# Patient Record
Sex: Male | Born: 1945 | Race: White | Hispanic: No | Marital: Married | Smoking: Former smoker
Health system: Southern US, Community
[De-identification: ages and names within clinical notes are randomized; demographics above are authoritative.]

## PROBLEM LIST (undated history)

## (undated) DIAGNOSIS — I10 Essential (primary) hypertension: Secondary | ICD-10-CM

## (undated) DIAGNOSIS — I629 Nontraumatic intracranial hemorrhage, unspecified: Secondary | ICD-10-CM

## (undated) DIAGNOSIS — D126 Benign neoplasm of colon, unspecified: Secondary | ICD-10-CM

## (undated) DIAGNOSIS — E7800 Pure hypercholesterolemia, unspecified: Secondary | ICD-10-CM

## (undated) DIAGNOSIS — I700 Atherosclerosis of aorta: Secondary | ICD-10-CM

## (undated) DIAGNOSIS — I639 Cerebral infarction, unspecified: Secondary | ICD-10-CM

## (undated) DIAGNOSIS — I701 Atherosclerosis of renal artery: Secondary | ICD-10-CM

## (undated) DIAGNOSIS — R931 Abnormal findings on diagnostic imaging of heart and coronary circulation: Secondary | ICD-10-CM

## (undated) HISTORY — PX: HERNIA REPAIR: SHX51

## (undated) HISTORY — PX: UMBILICAL HERNIA REPAIR: SHX196

## (undated) HISTORY — DX: Pure hypercholesterolemia, unspecified: E78.00

## (undated) HISTORY — DX: Atherosclerosis of renal artery: I70.1

## (undated) HISTORY — DX: Benign neoplasm of colon, unspecified: D12.6

## (undated) HISTORY — PX: APPENDECTOMY: SHX54

## (undated) HISTORY — DX: Abnormal findings on diagnostic imaging of heart and coronary circulation: R93.1

## (undated) HISTORY — DX: Essential (primary) hypertension: I10

## (undated) HISTORY — DX: Atherosclerosis of aorta: I70.0

---
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
## 2003-12-15 ENCOUNTER — Emergency Department (HOSPITAL_COMMUNITY): Admission: EM | Admit: 2003-12-15 | Discharge: 2003-12-16 | Payer: Self-pay | Admitting: Emergency Medicine

## 2005-12-17 ENCOUNTER — Ambulatory Visit: Payer: Self-pay | Admitting: Internal Medicine

## 2005-12-31 ENCOUNTER — Encounter (INDEPENDENT_AMBULATORY_CARE_PROVIDER_SITE_OTHER): Payer: Self-pay | Admitting: *Deleted

## 2005-12-31 ENCOUNTER — Ambulatory Visit: Payer: Self-pay | Admitting: Internal Medicine

## 2005-12-31 DIAGNOSIS — D126 Benign neoplasm of colon, unspecified: Secondary | ICD-10-CM

## 2005-12-31 HISTORY — PX: COLONOSCOPY W/ POLYPECTOMY: SHX1380

## 2005-12-31 HISTORY — DX: Benign neoplasm of colon, unspecified: D12.6

## 2008-04-28 ENCOUNTER — Ambulatory Visit: Payer: Self-pay | Admitting: Cardiology

## 2008-05-05 ENCOUNTER — Encounter: Payer: Self-pay | Admitting: Cardiology

## 2008-05-05 ENCOUNTER — Ambulatory Visit: Payer: Self-pay

## 2008-12-22 ENCOUNTER — Encounter (INDEPENDENT_AMBULATORY_CARE_PROVIDER_SITE_OTHER): Payer: Self-pay | Admitting: *Deleted

## 2009-08-05 ENCOUNTER — Telehealth (INDEPENDENT_AMBULATORY_CARE_PROVIDER_SITE_OTHER): Payer: Self-pay | Admitting: *Deleted

## 2010-04-26 NOTE — Progress Notes (Signed)
Summary: Schedule recall colonoscopy  Phone Note Outgoing Call Call back at Home Phone [phone]   Call placed by: Christie Nottingham CMA Duncan Dull),  Aug 05, 2009 11:23 AM Call placed to: Patient Summary of Call: Called pt to schedule recall colonoscopy and pt's mail box voicemail is full and could not leave a message and pt did not answer.  Initial call taken by: Christie Nottingham CMA Duncan Dull),  Aug 05, 2009 11:24 AM  Follow-up for Phone Call        no answer and voicemail box is full.  Follow-up by: Christie Nottingham CMA Duncan Dull),  Aug 12, 2009 10:19 AM

## 2010-08-08 NOTE — Assessment & Plan Note (Signed)
[facility]                            CARDIOLOGY OFFICE NOTE   NAME:MILLSKegan, Mckeithan                          MRN:          [id number]  DATE:04/28/2008                            DOB:          1946/02/23    PRIMARY CARE PHYSICIAN:  Mary-Margaret Daphine Deutscher, Nurse Practitioner.   REASON FOR PRESENTATION:  Evaluate the patient with an abnormal exercise  treadmill test.   HISTORY OF PRESENT ILLNESS:  The patient is a pleasant 65 year old  gentleman with cardiovascular risk factors but no prior cardiac disease.  He was screened with a routine exercise treadmill test recently.  He was  able to go for 8 minutes and 21 seconds with a heart rate of 140.  He  had an accelerated blood pressure response with a maximum 200/80.  He  did have inferior ST depression leads II, III, and aVF of about 1-2 mm.  It was horizontal.  I would interpret this as a positive study.  It  resolved fairly quickly in recovery.  This was a positive adequate  trial.  He did not have any chest pain with this.   He states he is active around his yard.  He caries heavy objects.  He  chases bird dogs.  With this level of activity, he denies any chest  pressure, neck or arm discomfort.  He does not have any palpitations,  presyncope, or syncope.  He has had no PND or orthopnea.  He does not  exercise routinely.   PAST MEDICAL HISTORY:  1. Hypertension, recently diagnosed.  2. Hyperlipidemia x10 years.  3. Small hemorrhoids.  4. Diverticulosis.   PAST SURGICAL HISTORY:  1. Appendectomy.  2. Umbilical hernia surgery as a child.   ALLERGIES:  None.   MEDICATIONS:  1. Lipitor 20 mg daily.  2. Lisinopril 10 mg daily.  3. Vitamin E.  4. Aspirin 81 mg daily.  5. Omega 3.  6. Flaxseed oil.   SOCIAL HISTORY:  The patient is retired.  He is married.  He has 2  children.  He does drink social alcohol.  He was a one-pack-per-day  smoker for 14 years, but quit 30 years ago.   FAMILY  HISTORY:  Noncontributory for early coronary artery disease.  Both of his parents died in their 13s.   REVIEW OF SYSTEMS:  As stated in the HPI, and positive for some mild  joint pains.  Negative for all other systems.   PHYSICAL EXAMINATION:  GENERAL:  The patient is pleasant and in no  distress.  VITAL SIGNS:  Blood pressure 130/80, heart rate 67 and regular, weight  173 pounds, and body mass index 26.  HEENT:  Eyelids unremarkable; pupils equal, round, and react to light;  fundi not well visualized, oral mucosa unremarkable.  NECK:  No jugular venous distention at 45 degrees; carotid upstroke  brisk and symmetric; no bruits, no thyromegaly.  LYMPHATICS:  No cervical, axillary, or inguinal adenopathy.  LUNGS:  Clear to auscultation bilaterally.  BACK:  No costovertebral angle tenderness.  CHEST:  Unremarkable.  HEART:  PMI not displaced or sustained; S1  and S2 within normal limits;  no S3, no S4; 2/6 apical systolic murmur heard at the left lower sternal  border and radiating slightly at the aortic outflow tract, early  peaking, no diastolic murmurs.  ABDOMEN:  Flat; positive bowel sounds, normal in frequency and pitch; no  bruits, no rebound, no guarding; no midline pulsatile mass; no  hepatomegaly, no splenomegaly.  SKIN:  No rashes.  No nodules.  EXTREMITIES:  Pulses 2+ throughout; no edema, no cyanosis, no clubbing.  NEURO:  Oriented to person, place, and time; cranial nerves II through  XII grossly intact, motor grossly intact.   EKG; sinus rhythm, rate 65, axis within normal limits, intervals within  normal limits, early transition lead V2, minimal voltage criteria for  left ventricular hypertrophy, no acute ST-T wave changes.   ASSESSMENT AND PLAN:  1. Abnormal treadmill test.  The patient did have a positive treadmill      test.  This could have been related to the hypertensive response.      However, I cannot exclude obstructive coronary artery disease.       Therefore, he needs the added sensitivity of stress perfusion      imaging.  I do not think he needs a cardiac catheterization at this      point.  He and I discussed this.  He agrees to proceed and can have      an exercise Myoview.  2. Murmur.  The patient does have a murmur, which is probably some      mild aortic stenosis or sclerosis.  I would not suspect strongly      obstructive valvular disease, though he does have some voltage      criteria for hypertrophy with only a minimal history of      hypertension.  To exclude more significant aortic stenosis, he      should get an echocardiogram and we will arrange this.  3. Hypertension.  His blood pressure is controlled on the meds as      listed.  4. Dyslipidemia per Mary-Margaret Daphine Deutscher with a goal LDL less than 100      and HDL greater      than 40 being a very aggressive goal in this gentleman.  5. Followup.  We will see him based on the results of the above.     Rollene Rotunda, MD, [facility]  Electronically Signed    JH/MedQ  DD: 04/28/2008  DT: 04/29/2008  Job #: [id number]   cc:   Bennie Pierini, NP

## 2011-07-17 ENCOUNTER — Encounter: Payer: Self-pay | Admitting: Internal Medicine

## 2011-07-19 ENCOUNTER — Encounter: Payer: Self-pay | Admitting: Internal Medicine

## 2011-07-19 ENCOUNTER — Ambulatory Visit (INDEPENDENT_AMBULATORY_CARE_PROVIDER_SITE_OTHER): Payer: Medicare Other | Admitting: Internal Medicine

## 2011-07-19 VITALS — BP 130/72 | HR 52 | Ht 68.0 in | Wt 158.4 lb

## 2011-07-19 DIAGNOSIS — Z860100 Personal history of colon polyps, unspecified: Secondary | ICD-10-CM

## 2011-07-19 DIAGNOSIS — K629 Disease of anus and rectum, unspecified: Secondary | ICD-10-CM

## 2011-07-19 DIAGNOSIS — K6289 Other specified diseases of anus and rectum: Secondary | ICD-10-CM

## 2011-07-19 DIAGNOSIS — Z1211 Encounter for screening for malignant neoplasm of colon: Secondary | ICD-10-CM

## 2011-07-19 MED ORDER — PEG-KCL-NACL-NASULF-NA ASC-C 100 G PO SOLR: 1.0000 | Freq: Once | ORAL | Status: DC

## 2011-07-19 NOTE — Progress Notes (Signed)
Subjective:    Patient ID: Peter York, male    DOB: 09-21-1945, 66 y.o.   MRN: [id number]  Referred by: Bennie Pierini, NP  HPI This 66 year old white man presents for evaluation of a nodule in the perianal area. 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 He gets a bed sore but is not very painful. He says his in the left buttock area. He was evaluated by primary care and sent here. They also palpated a nontender nodular area in the rectal area with Hemoccult-negative stool. He has noted no discharge from this. There is no change in bowel habits. No bleeding. Most of the time it does not bother him. He does think that it is probably growing.  He does have a history of adenomatous colon polyps in 2007. He does recall receiving a letter for followup but has not yet done so. No Known Allergies No outpatient prescriptions prior to visit.   Past Medical History  Diagnosis Date  . Adenomatous colon polyp 12/31/2005  . Diabetes mellitus   . Hypercholesterolemia   . Hypertension    Past Surgical History  Procedure Date  . Colonoscopy w/ polypectomy 12/31/2005    Dr. Stan Head  . Appendectomy   . Umbilical hernia repair    History   Social History  . Marital Status: Married    Spouse Name: N/A    Number of Children: 3  .     Occupational History  . retired    Social History Main Topics  . Smoking status: Former Games developer  . Smokeless tobacco: Never Used  . Alcohol Use: Yes     socially  . Drug Use: No  . Sexually Active: None     Family History  Problem Relation Age of Onset  . Colon cancer Father 61  . Colon polyps Father   . Diabetes Daughter   . Heart attack Maternal Uncle     X 4        Review of Systems Positive for those things mentioned above. He does wear eyeglasses. All other review of systems are negative.    Objective:   Physical Exam General:  Well-developed,  well-nourished and in no acute distress Eyes:  anicteric. ENT:   Mouth and posterior pharynx free of lesions.  Neck:   supple w/o thyromegaly or mass.  Lungs: Clear to auscultation bilaterally. Heart:  S1S2, no rubs, murmurs, gallops. Abdomen:  soft, non-tender, no hepatosplenomegaly, hernia, or mass and BS+.  Rectal: Anoderm - slight erythema and some anal tags. In left buttock at about 4-5 o'clock in left lateral there is about a 1-2 cm rubbery and nontender subcutaneous lesion. Somewhat mobile. No    fistulous openings seen. No fluctuance. DRE without mas, there is brown stool, normal resting tone. Normal prostate. Lymph:  no cervical or supraclavicular adenopathy. Extremities:   no edema Skin   no rash. Neuro:  A&O x 3.  Psych:  appropriate mood and  Affect.   Data Reviewed: Previous colonoscopy and pathology reports. 04/25/2011 metabolic panel, basic showing a glucose 149 otherwise normal. Liver function test 04/23/2011 with albumin 3.2 total protein 5.5 otherwise normal. His potassium was 6 that day glucose 144 on the remainder of the metabolic panel. Office note reviewed from 07/17/2011.       Assessment & Plan:   1. Perianal lesion   Submucosal lesion, unclear etiology. This is new, he thinks is growing so  it needs further evaluation. I will refer him for surgical evaluation, he could need imaging with MR versus resection.   2. Personal history of colonic polyps   3 adenomatous polyps removed 2007. A fourth polyp was not recovered. We'll schedule repeat colonoscopy for screening and surveillance. The risks and benefits as well as alternatives of endoscopic procedure(s) have been discussed and reviewed. All questions answered. The patient agrees to proceed.   3. Special screening for malignant neoplasms, colon    His elderly father, had colon cancer age 40.  I appreciate the opportunity to care for this patient.   CC: Bennie Pierini, NP

## 2011-07-19 NOTE — Patient Instructions (Signed)
You have been scheduled for a colonoscopy. Please follow written instructions given to you at your visit today.  Please pick up your prep kit at the pharmacy within the next 1-3 days.  You have been scheduled for an appointment with ____________ at [facility]. Your appointment is on _______ at ____. Please arrive at __________ for registration. Make certain to bring a list of current medications, including any over the counter medications or vitamins. Also bring your co-pay if you have one as well as your insurance cards. [facility] is located at [address], [address]. Should you need to reschedule your appointment, please contact them at [phone].

## 2011-07-20 ENCOUNTER — Encounter: Payer: Self-pay | Admitting: Internal Medicine

## 2011-08-10 ENCOUNTER — Ambulatory Visit (INDEPENDENT_AMBULATORY_CARE_PROVIDER_SITE_OTHER): Payer: Medicare Other | Admitting: Surgery

## 2011-08-14 ENCOUNTER — Encounter: Payer: Self-pay | Admitting: Internal Medicine

## 2011-08-14 ENCOUNTER — Ambulatory Visit (AMBULATORY_SURGERY_CENTER): Payer: Medicare Other | Admitting: Internal Medicine

## 2011-08-14 VITALS — BP 143/84 | HR 58 | Temp 97.4°F | Resp 16 | Ht 68.0 in | Wt 158.0 lb

## 2011-08-14 DIAGNOSIS — K6289 Other specified diseases of anus and rectum: Secondary | ICD-10-CM

## 2011-08-14 DIAGNOSIS — D126 Benign neoplasm of colon, unspecified: Secondary | ICD-10-CM

## 2011-08-14 DIAGNOSIS — Z860100 Personal history of colon polyps, unspecified: Secondary | ICD-10-CM

## 2011-08-14 DIAGNOSIS — Z1211 Encounter for screening for malignant neoplasm of colon: Secondary | ICD-10-CM

## 2011-08-14 MED ORDER — SODIUM CHLORIDE 0.9 % IV SOLN: 500.0000 mL | INTRAVENOUS | Status: DC

## 2011-08-14 NOTE — Patient Instructions (Signed)

## 2011-08-14 NOTE — Progress Notes (Signed)
Patient did not experience any of the following events: a burn prior to discharge; a fall within the facility; wrong site/side/patient/procedure/implant event; or a hospital transfer or hospital admission upon discharge from the facility. (G8907) Patient did not have preoperative order for IV antibiotic SSI prophylaxis. (G8918)  

## 2011-08-14 NOTE — Op Note (Signed)
[facility] [address] [place], [place]  [address]  COLONOSCOPY PROCEDURE REPORT  PATIENT:  Peter York, Peter York  MR#:  [id number] BIRTHDATE:  March 04, 1946, 66 yrs. old  GENDER:  male ENDOSCOPIST:  Iva Boop, MD, [facility]  PROCEDURE DATE:  08/14/2011 PROCEDURE:  Colonoscopy with biopsy and snare polypectomy ASA CLASS:  Class II INDICATIONS:  surveillance and high-risk screening, history of pre-cancerous (adenomatous) colon polyps 3-4 adenomas 2007 index, largest 8 mm MEDICATIONS:   These medications were titrated to patient response per physician's verbal order, Fentanyl 50 mcg IV, Versed 7 mg IV  DESCRIPTION OF PROCEDURE:   After the risks benefits and alternatives of the procedure were thoroughly explained, informed consent was obtained.  Digital rectal exam was performed and revealed no rectal masses, normal prostate and a perianal lesion. Subcutaneous perianal nodule on left. Firm, non-tender, at about 7 o'clock in left lateral decubitus position. The LB CF-[id number] endoscope was introduced through the anus and advanced to the cecum, which was identified by both the appendix and ileocecal valve, without limitations.  The quality of the prep was excellent, using MoviPrep.  The instrument was then slowly withdrawn as the colon was fully examined. <<PROCEDUREIMAGES>>  FINDINGS:  Four polyps were found. Cecum 2-3 mm cold biopsy removal, cecum 8 mm snare cautery removal, hepatic flexure 10 mm snare cautery removal, transverse colon 7 mm snare cautery removal. All sent to pathology.  Moderate diverticulosis was found in the sigmoid colon.  This was otherwise a normal examination of the colon. Includes right colon retroflexion.   Retroflexed views in the rectum revealed internal hemorrhoids.    The time to cecum = 3:20 minutes. The scope was then withdrawn in 19:16 minutes from the cecum and the procedure completed. COMPLICATIONS:  None ENDOSCOPIC IMPRESSION: 1) Four  polyps removed, largest 10mm 2) Moderate diverticulosis in the sigmoid colon 3) Internal hemorrhoids 4) Otherwise normal examination, excellent prep 5) Perianal submucosal nodule RECOMMENDATIONS: 1) Hold aspirin, aspirin products, and anti-inflammatory medication for 2 weeks. 2) Await Dr. Rosezena Sensor evaluation of perianal lesion REPEAT EXAM:  In for Colonoscopy, pending biopsy results.  Iva Boop, MD, [facility]  CC:  Bennie Pierini, NP and The Patient  n. eSIGNED:   Iva Boop at 08/14/2011 02:51 PM  Melina Fiddler, [id number]

## 2011-08-15 ENCOUNTER — Telehealth: Payer: Self-pay | Admitting: *Deleted

## 2011-08-15 NOTE — Telephone Encounter (Signed)
  Follow up Call-  Call back number 08/14/2011  Post procedure Call Back phone  # [phone]  Permission to leave phone message Yes     Patient questions:  Do you have a fever, pain , or abdominal swelling? no Pain Score  0 *  Have you tolerated food without any problems? yes  Have you been able to return to your normal activities? yes  Do you have any questions about your discharge instructions: Diet   no Medications  no Follow up visit  no  Do you have questions or concerns about your Care? no  Actions: * If pain score is 4 or above: No action needed, pain <4.

## 2011-08-16 ENCOUNTER — Encounter (INDEPENDENT_AMBULATORY_CARE_PROVIDER_SITE_OTHER): Payer: Self-pay | Admitting: Surgery

## 2011-08-16 ENCOUNTER — Ambulatory Visit (INDEPENDENT_AMBULATORY_CARE_PROVIDER_SITE_OTHER): Payer: Medicare Other | Admitting: Surgery

## 2011-08-16 VITALS — BP 102/62 | HR 60 | Temp 97.6°F | Resp 14 | Ht 68.0 in | Wt 155.2 lb

## 2011-08-16 DIAGNOSIS — R198 Other specified symptoms and signs involving the digestive system and abdomen: Secondary | ICD-10-CM

## 2011-08-16 DIAGNOSIS — K6289 Other specified diseases of anus and rectum: Secondary | ICD-10-CM

## 2011-08-16 NOTE — Progress Notes (Signed)
Patient ID: Peter York, male   DOB: 1945-08-08, 66 y.o.   MRN: [id number]  Chief Complaint  Patient presents with  . New Evaluation    New Pt. Eval of Pre-Anal Lesions    HPI Peter York is a 66 y.o. male.  Asked to see pt at request of Dr Leone Payor due to anal mass.  Present for 2 months.  No redness or drainage.  Getting a little larger.  No rectal discharge.   HPI  Past Medical History  Diagnosis Date  . Adenomatous colon polyp 12/31/2005  . Diabetes mellitus   . Hypercholesterolemia   . Hypertension     Past Surgical History  Procedure Date  . Colonoscopy w/ polypectomy 12/31/2005    Dr. Stan Head  . Appendectomy   . Umbilical hernia repair     Family History  Problem Relation Age of Onset  . Colon cancer Father 8  . Colon polyps Father   . Cancer Father     Colon  . Diabetes Daughter   . Heart attack Maternal Uncle     X 4  . Heart disease Maternal Uncle   . Heart disease Maternal Uncle   . Heart disease Maternal Uncle     Social History History  Substance Use Topics  . Smoking status: Former Smoker    Quit date: 03/26/1978  . Smokeless tobacco: Never Used  . Alcohol Use: Yes     socially    No Known Allergies  Current Outpatient Prescriptions  Medication Sig Dispense Refill  . AMBULATORY NON FORMULARY MEDICATION Beta Prostate  Take two capsule by mouth twice a day      . aspirin 81 MG tablet Take 81 mg by mouth daily.      . benazepril (LOTENSIN) 40 MG tablet Take 40 mg by mouth daily.      . furosemide (LASIX) 20 MG tablet Take 20 mg by mouth daily.      . metFORMIN (GLUCOPHAGE) 500 MG tablet Take one tablet by mouth in the morning and take two tablets by mouth at bedtime.      . rosuvastatin (CRESTOR) 20 MG tablet Take 20 mg by mouth daily.      . vitamin E 400 UNIT capsule Take 400 Units by mouth daily.        Review of Systems Review of Systems  Constitutional: Negative for fever, chills and unexpected weight change.  HENT: Negative  for hearing loss, congestion, sore throat, trouble swallowing and voice change.   Eyes: Negative for visual disturbance.  Respiratory: Negative for cough and wheezing.   Cardiovascular: Negative for chest pain, palpitations and leg swelling.  Gastrointestinal: Negative for nausea, vomiting, abdominal pain, diarrhea, constipation, blood in stool, abdominal distention, anal bleeding and rectal pain.  Genitourinary: Negative for hematuria and difficulty urinating.  Musculoskeletal: Negative for arthralgias.  Skin: Negative for rash and wound.  Neurological: Negative for seizures, syncope, weakness and headaches.  Hematological: Negative for adenopathy. Does not bruise/bleed easily.  Psychiatric/Behavioral: Negative for confusion.    Blood pressure 102/62, pulse 60, temperature 97.6 F (36.4 C), temperature source Temporal, resp. rate 14, height 5\' 8"  (1.727 m), weight 155 lb 3.2 oz (70.398 kg).  Physical Exam Physical Exam  Constitutional: He is oriented to person, place, and time. He appears well-developed and well-nourished.  HENT:  Head: Normocephalic and atraumatic.  Eyes: EOM are normal. Pupils are equal, round, and reactive to light.  Neck: Normal range of motion. Neck supple.  Cardiovascular: Normal  rate and regular rhythm.   Pulmonary/Chest: Effort normal and breath sounds normal.  Abdominal: Soft. Bowel sounds are normal.  Genitourinary:     Neurological: He is alert and oriented to person, place, and time.  Skin: Skin is warm and dry.  Psychiatric: He has a normal mood and affect. His behavior is normal. Judgment and thought content normal.    Data Reviewed Dr Leone Payor note  Assessment    Perianal mass 3cm    Plan    Excision of mass. The procedure has been discussed with the patient.  Alternative therapies have been discussed with the patient.  Operative risks include bleeding,  Infection,  Organ injury,  Nerve injury,  Blood vessel injury,  DVT,  Pulmonary  embolism,  Death,  And possible reoperation.  Medical management risks include worsening of present situation.  The success of the procedure is 50 -90 % at treating patients symptoms.  The patient understands and agrees to proceed.       Jenai Scaletta A. 08/16/2011, 11:05 AM

## 2011-08-16 NOTE — Patient Instructions (Signed)
[facility] _______[facility] ° °RECTAL SURGERY POST OP INSTRUCTIONS: POST OP INSTRUCTIONS ° °Always review your discharge instruction sheet given to you by the facility where your surgery was performed. °IF YOU HAVE DISABILITY OR FAMILY LEAVE FORMS, YOU MUST BRING THEM TO THE OFFICE FOR PROCESSING.   °DO NOT GIVE THEM TO YOUR DOCTOR. ° °1. A  prescription for pain medication may be given to you upon discharge.  Take your pain medication as prescribed, if needed.  If narcotic pain medicine is not needed, then you may take acetaminophen (Tylenol) or ibuprofen (Advil) as needed. °2. Take your usually prescribed medications unless otherwise directed. °3. If you need a refill on your pain medication, please contact your pharmacy.  They will contact our office to request authorization. Prescriptions will not be filled after 5 pm or on week-ends. °4. You should follow a light diet the first 48 hours after arrival home, such as soup and crackers, etc.  Be sure to include lots of fluids daily.  Resume your normal diet 2-3 days after surgery.. °5. Most patients will experience some swelling and discomfort in the rectal area. Ice packs, reclining and warm tub soaks will help.  Swelling and discomfort can take several days to resolve.  °6. It is common to experience some constipation if taking pain medication after surgery.  Increasing fluid intake and taking a stool softener (such as Colace) will usually help or prevent this problem from occurring.  A mild laxative (Milk of Magnesia or Miralax) should be taken according to package directions if there are no bowel movements after 48 hours. °7. Unless discharge instructions indicate otherwise, leave your bandage dry and in place for 24 hours, or remove the bandage if you have a bowel movement. You may notice a small amount of bleeding with bowel movements for the first few days. You may have some packing in the rectum which will come out over the first day or two. You  will need to wear an absorbent pad or soft cotton gauze in your underwear until the drainage stops.it. °8. ACTIVITIES:  You may resume regular (light) daily activities beginning the next day--such as daily self-care, walking, climbing stairs--gradually increasing activities as tolerated.  You may have sexual intercourse when it is comfortable.  Refrain from any heavy lifting or straining until approved by your doctor. °a. You may drive when you are no longer taking prescription pain medication, you can comfortably wear a seatbelt, and you can safely maneuver your car and apply brakes. °b. RETURN TO WORK: : ____________________ °c.  °9. You should see your doctor in the office for a follow-up appointment approximately 2-3 weeks after your surgery.  Make sure that you call for this appointment within a day or two after you arrive home to insure a convenient appointment time. °10. OTHER INSTRUCTIONS:  __________________________________________________________________________________________________________________________________________________________________________________________  °WHEN TO CALL YOUR DOCTOR: °1. Fever over 101.0 °2. Inability to urinate °3. Nausea and/or vomiting °4. Extreme swelling or bruising °5. Continued bleeding from rectum. °6. Increased pain, redness, or drainage from the incision °7. Constipation ° °The clinic staff is available to answer your questions during regular business hours.  Please don’t hesitate to call and ask to speak to one of the nurses for clinical concerns.  If you have a medical emergency, go to the nearest emergency room or call 911.  A surgeon from [facility] is always on call at the hospital ° ° °[address], [address], [place], [place]  [address] ? °   [address] [address], [place], [place]   [address] °[phone] ? [phone] ? FAX [phone] °Web site: [email or web address] ° °

## 2011-08-22 ENCOUNTER — Encounter: Payer: Self-pay | Admitting: Internal Medicine

## 2011-08-22 NOTE — Progress Notes (Signed)
Quick Note:  3 adenomas largest 1 cm Repeat colonoscopy about 07/2014 ______

## 2011-09-26 NOTE — Progress Notes (Signed)
To come in for cxr-ekg,cmet Had an abn stress test-sent to see dr hochrein 5/12-stress and echo normal-no further workup

## 2011-10-01 ENCOUNTER — Encounter (HOSPITAL_BASED_OUTPATIENT_CLINIC_OR_DEPARTMENT_OTHER)
Admission: RE | Admit: 2011-10-01 | Discharge: 2011-10-01 | Disposition: A | Discharge status: Home / Self Care | Payer: Medicare Other | Source: Ambulatory Visit | Attending: Surgery | Admitting: Surgery

## 2011-10-01 ENCOUNTER — Ambulatory Visit
Admission: RE | Admit: 2011-10-01 | Discharge: 2011-10-01 | Disposition: A | Discharge status: Home / Self Care | Payer: Medicare Other | Source: Ambulatory Visit | Attending: Surgery | Admitting: Surgery

## 2011-10-01 LAB — COMPREHENSIVE METABOLIC PANEL
Albumin: 3.1 g/dL — ABNORMAL LOW (ref 3.5–5.2)
Alkaline Phosphatase: 57 U/L (ref 39–117)
BUN: 16 mg/dL (ref 6–23)
CO2: 30 mEq/L (ref 19–32)
Chloride: 101 mEq/L (ref 96–112)
Creatinine, Ser: 1.05 mg/dL (ref 0.50–1.35)
GFR calc Af Amer: 83 mL/min — ABNORMAL LOW (ref >90)
GFR calc non Af Amer: 72 mL/min — ABNORMAL LOW (ref >90)
Glucose, Bld: 204 mg/dL — ABNORMAL HIGH (ref 70–99)
Potassium: 4.3 mEq/L (ref 3.5–5.1)
Total Bilirubin: 0.5 mg/dL (ref 0.3–1.2)

## 2011-10-02 ENCOUNTER — Ambulatory Visit (HOSPITAL_BASED_OUTPATIENT_CLINIC_OR_DEPARTMENT_OTHER): Payer: Medicare Other | Admitting: Anesthesiology

## 2011-10-02 ENCOUNTER — Encounter (HOSPITAL_BASED_OUTPATIENT_CLINIC_OR_DEPARTMENT_OTHER): Payer: Self-pay | Admitting: Anesthesiology

## 2011-10-02 ENCOUNTER — Ambulatory Visit (HOSPITAL_BASED_OUTPATIENT_CLINIC_OR_DEPARTMENT_OTHER)
Admission: RE | Admit: 2011-10-02 | Discharge: 2011-10-02 | Disposition: A | Discharge status: Home / Self Care | Payer: Medicare Other | Source: Ambulatory Visit | Attending: Surgery | Admitting: Surgery

## 2011-10-02 ENCOUNTER — Encounter (HOSPITAL_BASED_OUTPATIENT_CLINIC_OR_DEPARTMENT_OTHER)
Admission: RE | Disposition: A | Discharge status: Home / Self Care | Payer: Self-pay | Source: Ambulatory Visit | Attending: Surgery

## 2011-10-02 DIAGNOSIS — D128 Benign neoplasm of rectum: Secondary | ICD-10-CM

## 2011-10-02 DIAGNOSIS — E119 Type 2 diabetes mellitus without complications: Secondary | ICD-10-CM | POA: Insufficient documentation

## 2011-10-02 DIAGNOSIS — E7800 Pure hypercholesterolemia, unspecified: Secondary | ICD-10-CM | POA: Insufficient documentation

## 2011-10-02 DIAGNOSIS — K6289 Other specified diseases of anus and rectum: Secondary | ICD-10-CM | POA: Insufficient documentation

## 2011-10-02 DIAGNOSIS — Z860100 Personal history of colon polyps, unspecified: Secondary | ICD-10-CM | POA: Insufficient documentation

## 2011-10-02 DIAGNOSIS — I10 Essential (primary) hypertension: Secondary | ICD-10-CM | POA: Insufficient documentation

## 2011-10-02 DIAGNOSIS — D129 Benign neoplasm of anus and anal canal: Secondary | ICD-10-CM

## 2011-10-02 DIAGNOSIS — Z01812 Encounter for preprocedural laboratory examination: Secondary | ICD-10-CM | POA: Insufficient documentation

## 2011-10-02 HISTORY — PX: ANAL FISTULECTOMY: SHX1139

## 2011-10-02 LAB — GLUCOSE, CAPILLARY
Glucose-Capillary: 127 mg/dL — ABNORMAL HIGH (ref 70–99)
Glucose-Capillary: 98 mg/dL (ref 70–99)

## 2011-10-02 LAB — POCT HEMOGLOBIN-HEMACUE: Hemoglobin: 12.1 g/dL — ABNORMAL LOW (ref 13.0–17.0)

## 2011-10-02 SURGERY — FISTULECTOMY, ANAL
Anesthesia: General | Site: Anus | Wound class: Contaminated

## 2011-10-02 MED ORDER — OXYCODONE-ACETAMINOPHEN 5-325 MG PO TABS: 1.0000 | ORAL_TABLET | ORAL | Status: AC | PRN

## 2011-10-02 MED ORDER — ONDANSETRON HCL 4 MG/2ML IJ SOLN
INTRAMUSCULAR | Status: DC | PRN
  Administered 2011-10-02: 4 mg via INTRAVENOUS

## 2011-10-02 MED ORDER — PROPOFOL 10 MG/ML IV EMUL
INTRAVENOUS | Status: DC | PRN
  Administered 2011-10-02: 200 mg via INTRAVENOUS

## 2011-10-02 MED ORDER — FENTANYL CITRATE 0.05 MG/ML IJ SOLN
INTRAMUSCULAR | Status: DC | PRN
  Administered 2011-10-02 (×2): 50 ug via INTRAVENOUS

## 2011-10-02 MED ORDER — GLYCOPYRROLATE 0.2 MG/ML IJ SOLN
INTRAMUSCULAR | Status: DC | PRN
  Administered 2011-10-02: 0.2 mg via INTRAVENOUS

## 2011-10-02 MED ORDER — SODIUM CHLORIDE 0.9 % IJ SOLN
INTRAMUSCULAR | Status: DC | PRN
  Administered 2011-10-02: 10 mL via INTRAVENOUS

## 2011-10-02 MED ORDER — OXYCODONE HCL 5 MG PO TABS: 5.0000 mg | ORAL_TABLET | Freq: Once | ORAL | Status: DC | PRN

## 2011-10-02 MED ORDER — CEFAZOLIN SODIUM-DEXTROSE 2-3 GM-% IV SOLR
2.0000 g | INTRAVENOUS | Status: AC
  Administered 2011-10-02: 2 g via INTRAVENOUS

## 2011-10-02 MED ORDER — HYDROMORPHONE HCL PF 1 MG/ML IJ SOLN: 0.2500 mg | INTRAMUSCULAR | Status: DC | PRN

## 2011-10-02 MED ORDER — BUPIVACAINE LIPOSOME 1.3 % IJ SUSP
INTRAMUSCULAR | Status: DC | PRN
  Administered 2011-10-02: 10 mL

## 2011-10-02 MED ORDER — METOCLOPRAMIDE HCL 5 MG/ML IJ SOLN: 10.0000 mg | Freq: Once | INTRAMUSCULAR | Status: DC | PRN

## 2011-10-02 MED ORDER — DEXAMETHASONE SODIUM PHOSPHATE 4 MG/ML IJ SOLN
INTRAMUSCULAR | Status: DC | PRN
  Administered 2011-10-02: 4 mg via INTRAVENOUS

## 2011-10-02 MED ORDER — EPHEDRINE SULFATE 50 MG/ML IJ SOLN
INTRAMUSCULAR | Status: DC | PRN
  Administered 2011-10-02: 10 mg via INTRAVENOUS

## 2011-10-02 MED ORDER — LIDOCAINE HCL (CARDIAC) 20 MG/ML IV SOLN
INTRAVENOUS | Status: DC | PRN
  Administered 2011-10-02: 60 mg via INTRAVENOUS

## 2011-10-02 MED ORDER — MIDAZOLAM HCL 5 MG/5ML IJ SOLN
INTRAMUSCULAR | Status: DC | PRN
  Administered 2011-10-02: 2 mg via INTRAVENOUS

## 2011-10-02 MED ORDER — ACETAMINOPHEN 10 MG/ML IV SOLN: 1000.0000 mg | Freq: Once | INTRAVENOUS | Status: DC

## 2011-10-02 MED ORDER — METOCLOPRAMIDE HCL 5 MG/ML IJ SOLN
INTRAMUSCULAR | Status: DC | PRN
  Administered 2011-10-02: 10 mg via INTRAVENOUS

## 2011-10-02 MED ORDER — LACTATED RINGERS IV SOLN
INTRAVENOUS | Status: DC
  Administered 2011-10-02 (×3): via INTRAVENOUS

## 2011-10-02 SURGICAL SUPPLY — 38 items
BLADE SURG 15 STRL LF DISP TIS (BLADE) ×1 IMPLANT
BLADE SURG 15 STRL SS (BLADE) ×2
BRIEF STRETCH FOR OB PAD LRG (UNDERPADS AND DIAPERS) ×1 IMPLANT
CANISTER SUCTION 1200CC (MISCELLANEOUS) ×2 IMPLANT
CLOTH BEACON ORANGE TIMEOUT ST (SAFETY) ×2 IMPLANT
COVER MAYO STAND STRL (DRAPES) IMPLANT
DECANTER SPIKE VIAL GLASS SM (MISCELLANEOUS) ×1 IMPLANT
DRAPE UTILITY XL STRL (DRAPES) ×2 IMPLANT
DRSG PAD ABDOMINAL 8X10 ST (GAUZE/BANDAGES/DRESSINGS) ×2 IMPLANT
ELECT REM PT RETURN 9FT ADLT (ELECTROSURGICAL) ×2
ELECTRODE REM PT RTRN 9FT ADLT (ELECTROSURGICAL) IMPLANT
GAUZE PACKING IODOFORM 1/4X5 (PACKING) ×1 IMPLANT
GAUZE SPONGE 4X4 12PLY STRL LF (GAUZE/BANDAGES/DRESSINGS) ×2 IMPLANT
GLOVE BIO SURGEON STRL SZ7 (GLOVE) ×1 IMPLANT
GLOVE BIOGEL PI IND STRL 7.0 (GLOVE) IMPLANT
GLOVE BIOGEL PI IND STRL 8 (GLOVE) ×1 IMPLANT
GLOVE BIOGEL PI INDICATOR 7.0 (GLOVE) ×1
GLOVE BIOGEL PI INDICATOR 8 (GLOVE) ×1
GLOVE ECLIPSE 8.0 STRL XLNG CF (GLOVE) ×2 IMPLANT
GOWN PREVENTION PLUS XLARGE (GOWN DISPOSABLE) ×3 IMPLANT
NDL HYPO 25X1 1.5 SAFETY (NEEDLE) ×1 IMPLANT
NEEDLE HYPO 25X1 1.5 SAFETY (NEEDLE) ×2 IMPLANT
PACK BASIN DAY SURGERY FS (CUSTOM PROCEDURE TRAY) ×2 IMPLANT
PACK LITHOTOMY IV (CUSTOM PROCEDURE TRAY) ×2 IMPLANT
PENCIL BUTTON HOLSTER BLD 10FT (ELECTRODE) ×1 IMPLANT
SHEET MEDIUM DRAPE 40X70 STRL (DRAPES) IMPLANT
SPONGE SURGIFOAM ABS GEL 12-7 (HEMOSTASIS) IMPLANT
SURGILUBE 2OZ TUBE FLIPTOP (MISCELLANEOUS) ×2 IMPLANT
SUT CHROMIC 3 0 SH 27 (SUTURE) IMPLANT
SYR CONTROL 10ML LL (SYRINGE) ×2 IMPLANT
TOWEL OR 17X24 6PK STRL BLUE (TOWEL DISPOSABLE) ×3 IMPLANT
TOWEL OR NON WOVEN STRL DISP B (DISPOSABLE) ×2 IMPLANT
TRAY DSU PREP LF (CUSTOM PROCEDURE TRAY) ×2 IMPLANT
TRAY PROCTOSCOPIC FIBER OPTIC (SET/KITS/TRAYS/PACK) IMPLANT
TUBE CONNECTING 20X1/4 (TUBING) ×2 IMPLANT
UNDERPAD 30X30 INCONTINENT (UNDERPADS AND DIAPERS) ×2 IMPLANT
WATER STERILE IRR 1000ML POUR (IV SOLUTION) IMPLANT
YANKAUER SUCT BULB TIP NO VENT (SUCTIONS) ×2 IMPLANT

## 2011-10-02 NOTE — Transfer of Care (Signed)
Immediate Anesthesia Transfer of Care Note  Patient: Peter York  Procedure(s) Performed: Procedure(s) (LRB): FISTULECTOMY ANAL (N/A)  Patient Location: PACU  Anesthesia Type: General  Level of Consciousness: sedated  Airway & Oxygen Therapy: Patient Spontanous Breathing and Patient connected to face mask oxygen  Post-op Assessment: Report given to PACU RN and Post -op Vital signs reviewed and stable  Post vital signs: Reviewed and stable  Complications: No apparent anesthesia complications

## 2011-10-02 NOTE — Anesthesia Procedure Notes (Signed)
Procedure Name: LMA Insertion Date/Time: 10/02/2011 2:39 PM Performed by: Gar Gibbon Pre-anesthesia Checklist: Patient identified, Emergency Drugs available, Suction available and Patient being monitored Patient Re-evaluated:Patient Re-evaluated prior to inductionOxygen Delivery Method: Circle System Utilized Preoxygenation: Pre-oxygenation with 100% oxygen Intubation Type: IV induction Ventilation: Mask ventilation without difficulty LMA: LMA inserted LMA Size: 4.0 Number of attempts: 1 Airway Equipment and Method: bite block Placement Confirmation: positive ETCO2 Tube secured with: Tape Dental Injury: Teeth and Oropharynx as per pre-operative assessment

## 2011-10-02 NOTE — Anesthesia Postprocedure Evaluation (Signed)
Anesthesia Post Note  Patient: Peter York  Procedure(s) Performed: Procedure(s) (LRB): FISTULECTOMY ANAL (N/A)  Anesthesia type: General  Patient location: PACU  Post pain: Pain level controlled  Post assessment: Patient's Cardiovascular Status Stable  Last Vitals:  Filed Vitals:   10/02/11 1621  BP: 143/79  Pulse: 57  Temp: 36.3 C  Resp: 16    Post vital signs: Reviewed and stable  Level of consciousness: alert  Complications: No apparent anesthesia complications

## 2011-10-02 NOTE — H&P (Signed)
Peter York     Patient ID: Peter York, male   DOB: 08-Feb-1946, 65 y.o.   MRN: [id number]    Chief Complaint   Patient presents with   .  New Evaluation       New Pt. Eval of Pre-Anal Lesions      HPI Peter York is a 66 y.o. male.  Asked to see pt at request of Dr Leone Payor due to anal mass.  Present for 2 months.  No redness or drainage.  Getting a little larger.  No rectal discharge.   HPI    Past Medical History   Diagnosis  Date   .  Adenomatous colon polyp  12/31/2005   .  Diabetes mellitus     .  Hypercholesterolemia     .  Hypertension         Past Surgical History   Procedure  Date   .  Colonoscopy w/ polypectomy  12/31/2005       Dr. Stan Head   .  Appendectomy     .  Umbilical hernia repair         Family History   Problem  Relation  Age of Onset   .  Colon cancer  Father  47   .  Colon polyps  Father     .  Cancer  Father         Colon   .  Diabetes  Daughter     .  Heart attack  Maternal Uncle         X 4   .  Heart disease  Maternal Uncle     .  Heart disease  Maternal Uncle     .  Heart disease  Maternal Uncle        Social History History   Substance Use Topics   .  Smoking status:  Former Smoker       Quit date:  03/26/1978   .  Smokeless tobacco:  Never Used   .  Alcohol Use:  Yes         socially      No Known Allergies    Current Outpatient Prescriptions   Medication  Sig  Dispense  Refill   .  AMBULATORY NON FORMULARY MEDICATION  Beta Prostate    Take two capsule by mouth twice a day         .  aspirin 81 MG tablet  Take 81 mg by mouth daily.         .  benazepril (LOTENSIN) 40 MG tablet  Take 40 mg by mouth daily.         .  furosemide (LASIX) 20 MG tablet  Take 20 mg by mouth daily.         .  metFORMIN (GLUCOPHAGE) 500 MG tablet  Take one tablet by mouth in the morning and take two tablets by mouth at bedtime.         .  rosuvastatin (CRESTOR) 20 MG tablet  Take 20 mg by mouth daily.         .  vitamin E 400 UNIT  capsule  Take 400 Units by mouth daily.            Review of Systems Review of Systems  Constitutional: Negative for fever, chills and unexpected weight change.  HENT: Negative for hearing loss, congestion, sore throat, trouble swallowing and voice change.   Eyes: Negative for visual disturbance.  Respiratory: Negative for cough and wheezing.   Cardiovascular: Negative for chest pain, palpitations and leg swelling.  Gastrointestinal: Negative for nausea, vomiting, abdominal pain, diarrhea, constipation, blood in stool, abdominal distention, anal bleeding and rectal pain.  Genitourinary: Negative for hematuria and difficulty urinating.  Musculoskeletal: Negative for arthralgias.  Skin: Negative for rash and wound.  Neurological: Negative for seizures, syncope, weakness and headaches.  Hematological: Negative for adenopathy. Does not bruise/bleed easily.  Psychiatric/Behavioral: Negative for confusion.    Blood pressure 102/62, pulse 60, temperature 97.6 F (36.4 C), temperature source Temporal, resp. rate 14, height 5\' 8"  (1.727 m), weight 155 lb 3.2 oz (70.398 kg).   Physical Exam Physical Exam  Constitutional: He is oriented to person, place, and time. He appears well-developed and well-nourished.  HENT:   Head: Normocephalic and atraumatic.  Eyes: EOM are normal. Pupils are equal, round, and reactive to light.  Neck: Normal range of motion. Neck supple.  Cardiovascular: Normal rate and regular rhythm.   Pulmonary/Chest: Effort normal and breath sounds normal.  Abdominal: Soft. Bowel sounds are normal.  Genitourinary:     Neurological: He is alert and oriented to person, place, and time.  Skin: Skin is warm and dry.  Psychiatric: He has a normal mood and affect. His behavior is normal. Judgment and thought content normal.    Data Reviewed Dr Leone Payor note   Assessment Perianal mass 3cm   Plan Excision of mass. The procedure has been discussed with the patient.   Alternative therapies have been discussed with the patient.  Operative risks include bleeding,  Infection,  Organ injury,  Nerve injury,  Blood vessel injury,  DVT,  Pulmonary embolism,  Death,  And possible reoperation.  Medical management risks include worsening of present situation.  The success of the procedure is 50 -90 % at treating patients symptoms.  The patient understands and agrees to proceed.       Maralee Higuchi A.

## 2011-10-02 NOTE — Anesthesia Preprocedure Evaluation (Addendum)
Anesthesia Evaluation  Patient identified by MRN, date of birth, ID band Patient awake    Reviewed: Allergy & Precautions, H&P , NPO status , Patient's Chart, lab work & pertinent test results, reviewed documented beta blocker date and time   Airway Mallampati: II TM Distance: >3 FB Neck ROM: full    Dental   Pulmonary neg pulmonary ROS,          Cardiovascular hypertension, On Medications     Neuro/Psych negative neurological ROS  negative psych ROS   GI/Hepatic negative GI ROS, Neg liver ROS,   Endo/Other  Oral Hypoglycemic Agents  Renal/GU negative Renal ROS  negative genitourinary   Musculoskeletal   Abdominal   Peds  Hematology negative hematology ROS (+)   Anesthesia Other Findings See surgeon's H&P   Reproductive/Obstetrics negative OB ROS                           Anesthesia Physical Anesthesia Plan  ASA: II  Anesthesia Plan: General   Post-op Pain Management:    Induction: Intravenous  Airway Management Planned: LMA  Additional Equipment:   Intra-op Plan:   Post-operative Plan: Extubation in OR  Informed Consent: I have reviewed the patients History and Physical, chart, labs and discussed the procedure including the risks, benefits and alternatives for the proposed anesthesia with the patient or authorized representative who has indicated his/her understanding and acceptance.   Dental Advisory Given  Plan Discussed with: CRNA and Surgeon  Anesthesia Plan Comments:        Anesthesia Quick Evaluation

## 2011-10-02 NOTE — Op Note (Signed)
Preoperative diagnosis:  Anal verge mass  Postoperative diagnosis:  Same  Procedure :  Exam under anesthesia and excision of anal canal mass 3 cm  Surgeon:  Harriette Bouillon MD  Anesthesia LMA with 200 cc of Exparel mixed with 20  Cc NS  EBL:  Minimal  Specimen:  Anal canal mass  Indications for procedure:  Pt presents for excision of painful anal canal mass.  It is located at 3 oclock.  Risks benefits  And alternative procedures presented. He agrees to proceed.The procedure has been discussed with the patient.  Alternative therapies have been discussed with the patient.  Operative risks include bleeding,  Infection,  Organ injury,  Nerve injury, incontinence,    Blood vessel injury,  DVT,  Pulmonary embolism,  Death,  And possible reoperation.  Medical management risks include worsening of present situation.  The success of the procedure is 50 -90 % at treating patients symptoms.  The patient understands and agrees to proceed.  Description:  Pt met in holding area and questions answered.  He was placed supine on OR table and LMA anesthesia was initiated.  He was placed in lithotomy and anal canal and perineum prepped and draped in a sterile fashion. Time out done and antibiotics given.  Normal tone.  Normal anoscopy.  Mass at 3 oclock palpated.  Incision made and mass removed from the anal verge.  External sphincter seen but not injured.  Wound packed open with iodoform packing. Hemostasis achieved.   Pt taken out of lithotomy and extubated in satisfactory condition.   All counts correct.

## 2011-10-02 NOTE — Interval H&P Note (Signed)
History and Physical Interval Note:  10/02/2011 2:02 PM  Peter York  has presented today for surgery, with the diagnosis of perianal mass  The various methods of treatment have been discussed with the patient and family. After consideration of risks, benefits and other options for treatment, the patient has consented to  Procedure(s) (LRB): FISTULECTOMY ANAL (N/A) as a surgical intervention .  The patient's history has been reviewed, patient examined, no change in status, stable for surgery.  I have reviewed the patients' chart and labs.  Questions were answered to the patient's satisfaction.     Kasandra Fehr A.

## 2011-10-04 ENCOUNTER — Encounter (HOSPITAL_BASED_OUTPATIENT_CLINIC_OR_DEPARTMENT_OTHER): Payer: Self-pay | Admitting: Surgery

## 2011-10-08 ENCOUNTER — Encounter (HOSPITAL_BASED_OUTPATIENT_CLINIC_OR_DEPARTMENT_OTHER): Payer: Self-pay

## 2011-11-02 ENCOUNTER — Encounter (INDEPENDENT_AMBULATORY_CARE_PROVIDER_SITE_OTHER): Payer: Self-pay | Admitting: Surgery

## 2011-11-02 ENCOUNTER — Ambulatory Visit (INDEPENDENT_AMBULATORY_CARE_PROVIDER_SITE_OTHER): Payer: Medicare Other | Admitting: Surgery

## 2011-11-02 VITALS — BP 118/64 | HR 70 | Temp 97.3°F | Resp 14 | Ht 68.0 in | Wt 158.5 lb

## 2011-11-02 DIAGNOSIS — Z9889 Other specified postprocedural states: Secondary | ICD-10-CM

## 2011-11-02 NOTE — Patient Instructions (Signed)
Apply neosporin to wound daily. Wear padding when riding.  Return as needed.

## 2011-11-02 NOTE — Progress Notes (Signed)
Patient returns after excision of anal verge mass. Pathology showed chronic inflammation. There is no evidence of malignancy.  Exam: Perianal wound measures 2 cm in maximal diameter. It  is 5 mm the period is clean. No signs of infection.  Impression: Status post excision of anal verge mass consistent with chronic inflammation  Plan: Apply Neosporin daily. May resume activities tolerated. Return as needed

## 2012-07-07 ENCOUNTER — Other Ambulatory Visit: Payer: Self-pay | Admitting: *Deleted

## 2012-07-07 MED ORDER — FUROSEMIDE 40 MG PO TABS: 40.0000 mg | ORAL_TABLET | Freq: Two times a day (BID) | ORAL | Status: DC

## 2012-08-04 ENCOUNTER — Other Ambulatory Visit: Payer: Self-pay | Admitting: Nurse Practitioner

## 2012-08-05 NOTE — Telephone Encounter (Signed)
Patient last seen in office and had labwork on 04-04-12. Please advise

## 2012-08-07 ENCOUNTER — Other Ambulatory Visit: Payer: Self-pay

## 2012-08-07 MED ORDER — FENOFIBRATE 145 MG PO TABS: 145.0000 mg | ORAL_TABLET | Freq: Every day | ORAL | Status: DC

## 2012-09-02 ENCOUNTER — Other Ambulatory Visit: Payer: Self-pay | Admitting: Nurse Practitioner

## 2012-09-17 ENCOUNTER — Other Ambulatory Visit: Payer: Self-pay | Admitting: Nurse Practitioner

## 2012-10-01 ENCOUNTER — Other Ambulatory Visit: Payer: Self-pay | Admitting: Nurse Practitioner

## 2012-10-02 ENCOUNTER — Other Ambulatory Visit: Payer: Self-pay | Admitting: *Deleted

## 2012-10-02 ENCOUNTER — Other Ambulatory Visit (INDEPENDENT_AMBULATORY_CARE_PROVIDER_SITE_OTHER): Payer: Medicare Other

## 2012-10-02 DIAGNOSIS — E785 Hyperlipidemia, unspecified: Secondary | ICD-10-CM

## 2012-10-02 DIAGNOSIS — Z125 Encounter for screening for malignant neoplasm of prostate: Secondary | ICD-10-CM

## 2012-10-02 DIAGNOSIS — E119 Type 2 diabetes mellitus without complications: Secondary | ICD-10-CM

## 2012-10-02 DIAGNOSIS — I10 Essential (primary) hypertension: Secondary | ICD-10-CM

## 2012-10-02 LAB — COMPLETE METABOLIC PANEL WITH GFR
AST: 21 U/L (ref 0–37)
Albumin: 3.3 g/dL — ABNORMAL LOW (ref 3.5–5.2)
Alkaline Phosphatase: 35 U/L — ABNORMAL LOW (ref 39–117)
BUN: 30 mg/dL — ABNORMAL HIGH (ref 6–23)
Creat: 1.34 mg/dL (ref 0.50–1.35)
Potassium: 5.2 mEq/L (ref 3.5–5.3)
Total Bilirubin: 0.4 mg/dL (ref 0.3–1.2)

## 2012-10-02 LAB — PSA: PSA: 2.31 ng/mL (ref <=4.00)

## 2012-10-02 MED ORDER — METFORMIN HCL 500 MG PO TABS: ORAL_TABLET | ORAL | Status: DC

## 2012-10-02 NOTE — Progress Notes (Signed)
Patient came in for labs only.

## 2012-10-03 LAB — NMR LIPOPROFILE WITH LIPIDS
Cholesterol, Total: 165 mg/dL (ref <200)
HDL-C: 51 mg/dL (ref >=40)
LDL (calc): 95 mg/dL (ref <100)
LDL Particle Number: 1897 nmol/L — ABNORMAL HIGH (ref <1000)
LP-IR Score: 59 — ABNORMAL HIGH (ref <=45)
Triglycerides: 95 mg/dL (ref <150)
VLDL Size: 41.6 nm (ref <=46.6)

## 2012-10-06 ENCOUNTER — Encounter: Payer: Self-pay | Admitting: Nurse Practitioner

## 2012-10-06 ENCOUNTER — Ambulatory Visit (INDEPENDENT_AMBULATORY_CARE_PROVIDER_SITE_OTHER): Payer: Medicare Other | Admitting: Nurse Practitioner

## 2012-10-06 VITALS — BP 115/70 | HR 71 | Temp 97.4°F | Ht 67.0 in | Wt 159.0 lb

## 2012-10-06 DIAGNOSIS — E785 Hyperlipidemia, unspecified: Secondary | ICD-10-CM

## 2012-10-06 DIAGNOSIS — E782 Mixed hyperlipidemia: Secondary | ICD-10-CM | POA: Insufficient documentation

## 2012-10-06 DIAGNOSIS — E119 Type 2 diabetes mellitus without complications: Secondary | ICD-10-CM

## 2012-10-06 DIAGNOSIS — I10 Essential (primary) hypertension: Secondary | ICD-10-CM

## 2012-10-06 DIAGNOSIS — E1169 Type 2 diabetes mellitus with other specified complication: Secondary | ICD-10-CM | POA: Insufficient documentation

## 2012-10-06 MED ORDER — FENOFIBRATE 145 MG PO TABS: 145.0000 mg | ORAL_TABLET | Freq: Every day | ORAL | Status: DC

## 2012-10-06 MED ORDER — ROSUVASTATIN CALCIUM 20 MG PO TABS: 20.0000 mg | ORAL_TABLET | Freq: Every day | ORAL | Status: DC

## 2012-10-06 MED ORDER — BENAZEPRIL HCL 40 MG PO TABS: 40.0000 mg | ORAL_TABLET | Freq: Every day | ORAL | Status: DC

## 2012-10-06 MED ORDER — EZETIMIBE 10 MG PO TABS: 10.0000 mg | ORAL_TABLET | Freq: Every day | ORAL | Status: DC

## 2012-10-06 NOTE — Progress Notes (Signed)
Subjective:    Patient ID: NATASHA PAULSON, male    DOB: Jun 06, 1945, 67 y.o.   MRN: [id number]  Hypertension This is a chronic problem. The current episode started more than 1 year ago. The problem is unchanged. The problem is controlled. Pertinent negatives include no blurred vision, chest pain, headaches, neck pain, palpitations, peripheral edema or shortness of breath. There are no associated agents to hypertension. Risk factors for coronary artery disease include diabetes mellitus, dyslipidemia and male gender. Past treatments include ACE inhibitors and diuretics. The current treatment provides significant improvement. Compliance problems include diet and exercise.   Hyperlipidemia This is a chronic problem. The current episode started more than 1 year ago. The problem is controlled. Exacerbating diseases include diabetes. He has no history of hypothyroidism or obesity. There are no known factors aggravating his hyperlipidemia. Pertinent negatives include no chest pain or shortness of breath. Current antihyperlipidemic treatment includes statins, fibric acid derivatives and ezetimibe. The current treatment provides moderate improvement of lipids. Risk factors for coronary artery disease include diabetes mellitus, hypertension and male sex.  Diabetes He presents for his follow-up diabetic visit. He has type 2 diabetes mellitus. No MedicAlert identification noted. The initial diagnosis of diabetes was made 10 years ago. His disease course has been fluctuating. There are no hypoglycemic associated symptoms. Pertinent negatives for hypoglycemia include no headaches. Pertinent negatives for diabetes include no blurred vision, no chest pain, no polydipsia, no polyphagia, no polyuria, no visual change and no weight loss. There are no hypoglycemic complications. Symptoms are stable. There are no diabetic complications. Risk factors for coronary artery disease include dyslipidemia, hypertension and male sex.  Current diabetic treatment includes oral agent (monotherapy). He is compliant with treatment most of the time. His weight is stable. When asked about meal planning, he reported none. He has not had a previous visit with a dietician. He participates in exercise daily. His breakfast blood glucose is taken between 8-9 am. His breakfast blood glucose range is generally 110-130 mg/dl. His overall blood glucose range is 110-130 mg/dl. An ACE inhibitor/angiotensin II receptor blocker is being taken. He does not see a podiatrist.Eye exam is current (6 months ago).      Review of Systems  Constitutional: Negative for weight loss.  HENT: Negative for neck pain.   Eyes: Negative for blurred vision.  Respiratory: Negative for shortness of breath.   Cardiovascular: Negative for chest pain and palpitations.  Endocrine: Negative for polydipsia, polyphagia and polyuria.  Neurological: Negative for headaches.  All other systems reviewed and are negative.       Objective:   Physical Exam  Constitutional: He is oriented to person, place, and time. He appears well-developed and well-nourished.  HENT:  Head: Normocephalic.  Right Ear: External ear normal.  Left Ear: External ear normal.  Nose: Nose normal.  Mouth/Throat: Oropharynx is clear and moist.  Eyes: EOM are normal. Pupils are equal, round, and reactive to light.  Neck: Normal range of motion. Neck supple. No thyromegaly present.  Cardiovascular: Normal rate, regular rhythm, normal heart sounds and intact distal pulses.   No murmur heard. Pulmonary/Chest: Effort normal and breath sounds normal. He has no wheezes. He has no rales.  Abdominal: Soft. Bowel sounds are normal.  Genitourinary: Prostate normal and penis normal.  Musculoskeletal: Normal range of motion.  Neurological: He is alert and oriented to person, place, and time.  Skin: Skin is warm and dry.  Psychiatric: He has a normal mood and affect. His behavior is normal. Judgment  and  thought content normal.    BP 115/70  Pulse 71  Temp(Src) 97.4 F (36.3 C) (Oral)  Ht 5\' 7"  (1.702 m)  Wt 159 lb (72.122 kg)  BMI 24.9 kg/m2 Results for orders placed in visit on 10/02/12  COMPLETE METABOLIC PANEL WITH GFR      Result Value Range   Sodium 139  135 - 145 mEq/L   Potassium 5.2  3.5 - 5.3 mEq/L   Chloride 105  96 - 112 mEq/L   CO2 30  19 - 32 mEq/L   Glucose, Bld 131 (*) 70 - 99 mg/dL   BUN 30 (*) 6 - 23 mg/dL   Creat 4.09  8.11 - 9.14 mg/dL   Total Bilirubin 0.4  0.3 - 1.2 mg/dL   Alkaline Phosphatase 35 (*) 39 - 117 U/L   AST 21  0 - 37 U/L   ALT 15  0 - 53 U/L   Total Protein 5.4 (*) 6.0 - 8.3 g/dL   Albumin 3.3 (*) 3.5 - 5.2 g/dL   Calcium 8.7  8.4 - 78.2 mg/dL   GFR, Est African American 63     GFR, Est Non African American 54 (*)   NMR LIPOPROFILE WITH LIPIDS      Result Value Range   LDL Particle Number [id number] (*) <1000 nmol/L   LDL (calc) 95  <100 mg/dL   HDL-C 51  >=95 mg/dL   Triglycerides 95  <621 mg/dL   Cholesterol, Total 308  <200 mg/dL   HDL Particle Number [id number]  >=84.6 umol/L   Large HDL-P <1.3 (*) >=4.8 umol/L   Large VLDL-P 1.7  <=2.7 nmol/L   Small LDL Particle Number [id number] (*) <=527 nmol/L   LDL Size 20.5 (*) >20.5 nm   HDL Size 8.3 (*) >=9.2 nm   VLDL Size 41.6  <=46.6 nm   LP-IR Score 59 (*) <=45  PSA      Result Value Range   PSA 2.31  <=4.00 ng/mL  POCT GLYCOSYLATED HEMOGLOBIN (HGB A1C)      Result Value Range   Hemoglobin A1C 6.0%           Assessment & Plan:   1. Hyperlipidemia   2. Type II or unspecified type diabetes mellitus without mention of complication, not stated as uncontrolled   3. Hypertension    No orders of the defined types were placed in this encounter.   Outpatient Encounter Prescriptions as of 10/06/2012  Medication Sig Dispense Refill  . aspirin 81 MG tablet Take 81 mg by mouth daily.      . benazepril (LOTENSIN) 40 MG tablet Take 1 tablet (40 mg total) by mouth daily.  90 tablet  1  . ezetimibe  (ZETIA) 10 MG tablet Take 1 tablet (10 mg total) by mouth daily.  90 tablet  1  . fenofibrate (TRICOR) 145 MG tablet Take 1 tablet (145 mg total) by mouth daily.  90 tablet  1  . furosemide (LASIX) 40 MG tablet       . metFORMIN (GLUCOPHAGE) 500 MG tablet TAKE 1 TABLET EVERY MORNING AND 2 TABLET AT BEDTIME  90 tablet  3  . ONE TOUCH ULTRA TEST test strip Daily.      . rosuvastatin (CRESTOR) 20 MG tablet Take 1 tablet (20 mg total) by mouth daily.  90 tablet  1  . vitamin E 400 UNIT capsule Take 400 Units by mouth daily.      . [DISCONTINUED] AMBULATORY NON FORMULARY MEDICATION Beta  Prostate  Take two capsule by mouth twice a day      . [DISCONTINUED] benazepril (LOTENSIN) 40 MG tablet Take 40 mg by mouth daily.      . [DISCONTINUED] fenofibrate (TRICOR) 145 MG tablet Take 1 tablet (145 mg total) by mouth daily.  30 tablet  1  . [DISCONTINUED] furosemide (LASIX) 40 MG tablet TAKE 1 TABLET (40 MG TOTAL) BY MOUTH 2 (TWO) TIMES DAILY.  60 tablet  0  . [DISCONTINUED] rosuvastatin (CRESTOR) 20 MG tablet Take 20 mg by mouth daily.      . [DISCONTINUED] ZETIA 10 MG tablet Daily.       No facility-administered encounter medications on file as of 10/06/2012.   Continue all meds Labs reviewed at appointment Continue diet and exercise Follow upo in 3 months  Mary-Margaret Daphine Deutscher, FNP

## 2012-10-06 NOTE — Patient Instructions (Signed)
Health Maintenance, Males A healthy lifestyle and preventative care can promote health and wellness.  Maintain regular health, dental, and eye exams.  Eat a healthy diet. Foods like vegetables, fruits, whole grains, low-fat dairy products, and lean protein foods contain the nutrients you need without too many calories. Decrease your intake of foods high in solid fats, added sugars, and salt. Get information about a proper diet from your caregiver, if necessary.  Regular physical exercise is one of the most important things you can do for your health. Most adults should get at least 150 minutes of moderate-intensity exercise (any activity that increases your heart rate and causes you to sweat) each week. In addition, most adults need muscle-strengthening exercises on 2 or more days a week.   Maintain a healthy weight. The body mass index (BMI) is a screening tool to identify possible weight problems. It provides an estimate of body fat based on height and weight. Your caregiver can help determine your BMI, and can help you achieve or maintain a healthy weight. For adults 20 years and older:  A BMI below 18.5 is considered underweight.  A BMI of 18.5 to 24.9 is normal.  A BMI of 25 to 29.9 is considered overweight.  A BMI of 30 and above is considered obese.  Maintain normal blood lipids and cholesterol by exercising and minimizing your intake of saturated fat. Eat a balanced diet with plenty of fruits and vegetables. Blood tests for lipids and cholesterol should begin at age 20 and be repeated every 5 years. If your lipid or cholesterol levels are high, you are over 50, or you are a high risk for heart disease, you may need your cholesterol levels checked more frequently.Ongoing high lipid and cholesterol levels should be treated with medicines, if diet and exercise are not effective.  If you smoke, find out from your caregiver how to quit. If you do not use tobacco, do not start.  If you  choose to drink alcohol, do not exceed 2 drinks per day. One drink is considered to be 12 ounces (355 mL) of beer, 5 ounces (148 mL) of wine, or 1.5 ounces (44 mL) of liquor.  Avoid use of street drugs. Do not share needles with anyone. Ask for help if you need support or instructions about stopping the use of drugs.  High blood pressure causes heart disease and increases the risk of stroke. Blood pressure should be checked at least every 1 to 2 years. Ongoing high blood pressure should be treated with medicines if weight loss and exercise are not effective.  If you are 45 to 67 years old, ask your caregiver if you should take aspirin to prevent heart disease.  Diabetes screening involves taking a blood sample to check your fasting blood sugar level. This should be done once every 3 years, after age 45, if you are within normal weight and without risk factors for diabetes. Testing should be considered at a younger age or be carried out more frequently if you are overweight and have at least 1 risk factor for diabetes.  Colorectal cancer can be detected and often prevented. Most routine colorectal cancer screening begins at the age of 50 and continues through age 75. However, your caregiver may recommend screening at an earlier age if you have risk factors for colon cancer. On a yearly basis, your caregiver may provide home test kits to check for hidden blood in the stool. Use of a small camera at the end of a tube,   to directly examine the colon (sigmoidoscopy or colonoscopy), can detect the earliest forms of colorectal cancer. Talk to your caregiver about this at age 50, when routine screening begins. Direct examination of the colon should be repeated every 5 to 10 years through age 75, unless early forms of pre-cancerous polyps or small growths are found.  Hepatitis C blood testing is recommended for all people born from 1945 through 1965 and any individual with known risks for hepatitis C.  Healthy  men should no longer receive prostate-specific antigen (PSA) blood tests as part of routine cancer screening. Consult with your caregiver about prostate cancer screening.  Testicular cancer screening is not recommended for adolescents or adult males who have no symptoms. Screening includes self-exam, caregiver exam, and other screening tests. Consult with your caregiver about any symptoms you have or any concerns you have about testicular cancer.  Practice safe sex. Use condoms and avoid high-risk sexual practices to reduce the spread of sexually transmitted infections (STIs).  Use sunscreen with a sun protection factor (SPF) of 30 or greater. Apply sunscreen liberally and repeatedly throughout the day. You should seek shade when your shadow is shorter than you. Protect yourself by wearing long sleeves, pants, a wide-brimmed hat, and sunglasses year round, whenever you are outdoors.  Notify your caregiver of new moles or changes in moles, especially if there is a change in shape or color. Also notify your caregiver if a mole is larger than the size of a pencil eraser.  A one-time screening for abdominal aortic aneurysm (AAA) and surgical repair of large AAAs by sound wave imaging (ultrasonography) is recommended for ages 65 to 75 years who are current or former smokers.  Stay current with your immunizations. Document Released: 09/08/2007 Document Revised: 06/04/2011 Document Reviewed: 08/07/2010 [employer] Patient Information 2014 [employer].  

## 2012-10-21 ENCOUNTER — Other Ambulatory Visit: Payer: Self-pay | Admitting: Nurse Practitioner

## 2012-12-15 ENCOUNTER — Other Ambulatory Visit: Payer: Self-pay

## 2012-12-15 MED ORDER — GLUCOSE BLOOD VI STRP: ORAL_STRIP | Status: DC

## 2013-01-26 ENCOUNTER — Other Ambulatory Visit: Payer: Self-pay | Admitting: Nurse Practitioner

## 2013-01-29 ENCOUNTER — Other Ambulatory Visit: Payer: Self-pay

## 2013-03-13 ENCOUNTER — Ambulatory Visit (INDEPENDENT_AMBULATORY_CARE_PROVIDER_SITE_OTHER): Payer: Medicare Other | Admitting: Family Medicine

## 2013-03-13 VITALS — BP 192/87 | HR 60 | Temp 98.4°F | Ht 67.0 in | Wt 169.0 lb

## 2013-03-13 DIAGNOSIS — L282 Other prurigo: Secondary | ICD-10-CM

## 2013-03-13 DIAGNOSIS — I10 Essential (primary) hypertension: Secondary | ICD-10-CM

## 2013-03-13 LAB — POCT CBC
Lymph, poc: 0.7 (ref 0.6–3.4)
MCH, POC: 29.7 pg (ref 27–31.2)
MCHC: 32.4 g/dL (ref 31.8–35.4)
MCV: 91.6 fL (ref 80–97)
MPV: 6.7 fL (ref 0–99.8)
POC LYMPH PERCENT: 18.3 %L (ref 10–50)
Platelet Count, POC: 243 10*3/uL (ref 142–424)
RBC: 4 M/uL — AB (ref 4.69–6.13)
RDW, POC: 13.7 %
WBC: 4 10*3/uL — AB (ref 4.6–10.2)

## 2013-03-13 LAB — POCT GLYCOSYLATED HEMOGLOBIN (HGB A1C): Hemoglobin A1C: 5.5

## 2013-03-13 MED ORDER — TRIAMCINOLONE ACETONIDE 0.5 % EX OINT: 1.0000 "application " | TOPICAL_OINTMENT | Freq: Two times a day (BID) | CUTANEOUS | Status: DC

## 2013-03-13 MED ORDER — AMLODIPINE BESYLATE 2.5 MG PO TABS: 2.5000 mg | ORAL_TABLET | Freq: Every day | ORAL | Status: DC

## 2013-03-13 NOTE — Progress Notes (Signed)
   Subjective:    Patient ID: Peter York, male    DOB: 07/16/1945, 67 y.o.   MRN: [id number]  Rash  Hypertension   Pt presents today with multiple complaints   BP: pt states that he has been checking his BP at home. Has been elevated into the 190s/100s. On BP medication currently. Lotensin 40 and lasix. Has been taking IBF consistent for MSK pain as pt rides horses on a daily basis. Denies and CP, SOB. No LE swelling.   Rash: Pt also reports pruritic rash on back over last 1-2 weeks. 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 Rash mainly over back and abdomen. No fevers or chills. Unclear if these may be bug bites?  HLD: Was on lipitor long term. Had HLD labs w/in last year with still poorly controlled LDL. Had additional medications added. Developed secondary myalgias. Stopped taking meds for several months. Myalgias improved. Would like LDL rechecked.     Review of Systems  Skin: Positive for rash.  All other systems reviewed and are negative.       Objective:   Physical Exam  Constitutional: He appears well-developed and well-nourished.  HENT:  Head: Normocephalic and atraumatic.  Right Ear: External ear normal.  Left Ear: External ear normal.  Eyes: Conjunctivae are normal. Pupils are equal, round, and reactive to light.  Neck: Normal range of motion. Neck supple.  Cardiovascular: Normal rate.   Pulmonary/Chest: Effort normal and breath sounds normal.  Abdominal: Soft.  Neurological: He is alert.  Skin: Skin is warm.     Multiple focal erythematous lesions.  Nontender Blanchable.            Assessment & Plan:  HTN (hypertension) - Plan: POCT glycosylated hemoglobin (Hb A1C), NMR, lipoprofile, Comprehensive metabolic panel, POCT CBC, amLODipine (NORVASC) 2.5 MG tablet  Pruritic rash - Plan: triamcinolone ointment (KENALOG) 0.5 %  Orders Placed This Encounter  Procedures  . NMR, lipoprofile  . Comprehensive metabolic panel  . POCT  glycosylated hemoglobin (Hb A1C)  . POCT CBC   BP: suspect elevated BP likely secondary to chronic NSAID use. Discussed avoidance. Will add on low dose norvasc in the interim. Plan for BP recheck in 1 week. Check metabolic labs.   Rash: ddx includes contact dermatitis, bed bugs, chiggers. Will rx high potency topical steroid (would like to avoid oral steroids given issue with BP). Discussed derm red flags.  Follow up as needed.   HLD: Recheck lipoprofile today. Pt states that he has had no issue with lipitor in the past. Will consider restarting this pending lipoprofile.

## 2013-03-16 LAB — COMPREHENSIVE METABOLIC PANEL
ALT: 15 IU/L (ref 0–44)
Albumin: 2.5 g/dL — ABNORMAL LOW (ref 3.6–4.8)
BUN: 15 mg/dL (ref 8–27)
CO2: 26 mmol/L (ref 18–29)
Calcium: 8.2 mg/dL — ABNORMAL LOW (ref 8.6–10.2)
Chloride: 103 mmol/L (ref 97–108)
GFR calc Af Amer: 97 mL/min/{1.73_m2} (ref >59)
Glucose: 108 mg/dL — ABNORMAL HIGH (ref 65–99)
Potassium: 3.7 mmol/L (ref 3.5–5.2)
Total Protein: 4.5 g/dL — ABNORMAL LOW (ref 6.0–8.5)

## 2013-03-16 LAB — NMR, LIPOPROFILE
HDL Cholesterol by NMR: 41 mg/dL (ref >=40)
HDL Particle Number: 21.1 umol/L — ABNORMAL LOW (ref >=30.5)
LP-IR Score: 55 — ABNORMAL HIGH (ref <=45)
Small LDL Particle Number: 1928 nmol/L — ABNORMAL HIGH (ref <=527)

## 2013-03-18 ENCOUNTER — Ambulatory Visit (INDEPENDENT_AMBULATORY_CARE_PROVIDER_SITE_OTHER): Payer: Medicare Other | Admitting: Nurse Practitioner

## 2013-03-18 ENCOUNTER — Other Ambulatory Visit: Payer: Self-pay | Admitting: Family Medicine

## 2013-03-18 ENCOUNTER — Encounter: Payer: Self-pay | Admitting: Nurse Practitioner

## 2013-03-18 VITALS — BP 173/93 | HR 70 | Temp 97.8°F | Ht 67.0 in | Wt 165.0 lb

## 2013-03-18 DIAGNOSIS — E785 Hyperlipidemia, unspecified: Secondary | ICD-10-CM

## 2013-03-18 DIAGNOSIS — I10 Essential (primary) hypertension: Secondary | ICD-10-CM

## 2013-03-18 MED ORDER — ROSUVASTATIN CALCIUM 40 MG PO TABS: 20.0000 mg | ORAL_TABLET | Freq: Every day | ORAL | Status: DC

## 2013-03-18 MED ORDER — CLONIDINE HCL 0.2 MG PO TABS: 0.2000 mg | ORAL_TABLET | Freq: Two times a day (BID) | ORAL | Status: DC

## 2013-03-18 NOTE — Progress Notes (Signed)
   Subjective:    Patient ID: Peter York, male    DOB: Sep 10, 1945, 67 y.o.   MRN: [id number]  HPI  Patinet in c/o elevated blood pressure- has been high for several weeks at home- Currently on benazepril 40 and amlodipine 2.5 daily.    Review of Systems  Constitutional: Negative.   Respiratory: Negative.   Cardiovascular: Negative.   Neurological: Positive for headaches.  Psychiatric/Behavioral: Negative.        Objective:   Physical Exam  Constitutional: He appears well-developed and well-nourished.  Cardiovascular: Normal rate, regular rhythm and normal heart sounds.   Pulmonary/Chest: Effort normal and breath sounds normal.     BP 173/93  Pulse 70  Temp(Src) 97.8 F (36.6 C) (Oral)  Ht 5\' 7"  (1.702 m)  Wt 165 lb (74.844 kg)  BMI 25.84 kg/m2      Assessment & Plan:   1. Hypertension    Meds ordered this encounter  Medications  . cloNIDine (CATAPRES) 0.2 MG tablet    Sig: Take 1 tablet (0.2 mg total) by mouth 2 (two) times daily.    Dispense:  60 tablet    Refill:  1    Order Specific Question:  Supervising Provider    Answer:  Ernestina Penna [[id number]]   Hold norvasc for now Avoid salt in diet Keep diary of blood pressure Keep follow up appointment in January  Bennie Pierini, FNP

## 2013-03-18 NOTE — Patient Instructions (Signed)

## 2013-04-07 ENCOUNTER — Other Ambulatory Visit: Payer: Medicare Other

## 2013-04-07 NOTE — Progress Notes (Signed)
Pt came in too soon for labs

## 2013-04-14 ENCOUNTER — Encounter: Payer: Self-pay | Admitting: Nurse Practitioner

## 2013-04-14 ENCOUNTER — Ambulatory Visit (INDEPENDENT_AMBULATORY_CARE_PROVIDER_SITE_OTHER): Payer: Medicare Other | Admitting: Nurse Practitioner

## 2013-04-14 VITALS — BP 125/78 | HR 64 | Temp 98.2°F | Ht 67.0 in | Wt 164.0 lb

## 2013-04-14 DIAGNOSIS — E785 Hyperlipidemia, unspecified: Secondary | ICD-10-CM

## 2013-04-14 DIAGNOSIS — E119 Type 2 diabetes mellitus without complications: Secondary | ICD-10-CM

## 2013-04-14 DIAGNOSIS — Z23 Encounter for immunization: Secondary | ICD-10-CM

## 2013-04-14 DIAGNOSIS — I10 Essential (primary) hypertension: Secondary | ICD-10-CM

## 2013-04-14 MED ORDER — ATORVASTATIN CALCIUM 40 MG PO TABS: 40.0000 mg | ORAL_TABLET | Freq: Every day | ORAL | Status: DC

## 2013-04-14 NOTE — Addendum Note (Signed)
Addended by: Chevis Pretty on: 04/14/2013 08:27 AM   Modules accepted: Orders

## 2013-04-14 NOTE — Progress Notes (Addendum)
Subjective:    Patient ID: Albertha GheeSteve S Bellevue, male    DOB: [birth year], 68 y.o.   MRN: [id number]  Hypertension This is a chronic problem. The current episode started more than 1 year ago. The problem is unchanged. The problem is uncontrolled (blood pressure usually highh in the mornings 170'2-180'2 systolic.). Pertinent negatives include no blurred vision, chest pain, headaches, neck pain, palpitations, peripheral edema or shortness of breath. There are no associated agents to hypertension. Risk factors for coronary artery disease include diabetes mellitus, dyslipidemia and male gender. Past treatments include ACE inhibitors and diuretics (has Norvasc 2.5 mg at home but has not been taking.). The current treatment provides significant improvement. Compliance problems include diet and exercise.   Hyperlipidemia This is a chronic problem. The current episode started more than 1 year ago. The problem is controlled. Exacerbating diseases include diabetes. He has no history of hypothyroidism or obesity. There are no known factors aggravating his hyperlipidemia. Pertinent negatives include no chest pain or shortness of breath. Treatments tried: stopped crestor, fenofibrate and zetia becasue made him hurt so bad- Took lipitor in the past without problems. The current treatment provides moderate improvement of lipids. Risk factors for coronary artery disease include diabetes mellitus, hypertension and male sex.  Diabetes He presents for his follow-up diabetic visit. He has type 2 diabetes mellitus. No MedicAlert identification noted. The initial diagnosis of diabetes was made 10 years ago. His disease course has been fluctuating. There are no hypoglycemic associated symptoms. Pertinent negatives for hypoglycemia include no headaches. Pertinent negatives for diabetes include no blurred vision, no chest pain, no polydipsia, no polyphagia, no polyuria, no visual change and no weight loss. There are no hypoglycemic  complications. Symptoms are stable. There are no diabetic complications. Risk factors for coronary artery disease include dyslipidemia, hypertension and male sex. Current diabetic treatment includes oral agent (monotherapy). He is compliant with treatment most of the time. His weight is stable. When asked about meal planning, he reported none. He has not had a previous visit with a dietician. He participates in exercise daily. His breakfast blood glucose is taken between 8-9 am. His breakfast blood glucose range is generally 130-140 mg/dl. His overall blood glucose range is 110-130 mg/dl. An ACE inhibitor/angiotensin II receptor blocker is being taken. He does not see a podiatrist.Eye exam is current (6 months ago).      Review of Systems  Constitutional: Negative for weight loss.  Eyes: Negative for blurred vision.  Respiratory: Negative for shortness of breath.   Cardiovascular: Negative for chest pain and palpitations.  Endocrine: Negative for polydipsia, polyphagia and polyuria.  Musculoskeletal: Negative for neck pain.  Neurological: Negative for headaches.  All other systems reviewed and are negative.       Objective:   Physical Exam  Constitutional: He is oriented to person, place, and time. He appears well-developed and well-nourished.  HENT:  Head: Normocephalic.  Right Ear: External ear normal.  Left Ear: External ear normal.  Nose: Nose normal.  Mouth/Throat: Oropharynx is clear and moist.  Eyes: EOM are normal. Pupils are equal, round, and reactive to light.  Neck: Normal range of motion. Neck supple. No thyromegaly present.  Cardiovascular: Normal rate, regular rhythm, normal heart sounds and intact distal pulses.   No murmur heard. Pulmonary/Chest: Effort normal and breath sounds normal. He has no wheezes. He has no rales.  Abdominal: Soft. Bowel sounds are normal.  Genitourinary: Prostate normal and penis normal.  Musculoskeletal: Normal range of motion.   Neurological: He  is alert and oriented to person, place, and time.  Skin: Skin is warm and dry.  Psychiatric: He has a normal mood and affect. His behavior is normal. Judgment and thought content normal.    BP 125/78  Pulse 64  Temp(Src) 98.2 F (36.8 C) (Oral)  Ht 5\' 7"  (1.702 m)  Wt 164 lb (74.39 kg)  BMI 25.68 kg/m2 Results for orders placed in visit on 03/13/13  NMR, LIPOPROFILE      Result Value Range   LDL Particle Number > [id number] (*) <1000 nmol/L   LDLC SERPL CALC-MCNC 363 (*) <100 mg/dL   HDL Cholesterol by NMR 41  >=40 mg/dL   Triglycerides by NMR 206 (*) <150 mg/dL   Cholesterol 445 (*) <200 mg/dL   HDL Particle Number [id number] (*) >=30.5 umol/L   Small LDL Particle Number [id number] (*) <=527 nmol/L   LDL Size 20.6  >20.5 nm   LP-IR Score 55 (*) <=45  COMPREHENSIVE METABOLIC PANEL      Result Value Range   Glucose 108 (*) 65 - 99 mg/dL   BUN 15  8 - 27 mg/dL   Creatinine, Ser 0.94  0.76 - 1.27 mg/dL   GFR calc non Af Amer 84  >59 mL/min/1.73   GFR calc Af Amer 97  >59 mL/min/1.73   BUN/Creatinine Ratio 16  10 - 22   Sodium 141  134 - 144 mmol/L   Potassium 3.7  3.5 - 5.2 mmol/L   Chloride 103  97 - 108 mmol/L   CO2 26  18 - 29 mmol/L   Calcium 8.2 (*) 8.6 - 10.2 mg/dL   Total Protein 4.5 (*) 6.0 - 8.5 g/dL   Albumin 2.5 (*) 3.6 - 4.8 g/dL   Globulin, Total 2.0  1.5 - 4.5 g/dL   Albumin/Globulin Ratio 1.3  1.1 - 2.5   Total Bilirubin 0.2  0.0 - 1.2 mg/dL   Alkaline Phosphatase 51  39 - 117 IU/L   AST 22  0 - 40 IU/L   ALT 15  0 - 44 IU/L  POCT GLYCOSYLATED HEMOGLOBIN (HGB A1C)      Result Value Range   Hemoglobin A1C 5.5    POCT CBC      Result Value Range   WBC 4.0 (*) 4.6 - 10.2 K/uL   Lymph, poc 0.7  0.6 - 3.4   POC LYMPH PERCENT 18.3  10 - 50 %L   MID (cbc)    0 - 0.9   POC MID %    0 - 12 %M   POC Granulocyte 3.0  2 - 6.9   Granulocyte percent 74.2  37 - 80 %G   RBC 4.0 (*) 4.69 - 6.13 M/uL   Hemoglobin 12.1 (*) 14.1 - 18.1 g/dL   HCT, POC 37.5 (*) 43.5  - 53.7 %   MCV 91.6  80 - 97 fL   MCH, POC 29.7  27 - 31.2 pg   MCHC 32.4  31.8 - 35.4 g/dL   RDW, POC 13.7     Platelet Count, POC 243.0  142 - 424 K/uL   MPV 6.7  0 - 99.8 fL         Assessment & Plan:   1. Hyperlipidemia   2. Hypertension   3. Type II or unspecified type diabetes mellitus without mention of complication, not stated as uncontrolled    Meds ordered this encounter  Medications  . cetirizine (ZYRTEC) 10 MG tablet    Sig: Take 10 mg  by mouth daily.  [name] atorvastatin (LIPITOR) 40 MG tablet    Sig: Take 1 tablet (40 mg total) by mouth daily.    Dispense:  30 tablet    Refill:  3    Order Specific Question:  Supervising Provider    Answer:  Chipper Herb [[id number]]    Take benazepril and clonidine in AM and norvasc and clonidine at night check blood pressure in AM Labs pending Health maintenance reviewed Diet and exercise encouraged Continue all meds Follow up  In 5 months   [place], FNP

## 2013-04-14 NOTE — Patient Instructions (Signed)

## 2013-04-14 NOTE — Addendum Note (Signed)
Addended by: Josephine Cables on: 04/14/2013 08:46 AM   Modules accepted: Orders

## 2013-05-04 ENCOUNTER — Other Ambulatory Visit: Payer: Self-pay | Admitting: Nurse Practitioner

## 2013-05-07 ENCOUNTER — Other Ambulatory Visit: Payer: Self-pay | Admitting: Nurse Practitioner

## 2013-05-10 ENCOUNTER — Other Ambulatory Visit: Payer: Self-pay | Admitting: Nurse Practitioner

## 2013-05-11 ENCOUNTER — Other Ambulatory Visit: Payer: Self-pay | Admitting: Family Medicine

## 2013-05-18 ENCOUNTER — Other Ambulatory Visit: Payer: Self-pay | Admitting: Family Medicine

## 2013-05-19 ENCOUNTER — Other Ambulatory Visit: Payer: Self-pay | Admitting: *Deleted

## 2013-05-19 MED ORDER — GLUCOSE BLOOD VI STRP: ORAL_STRIP | Status: DC

## 2013-07-02 ENCOUNTER — Other Ambulatory Visit: Payer: Self-pay | Admitting: *Deleted

## 2013-07-02 ENCOUNTER — Other Ambulatory Visit: Payer: Self-pay

## 2013-07-02 MED ORDER — METFORMIN HCL 500 MG PO TABS
ORAL_TABLET | ORAL | Status: DC
Start: 2013-07-02 — End: 2013-07-09

## 2013-07-09 ENCOUNTER — Other Ambulatory Visit: Payer: Self-pay | Admitting: *Deleted

## 2013-07-09 MED ORDER — METFORMIN HCL 500 MG PO TABS: ORAL_TABLET | ORAL | Status: DC

## 2013-07-22 ENCOUNTER — Other Ambulatory Visit: Payer: Self-pay | Admitting: *Deleted

## 2013-07-22 MED ORDER — FUROSEMIDE 40 MG PO TABS: 40.0000 mg | ORAL_TABLET | Freq: Two times a day (BID) | ORAL | Status: DC

## 2013-07-22 NOTE — Telephone Encounter (Signed)
Patient NTBS for follow up and lab work  

## 2013-07-22 NOTE — Telephone Encounter (Signed)
Last ov 03/2013

## 2013-09-10 ENCOUNTER — Other Ambulatory Visit (INDEPENDENT_AMBULATORY_CARE_PROVIDER_SITE_OTHER): Payer: Medicare Other

## 2013-09-10 ENCOUNTER — Other Ambulatory Visit: Payer: Self-pay | Admitting: Family Medicine

## 2013-09-10 DIAGNOSIS — E119 Type 2 diabetes mellitus without complications: Secondary | ICD-10-CM

## 2013-09-10 DIAGNOSIS — I10 Essential (primary) hypertension: Secondary | ICD-10-CM

## 2013-09-10 DIAGNOSIS — E785 Hyperlipidemia, unspecified: Secondary | ICD-10-CM

## 2013-09-10 LAB — POCT GLYCOSYLATED HEMOGLOBIN (HGB A1C): HEMOGLOBIN A1C: 6.1

## 2013-09-11 LAB — CMP14+EGFR
ALK PHOS: 49 IU/L (ref 39–117)
ALT: 13 IU/L (ref 0–44)
AST: 19 IU/L (ref 0–40)
Albumin/Globulin Ratio: 1.6 (ref 1.1–2.5)
Albumin: 3.8 g/dL (ref 3.6–4.8)
BUN/Creatinine Ratio: 29 — ABNORMAL HIGH (ref 10–22)
BUN: 36 mg/dL — AB (ref 8–27)
CALCIUM: 9.1 mg/dL (ref 8.6–10.2)
CHLORIDE: 104 mmol/L (ref 97–108)
CO2: 22 mmol/L (ref 18–29)
Creatinine, Ser: 1.26 mg/dL (ref 0.76–1.27)
GFR calc Af Amer: 67 mL/min/{1.73_m2} (ref >59)
GFR calc non Af Amer: 58 mL/min/{1.73_m2} — ABNORMAL LOW (ref >59)
Globulin, Total: 2.4 g/dL (ref 1.5–4.5)
Glucose: 127 mg/dL — ABNORMAL HIGH (ref 65–99)
Potassium: 5.4 mmol/L — ABNORMAL HIGH (ref 3.5–5.2)
SODIUM: 141 mmol/L (ref 134–144)
Total Bilirubin: 0.5 mg/dL (ref 0.0–1.2)
Total Protein: 6.2 g/dL (ref 6.0–8.5)

## 2013-09-11 LAB — NMR, LIPOPROFILE
Cholesterol: 292 mg/dL — ABNORMAL HIGH (ref 100–199)
HDL Cholesterol by NMR: 39 mg/dL — ABNORMAL LOW (ref >39)
HDL Particle Number: 26.4 umol/L — ABNORMAL LOW (ref >=30.5)
LDL PARTICLE NUMBER: 2709 nmol/L — AB (ref <1000)
LDL Size: 20.2 nm (ref >20.5)
LDLC SERPL CALC-MCNC: 223 mg/dL — AB (ref 0–99)
LP-IR Score: 63 — ABNORMAL HIGH (ref <=45)
Small LDL Particle Number: 1658 nmol/L — ABNORMAL HIGH (ref <=527)
Triglycerides by NMR: 149 mg/dL (ref 0–149)

## 2013-09-14 ENCOUNTER — Encounter: Payer: Self-pay | Admitting: Nurse Practitioner

## 2013-09-14 ENCOUNTER — Ambulatory Visit (INDEPENDENT_AMBULATORY_CARE_PROVIDER_SITE_OTHER): Payer: Medicare Other | Admitting: Nurse Practitioner

## 2013-09-14 VITALS — BP 112/64 | HR 57 | Temp 98.0°F | Ht 67.0 in | Wt 154.0 lb

## 2013-09-14 DIAGNOSIS — I10 Essential (primary) hypertension: Secondary | ICD-10-CM

## 2013-09-14 DIAGNOSIS — E785 Hyperlipidemia, unspecified: Secondary | ICD-10-CM

## 2013-09-14 DIAGNOSIS — E875 Hyperkalemia: Secondary | ICD-10-CM

## 2013-09-14 DIAGNOSIS — E119 Type 2 diabetes mellitus without complications: Secondary | ICD-10-CM

## 2013-09-14 MED ORDER — FUROSEMIDE 40 MG PO TABS: 40.0000 mg | ORAL_TABLET | Freq: Two times a day (BID) | ORAL | Status: DC

## 2013-09-14 MED ORDER — BENAZEPRIL HCL 40 MG PO TABS: ORAL_TABLET | ORAL | Status: DC

## 2013-09-14 MED ORDER — AMLODIPINE BESYLATE 2.5 MG PO TABS: 2.5000 mg | ORAL_TABLET | Freq: Every day | ORAL | Status: DC

## 2013-09-14 MED ORDER — METFORMIN HCL 500 MG PO TABS: ORAL_TABLET | ORAL | Status: DC

## 2013-09-14 MED ORDER — CLONIDINE HCL 0.2 MG PO TABS: 0.2000 mg | ORAL_TABLET | Freq: Every day | ORAL | Status: DC

## 2013-09-14 NOTE — Addendum Note (Signed)
Addended by: Chevis Pretty on: 09/14/2013 08:48 AM   Modules accepted: Orders

## 2013-09-14 NOTE — Progress Notes (Addendum)
Subjective:    Patient ID: Peter York, male    DOB: 01-09-1946, 68 y.o.   MRN: [id number]  Patient here today for follow up of chronic medical problems. No complaints   Hypertension This is a chronic problem. The current episode started more than 1 year ago. The problem is unchanged. The problem is uncontrolled (blood pressure usually highh in the mornings 956'2-130'8 systolic.). Pertinent negatives include no blurred vision, chest pain, headaches, neck pain, palpitations, peripheral edema or shortness of breath. There are no associated agents to hypertension. Risk factors for coronary artery disease include diabetes mellitus, dyslipidemia and male gender. Past treatments include ACE inhibitors and diuretics (has Norvasc 2.5 mg at home but has not been taking.). The current treatment provides significant improvement. Compliance problems include diet and exercise.   Hyperlipidemia This is a chronic problem. The current episode started more than 1 year ago. The problem is controlled. Exacerbating diseases include diabetes. He has no history of hypothyroidism or obesity. There are no known factors aggravating his hyperlipidemia. Pertinent negatives include no chest pain or shortness of breath. Treatments tried: lipitor caused such bad musclke aches that patient stopped taking. The current treatment provides moderate improvement of lipids. Risk factors for coronary artery disease include diabetes mellitus, hypertension and male sex.  Diabetes He presents for his follow-up diabetic visit. He has type 2 diabetes mellitus. No MedicAlert identification noted. The initial diagnosis of diabetes was made 10 years ago. His disease course has been fluctuating. There are no hypoglycemic associated symptoms. Pertinent negatives for hypoglycemia include no headaches. Pertinent negatives for diabetes include no blurred vision, no chest pain, no polydipsia, no polyphagia, no polyuria, no visual change and no weight  loss. There are no hypoglycemic complications. Symptoms are stable. There are no diabetic complications. Risk factors for coronary artery disease include dyslipidemia, hypertension and male sex. Current diabetic treatment includes oral agent (monotherapy). He is compliant with treatment most of the time. His weight is stable. When asked about meal planning, he reported none. He has not had a previous visit with a dietician. He participates in exercise daily. His breakfast blood glucose is taken between 8-9 am. His breakfast blood glucose range is generally 130-140 mg/dl. His overall blood glucose range is 110-130 mg/dl. An ACE inhibitor/angiotensin II receptor blocker is being taken. He does not see a podiatrist.Eye exam is current (6 months ago).      Review of Systems  Constitutional: Negative for weight loss.  Eyes: Negative for blurred vision.  Respiratory: Negative for shortness of breath.   Cardiovascular: Negative for chest pain and palpitations.  Endocrine: Negative for polydipsia, polyphagia and polyuria.  Musculoskeletal: Negative for neck pain.  Neurological: Negative for headaches.  All other systems reviewed and are negative.      Objective:   Physical Exam  Constitutional: He is oriented to person, place, and time. He appears well-developed and well-nourished.  HENT:  Head: Normocephalic.  Right Ear: External ear normal.  Left Ear: External ear normal.  Nose: Nose normal.  Mouth/Throat: Oropharynx is clear and moist.  Eyes: EOM are normal. Pupils are equal, round, and reactive to light.  Neck: Normal range of motion. Neck supple. No thyromegaly present.  Cardiovascular: Normal rate, regular rhythm, normal heart sounds and intact distal pulses.   No murmur heard. Pulmonary/Chest: Effort normal and breath sounds normal. He has no wheezes. He has no rales.  Abdominal: Soft. Bowel sounds are normal.  Genitourinary: Prostate normal and penis normal.  Musculoskeletal: Normal  range  of motion.  Neurological: He is alert and oriented to person, place, and time.  Skin: Skin is warm and dry.  Psychiatric: He has a normal mood and affect. His behavior is normal. Judgment and thought content normal.    BP 112/64  Pulse 57  Temp(Src) 98 F (36.7 C) (Oral)  Ht _0  (1.702 m)  Wt 154 lb (69.854 kg)  BMI 24.11 kg/m2  Results for orders placed in visit on 09/10/13  CMP14+EGFR      Result Value Ref Range   Glucose 127 (*) 65 - 99 mg/dL   BUN 36 (*) 8 - 27 mg/dL   Creatinine, Ser 1.26  0.76 - 1.27 mg/dL   GFR calc non Af Amer 58 (*) >59 mL/min/1.73   GFR calc Af Amer 67  >59 mL/min/1.73   BUN/Creatinine Ratio 29 (*) 10 - 22   Sodium 141  134 - 144 mmol/L   Potassium 5.4 (*) 3.5 - 5.2 mmol/L   Chloride 104  97 - 108 mmol/L   CO2 22  18 - 29 mmol/L   Calcium 9.1  8.6 - 10.2 mg/dL   Total Protein 6.2  6.0 - 8.5 g/dL   Albumin 3.8  3.6 - 4.8 g/dL   Globulin, Total 2.4  1.5 - 4.5 g/dL   Albumin/Globulin Ratio 1.6  1.1 - 2.5   Total Bilirubin 0.5  0.0 - 1.2 mg/dL   Alkaline Phosphatase 49  39 - 117 IU/L   AST 19  0 - 40 IU/L   ALT 13  0 - 44 IU/L  NMR, LIPOPROFILE      Result Value Ref Range   LDL Particle Number [id number] (*) <1000 nmol/L   LDLC SERPL CALC-MCNC 223 (*) 0 - 99 mg/dL   HDL Cholesterol by NMR 39 (*) >39 mg/dL   Triglycerides by NMR 149  0 - 149 mg/dL   Cholesterol 292 (*) 100 - 199 mg/dL   HDL Particle Number 26.4 (*) >=30.5 umol/L   Small LDL Particle Number [id number] (*) <=527 nmol/L   LDL Size 20.2  >20.5 nm   LP-IR Score 63 (*) <=45  POCT GLYCOSYLATED HEMOGLOBIN (HGB A1C)      Result Value Ref Range   Hemoglobin A1C 6.1           Assessment & Plan:   1. Type II or unspecified type diabetes mellitus without mention of complication, not stated as uncontrolled   2. Essential hypertension   3. Hyperlipidemia    Orders Placed This Encounter  Procedures  . BMP8+EGFR     Meds ordered this encounter  Medications  . DISCONTD: cloNIDine  (CATAPRES) 0.2 MG tablet    Sig: Take 0.2 mg by mouth daily.  [name] amLODipine (NORVASC) 2.5 MG tablet    Sig: Take 1 tablet (2.5 mg total) by mouth daily.    Dispense:  90 tablet    Refill:  3    Order Specific Question:  Supervising Provider    Answer:  Chipper Herb [[id number]]  . benazepril (LOTENSIN) 40 MG tablet    Sig: TAKE 1 TABLET (40 MG TOTAL) BY MOUTH DAILY.    Dispense:  90 tablet    Refill:  1    Order Specific Question:  Supervising Provider    Answer:  Chipper Herb [[id number]]  . cloNIDine (CATAPRES) 0.2 MG tablet    Sig: Take 1 tablet (0.2 mg total) by mouth daily.    Dispense:  90 tablet    Refill:  1    Order Specific Question:  Supervising Provider    Answer:  Chipper Herb [[id number]]  . furosemide (LASIX) 40 MG tablet    Sig: Take 1 tablet (40 mg total) by mouth 2 (two) times daily.    Dispense:  180 tablet    Refill:  1    Order Specific Question:  Supervising Provider    Answer:  Chipper Herb [[id number]]  . metFORMIN (GLUCOPHAGE) 500 MG tablet    Sig: TAKE 1 TABLET EVERY MORNING AND 2 TABLET AT BEDTIME    Dispense:  90 tablet    Refill:  2    Order Specific Question:  Supervising Provider    Answer:  Chipper Herb [[id number]]     Labs results discussed at appointment Health maintenance reviewed Diet and exercise encouraged Continue all meds Follow up  In 3 months   [place], FNP

## 2013-09-14 NOTE — Patient Instructions (Signed)

## 2013-09-15 LAB — BMP8+EGFR
BUN / CREAT RATIO: 25 — AB (ref 10–22)
BUN: 30 mg/dL — AB (ref 8–27)
CHLORIDE: 103 mmol/L (ref 97–108)
CO2: 23 mmol/L (ref 18–29)
CREATININE: 1.19 mg/dL (ref 0.76–1.27)
Calcium: 9 mg/dL (ref 8.6–10.2)
GFR calc Af Amer: 72 mL/min/{1.73_m2} (ref >59)
GFR, EST NON AFRICAN AMERICAN: 62 mL/min/{1.73_m2} (ref >59)
GLUCOSE: 181 mg/dL — AB (ref 65–99)
Potassium: 4.9 mmol/L (ref 3.5–5.2)
Sodium: 140 mmol/L (ref 134–144)

## 2013-09-30 ENCOUNTER — Other Ambulatory Visit: Payer: Self-pay | Admitting: Nurse Practitioner

## 2013-12-14 LAB — HM DIABETES EYE EXAM

## 2013-12-28 ENCOUNTER — Other Ambulatory Visit: Payer: Self-pay | Admitting: Nurse Practitioner

## 2013-12-29 ENCOUNTER — Telehealth: Payer: Self-pay | Admitting: Nurse Practitioner

## 2013-12-29 MED ORDER — GLUCOSE BLOOD VI STRP: ORAL_STRIP | Status: DC

## 2013-12-29 MED ORDER — METFORMIN HCL 500 MG PO TABS: ORAL_TABLET | ORAL | Status: DC

## 2013-12-29 NOTE — Telephone Encounter (Signed)
I sent the Rx to the pharmacy.

## 2013-12-31 ENCOUNTER — Other Ambulatory Visit: Payer: Self-pay | Admitting: *Deleted

## 2013-12-31 MED ORDER — GLUCOSE BLOOD VI STRP: ORAL_STRIP | Status: DC

## 2014-03-09 ENCOUNTER — Other Ambulatory Visit: Payer: Self-pay | Admitting: Nurse Practitioner

## 2014-03-09 ENCOUNTER — Other Ambulatory Visit (INDEPENDENT_AMBULATORY_CARE_PROVIDER_SITE_OTHER): Payer: Medicare Other

## 2014-03-09 DIAGNOSIS — I10 Essential (primary) hypertension: Secondary | ICD-10-CM

## 2014-03-09 DIAGNOSIS — E785 Hyperlipidemia, unspecified: Secondary | ICD-10-CM

## 2014-03-09 DIAGNOSIS — E119 Type 2 diabetes mellitus without complications: Secondary | ICD-10-CM

## 2014-03-09 LAB — POCT GLYCOSYLATED HEMOGLOBIN (HGB A1C): Hemoglobin A1C: 6.1

## 2014-03-09 NOTE — Progress Notes (Signed)
Labs only

## 2014-03-10 LAB — CMP14+EGFR
ALBUMIN: 3.9 g/dL (ref 3.6–4.8)
ALT: 12 IU/L (ref 0–44)
AST: 19 IU/L (ref 0–40)
Albumin/Globulin Ratio: 1.7 (ref 1.1–2.5)
Alkaline Phosphatase: 52 IU/L (ref 39–117)
BUN/Creatinine Ratio: 19 (ref 10–22)
BUN: 21 mg/dL (ref 8–27)
CO2: 25 mmol/L (ref 18–29)
CREATININE: 1.12 mg/dL (ref 0.76–1.27)
Calcium: 9.3 mg/dL (ref 8.6–10.2)
Chloride: 103 mmol/L (ref 97–108)
GFR calc Af Amer: 78 mL/min/{1.73_m2} (ref >59)
GFR calc non Af Amer: 67 mL/min/{1.73_m2} (ref >59)
GLUCOSE: 140 mg/dL — AB (ref 65–99)
Globulin, Total: 2.3 g/dL (ref 1.5–4.5)
Potassium: 5 mmol/L (ref 3.5–5.2)
Sodium: 140 mmol/L (ref 134–144)
TOTAL PROTEIN: 6.2 g/dL (ref 6.0–8.5)
Total Bilirubin: 0.6 mg/dL (ref 0.0–1.2)

## 2014-03-10 LAB — NMR, LIPOPROFILE
Cholesterol: 261 mg/dL — ABNORMAL HIGH (ref 100–199)
HDL CHOLESTEROL BY NMR: 43 mg/dL (ref >39)
HDL Particle Number: 27.8 umol/L — ABNORMAL LOW (ref >=30.5)
LDL PARTICLE NUMBER: 2334 nmol/L — AB (ref <1000)
LDL Size: 21.1 nm (ref >20.5)
LDL-C: 195 mg/dL — AB (ref 0–99)
LP-IR Score: 42 (ref <=45)
Small LDL Particle Number: 1140 nmol/L — ABNORMAL HIGH (ref <=527)
TRIGLYCERIDES BY NMR: 115 mg/dL (ref 0–149)

## 2014-03-15 ENCOUNTER — Encounter: Payer: Self-pay | Admitting: Nurse Practitioner

## 2014-03-15 ENCOUNTER — Ambulatory Visit (INDEPENDENT_AMBULATORY_CARE_PROVIDER_SITE_OTHER): Payer: Medicare Other | Admitting: Nurse Practitioner

## 2014-03-15 VITALS — BP 120/75 | HR 57 | Temp 96.9°F | Ht 67.0 in | Wt 155.0 lb

## 2014-03-15 DIAGNOSIS — E119 Type 2 diabetes mellitus without complications: Secondary | ICD-10-CM

## 2014-03-15 DIAGNOSIS — E785 Hyperlipidemia, unspecified: Secondary | ICD-10-CM

## 2014-03-15 DIAGNOSIS — I10 Essential (primary) hypertension: Secondary | ICD-10-CM

## 2014-03-15 MED ORDER — CLONIDINE HCL 0.2 MG PO TABS: 0.2000 mg | ORAL_TABLET | Freq: Two times a day (BID) | ORAL | Status: DC

## 2014-03-15 MED ORDER — FUROSEMIDE 40 MG PO TABS: ORAL_TABLET | ORAL | Status: DC

## 2014-03-15 MED ORDER — METFORMIN HCL 500 MG PO TABS: ORAL_TABLET | ORAL | Status: DC

## 2014-03-15 MED ORDER — BENAZEPRIL HCL 40 MG PO TABS: ORAL_TABLET | ORAL | Status: DC

## 2014-03-15 NOTE — Progress Notes (Signed)
Subjective:    Patient ID: Peter York, male    DOB: 03-12-1946, 68 y.o.   MRN: [id number]  Hypertension This is a chronic problem. The current episode started more than 1 year ago. The problem has been waxing and waning since onset. The problem is controlled. Pertinent negatives include no chest pain, headaches, neck pain, palpitations or shortness of breath. Risk factors for coronary artery disease include dyslipidemia and male gender. Past treatments include calcium channel blockers, ACE inhibitors, central alpha agonists and diuretics. The current treatment provides significant improvement. There are no compliance problems.   Hyperlipidemia This is a chronic problem. The current episode started more than 1 year ago. The problem is controlled. Recent lipid tests were reviewed and are normal. Exacerbating diseases include diabetes. Factors aggravating his hyperlipidemia include smoking. Pertinent negatives include no chest pain or shortness of breath. He is currently on no antihyperlipidemic treatment. The current treatment provides no improvement of lipids. There are no compliance problems.  Risk factors for coronary artery disease include diabetes mellitus and male sex.  Diabetes He presents for his follow-up diabetic visit. He has type 2 diabetes mellitus. No MedicAlert identification noted. His disease course has been stable. Pertinent negatives for hypoglycemia include no headaches. Pertinent negatives for diabetes include no chest pain, no polydipsia, no polyphagia, no polyuria and no visual change. There are no hypoglycemic complications. There are no diabetic complications. Risk factors for coronary artery disease include dyslipidemia, family history, hypertension and obesity. Current diabetic treatment includes oral agent (monotherapy). His weight is stable. He is following a diabetic diet. When asked about meal planning, he reported none. He has not had a previous visit with a dietitian. He  participates in exercise daily. There is no change in his home blood glucose trend. His breakfast blood glucose is taken between 9-10 am. His breakfast blood glucose range is generally 130-140 mg/dl. His overall blood glucose range is 130-140 mg/dl. An ACE inhibitor/angiotensin II receptor blocker is being taken. He does not see a podiatrist.Eye exam is not current.      Review of Systems  Respiratory: Negative for shortness of breath.   Cardiovascular: Negative for chest pain and palpitations.  Endocrine: Negative for polydipsia, polyphagia and polyuria.  Musculoskeletal: Negative for neck pain.  Neurological: Negative for headaches.  All other systems reviewed and are negative.      Objective:   Physical Exam  Constitutional: He is oriented to person, place, and time. He appears well-developed and well-nourished.  HENT:  Head: Normocephalic.  Right Ear: External ear normal.  Left Ear: External ear normal.  Nose: Nose normal.  Mouth/Throat: Oropharynx is clear and moist.  Eyes: EOM are normal. Pupils are equal, round, and reactive to light.  Neck: Normal range of motion. Neck supple. No JVD present. No thyromegaly present.  Cardiovascular: Normal rate, regular rhythm, normal heart sounds and intact distal pulses.  Exam reveals no gallop and no friction rub.   No murmur heard. Pulmonary/Chest: Effort normal and breath sounds normal. No respiratory distress. He has no wheezes. He has no rales. He exhibits no tenderness.  Abdominal: Soft. Bowel sounds are normal. He exhibits no mass. There is no tenderness.  Genitourinary: Prostate normal and penis normal.  Musculoskeletal: Normal range of motion. He exhibits no edema.  Lymphadenopathy:    He has no cervical adenopathy.  Neurological: He is alert and oriented to person, place, and time. No cranial nerve deficit.  Skin: Skin is warm and dry.  Psychiatric: He has a  normal mood and affect. His behavior is normal. Judgment and thought  content normal.    BP 120/75 mmHg  Pulse 57  Temp(Src) 96.9 F (36.1 C) (Oral)  Ht 5' 7"  (1.702 m)  Wt 155 lb (70.308 kg)  BMI 24.27 kg/m2  Results for orders placed or performed in visit on 03/09/14  CMP14+EGFR  Result Value Ref Range   Glucose 140 (H) 65 - 99 mg/dL   BUN 21 8 - 27 mg/dL   Creatinine, Ser 1.12 0.76 - 1.27 mg/dL   GFR calc non Af Amer 67 >59 mL/min/1.73   GFR calc Af Amer 78 >59 mL/min/1.73   BUN/Creatinine Ratio 19 10 - 22   Sodium 140 134 - 144 mmol/L   Potassium 5.0 3.5 - 5.2 mmol/L   Chloride 103 97 - 108 mmol/L   CO2 25 18 - 29 mmol/L   Calcium 9.3 8.6 - 10.2 mg/dL   Total Protein 6.2 6.0 - 8.5 g/dL   Albumin 3.9 3.6 - 4.8 g/dL   Globulin, Total 2.3 1.5 - 4.5 g/dL   Albumin/Globulin Ratio 1.7 1.1 - 2.5   Total Bilirubin 0.6 0.0 - 1.2 mg/dL   Alkaline Phosphatase 52 39 - 117 IU/L   AST 19 0 - 40 IU/L   ALT 12 0 - 44 IU/L  NMR, lipoprofile  Result Value Ref Range   LDL Particle Number [id number] (H) <1000 nmol/L   LDL-C 195 (H) 0 - 99 mg/dL   HDL Cholesterol by NMR 43 >39 mg/dL   Triglycerides by NMR 115 0 - 149 mg/dL   Cholesterol 261 (H) 100 - 199 mg/dL   HDL Particle Number 27.8 (L) >=30.5 umol/L   Small LDL Particle Number 1140 (H) <=527 nmol/L   LDL Size 21.1 >20.5 nm   LP-IR Score 42 <=45  POCT glycosylated hemoglobin (Hb A1C)  Result Value Ref Range   Hemoglobin A1C 6.1%          Assessment & Plan:   1. Essential hypertension Do not add salt to diet - benazepril (LOTENSIN) 40 MG tablet; TAKE 1 TABLET (40 MG TOTAL) BY MOUTH DAILY.  Dispense: 90 tablet; Refill: 1 - cloNIDine (CATAPRES) 0.2 MG tablet; Take 1 tablet (0.2 mg total) by mouth 2 (two) times daily.  Dispense: 180 tablet; Refill: 1 - furosemide (LASIX) 40 MG tablet; TAKE 1 TABLET (40 MG TOTAL) BY MOUTH 2 (TWO) TIMES DAILY.  Dispense: 180 tablet; Refill: 1  2. Hyperlipidemia with target LDL less than 100 Low fat diet  3. Type 2 diabetes mellitus without complication Low carb  diet - metFORMIN (GLUCOPHAGE) 500 MG tablet; TAKE 1 TABLET EVERY MORNING AND 2 TABLET AT BEDTIME  Dispense: 270 tablet; Refill: 1    Labs pending Health maintenance reviewed Diet and exercise encouraged Continue all meds Follow up  In 3 month   [place], FNP

## 2014-03-15 NOTE — Patient Instructions (Signed)

## 2014-04-09 ENCOUNTER — Ambulatory Visit (INDEPENDENT_AMBULATORY_CARE_PROVIDER_SITE_OTHER): Payer: Medicare Other | Admitting: Nurse Practitioner

## 2014-04-09 ENCOUNTER — Encounter: Payer: Self-pay | Admitting: Nurse Practitioner

## 2014-04-09 VITALS — BP 163/84 | HR 58 | Temp 97.8°F | Ht 67.0 in | Wt 158.0 lb

## 2014-04-09 DIAGNOSIS — I10 Essential (primary) hypertension: Secondary | ICD-10-CM

## 2014-04-09 MED ORDER — AMLODIPINE BESYLATE 5 MG PO TABS: 5.0000 mg | ORAL_TABLET | Freq: Every day | ORAL | Status: DC

## 2014-04-09 MED ORDER — CLONIDINE HCL 0.3 MG PO TABS: 0.3000 mg | ORAL_TABLET | Freq: Two times a day (BID) | ORAL | Status: DC

## 2014-04-09 NOTE — Patient Instructions (Signed)

## 2014-04-09 NOTE — Progress Notes (Signed)
   Subjective:    Patient ID: Peter York, male    DOB: 25-Aug-1945, 69 y.o.   MRN: [id number]  HPI Patient in c/o elevated blood pressure in the mornings- worse when the weather is really cold. He is currently on norvasc 2.5mg , benazepril 40mg  daily and clonidine 0.2 BID. He usually takes the amlodipine at night and benazepril in the mornings. He denies any headache of chest pain.    Review of Systems  Constitutional: Negative.   HENT: Negative.   Respiratory: Negative.   Cardiovascular: Negative.   Genitourinary: Negative.   Neurological: Negative.   Psychiatric/Behavioral: Negative.   All other systems reviewed and are negative.      Objective:   Physical Exam  Constitutional: He is oriented to person, place, and time. He appears well-developed and well-nourished.  Cardiovascular: Normal rate, regular rhythm and normal heart sounds.   Pulmonary/Chest: Effort normal and breath sounds normal.  Neurological: He is alert and oriented to person, place, and time.  Skin: Skin is warm.  Psychiatric: He has a normal mood and affect. His behavior is normal. Judgment and thought content normal.   BP 163/84 mmHg  Pulse 58  Temp(Src) 97.8 F (36.6 C) (Oral)  Ht 5\' 7"  (1.702 m)  Wt 158 lb (71.668 kg)  BMI 24.74 kg/m2         Assessment & Plan:  1. Essential hypertension Increased clonidine from 0.2mg  bid to 0.3mg  bid and increased amlodipine form 2.5mg  to 5mg  daily continue to take amlodipine at night with clinidine Do not add slat to diet No decongestants Keep diary of blood pressure - cloNIDine (CATAPRES) 0.3 MG tablet; Take 1 tablet (0.3 mg total) by mouth 2 (two) times daily.  Dispense: 60 tablet; Refill: 3 - amLODipine (NORVASC) 5 MG tablet; Take 1 tablet (5 mg total) by mouth daily.  Dispense: 90 tablet; Refill: [place], FNP

## 2014-05-12 ENCOUNTER — Encounter: Payer: Self-pay | Admitting: *Deleted

## 2014-09-12 ENCOUNTER — Other Ambulatory Visit: Payer: Self-pay | Admitting: Nurse Practitioner

## 2014-09-14 ENCOUNTER — Other Ambulatory Visit (INDEPENDENT_AMBULATORY_CARE_PROVIDER_SITE_OTHER): Payer: Medicare Other

## 2014-09-14 DIAGNOSIS — E119 Type 2 diabetes mellitus without complications: Secondary | ICD-10-CM

## 2014-09-14 DIAGNOSIS — I10 Essential (primary) hypertension: Secondary | ICD-10-CM

## 2014-09-14 DIAGNOSIS — E785 Hyperlipidemia, unspecified: Secondary | ICD-10-CM

## 2014-09-14 LAB — POCT GLYCOSYLATED HEMOGLOBIN (HGB A1C): HEMOGLOBIN A1C: 6.4

## 2014-09-15 LAB — CMP14+EGFR
ALK PHOS: 50 IU/L (ref 39–117)
ALT: 10 IU/L (ref 0–44)
AST: 16 IU/L (ref 0–40)
Albumin/Globulin Ratio: 1.5 (ref 1.1–2.5)
Albumin: 3.8 g/dL (ref 3.6–4.8)
BILIRUBIN TOTAL: 0.4 mg/dL (ref 0.0–1.2)
BUN / CREAT RATIO: 19 (ref 10–22)
BUN: 22 mg/dL (ref 8–27)
CHLORIDE: 103 mmol/L (ref 97–108)
CO2: 25 mmol/L (ref 18–29)
Calcium: 9.1 mg/dL (ref 8.6–10.2)
Creatinine, Ser: 1.13 mg/dL (ref 0.76–1.27)
GFR calc Af Amer: 76 mL/min/{1.73_m2} (ref >59)
GFR, EST NON AFRICAN AMERICAN: 66 mL/min/{1.73_m2} (ref >59)
GLUCOSE: 139 mg/dL — AB (ref 65–99)
Globulin, Total: 2.5 g/dL (ref 1.5–4.5)
Potassium: 4.6 mmol/L (ref 3.5–5.2)
Sodium: 140 mmol/L (ref 134–144)
Total Protein: 6.3 g/dL (ref 6.0–8.5)

## 2014-09-15 LAB — LIPID PANEL
CHOLESTEROL TOTAL: 218 mg/dL — AB (ref 100–199)
Chol/HDL Ratio: 5.7 ratio units — ABNORMAL HIGH (ref 0.0–5.0)
HDL: 38 mg/dL — AB (ref >39)
LDL Calculated: 159 mg/dL — ABNORMAL HIGH (ref 0–99)
Triglycerides: 104 mg/dL (ref 0–149)
VLDL CHOLESTEROL CAL: 21 mg/dL (ref 5–40)

## 2014-09-16 ENCOUNTER — Ambulatory Visit (INDEPENDENT_AMBULATORY_CARE_PROVIDER_SITE_OTHER): Payer: Medicare Other

## 2014-09-16 ENCOUNTER — Encounter: Payer: Self-pay | Admitting: Nurse Practitioner

## 2014-09-16 ENCOUNTER — Ambulatory Visit (INDEPENDENT_AMBULATORY_CARE_PROVIDER_SITE_OTHER): Payer: Medicare Other | Admitting: Nurse Practitioner

## 2014-09-16 VITALS — BP 155/80 | HR 47 | Temp 96.9°F | Ht 67.0 in | Wt 157.0 lb

## 2014-09-16 DIAGNOSIS — E785 Hyperlipidemia, unspecified: Secondary | ICD-10-CM | POA: Diagnosis not present

## 2014-09-16 DIAGNOSIS — Z87891 Personal history of nicotine dependence: Secondary | ICD-10-CM

## 2014-09-16 DIAGNOSIS — Z720 Tobacco use: Secondary | ICD-10-CM

## 2014-09-16 DIAGNOSIS — I10 Essential (primary) hypertension: Secondary | ICD-10-CM

## 2014-09-16 DIAGNOSIS — E1169 Type 2 diabetes mellitus with other specified complication: Secondary | ICD-10-CM | POA: Insufficient documentation

## 2014-09-16 DIAGNOSIS — Z23 Encounter for immunization: Secondary | ICD-10-CM

## 2014-09-16 DIAGNOSIS — E119 Type 2 diabetes mellitus without complications: Secondary | ICD-10-CM | POA: Diagnosis not present

## 2014-09-16 MED ORDER — METFORMIN HCL 500 MG PO TABS: ORAL_TABLET | ORAL | Status: DC

## 2014-09-16 MED ORDER — BENAZEPRIL HCL 40 MG PO TABS: ORAL_TABLET | ORAL | Status: DC

## 2014-09-16 MED ORDER — FUROSEMIDE 40 MG PO TABS: ORAL_TABLET | ORAL | Status: DC

## 2014-09-16 MED ORDER — COLESEVELAM HCL 625 MG PO TABS: 1875.0000 mg | ORAL_TABLET | Freq: Two times a day (BID) | ORAL | Status: DC

## 2014-09-16 MED ORDER — AMLODIPINE BESYLATE 5 MG PO TABS: 5.0000 mg | ORAL_TABLET | Freq: Every day | ORAL | Status: DC

## 2014-09-16 NOTE — Progress Notes (Signed)
Subjective:    Patient ID: Peter York, male    DOB: 1945/12/21, 69 y.o.   MRN: [id number]  Hypertension This is a chronic problem. The current episode started more than 1 year ago. The problem has been waxing and waning since onset. The problem is controlled. Pertinent negatives include no chest pain, headaches, neck pain, palpitations or shortness of breath. Risk factors for coronary artery disease include dyslipidemia and male gender. Past treatments include calcium channel blockers, ACE inhibitors, central alpha agonists and diuretics (when he takes his clonidine his blood pressure gets to low.). The current treatment provides significant improvement. There are no compliance problems.  There is no history of CAD/MI, CVA or retinopathy.  Hyperlipidemia This is a chronic problem. The current episode started more than 1 year ago. The problem is controlled. Recent lipid tests were reviewed and are normal. Exacerbating diseases include diabetes. Factors aggravating his hyperlipidemia include smoking. Pertinent negatives include no chest pain or shortness of breath. He is currently on no antihyperlipidemic treatment (refuses statins because they make him feel so bad). The current treatment provides no improvement of lipids. There are no compliance problems.  Risk factors for coronary artery disease include diabetes mellitus and male sex.  Diabetes He presents for his follow-up diabetic visit. He has type 2 diabetes mellitus. No MedicAlert identification noted. His disease course has been stable. Pertinent negatives for hypoglycemia include no headaches. Pertinent negatives for diabetes include no chest pain, no polydipsia, no polyphagia, no polyuria and no visual change. There are no hypoglycemic complications. There are no diabetic complications. Pertinent negatives for diabetic complications include no CVA or retinopathy. Risk factors for coronary artery disease include dyslipidemia, family history,  hypertension and obesity. Current diabetic treatment includes oral agent (monotherapy). His weight is stable. He is following a diabetic diet. When asked about meal planning, he reported none. He has not had a previous visit with a dietitian. He participates in exercise daily. There is no change in his home blood glucose trend. His breakfast blood glucose is taken between 9-10 am. His breakfast blood glucose range is generally 130-140 mg/dl. His overall blood glucose range is 130-140 mg/dl. An ACE inhibitor/angiotensin II receptor blocker is being taken. He does not see a podiatrist.Eye exam is not current.      Review of Systems  Constitutional: Negative.   Respiratory: Negative for shortness of breath.   Cardiovascular: Negative for chest pain and palpitations.  Endocrine: Negative for polydipsia, polyphagia and polyuria.  Genitourinary: Negative.   Musculoskeletal: Negative for neck pain.  Neurological: Negative for headaches.  Psychiatric/Behavioral: Negative.   All other systems reviewed and are negative.      Objective:   Physical Exam  Constitutional: He is oriented to person, place, and time. He appears well-developed and well-nourished.  HENT:  Head: Normocephalic.  Right Ear: External ear normal.  Left Ear: External ear normal.  Nose: Nose normal.  Mouth/Throat: Oropharynx is clear and moist.  Eyes: EOM are normal. Pupils are equal, round, and reactive to light.  Neck: Normal range of motion. Neck supple. No JVD present. No thyromegaly present.  Cardiovascular: Normal rate, regular rhythm, normal heart sounds and intact distal pulses.  Exam reveals no gallop and no friction rub.   No murmur heard. Pulmonary/Chest: Effort normal and breath sounds normal. No respiratory distress. He has no wheezes. He has no rales. He exhibits no tenderness.  Abdominal: Soft. Bowel sounds are normal. He exhibits no mass. There is no tenderness.  Genitourinary: Prostate normal  and penis  normal.  Musculoskeletal: Normal range of motion. He exhibits no edema.  Lymphadenopathy:    He has no cervical adenopathy.  Neurological: He is alert and oriented to person, place, and time. No cranial nerve deficit.  Skin: Skin is warm and dry.  Psychiatric: He has a normal mood and affect. His behavior is normal. Judgment and thought content normal.    BP 155/80 mmHg  Pulse 47  Temp(Src) 96.9 F (36.1 C) (Oral)  Ht 5' 7"  (1.702 m)  Wt 157 lb (71.215 kg)  BMI 24.58 kg/m2  Results for orders placed or performed in visit on 09/14/14  CMP14+EGFR  Result Value Ref Range   Glucose 139 (H) 65 - 99 mg/dL   BUN 22 8 - 27 mg/dL   Creatinine, Ser 1.13 0.76 - 1.27 mg/dL   GFR calc non Af Amer 66 >59 mL/min/1.73   GFR calc Af Amer 76 >59 mL/min/1.73   BUN/Creatinine Ratio 19 10 - 22   Sodium 140 134 - 144 mmol/L   Potassium 4.6 3.5 - 5.2 mmol/L   Chloride 103 97 - 108 mmol/L   CO2 25 18 - 29 mmol/L   Calcium 9.1 8.6 - 10.2 mg/dL   Total Protein 6.3 6.0 - 8.5 g/dL   Albumin 3.8 3.6 - 4.8 g/dL   Globulin, Total 2.5 1.5 - 4.5 g/dL   Albumin/Globulin Ratio 1.5 1.1 - 2.5   Bilirubin Total 0.4 0.0 - 1.2 mg/dL   Alkaline Phosphatase 50 39 - 117 IU/L   AST 16 0 - 40 IU/L   ALT 10 0 - 44 IU/L  Lipid panel  Result Value Ref Range   Cholesterol, Total 218 (H) 100 - 199 mg/dL   Triglycerides 104 0 - 149 mg/dL   HDL 38 (L) >39 mg/dL   VLDL Cholesterol Cal 21 5 - 40 mg/dL   LDL Calculated 159 (H) 0 - 99 mg/dL   Chol/HDL Ratio 5.7 (H) 0.0 - 5.0 ratio units  POCT glycosylated hemoglobin (Hb A1C)  Result Value Ref Range   Hemoglobin A1C 6.4    Chest xray- normal-Preliminary reading by Ronnald Collum, FNP  [facility] EKG- sinus bradycardia-Mary-Margaret Hassell Done, FNP       Assessment & Plan:   1. Essential hypertension Patient told to try 1/2 of clonidine in AM nad whole tablet in evenung Keep diary of blood pressure - benazepril (LOTENSIN) 40 MG tablet; TAKE 1 TABLET (40 MG TOTAL) BY MOUTH  DAILY.  Dispense: 90 tablet; Refill: 1 - amLODipine (NORVASC) 5 MG tablet; Take 1 tablet (5 mg total) by mouth daily.  Dispense: 90 tablet; Refill: 3 - furosemide (LASIX) 40 MG tablet; TAKE 1 TABLET (40 MG TOTAL) BY MOUTH 2 (TWO) TIMES DAILY.  Dispense: 180 tablet; Refill: 1 - EKG 12-Lead  2. Hyperlipidemia with target LDL less than 100 Low fat diet Agreed to try welchol - colesevelam (WELCHOL) 625 MG tablet; Take 3 tablets (1,875 mg total) by mouth 2 (two) times daily with a meal.  Dispense: 180 tablet; Refill: 5  3. Type 2 diabetes mellitus without complication Continue carb counting - metFORMIN (GLUCOPHAGE) 500 MG tablet; TAKE 1 TABLET EVERY MORNING AND 2 TABLET AT BEDTIME  Dispense: 270 tablet; Refill: 1  4. Smoking hx - DG Chest 2 View; Future    Labs reviewed at appointment Health maintenance reviewed Diet and exercise encouraged Continue all meds Follow up  In 3 months   [place], FNP

## 2014-09-16 NOTE — Addendum Note (Signed)
Addended by: Rolena Infante on: 09/16/2014 11:18 AM   Modules accepted: Orders

## 2014-09-17 LAB — SPECIMEN STATUS REPORT

## 2014-09-17 LAB — PSA, TOTAL AND FREE
PSA, Free Pct: 49.6 %
PSA, Free: 1.34 ng/mL
Prostate Specific Ag, Serum: 2.7 ng/mL (ref 0.0–4.0)

## 2014-09-30 ENCOUNTER — Encounter: Payer: Self-pay | Admitting: Internal Medicine

## 2014-10-04 ENCOUNTER — Encounter: Payer: Self-pay | Admitting: Internal Medicine

## 2014-10-14 ENCOUNTER — Other Ambulatory Visit: Payer: Self-pay | Admitting: Nurse Practitioner

## 2014-11-07 ENCOUNTER — Other Ambulatory Visit: Payer: Self-pay | Admitting: Nurse Practitioner

## 2014-12-06 ENCOUNTER — Ambulatory Visit (AMBULATORY_SURGERY_CENTER): Payer: Self-pay | Admitting: *Deleted

## 2014-12-06 VITALS — Ht 68.0 in | Wt 154.0 lb

## 2014-12-06 DIAGNOSIS — Z86010 Personal history of colonic polyps: Secondary | ICD-10-CM

## 2014-12-06 NOTE — Progress Notes (Signed)
Patient denies any allergies to eggs or soy. Patient denies any problems with anesthesia/sedation. Patient denies any oxygen use at home and does not take any diet/weight loss medications. EMMI education assisgned to patient on colonoscopy, this was explained and instructions given to patient. 

## 2014-12-14 LAB — HM DIABETES EYE EXAM

## 2014-12-20 ENCOUNTER — Encounter: Payer: Self-pay | Admitting: Internal Medicine

## 2014-12-20 ENCOUNTER — Ambulatory Visit (AMBULATORY_SURGERY_CENTER): Payer: Medicare Other | Admitting: Internal Medicine

## 2014-12-20 VITALS — BP 140/75 | HR 48 | Temp 96.3°F | Resp 18 | Ht 68.0 in | Wt 154.0 lb

## 2014-12-20 DIAGNOSIS — D125 Benign neoplasm of sigmoid colon: Secondary | ICD-10-CM | POA: Diagnosis not present

## 2014-12-20 DIAGNOSIS — D122 Benign neoplasm of ascending colon: Secondary | ICD-10-CM | POA: Diagnosis not present

## 2014-12-20 DIAGNOSIS — D124 Benign neoplasm of descending colon: Secondary | ICD-10-CM | POA: Diagnosis not present

## 2014-12-20 DIAGNOSIS — Z86010 Personal history of colonic polyps: Secondary | ICD-10-CM | POA: Diagnosis present

## 2014-12-20 LAB — GLUCOSE, CAPILLARY
GLUCOSE-CAPILLARY: 84 mg/dL (ref 65–99)
Glucose-Capillary: 84 mg/dL (ref 65–99)

## 2014-12-20 MED ORDER — SODIUM CHLORIDE 0.9 % IV SOLN: 500.0000 mL | INTRAVENOUS | Status: DC

## 2014-12-20 NOTE — Patient Instructions (Addendum)
I found and removed 3 small polyps that look benign. You also have a condition called diverticulosis - common and not usually a problem. Please read the handout provided.  I will let you know pathology results and when to have another routine colonoscopy by mail.  I appreciate the opportunity to care for you. Gatha Mayer, MD, [facility]  Discharge instructions given. Handouts on polyps and diverticulosis. Resume previous medications. YOU HAD AN ENDOSCOPIC PROCEDURE TODAY AT [facility]:   Refer to the procedure report that was given to you for any specific questions about what was found during the examination.  If the procedure report does not answer your questions, please call your gastroenterologist to clarify.  If you requested that your care partner not be given the details of your procedure findings, then the procedure report has been included in a sealed envelope for you to review at your convenience later.  YOU SHOULD EXPECT: Some feelings of bloating in the abdomen. Passage of more gas than usual.  Walking can help get rid of the air that was put into your GI tract during the procedure and reduce the bloating. If you had a lower endoscopy (such as a colonoscopy or flexible sigmoidoscopy) you may notice spotting of blood in your stool or on the toilet paper. If you underwent a bowel prep for your procedure, you may not have a normal bowel movement for a few days.  Please Note:  You might notice some irritation and congestion in your nose or some drainage.  This is from the oxygen used during your procedure.  There is no need for concern and it should clear up in a day or so.  SYMPTOMS TO REPORT IMMEDIATELY:   Following lower endoscopy (colonoscopy or flexible sigmoidoscopy):  Excessive amounts of blood in the stool  Significant tenderness or worsening of abdominal pains  Swelling of the abdomen that is new, acute  Fever of 100F or higher   For urgent or  emergent issues, a gastroenterologist can be reached at any hour by calling [phone].   DIET: Your first meal following the procedure should be a small meal and then it is ok to progress to your normal diet. Heavy or fried foods are harder to digest and may make you feel nauseous or bloated.  Likewise, meals heavy in dairy and vegetables can increase bloating.  Drink plenty of fluids but you should avoid alcoholic beverages for 24 hours.  ACTIVITY:  You should plan to take it easy for the rest of today and you should NOT DRIVE or use heavy machinery until tomorrow (because of the sedation medicines used during the test).    FOLLOW UP: Our staff will call the number listed on your records the next business day following your procedure to check on you and address any questions or concerns that you may have regarding the information given to you following your procedure. If we do not reach you, we will leave a message.  However, if you are feeling well and you are not experiencing any problems, there is no need to return our call.  We will assume that you have returned to your regular daily activities without incident.  If any biopsies were taken you will be contacted by phone or by letter within the next 1-3 weeks.  Please call us at [phone] if you have not heard about the biopsies in 3 weeks.    SIGNATURES/CONFIDENTIALITY: You and/or your care partner have signed paperwork  which will be entered into your electronic medical record.  These signatures attest to the fact that that the information above on your After Visit Summary has been reviewed and is understood.  Full responsibility of the confidentiality of this discharge information lies with you and/or your care-partner.

## 2014-12-20 NOTE — Progress Notes (Signed)
Called to room to assist during endoscopic procedure.  Patient ID and intended procedure confirmed with present staff. Received instructions for my participation in the procedure from the performing physician.  

## 2014-12-20 NOTE — Op Note (Signed)
[place]  [address] [place], [id number]   COLONOSCOPY PROCEDURE REPORT  PATIENT: Peter York, Peter York  MR#: [id number] BIRTHDATE: 01/21/1946 , 28  yrs. old GENDER: male ENDOSCOPIST: Gatha Mayer, MD, [facility] PROCEDURE DATE:  12/20/2014 PROCEDURE:   Colonoscopy with snare polypectomy and Colonoscopy, surveillance First Screening Colonoscopy - Avg.  risk and is 50 yrs.  old or older - No.  Prior Negative Screening - Now for repeat screening. N/A  History of Adenoma - Now for follow-up colonoscopy & has been > or = to 3 yrs.  Yes hx of adenoma.  Has been 3 or more years since last colonoscopy.  Polyps removed today? Yes ASA CLASS:   Class II INDICATIONS:Surveillance due to prior colonic neoplasia and PH Colon Adenoma. MEDICATIONS: Propofol 200 mg IV and Monitored anesthesia care  DESCRIPTION OF PROCEDURE:   After the risks benefits and alternatives of the procedure were thoroughly explained, informed consent was obtained.  The digital rectal exam revealed no abnormalities of the rectum.   The     endoscope was introduced through the anus and advanced to the cecum, which was identified by both the appendix and ileocecal valve. No adverse events experienced.   The quality of the prep was good.  (MiraLax was used)  The instrument was then slowly withdrawn as the colon was fully examined. Estimated blood loss is zero unless otherwise noted in this procedure report.   COLON FINDINGS: Three polypoid shaped sessile polyps ranging from 3 to 15mm in size were found in the ascending colon, descending colon, and sigmoid colon.  Polypectomies were performed with a cold snare. The resection was complete, the polyp tissue was completely retrieved and sent to histology.   There was diverticulosis noted in the sigmoid colon.   The examination was otherwise normal. Right colon retroflexion included.  Retroflexed views revealed no abnormalities. The time to cecum = 2.8 Withdrawal  time = 10.9   The scope was withdrawn and the procedure completed. COMPLICATIONS: There were no immediate complications.  ENDOSCOPIC IMPRESSION: 1.   Three sessile polyps ranging from 3 to 65mm in size were found in the ascending colon, descending colon, and sigmoid colon; polypectomies were performed with a cold snare 2.   Diverticulosis was noted in the sigmoid colon 3.   The examination was otherwise normal  RECOMMENDATIONS: Timing of repeat colonoscopy will be determined by pathology findings.  eSigned:  Gatha Mayer, MD, [facility] 12/20/2014 11:39 AM   cc: The Patient

## 2014-12-20 NOTE — Progress Notes (Signed)
Report to PACU, RN, vss, BBS= Clear.  

## 2014-12-21 ENCOUNTER — Telehealth: Payer: Self-pay | Admitting: *Deleted

## 2014-12-21 NOTE — Telephone Encounter (Signed)
  Follow up Call-  Call back number 12/20/2014  Post procedure Call Back phone  # [phone]  Permission to leave phone message Yes     Patient questions:  Do you have a fever, pain , or abdominal swelling? No. Pain Score  0 *  Have you tolerated food without any problems? Yes.    Have you been able to return to your normal activities? Yes.    Do you have any questions about your discharge instructions: Diet   No. Medications  No. Follow up visit  No.  Do you have questions or concerns about your Care? No.  Actions: * If pain score is 4 or above: No action needed, pain <4. Pt did admit that  he had passed about a tablespoon of blood . Instructed pt to call back if this does not get less and less or bleeds more than that.

## 2014-12-26 ENCOUNTER — Encounter: Payer: Self-pay | Admitting: Internal Medicine

## 2014-12-26 DIAGNOSIS — Z860100 Personal history of colon polyps, unspecified: Secondary | ICD-10-CM

## 2014-12-26 NOTE — Progress Notes (Signed)
Quick Note:  3 diminutive adenomas - repeat colonoscopy 2019 ______ 

## 2015-01-29 ENCOUNTER — Encounter: Payer: Self-pay | Admitting: Family Medicine

## 2015-01-29 ENCOUNTER — Ambulatory Visit (INDEPENDENT_AMBULATORY_CARE_PROVIDER_SITE_OTHER): Payer: Medicare Other | Admitting: Family Medicine

## 2015-01-29 VITALS — BP 165/88 | HR 61 | Temp 96.9°F | Ht 68.0 in | Wt 154.8 lb

## 2015-01-29 DIAGNOSIS — I10 Essential (primary) hypertension: Secondary | ICD-10-CM | POA: Diagnosis not present

## 2015-01-29 DIAGNOSIS — Z23 Encounter for immunization: Secondary | ICD-10-CM | POA: Diagnosis not present

## 2015-01-29 DIAGNOSIS — S8012XA Contusion of left lower leg, initial encounter: Secondary | ICD-10-CM

## 2015-01-29 DIAGNOSIS — E119 Type 2 diabetes mellitus without complications: Secondary | ICD-10-CM

## 2015-01-29 DIAGNOSIS — D649 Anemia, unspecified: Secondary | ICD-10-CM | POA: Diagnosis not present

## 2015-01-29 DIAGNOSIS — L03116 Cellulitis of left lower limb: Secondary | ICD-10-CM

## 2015-01-29 DIAGNOSIS — S81802A Unspecified open wound, left lower leg, initial encounter: Secondary | ICD-10-CM | POA: Diagnosis not present

## 2015-01-29 MED ORDER — SULFAMETHOXAZOLE-TRIMETHOPRIM 800-160 MG PO TABS: 1.0000 | ORAL_TABLET | Freq: Two times a day (BID) | ORAL | Status: DC

## 2015-01-29 NOTE — Progress Notes (Signed)
Subjective:    Patient ID: Peter York, male    DOB: 08-Jul-1945, 69 y.o.   MRN: [id number]  HPI Patient is here today with left leg swelling, red and hot. Patient was kicked in the leg 2 weeks ago by a horse. The patient has a history of diabetes and hypertension. He indicates that he's had trouble controlling his blood pressure and follows of this very closely. His readings at home usually run in the 160 range. He does not take his Lasix regularly nor does he take his morning clonidine regularly. The leg wound has an abrasion over it and he has had edema in the feet and bruising settling in his left foot.   Review of Systems  Constitutional: Negative.   HENT: Negative.   Eyes: Negative.   Respiratory: Negative.   Cardiovascular: Positive for leg swelling.  Gastrointestinal: Negative.   Endocrine: Negative.   Genitourinary: Negative.   Musculoskeletal: Negative.   Skin:       Left leg red and hot   Allergic/Immunologic: Negative.   Neurological: Negative.   Hematological: Negative.   Psychiatric/Behavioral: Negative.        Patient Active Problem List   Diagnosis Date Noted  . Type 2 diabetes mellitus without complication ([place]) 77/41/2878  . Hyperlipidemia with target LDL less than 100 10/06/2012  . Hypertension 10/06/2012  . Personal history of adenomatous colonic polyps 07/19/2011   Outpatient Encounter Prescriptions as of 01/29/2015  Medication Sig  . amLODipine (NORVASC) 5 MG tablet Take 1 tablet (5 mg total) by mouth daily.  [name] aspirin 81 MG tablet Take 81 mg by mouth daily.  . benazepril (LOTENSIN) 40 MG tablet TAKE 1 TABLET (40 MG TOTAL) BY MOUTH DAILY.  . cloNIDine (CATAPRES) 0.3 MG tablet TAKE 1 TABLET (0.3 MG TOTAL) BY MOUTH 2 (TWO) TIMES DAILY.  . furosemide (LASIX) 40 MG tablet TAKE 1 TABLET (40 MG TOTAL) BY MOUTH 2 (TWO) TIMES DAILY. (Patient taking differently: as needed. TAKE 1 TABLET (40 MG TOTAL) BY MOUTH 2 (TWO) TIMES DAILY.)  . metFORMIN (GLUCOPHAGE)  500 MG tablet TAKE 1 TABLET EVERY MORNING AND 2 TABLET AT BEDTIME  . Omega-3 Fatty Acids (FISH OIL) 1000 MG CAPS Take 2,000 mg by mouth 2 (two) times daily.  . ONE TOUCH ULTRA TEST test strip USE TO CHECK BLOOD SUGAR ONCE A DAY AS INSTRUCTED  . vitamin E 400 UNIT capsule Take 400 Units by mouth daily.  . [DISCONTINUED] colesevelam (WELCHOL) 625 MG tablet Take 3 tablets (1,875 mg total) by mouth 2 (two) times daily with a meal. (Patient not taking: Reported on 01/29/2015)  . [DISCONTINUED] Krill Oil 300 MG CAPS Take by mouth.  . [DISCONTINUED] Omega 3-6-9 CAPS Take by mouth.   No facility-administered encounter medications on file as of 01/29/2015.       Objective:   Physical Exam  Constitutional: He is oriented to person, place, and time. He appears well-developed and well-nourished. No distress.  HENT:  Head: Normocephalic and atraumatic.  Eyes: Conjunctivae and EOM are normal. Pupils are equal, round, and reactive to light. Right eye exhibits no discharge. Left eye exhibits no discharge. No scleral icterus.  Neck: Normal range of motion. Neck supple. No thyromegaly present.  Cardiovascular: Normal rate, regular rhythm and normal heart sounds.   Pulmonary/Chest: Effort normal and breath sounds normal. No respiratory distress. He has no wheezes. He has no rales. He exhibits no tenderness.  Abdominal: Soft. Bowel sounds are normal. He exhibits no mass. There is  no tenderness. There is no rebound and no guarding.  Genitourinary: Rectum normal.  Musculoskeletal: Normal range of motion. He exhibits no edema.  Lymphadenopathy:    He has no cervical adenopathy.  Neurological: He is alert and oriented to person, place, and time.  Skin: Skin is warm and dry. No rash noted. There is erythema. No pallor.  Large vertical abrasion and wound of the left anterior leg with a large hematoma beneath the wound. There is some rubor and some redness. There is minimal surrounding cellulitis.  Psychiatric: He  has a normal mood and affect. His behavior is normal. Judgment and thought content normal.  Nursing note and vitals reviewed.   BP 165/88 mmHg  Pulse 61  Temp(Src) 96.9 F (36.1 C) (Oral)  Ht 5\' 8"  (1.727 m)  Wt 154 lb 12.8 oz (70.217 kg)  BMI 23.54 kg/m2  Repeat blood pressure was 176/90 in the left arm sitting with a large cuff     Assessment & Plan:  1. Cellulitis of left lower extremity -CBC - sulfamethoxazole-trimethoprim (BACTRIM DS,SEPTRA DS) 800-160 MG tablet; Take 1 tablet by mouth 2 (two) times daily.  Dispense: 28 tablet; Refill: 1  2. Hematoma of left lower extremity, initial encounter -Continue to keep area cleansed and protected from getting infected  3. Wound of left lower extremity, initial encounter -Continue to clean area with warm soapy water and use dressing it out and exposed to irritating environments - sulfamethoxazole-trimethoprim (BACTRIM DS,SEPTRA DS) 800-160 MG tablet; Take 1 tablet by mouth 2 (two) times daily.  Dispense: 28 tablet; Refill: 1  4. Essential hypertension -Monitor blood pressures closely and bring readings for review -Increase the clonidine to one half of a 0.2 mg in the morning and take Lasix 20 one daily  5. Type 2 diabetes mellitus without complication, without long-term current use of insulin (HCC) -Monitor blood sugars closely  Patient Instructions  The patient should take his Lasix 40 mg one half daily He should take the clonidine 0.3 mg at bedtime and one half of the 0.2 mg in the morning He should continue to watch his sodium intake He should monitor his blood pressures closely and bring these readings back to the next visit We will call with the lab work results as soon as they become available He should keep the wound of his leg clean with warm soapy water and call us if there is any increasing redness away from the wound   Arrie Senate MD

## 2015-01-29 NOTE — Addendum Note (Signed)
Addended by: Thana Ates on: 01/29/2015 09:22 AM   Modules accepted: Orders

## 2015-01-29 NOTE — Addendum Note (Signed)
Addended by: Pollyann Kennedy F on: 01/29/2015 09:06 AM   Modules accepted: Orders

## 2015-01-29 NOTE — Patient Instructions (Addendum)
The patient should take his Lasix 40 mg one half daily He should take the clonidine 0.3 mg at bedtime and one half of the 0.2 mg in the morning He should continue to watch his sodium intake He should monitor his blood pressures closely and bring these readings back to the next visit We will call with the lab work results as soon as they become available He should keep the wound of his leg clean with warm soapy water and call us if there is any increasing redness away from the wound

## 2015-01-29 NOTE — Addendum Note (Signed)
Addended by: Pollyann Kennedy F on: 01/29/2015 09:11 AM   Modules accepted: Orders

## 2015-01-30 LAB — CBC WITH DIFFERENTIAL/PLATELET
BASOS ABS: 0 10*3/uL (ref 0.0–0.2)
Basos: 1 %
EOS (ABSOLUTE): 0.2 10*3/uL (ref 0.0–0.4)
Eos: 3 %
HEMOGLOBIN: 10.2 g/dL — AB (ref 12.6–17.7)
Hematocrit: 30.5 % — ABNORMAL LOW (ref 37.5–51.0)
IMMATURE GRANS (ABS): 0 10*3/uL (ref 0.0–0.1)
Immature Granulocytes: 0 %
LYMPHS: 18 %
Lymphocytes Absolute: 0.8 10*3/uL (ref 0.7–3.1)
MCH: 30.4 pg (ref 26.6–33.0)
MCHC: 33.4 g/dL (ref 31.5–35.7)
MCV: 91 fL (ref 79–97)
MONOCYTES: 8 %
Monocytes Absolute: 0.4 10*3/uL (ref 0.1–0.9)
NEUTROS PCT: 70 %
Neutrophils Absolute: 3.3 10*3/uL (ref 1.4–7.0)
PLATELETS: 301 10*3/uL (ref 150–379)
RBC: 3.36 x10E6/uL — AB (ref 4.14–5.80)
RDW: 13.8 % (ref 12.3–15.4)
WBC: 4.7 10*3/uL (ref 3.4–10.8)

## 2015-01-30 LAB — BMP8+EGFR
BUN/Creatinine Ratio: 14 (ref 10–22)
BUN: 16 mg/dL (ref 8–27)
CALCIUM: 8.8 mg/dL (ref 8.6–10.2)
CHLORIDE: 102 mmol/L (ref 97–106)
CO2: 25 mmol/L (ref 18–29)
Creatinine, Ser: 1.13 mg/dL (ref 0.76–1.27)
GFR calc non Af Amer: 66 mL/min/{1.73_m2} (ref >59)
GFR, EST AFRICAN AMERICAN: 76 mL/min/{1.73_m2} (ref >59)
GLUCOSE: 134 mg/dL — AB (ref 65–99)
Potassium: 4.6 mmol/L (ref 3.5–5.2)
Sodium: 141 mmol/L (ref 136–144)

## 2015-02-02 NOTE — Addendum Note (Signed)
Addended by: Thana Ates on: 02/02/2015 08:29 AM   Modules accepted: Orders

## 2015-02-03 ENCOUNTER — Other Ambulatory Visit: Payer: Medicare Other

## 2015-02-03 DIAGNOSIS — Z1212 Encounter for screening for malignant neoplasm of rectum: Secondary | ICD-10-CM

## 2015-02-03 NOTE — Progress Notes (Signed)
Lab only 

## 2015-02-05 LAB — FECAL OCCULT BLOOD, IMMUNOCHEMICAL: Fecal Occult Bld: NEGATIVE

## 2015-02-07 ENCOUNTER — Encounter: Payer: Self-pay | Admitting: Nurse Practitioner

## 2015-02-07 ENCOUNTER — Ambulatory Visit (INDEPENDENT_AMBULATORY_CARE_PROVIDER_SITE_OTHER): Payer: Medicare Other | Admitting: Nurse Practitioner

## 2015-02-07 VITALS — BP 147/76 | HR 58 | Temp 97.6°F | Ht 68.0 in | Wt 155.0 lb

## 2015-02-07 DIAGNOSIS — L03116 Cellulitis of left lower limb: Secondary | ICD-10-CM

## 2015-02-07 NOTE — Progress Notes (Signed)
   Subjective:    Patient ID: Peter York, male    DOB: 1945/05/11, 69 y.o.   MRN: [id number]  HPI Patient saw Dr. Laurance Flatten on 01/29/15- dx with cellulitis of left leg from a horse kicking him. He was given bactrim nad he say sit has gotten much better.    Review of Systems  Constitutional: Negative.   HENT: Negative.   Respiratory: Negative.   Cardiovascular: Negative.   Gastrointestinal: Negative.   Genitourinary: Negative.   Neurological: Negative.   Psychiatric/Behavioral: Negative.   All other systems reviewed and are negative.      Objective:   Physical Exam  Constitutional: He is oriented to person, place, and time. He appears well-developed and well-nourished.  Cardiovascular: Normal rate, regular rhythm and normal heart sounds.   Pulmonary/Chest: Effort normal.  Neurological: He is alert and oriented to person, place, and time.  Skin: Skin is warm and dry.  7cm by 2cm scabbed over lesion and 2cm by 3cm scabbed over lesion on left shin. No surrounding erythema Or edema- skin peeling all around.    BP 147/76 mmHg  Pulse 58  Temp(Src) 97.6 F (36.4 C) (Oral)  Ht 5\' 8"  (1.727 m)  Wt 155 lb (70.308 kg)  BMI 23.57 kg/m2       Assessment & Plan:   1. Cellulitis of left lower extremity    Finish antibiotic Watch area RTO prn  Mary-Margaret Hassell Done, FNP

## 2015-03-15 ENCOUNTER — Encounter: Payer: Self-pay | Admitting: Nurse Practitioner

## 2015-03-15 ENCOUNTER — Ambulatory Visit (INDEPENDENT_AMBULATORY_CARE_PROVIDER_SITE_OTHER): Payer: Medicare Other | Admitting: Nurse Practitioner

## 2015-03-15 VITALS — BP 148/90 | HR 49 | Temp 96.9°F | Ht 68.0 in | Wt 158.0 lb

## 2015-03-15 DIAGNOSIS — Z1159 Encounter for screening for other viral diseases: Secondary | ICD-10-CM | POA: Diagnosis not present

## 2015-03-15 DIAGNOSIS — I10 Essential (primary) hypertension: Secondary | ICD-10-CM

## 2015-03-15 DIAGNOSIS — E119 Type 2 diabetes mellitus without complications: Secondary | ICD-10-CM | POA: Diagnosis not present

## 2015-03-15 DIAGNOSIS — E785 Hyperlipidemia, unspecified: Secondary | ICD-10-CM | POA: Diagnosis not present

## 2015-03-15 LAB — POCT GLYCOSYLATED HEMOGLOBIN (HGB A1C): HEMOGLOBIN A1C: 5.8

## 2015-03-15 MED ORDER — BENAZEPRIL HCL 40 MG PO TABS: ORAL_TABLET | ORAL | Status: DC

## 2015-03-15 MED ORDER — CLONIDINE HCL 0.3 MG PO TABS: ORAL_TABLET | ORAL | Status: DC

## 2015-03-15 MED ORDER — AMLODIPINE BESYLATE 5 MG PO TABS: 5.0000 mg | ORAL_TABLET | Freq: Every day | ORAL | Status: DC

## 2015-03-15 MED ORDER — FUROSEMIDE 40 MG PO TABS: ORAL_TABLET | ORAL | Status: DC

## 2015-03-15 MED ORDER — METFORMIN HCL 500 MG PO TABS: ORAL_TABLET | ORAL | Status: DC

## 2015-03-15 NOTE — Patient Instructions (Signed)

## 2015-03-15 NOTE — Progress Notes (Signed)
Subjective:    Patient ID: Peter York, male    DOB: 1945/08/15, 69 y.o.   MRN: [id number]  Patient here today for follow up of chronic medical problems. HAS NOT TAKEN HIS MEDICINE TODAY!  Hypertension This is a chronic problem. The current episode started more than 1 year ago. The problem has been waxing and waning since onset. The problem is controlled. Pertinent negatives include no chest pain, headaches, neck pain, palpitations or shortness of breath. Risk factors for coronary artery disease include dyslipidemia and male gender. Past treatments include calcium channel blockers, ACE inhibitors, central alpha agonists and diuretics. The current treatment provides significant improvement. There are no compliance problems.   Hyperlipidemia This is a chronic problem. The current episode started more than 1 year ago. The problem is controlled. Recent lipid tests were reviewed and are normal. Exacerbating diseases include diabetes. Factors aggravating his hyperlipidemia include smoking. Pertinent negatives include no chest pain or shortness of breath. He is currently on no antihyperlipidemic treatment. The current treatment provides no improvement of lipids. There are no compliance problems.  Risk factors for coronary artery disease include diabetes mellitus and male sex.  Diabetes He presents for his follow-up diabetic visit. He has type 2 diabetes mellitus. No MedicAlert identification noted. His disease course has been stable. Pertinent negatives for hypoglycemia include no headaches. Pertinent negatives for diabetes include no chest pain, no polydipsia, no polyphagia, no polyuria and no visual change. There are no hypoglycemic complications. There are no diabetic complications. Risk factors for coronary artery disease include dyslipidemia, family history, hypertension and obesity. Current diabetic treatment includes oral agent (monotherapy). His weight is stable. He is following a diabetic diet. When  asked about meal planning, he reported none. He has not had a previous visit with a dietitian. He participates in exercise daily. There is no change in his home blood glucose trend. His breakfast blood glucose is taken between 9-10 am. His breakfast blood glucose range is generally 130-140 mg/dl. His overall blood glucose range is 130-140 mg/dl. An ACE inhibitor/angiotensin II receptor blocker is being taken. He does not see a podiatrist.Eye exam is not current.      Review of Systems  Respiratory: Negative for shortness of breath.   Cardiovascular: Negative for chest pain and palpitations.  Endocrine: Negative for polydipsia, polyphagia and polyuria.  Musculoskeletal: Negative for neck pain.  Neurological: Negative for headaches.  All other systems reviewed and are negative.      Objective:   Physical Exam  Constitutional: He is oriented to person, place, and time. He appears well-developed and well-nourished.  HENT:  Head: Normocephalic.  Right Ear: External ear normal.  Left Ear: External ear normal.  Nose: Nose normal.  Mouth/Throat: Oropharynx is clear and moist.  Eyes: EOM are normal. Pupils are equal, round, and reactive to light.  Neck: Normal range of motion. Neck supple. No JVD present. No thyromegaly present.  Cardiovascular: Normal rate, regular rhythm, normal heart sounds and intact distal pulses.  Exam reveals no gallop and no friction rub.   No murmur heard. Pulmonary/Chest: Effort normal and breath sounds normal. No respiratory distress. He has no wheezes. He has no rales. He exhibits no tenderness.  Abdominal: Soft. Bowel sounds are normal. He exhibits no mass. There is no tenderness.  Genitourinary: Prostate normal and penis normal.  Musculoskeletal: Normal range of motion. He exhibits no edema.  Lymphadenopathy:    He has no cervical adenopathy.  Neurological: He is alert and oriented to person, place, and  time. No cranial nerve deficit.  Skin: Skin is warm and  dry.  Psychiatric: He has a normal mood and affect. His behavior is normal. Judgment and thought content normal.    BP 148/90 mmHg  Pulse 49  Temp(Src) 96.9 F (36.1 C) (Oral)  Ht 5' 8"  (1.727 m)  Wt 158 lb (71.668 kg)  BMI 24.03 kg/m2   Results for orders placed or performed in visit on 03/15/15  POCT glycosylated hemoglobin (Hb A1C)  Result Value Ref Range   Hemoglobin A1C 5.8          Assessment & Plan:  1. Essential hypertension Do not add salt to diet - CMP14+EGFR  2. Type 2 diabetes mellitus without complication, without long-term current use of insulin (HCC) Continue carb counting - POCT glycosylated hemoglobin (Hb A1C) - Microalbumin / creatinine urine ratio  3. Hyperlipidemia with target LDL less than 100 Low fat diet and exercise - Lipid panel   Labs pending Health maintenance reviewed Diet and exercise encouraged Continue all meds Follow up  In 3 month [place], FNP

## 2015-03-16 ENCOUNTER — Encounter: Payer: Self-pay | Admitting: *Deleted

## 2015-03-16 LAB — CMP14+EGFR
A/G RATIO: 1.5 (ref 1.1–2.5)
ALBUMIN: 3.8 g/dL (ref 3.6–4.8)
ALT: 12 IU/L (ref 0–44)
AST: 18 IU/L (ref 0–40)
Alkaline Phosphatase: 56 IU/L (ref 39–117)
BUN/Creatinine Ratio: 15 (ref 10–22)
BUN: 17 mg/dL (ref 8–27)
Bilirubin Total: 0.6 mg/dL (ref 0.0–1.2)
CALCIUM: 9.2 mg/dL (ref 8.6–10.2)
CO2: 23 mmol/L (ref 18–29)
Chloride: 102 mmol/L (ref 96–106)
Creatinine, Ser: 1.11 mg/dL (ref 0.76–1.27)
GFR, EST AFRICAN AMERICAN: 78 mL/min/{1.73_m2} (ref >59)
GFR, EST NON AFRICAN AMERICAN: 67 mL/min/{1.73_m2} (ref >59)
GLOBULIN, TOTAL: 2.6 g/dL (ref 1.5–4.5)
Glucose: 131 mg/dL — ABNORMAL HIGH (ref 65–99)
POTASSIUM: 5.3 mmol/L — AB (ref 3.5–5.2)
Sodium: 139 mmol/L (ref 134–144)
TOTAL PROTEIN: 6.4 g/dL (ref 6.0–8.5)

## 2015-03-16 LAB — LIPID PANEL
CHOL/HDL RATIO: 5.1 ratio — AB (ref 0.0–5.0)
Cholesterol, Total: 249 mg/dL — ABNORMAL HIGH (ref 100–199)
HDL: 49 mg/dL (ref >39)
LDL Calculated: 179 mg/dL — ABNORMAL HIGH (ref 0–99)
Triglycerides: 104 mg/dL (ref 0–149)
VLDL Cholesterol Cal: 21 mg/dL (ref 5–40)

## 2015-03-16 LAB — MICROALBUMIN / CREATININE URINE RATIO
CREATININE, UR: 78.1 mg/dL
MICROALB/CREAT RATIO: 869.7 mg/g creat — ABNORMAL HIGH (ref 0.0–30.0)
Microalbumin, Urine: 679.2 ug/mL

## 2015-03-16 LAB — HEPATITIS C ANTIBODY

## 2015-04-07 ENCOUNTER — Ambulatory Visit (INDEPENDENT_AMBULATORY_CARE_PROVIDER_SITE_OTHER): Payer: PPO | Admitting: Cardiology

## 2015-04-07 ENCOUNTER — Encounter: Payer: Self-pay | Admitting: Cardiology

## 2015-04-07 VITALS — BP 152/82 | HR 58 | Ht 68.0 in | Wt 155.0 lb

## 2015-04-07 DIAGNOSIS — E119 Type 2 diabetes mellitus without complications: Secondary | ICD-10-CM

## 2015-04-07 DIAGNOSIS — I10 Essential (primary) hypertension: Secondary | ICD-10-CM

## 2015-04-07 DIAGNOSIS — E785 Hyperlipidemia, unspecified: Secondary | ICD-10-CM | POA: Diagnosis not present

## 2015-04-07 DIAGNOSIS — Z8249 Family history of ischemic heart disease and other diseases of the circulatory system: Secondary | ICD-10-CM | POA: Diagnosis not present

## 2015-04-07 MED ORDER — AMLODIPINE BESYLATE 10 MG PO TABS: 10.0000 mg | ORAL_TABLET | Freq: Every day | ORAL | Status: DC

## 2015-04-07 MED ORDER — CLONIDINE HCL 0.1 MG PO TABS: 0.1000 mg | ORAL_TABLET | Freq: Two times a day (BID) | ORAL | Status: DC

## 2015-04-07 NOTE — Patient Instructions (Signed)
Medication Instructions:  Your physician has recommended you make the following change in your medication:  1) DECREASE CLONIDINE to 0.1 mg TWICE DAILY 2) INCREASE AMLODIPINE to 10 mg daily  Labwork: None  Testing/Procedures: Dr. Radford Pax recommends you have a NUCLEAR STRESS TEST.  Follow-Up: Your physician recommends that you schedule a follow-up appointment in [place] with Gay Filler in the HTN Clinic.  Your physician recommends that you schedule a follow-up appointment in 3 MONTHS with Dr. Radford Pax.  Any Other Special Instructions Will Be Listed Below (If Applicable).     If you need a refill on your cardiac medications before your next appointment, please call your pharmacy.

## 2015-04-07 NOTE — Progress Notes (Signed)
Cardiology Office Note   Date:  04/07/2015   ID:  Peter York, DOB Jun 22, 1945, MRN [id number]  PCP:  Chevis Pretty, FNP    Chief Complaint  Patient presents with  . Hypertension      History of Present Illness: Peter York is a 70 y.o. male who presents for evaluation of HTN.  He has a history of HTN, DM and dyslipidemia. [name], Benazepril and Clonidine.  He says that his BP is very labile.  When he takes his clonidine and benazepril his BP will drop and he feels bad.  The Clonidine makes his head very foggy.   He has denies any chest pain, SOB, DOE, LE edema, dizziness, palpitations or syncope.     Past Medical History  Diagnosis Date  . Adenomatous colon polyp 12/31/2005  . Diabetes mellitus   . Hypercholesterolemia   . Hypertension     Past Surgical History  Procedure Laterality Date  . Colonoscopy w/ polypectomy  12/31/2005    Dr. Silvano Rusk  . Appendectomy    . Umbilical hernia repair    . Anal fistulectomy  10/02/2011    Procedure: FISTULECTOMY ANAL;  Surgeon: Joyice Faster. Cornett, MD;  Location: [facility];  Service: General;  Laterality: N/A;  excision perianal mass      Current Outpatient Prescriptions  Medication Sig Dispense Refill  . amLODipine (NORVASC) 5 MG tablet Take 1 tablet (5 mg total) by mouth daily. 90 tablet 3  . aspirin 81 MG tablet Take 81 mg by mouth daily.    . benazepril (LOTENSIN) 40 MG tablet TAKE 1 TABLET (40 MG TOTAL) BY MOUTH DAILY. 90 tablet 1  . cloNIDine (CATAPRES) 0.3 MG tablet TAKE 1 TABLET (0.3 MG TOTAL) BY MOUTH 2 (TWO) TIMES DAILY. 60 tablet 5  . furosemide (LASIX) 40 MG tablet TAKE 1 TABLET (40 MG TOTAL) BY MOUTH 2 (TWO) TIMES DAILY. 180 tablet 1  . metFORMIN (GLUCOPHAGE) 500 MG tablet TAKE 1 TABLET EVERY MORNING AND 2 TABLET AT BEDTIME 270 tablet 1  . Omega-3 Fatty Acids (FISH OIL) 1000 MG CAPS Take 2,000 mg by mouth 2 (two) times daily.    . ONE TOUCH ULTRA  TEST test strip USE TO CHECK BLOOD SUGAR ONCE A DAY AS INSTRUCTED 100 each 2  . vitamin E 400 UNIT capsule Take 400 Units by mouth daily.     No current facility-administered medications for this visit.    Allergies:   Review of patient's allergies indicates no known allergies.    Social History:  The patient  reports that he quit smoking about 37 years ago. He has never used smokeless tobacco. He reports that he drinks alcohol. He reports that he does not use illicit drugs.   Family History:  The patient's family history includes Cancer in his father; Colon cancer (age of onset: 39) in his father; Colon polyps in his father; Diabetes in his daughter; Heart attack in his maternal uncle; Heart disease in his maternal uncle, maternal uncle, and maternal uncle.    ROS:  Please see the history of present illness.   Otherwise, review of systems are positive for none.   All other systems are reviewed and negative.    PHYSICAL EXAM: VS:  BP 152/82 mmHg  Pulse 58  Ht 5\' 8"  (1.727 m)  Wt 155 lb (70.308 kg)  BMI 23.57 kg/m2 ,  BMI Body mass index is 23.57 kg/(m^2). GEN: Well nourished, well developed, in no acute distress HEENT: normal Neck: no JVD, carotid bruits, or masses Cardiac: RRR; no murmurs, rubs, or gallops,no edema  Respiratory:  clear to auscultation bilaterally, normal work of breathing GI: soft, nontender, nondistended, + BS MS: no deformity or atrophy Skin: warm and dry, no rash Neuro:  Strength and sensation are intact Psych: euthymic mood, full affect   EKG:  EKG is not ordered today.    Recent Labs: 01/29/2015: Platelets 301 03/15/2015: ALT 12; BUN 17; Creatinine, Ser 1.11; Potassium 5.3*; Sodium 139    Lipid Panel    Component Value Date/Time   CHOL 249* 03/15/2015 0820   CHOL 261* 03/09/2014 0818   CHOL 165 10/02/2012 0834   TRIG 104 03/15/2015 0820   TRIG 115 03/09/2014 0818   TRIG 95 10/02/2012 0834   HDL 49 03/15/2015 0820   HDL 43 03/09/2014 0818    HDL 51 10/02/2012 0834   CHOLHDL 5.1* 03/15/2015 0820   LDLCALC 179* 03/15/2015 0820   LDLCALC 223* 09/10/2013 0905   LDLCALC 95 10/02/2012 0834      Wt Readings from Last 3 Encounters:  04/07/15 155 lb (70.308 kg)  03/15/15 158 lb (71.668 kg)  02/07/15 155 lb (70.308 kg)      ASSESSMENT AND PLAN:  1.  HTN - borderline controlled.  He does not tolerate the clonidine well and would like to get weaned off.  I have recommended that we increase his amlodipine to 10mg  daily and decrease the clonidine to 0.1mg  BID.  He will followup in HTN clinic in 1 week.  The ultimate goal is to get him weaned off clonidine altogether.  If we cannot get him off of the clonidine completely then consider changing to the patch.  2.  Dyslipidemia - he is statin intolerant. 3.  Type II DM-per PCP 4.  Multiple cardiac risk factors for coronary disease including DM, Dyslipidemia, HTN, remote tobacco use and family history of CAD. EKG in June was nonischemic.   I will get a baseline stress myoview to rule out silent ischemia.      Current medicines are reviewed at length with the patient today.  The patient does not have concerns regarding medicines.  The following changes have been made:  no change  Labs/ tests ordered today: See above Assessment and Plan No orders of the defined types were placed in this encounter.     Disposition:   FU with me in 3 months  Signed, Sueanne Margarita, MD  04/07/2015 9:55 AM    [facility] [address], [place], [place]  [address] Phone: [phone]; Fax: [phone]

## 2015-04-13 ENCOUNTER — Telehealth (HOSPITAL_COMMUNITY): Payer: Self-pay | Admitting: *Deleted

## 2015-04-13 NOTE — Telephone Encounter (Signed)
Attempted to call patient regarding upcoming appointment- no answer, mailbox full. Hubbard Robinson, RN

## 2015-04-15 ENCOUNTER — Ambulatory Visit (INDEPENDENT_AMBULATORY_CARE_PROVIDER_SITE_OTHER): Payer: PPO | Admitting: Pharmacist

## 2015-04-15 ENCOUNTER — Ambulatory Visit (HOSPITAL_COMMUNITY): Payer: PPO | Attending: Cardiovascular Disease

## 2015-04-15 VITALS — BP 148/90 | HR 64

## 2015-04-15 DIAGNOSIS — E785 Hyperlipidemia, unspecified: Secondary | ICD-10-CM | POA: Insufficient documentation

## 2015-04-15 DIAGNOSIS — I10 Essential (primary) hypertension: Secondary | ICD-10-CM

## 2015-04-15 DIAGNOSIS — R9439 Abnormal result of other cardiovascular function study: Secondary | ICD-10-CM | POA: Diagnosis not present

## 2015-04-15 DIAGNOSIS — Z8249 Family history of ischemic heart disease and other diseases of the circulatory system: Secondary | ICD-10-CM | POA: Diagnosis not present

## 2015-04-15 DIAGNOSIS — E119 Type 2 diabetes mellitus without complications: Secondary | ICD-10-CM | POA: Insufficient documentation

## 2015-04-15 LAB — MYOCARDIAL PERFUSION IMAGING
CHL CUP NUCLEAR SRS: 5
CHL CUP RESTING HR STRESS: 53 {beats}/min
CSEPED: 8 min
CSEPEDS: 0 s
CSEPEW: 10.1 METS
LV dias vol: 126 mL
LV sys vol: 67 mL
MPHR: 151 {beats}/min
NUC STRESS TID: 0.96
Peak HR: 141 {beats}/min
Percent HR: 93 %
RATE: 0.24
SDS: 4
SSS: 9

## 2015-04-15 MED ORDER — TECHNETIUM TC 99M SESTAMIBI GENERIC - CARDIOLITE
10.5000 | Freq: Once | INTRAVENOUS | Status: AC | PRN
  Administered 2015-04-15: 11 via INTRAVENOUS

## 2015-04-15 MED ORDER — TECHNETIUM TC 99M SESTAMIBI GENERIC - CARDIOLITE
32.3000 | Freq: Once | INTRAVENOUS | Status: AC | PRN
  Administered 2015-04-15: 32.3 via INTRAVENOUS

## 2015-04-15 NOTE — Progress Notes (Signed)
Patient ID:  Peter York                DOB:  [birth year]                        MRN:  [id number]     HPI: Peter York is a 70 y.o. male referred by Dr. Radford Pax to HTN clinic. He has a history of hypertension, diabetes, and dyslipidemia. He was recently seen by Dr. Radford Pax for hypertension, at that appointment his amlodipine was increased from 5mg  to 10 mg and clonidine was decreased from 0.3mg  twice daily to 0.1mg  twice daily. He states he feels MUCH better after the reduction of clonidine. He states the clonidine and benazepril together makes him so he "can't go" by lunch. Patient wishes to be weaned completely off clonidine if possible. He reports that Dr. Radford Pax also told him to take furosemide only once daily (which he had been doing at home anyway). He states he feels better when his blood pressure runs in the 150s than when it is much lower 140s or 130s. He also reports that he has gotten headaches previously when his pressure was high, but has not had any in the last week. He states he notices that his blood pressure is higher with the colder weather and lower with the warmer weather.    Current HTN meds:  Clonidine 0.1mg  twice daily Amlodipine 10mg  in the evening Benazepril 40mg  in the morning Furosemide 40mg  in the morning  Previously tried:  Clonidine 0.3mg  twice daily  BP goal: <140/90  Social History: Former smoker; quit about 30 years ago. Alcohol socially only.   Diet: Eats about half his meals at home and half out. He does not add salt to his food. He drinks decaf coffee, but does drink 3-4 cans of diet soda per day.   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 He walks at least 1 hour a day.   Home BP readings: Has 2 arm cuffs at home one is approximately 5 years and the other is 70 years old. He most frequently uses the 70 year old cuff. He has been checking his blood pressure 8-10 times a day. Based on the trends he runs higher (170s/90s) in the morning  and late in the evening. Otherwise his readings are 130-140/70-80s.   Wt Readings from Last 3 Encounters:  04/07/15 155 lb (70.308 kg)  03/15/15 158 lb (71.668 kg)  02/07/15 155 lb (70.308 kg)   BP Readings from Last 3 Encounters:  04/15/15 148/90  04/07/15 152/82  03/15/15 148/90   Pulse Readings from Last 3 Encounters:  04/15/15 64  04/07/15 58  03/15/15 49    Renal function: CrCl cannot be calculated (Patient has no serum creatinine result on file.).  Past Medical History  Diagnosis Date  . Adenomatous colon polyp 12/31/2005  . Diabetes mellitus   . Hypercholesterolemia   . Hypertension     Current Outpatient Prescriptions on File Prior to Visit  Medication Sig Dispense Refill  . amLODipine (NORVASC) 10 MG tablet Take 1 tablet (10 mg total) by mouth daily. 30 tablet 11  . aspirin 81 MG tablet Take 81 mg by mouth daily.    . benazepril (LOTENSIN) 40 MG tablet TAKE 1 TABLET (40 MG TOTAL) BY MOUTH DAILY. 90 tablet 1  . cloNIDine (CATAPRES) 0.1 MG tablet Take 1 tablet (0.1 mg total) by mouth 2 (two) times daily. 60 tablet 11  .  furosemide (LASIX) 40 MG tablet TAKE 1 TABLET (40 MG TOTAL) BY MOUTH 2 (TWO) TIMES DAILY. (Patient taking differently: 40 mg daily. TAKE 1 TABLET (40 MG TOTAL) BY MOUTH 2 (TWO) TIMES DAILY.) 180 tablet 1  . metFORMIN (GLUCOPHAGE) 500 MG tablet TAKE 1 TABLET EVERY MORNING AND 2 TABLET AT BEDTIME 270 tablet 1  . Omega-3 Fatty Acids (FISH OIL) 1000 MG CAPS Take 2,000 mg by mouth 2 (two) times daily.    . ONE TOUCH ULTRA TEST test strip USE TO CHECK BLOOD SUGAR ONCE A DAY AS INSTRUCTED 100 each 2  . vitamin E 400 UNIT capsule Take 400 Units by mouth daily.     No current facility-administered medications on file prior to visit.    No Known Allergies   Assessment/Plan: Hypertension: Patient is borderline controlled at visit today. His home readings are mostly at goal after he takes his medications. Start taking clonidine 0.1mg  only in the evening.  Continue all other blood pressure medications as prescribed. Discussed lifestyle modifications to reduce caffeine intake and decreasing the number of times per day that the patient is checking his blood pressure. Instructed him to check 2 times per day unless he felt different or was having symptoms of high blood pressure. Instructed him to call the clinic with any issues and follow-up in 1 week for another blood pressure check.

## 2015-04-15 NOTE — Patient Instructions (Addendum)
Start taking clonidine 0.1mg  ONLY in the evening    Continue to monitor blood pressure at home, but only monitor 2 times daily unless you feel that your pressure may be high. Call the clinic if you experience headaches [phone].   Return for blood pressure check in 1 week (04/22/15 at 9:00am).

## 2015-04-21 ENCOUNTER — Telehealth: Payer: Self-pay

## 2015-04-21 DIAGNOSIS — R931 Abnormal findings on diagnostic imaging of heart and coronary circulation: Secondary | ICD-10-CM

## 2015-04-21 NOTE — Telephone Encounter (Signed)
Informed patient of results and verbal understanding expressed.  ECHO ordered for scheduling. Patient agrees with treatment plan. 

## 2015-04-21 NOTE — Telephone Encounter (Signed)
-----   Message from Sueanne Margarita, MD sent at 04/16/2015  8:14 PM EST ----- No ischemia but inferior defect - please get echo to assess for wall motion abnormality in inferior wall

## 2015-04-22 ENCOUNTER — Ambulatory Visit (INDEPENDENT_AMBULATORY_CARE_PROVIDER_SITE_OTHER): Payer: PPO | Admitting: Pharmacist

## 2015-04-22 VITALS — BP 134/86 | HR 58

## 2015-04-22 DIAGNOSIS — I10 Essential (primary) hypertension: Secondary | ICD-10-CM

## 2015-04-22 LAB — BASIC METABOLIC PANEL
BUN: 25 mg/dL (ref 7–25)
CHLORIDE: 102 mmol/L (ref 98–110)
CO2: 25 mmol/L (ref 20–31)
Calcium: 8.9 mg/dL (ref 8.6–10.3)
Creat: 1.27 mg/dL — ABNORMAL HIGH (ref 0.70–1.25)
GLUCOSE: 161 mg/dL — AB (ref 65–99)
POTASSIUM: 4.2 mmol/L (ref 3.5–5.3)
SODIUM: 138 mmol/L (ref 135–146)

## 2015-04-22 MED ORDER — CHLORTHALIDONE 25 MG PO TABS: 25.0000 mg | ORAL_TABLET | Freq: Every day | ORAL | Status: DC

## 2015-04-22 NOTE — Patient Instructions (Addendum)
Thank you for coming in today!  Start taking Chlorthalidone 25mg  once a day  Continue to monitor your blood pressure and check after medications occasionally.  Call the clinic with any issues [phone]. [place] or [employer]

## 2015-04-22 NOTE — Progress Notes (Signed)
Patient ID:  Peter York                DOB:  [birth year]                        MRN:  [id number]     HPI: Peter York is a 70 y.o. male referred by Dr. Radford Pax to HTN clinic. He has a history of hypertension, diabetes, and dyslipidemia. He was recently seen by Dr. Radford Pax for hypertension, at that appointment his amlodipine was increased from 5mg  to 10 mg and clonidine was decreased from 0.3mg  twice daily to 0.1mg  twice daily. He states he feels MUCH better after the reduction of clonidine. He states the clonidine and benazepril together makes him so he "can't go" by lunch. Patient wishes to be weaned completely off clonidine if possible. He states he notices that his blood pressure is higher with the colder weather and lower with the warmer weather. At his last visit with hypertension clinic his clonidine was again reduced to 0.1mg  only in the evening. He states he has been feeling great over the last week. He states he has been tolerating the clonidine at night and has not noticed any haziness or sleepiness since the dosing change.    Current HTN meds:  Clonidine 0.1mg  every evening Amlodipine 10mg  in the evening Benazepril 40mg  in the morning Furosemide 40mg  in the morning  Previously tried:  Clonidine 0.3mg  twice daily He states he has not tried any other blood pressure medications that he can recall  BP goal: <140/90  Social History: Former smoker; quit about 30 years ago. Alcohol socially only.   Diet: Eats about half his meals at home and half out. He does not add salt to his food. He drinks decaf coffee, but does drink 3-4 cans of diet soda per day.  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 He walks at least 1 hour a day.  Home BP readings: Has 2 arm cuffs at home one is approximately 5 years and the other is 70 years old. He most frequently uses the 70 year old cuff. He was  previously checking his blood pressure 8-10 times a day.  Over the last week with the reduction of clonidine his blood pressures have been mostly in 150s/80s. His highest readings are in the evening after dinner, just prior to taking his medications. Highest recorded: 180/88. The only time he checked after taking his medications he reports his blood pressure was 120ish/70ish.    Wt Readings from Last 3 Encounters:  04/07/15 155 lb (70.308 kg)  03/15/15 158 lb (71.668 kg)  02/07/15 155 lb (70.308 kg)   BP Readings from Last 3 Encounters:  04/22/15 134/86  04/15/15 148/90  04/07/15 152/82   Pulse Readings from Last 3 Encounters:  04/22/15 58  04/15/15 64  04/07/15 58    Renal function: CrCl cannot be calculated (Unknown ideal weight.).  Past Medical History  Diagnosis Date  . Adenomatous colon polyp 12/31/2005  . Diabetes mellitus   . Hypercholesterolemia   . Hypertension     Current Outpatient Prescriptions on File Prior to Visit  Medication Sig Dispense Refill  . amLODipine (NORVASC) 10 MG tablet Take 1 tablet (10 mg total) by mouth daily. 30 tablet 11  . aspirin 81 MG tablet Take 81 mg by mouth daily.    [name]  benazepril (LOTENSIN) 40 MG tablet TAKE 1 TABLET (40 MG TOTAL) BY MOUTH DAILY. 90 tablet 1  . cloNIDine (CATAPRES) 0.1 MG tablet Take 1 tablet (0.1 mg total) by mouth daily. 60 tablet 11  . furosemide (LASIX) 40 MG tablet TAKE 1 TABLET (40 MG TOTAL) BY MOUTH 2 (TWO) TIMES DAILY. (Patient taking differently: 40 mg daily. TAKE 1 TABLET (40 MG TOTAL) BY MOUTH 2 (TWO) TIMES DAILY.) 180 tablet 1  . metFORMIN (GLUCOPHAGE) 500 MG tablet TAKE 1 TABLET EVERY MORNING AND 2 TABLET AT BEDTIME 270 tablet 1  . Omega-3 Fatty Acids (FISH OIL) 1000 MG CAPS Take 2,000 mg by mouth 2 (two) times daily.    . ONE TOUCH ULTRA TEST test strip USE TO CHECK BLOOD SUGAR ONCE A DAY AS INSTRUCTED 100 each 2  . vitamin E 400 UNIT capsule Take 400 Units by mouth daily.     No current  facility-administered medications on file prior to visit.    No Known Allergies   Assessment/Plan: Hypertension: Patient is at goal in office today, but home readings have been running above goal. Start chlorthalidone 25mg  once daily for added blood pressure control in setting of potassium on the high side of normal. Avoiding beta-blocker as resting heart rate is in low 50s. Check BMET today for potassium. Follow-up in hypertension clinic in 3 weeks with ECHO appt.    Thank you, Lelan Pons. [place], [place]  [id number] [place], [place] [address]  Phone: [phone]; Fax: [phone] 04/22/2015 9:35 AM

## 2015-04-26 ENCOUNTER — Telehealth: Payer: Self-pay

## 2015-04-26 DIAGNOSIS — I10 Essential (primary) hypertension: Secondary | ICD-10-CM

## 2015-04-26 MED ORDER — FUROSEMIDE 20 MG PO TABS: 20.0000 mg | ORAL_TABLET | Freq: Every day | ORAL | Status: DC

## 2015-04-26 NOTE — Telephone Encounter (Signed)
-----   Message from Sueanne Margarita, MD sent at 04/24/2015 11:17 AM EST ----- Creatinine slightly increased - decrease Lasix to 20mg  daily and recheck BMET in 1 week

## 2015-04-26 NOTE — Telephone Encounter (Signed)
Informed patient of results and verbal understanding expressed.  Instructed patient to DECREASE LASIX to 20 mg daily. BMET to be checked 2/9. Patient agrees with treatment plan.

## 2015-05-04 ENCOUNTER — Ambulatory Visit: Payer: PPO | Admitting: Pharmacist

## 2015-05-05 ENCOUNTER — Ambulatory Visit (HOSPITAL_COMMUNITY): Payer: PPO | Attending: Cardiology

## 2015-05-05 ENCOUNTER — Other Ambulatory Visit (INDEPENDENT_AMBULATORY_CARE_PROVIDER_SITE_OTHER): Payer: PPO | Admitting: *Deleted

## 2015-05-05 ENCOUNTER — Ambulatory Visit (INDEPENDENT_AMBULATORY_CARE_PROVIDER_SITE_OTHER): Payer: PPO | Admitting: Pharmacist

## 2015-05-05 ENCOUNTER — Other Ambulatory Visit: Payer: Self-pay

## 2015-05-05 VITALS — BP 146/86 | HR 56

## 2015-05-05 DIAGNOSIS — E119 Type 2 diabetes mellitus without complications: Secondary | ICD-10-CM | POA: Diagnosis not present

## 2015-05-05 DIAGNOSIS — Z8249 Family history of ischemic heart disease and other diseases of the circulatory system: Secondary | ICD-10-CM | POA: Insufficient documentation

## 2015-05-05 DIAGNOSIS — I071 Rheumatic tricuspid insufficiency: Secondary | ICD-10-CM | POA: Insufficient documentation

## 2015-05-05 DIAGNOSIS — I517 Cardiomegaly: Secondary | ICD-10-CM | POA: Insufficient documentation

## 2015-05-05 DIAGNOSIS — E785 Hyperlipidemia, unspecified: Secondary | ICD-10-CM | POA: Diagnosis not present

## 2015-05-05 DIAGNOSIS — Z87891 Personal history of nicotine dependence: Secondary | ICD-10-CM | POA: Insufficient documentation

## 2015-05-05 DIAGNOSIS — I340 Nonrheumatic mitral (valve) insufficiency: Secondary | ICD-10-CM | POA: Insufficient documentation

## 2015-05-05 DIAGNOSIS — I358 Other nonrheumatic aortic valve disorders: Secondary | ICD-10-CM | POA: Diagnosis not present

## 2015-05-05 DIAGNOSIS — I10 Essential (primary) hypertension: Secondary | ICD-10-CM | POA: Diagnosis not present

## 2015-05-05 DIAGNOSIS — R931 Abnormal findings on diagnostic imaging of heart and coronary circulation: Secondary | ICD-10-CM

## 2015-05-05 LAB — BASIC METABOLIC PANEL
BUN: 44 mg/dL — ABNORMAL HIGH (ref 7–25)
CO2: 22 mmol/L (ref 20–31)
Calcium: 9.1 mg/dL (ref 8.6–10.3)
Chloride: 103 mmol/L (ref 98–110)
Creat: 1.45 mg/dL — ABNORMAL HIGH (ref 0.70–1.25)
GLUCOSE: 174 mg/dL — AB (ref 65–99)
Potassium: 4.3 mmol/L (ref 3.5–5.3)
SODIUM: 137 mmol/L (ref 135–146)

## 2015-05-05 NOTE — Progress Notes (Signed)
Patient ID: Peter York. Iwen                 DOB: [birth year]                         MRN: [id number]     HPI: Peter York is a 70 y.o. male referred by Dr. Radford Pax to HTN clinic who presents today for f/u. He has a history of hypertension, diabetes, and dyslipidemia. He was seen last month by Dr. Radford Pax for hypertension, at that appointment his amlodipine was increased from 5mg  to 10 mg and clonidine was decreased from 0.3mg  twice daily to 0.1mg  twice daily. He states he feels much better after the reduction of clonidine. He states the clonidine and benazepril together makes him so he "can't go" by lunch. He states he notices that his blood pressure is higher with the colder weather and lower with the warmer weather. At his last visit with hypertension clinic his clonidine was again reduced to 0.1mg  only in the evening and he was started on chlorthalidone 25 mg. He states he has been feeling great over the last month. He states he has been tolerating the clonidine at night and has not noticed any haziness or sleepiness since the dosing change.   Current HTN meds:  Clonidine 0.1mg  every evening Amlodipine 10mg  in the evening Chlorthalidone 25 mg every morning Benazepril 40mg  in the morning Furosemide 20mg  in the morning*dose reduced from 40mg  by Dr. Radford Pax due to bump in SCr  Previously tried:  Clonidine 0.3mg  twice daily - sleepiness and haziness He states he has not tried any other blood pressure medications that he can recall  BP goal: <150/90  Social History: Former smoker; quit about 30 years ago. Alcohol socially only. Drives a lot for work and reports that higher dose of clonidine made him very tired.  Diet: Eats about half his meals at home and half out. He does not add salt to his food. He drinks decaf coffee, but does drink 3-4 cans of diet soda per day.  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 He walks at least 1 hour a day.  Home BP readings:  Has uses multiple cuffs to measure blood pressure. He states he has one at home and keeps one in his truck since he is frequently on the go. He checks his blood pressure 4-5 times per day.  Readings over the last week have been mostly in the 140-150/70s. He had a 175/75 but states he did not take his medication that day. Low systolic reading in [id number].  Wt Readings from Last 3 Encounters:  04/07/15 155 lb (70.308 kg)  03/15/15 158 lb (71.668 kg)  02/07/15 155 lb (70.308 kg)   BP Readings from Last 3 Encounters:  04/22/15 134/86  04/15/15 148/90  04/07/15 152/82   Pulse Readings from Last 3 Encounters:  04/22/15 58  04/15/15 64  04/07/15 58    Renal function: CrCl cannot be calculated (Unknown ideal weight.).  Past Medical History  Diagnosis Date  . Adenomatous colon polyp 12/31/2005  . Diabetes mellitus   . Hypercholesterolemia   . Hypertension     Current Outpatient Prescriptions on File Prior to Visit  Medication Sig Dispense Refill  . amLODipine (NORVASC) 10 MG tablet Take 1 tablet (10 mg total) by mouth daily. 30 tablet 11  .  aspirin 81 MG tablet Take 81 mg by mouth daily.    . benazepril (LOTENSIN) 40 MG tablet TAKE 1 TABLET (40 MG TOTAL) BY MOUTH DAILY. 90 tablet 1  . chlorthalidone (HYGROTON) 25 MG tablet Take 1 tablet (25 mg total) by mouth daily. 30 tablet 11  . cloNIDine (CATAPRES) 0.1 MG tablet Take 1 tablet (0.1 mg total) by mouth daily. 60 tablet 11  . furosemide (LASIX) 20 MG tablet Take 1 tablet (20 mg total) by mouth daily. 30 tablet 11  . metFORMIN (GLUCOPHAGE) 500 MG tablet TAKE 1 TABLET EVERY MORNING AND 2 TABLET AT BEDTIME 270 tablet 1  . Omega-3 Fatty Acids (FISH OIL) 1000 MG CAPS Take 2,000 mg by mouth 2 (two) times daily.    . ONE TOUCH ULTRA TEST test strip USE TO CHECK BLOOD SUGAR ONCE A DAY AS INSTRUCTED 100 each 2  . vitamin E 400 UNIT capsule Take 400 Units by mouth daily.       No current facility-administered medications on file prior to visit.    No Known Allergies   Assessment/Plan:  1. Patient is at goal BP <150/32mmHg in office today and home readings have mostly been at goal. Will have pt continue on current BP regimen. Rechecking BMET today since he had a slight bump in SCr 2 weeks ago. He will continue to check his BP at home and call clinic if his readings are consistently elevated. Pt will follow up with Dr. Radford Pax as scheduled.   Megan E. Supple, PharmD, [facility] [id number] [place], [place] [address] Phone: [phone]; Fax: [phone] 05/05/2015 9:43 AM

## 2015-05-09 ENCOUNTER — Other Ambulatory Visit: Payer: Self-pay | Admitting: Pharmacist

## 2015-05-09 DIAGNOSIS — I10 Essential (primary) hypertension: Secondary | ICD-10-CM

## 2015-05-11 ENCOUNTER — Telehealth: Payer: Self-pay | Admitting: Cardiology

## 2015-05-11 NOTE — Telephone Encounter (Signed)
Notified of echo results.

## 2015-05-11 NOTE — Telephone Encounter (Signed)
New message ° ° ° ° ° °Returning a call to the nurse to get echo results °

## 2015-05-16 ENCOUNTER — Other Ambulatory Visit (INDEPENDENT_AMBULATORY_CARE_PROVIDER_SITE_OTHER): Payer: PPO | Admitting: *Deleted

## 2015-05-16 DIAGNOSIS — I10 Essential (primary) hypertension: Secondary | ICD-10-CM | POA: Diagnosis not present

## 2015-05-16 LAB — BASIC METABOLIC PANEL
BUN: 31 mg/dL — AB (ref 7–25)
CO2: 24 mmol/L (ref 20–31)
CREATININE: 1.28 mg/dL — AB (ref 0.70–1.25)
Calcium: 8.8 mg/dL (ref 8.6–10.3)
Chloride: 106 mmol/L (ref 98–110)
Glucose, Bld: 232 mg/dL — ABNORMAL HIGH (ref 65–99)
Potassium: 4.8 mmol/L (ref 3.5–5.3)
Sodium: 138 mmol/L (ref 135–146)

## 2015-07-12 ENCOUNTER — Ambulatory Visit (INDEPENDENT_AMBULATORY_CARE_PROVIDER_SITE_OTHER): Payer: PPO | Admitting: Cardiology

## 2015-07-12 ENCOUNTER — Encounter: Payer: Self-pay | Admitting: Cardiology

## 2015-07-12 VITALS — BP 120/64 | HR 59 | Ht 68.0 in | Wt 157.0 lb

## 2015-07-12 DIAGNOSIS — Z8249 Family history of ischemic heart disease and other diseases of the circulatory system: Secondary | ICD-10-CM | POA: Diagnosis not present

## 2015-07-12 DIAGNOSIS — I10 Essential (primary) hypertension: Secondary | ICD-10-CM

## 2015-07-12 DIAGNOSIS — E785 Hyperlipidemia, unspecified: Secondary | ICD-10-CM | POA: Diagnosis not present

## 2015-07-12 MED ORDER — BENAZEPRIL HCL 40 MG PO TABS: 40.0000 mg | ORAL_TABLET | Freq: Every day | ORAL | Status: DC

## 2015-07-12 MED ORDER — AMLODIPINE BESYLATE 10 MG PO TABS: 10.0000 mg | ORAL_TABLET | Freq: Every day | ORAL | Status: DC

## 2015-07-12 MED ORDER — CLONIDINE HCL 0.1 MG PO TABS
0.1000 mg | ORAL_TABLET | Freq: Every day | ORAL | Status: DC
Start: 2015-07-12 — End: 2015-12-27

## 2015-07-12 MED ORDER — CHLORTHALIDONE 25 MG PO TABS: 25.0000 mg | ORAL_TABLET | Freq: Every day | ORAL | Status: DC

## 2015-07-12 NOTE — Patient Instructions (Signed)

## 2015-07-12 NOTE — Progress Notes (Signed)
Cardiology Office Note    Date:  07/12/2015   ID:  Peter York, DOB [birth year], MRN [id number]  PCP:  Chevis Pretty, FNP  Cardiologist:  Sueanne Margarita, MD   Chief Complaint  Patient presents with  . Hypertension    History of Present Illness:  Peter York is a 70 y.o. male who presents for  followup of HTN. He has a history of HTN, DM and dyslipidemia. [name], Benazepril and Clonidine and tolerating well. He has denies any chest pain, SOB, DOE, LE edema, dizziness, palpitations or syncope.   He says that he stays active outside.      Past Medical History  Diagnosis Date  . Adenomatous colon polyp 12/31/2005  . Diabetes mellitus   . Hypercholesterolemia   . Hypertension     Past Surgical History  Procedure Laterality Date  . Colonoscopy w/ polypectomy  12/31/2005    Dr. Silvano Rusk  . Appendectomy    . Umbilical hernia repair    . Anal fistulectomy  10/02/2011    Procedure: FISTULECTOMY ANAL;  Surgeon: Joyice Faster. Cornett, MD;  Location: [facility];  Service: General;  Laterality: N/A;  excision perianal mass     Current Medications: Outpatient Prescriptions Prior to Visit  Medication Sig Dispense Refill  . amLODipine (NORVASC) 10 MG tablet Take 1 tablet (10 mg total) by mouth daily. 30 tablet 11  . aspirin 81 MG tablet Take 81 mg by mouth daily.    . benazepril (LOTENSIN) 40 MG tablet TAKE 1 TABLET (40 MG TOTAL) BY MOUTH DAILY. 90 tablet 1  . chlorthalidone (HYGROTON) 25 MG tablet Take 1 tablet (25 mg total) by mouth daily. 30 tablet 11  . cloNIDine (CATAPRES) 0.1 MG tablet Take 1 tablet (0.1 mg total) by mouth daily. 60 tablet 11  . metFORMIN (GLUCOPHAGE) 500 MG tablet TAKE 1 TABLET EVERY MORNING AND 2 TABLET AT BEDTIME 270 tablet 1  . Omega-3 Fatty Acids (FISH OIL) 1000 MG CAPS Take 2,000 mg by mouth 2 (two) times daily.    . ONE TOUCH ULTRA TEST test strip USE TO CHECK BLOOD SUGAR ONCE A DAY AS INSTRUCTED 100 each 2   . vitamin E 400 UNIT capsule Take 400 Units by mouth daily.     No facility-administered medications prior to visit.     Allergies:   Review of patient's allergies indicates no known allergies.   Social History   Social History  . Marital Status: Married    Spouse Name: N/A  . Number of Children: 3  . Years of Education: N/A   Occupational History  . retired    Social History Main Topics  . Smoking status: Former Smoker    Quit date: 03/26/1978  . Smokeless tobacco: Never Used  . Alcohol Use: Yes     Comment: socially  . Drug Use: No  . Sexual Activity: Not Asked   Other Topics Concern  . None   Social History Narrative     Family History:  The patient's family history includes Cancer in his father; Colon cancer (age of onset: 39) in his father; Colon polyps in his father; Diabetes in his daughter; Heart attack in his maternal uncle; Heart disease in his maternal uncle, maternal uncle, and maternal uncle.   ROS:   Please see the history of present illness.    Review of Systems  Constitution: Negative.  HENT: Negative.   Eyes: Negative.   Cardiovascular: Negative.   Respiratory:  Negative.   Skin: Negative.   Musculoskeletal: Negative.   Gastrointestinal: Negative.   Genitourinary: Negative.   Neurological: Negative.   Psychiatric/Behavioral: Negative.    All other systems reviewed and are negative.   PHYSICAL EXAM:   VS:  BP 120/64 mmHg  Pulse 59  Ht 5\' 8"  (1.727 m)  Wt 157 lb (71.215 kg)  BMI 23.88 kg/m2  SpO2 98%   GEN: Well nourished, well developed, in no acute distress HEENT: normal Neck: no JVD, carotid bruits, or masses Cardiac: RRR; no murmurs, rubs, or gallops,no edema.  Intact distal pulses bilaterally.  Respiratory:  clear to auscultation bilaterally, normal work of breathing GI: soft, nontender, nondistended, + BS MS: no deformity or atrophy Skin: warm and dry, no rash Neuro:  Alert and Oriented x 3, Strength and sensation are  intact Psych: euthymic mood, full affect  Wt Readings from Last 3 Encounters:  07/12/15 157 lb (71.215 kg)  04/07/15 155 lb (70.308 kg)  03/15/15 158 lb (71.668 kg)      Studies/Labs Reviewed:   EKG:  EKG is not ordered today.    Recent Labs: 01/29/2015: Platelets 301 03/15/2015: ALT 12 05/16/2015: BUN 31*; Creat 1.28*; Potassium 4.8; Sodium 138   Lipid Panel    Component Value Date/Time   CHOL 249* 03/15/2015 0820   CHOL 261* 03/09/2014 0818   CHOL 165 10/02/2012 0834   TRIG 104 03/15/2015 0820   TRIG 115 03/09/2014 0818   TRIG 95 10/02/2012 0834   HDL 49 03/15/2015 0820   HDL 43 03/09/2014 0818   HDL 51 10/02/2012 0834   CHOLHDL 5.1* 03/15/2015 0820   LDLCALC 179* 03/15/2015 0820   LDLCALC 223* 09/10/2013 0905   LDLCALC 95 10/02/2012 0834    Additional studies/ records that were reviewed today include:  none    ASSESSMENT:    1. Essential hypertension   2. Hyperlipidemia with target LDL less than 100   3. Family history of coronary arteriosclerosis      PLAN:  In order of problems listed above:  1. HTN - BP controlled on amlodipine, clorthalidone, clonidine and ARB.  BMET stable.  2. Dyslipidemia - he is due to see his PCP in a few months.  He stopped his statin due to muscle aches. I asked him to send me a copy of lipids when he gets them done with PCP 3.   Family history of CAD - no ischemia on nuclear stress test and echo with normal LVF.    Medication Adjustments/Labs and Tests Ordered: Current medicines are reviewed at length with the patient today.  Concerns regarding medicines are outlined above.  Medication changes, Labs and Tests ordered today are listed in the Patient Instructions below. There are no Patient Instructions on file for this visit.   Lurena Nida, MD  07/12/2015 8:14 AM    [facility] [address], [place], [place]  [address] Phone: [phone]; Fax: [phone]

## 2015-07-31 NOTE — Addendum Note (Signed)
Addended by: Fransico Him R on: 07/31/2015 09:30 PM   Modules accepted: Miquel Dunn

## 2015-09-15 ENCOUNTER — Other Ambulatory Visit: Payer: PPO

## 2015-09-15 DIAGNOSIS — E119 Type 2 diabetes mellitus without complications: Secondary | ICD-10-CM

## 2015-09-15 DIAGNOSIS — E785 Hyperlipidemia, unspecified: Secondary | ICD-10-CM | POA: Diagnosis not present

## 2015-09-15 DIAGNOSIS — E875 Hyperkalemia: Secondary | ICD-10-CM

## 2015-09-15 DIAGNOSIS — I10 Essential (primary) hypertension: Secondary | ICD-10-CM

## 2015-09-15 LAB — BAYER DCA HB A1C WAIVED: HB A1C (BAYER DCA - WAIVED): 6.3 % (ref <7.0)

## 2015-09-16 ENCOUNTER — Other Ambulatory Visit: Payer: PPO

## 2015-09-16 DIAGNOSIS — E875 Hyperkalemia: Secondary | ICD-10-CM | POA: Diagnosis not present

## 2015-09-16 LAB — CMP14+EGFR
A/G RATIO: 1.7 (ref 1.2–2.2)
ALT: 7 IU/L (ref 0–44)
AST: 12 IU/L (ref 0–40)
Albumin: 4.3 g/dL (ref 3.5–4.8)
Alkaline Phosphatase: 47 IU/L (ref 39–117)
BILIRUBIN TOTAL: 0.4 mg/dL (ref 0.0–1.2)
BUN / CREAT RATIO: 24 (ref 10–24)
BUN: 36 mg/dL — AB (ref 8–27)
CALCIUM: 9.2 mg/dL (ref 8.6–10.2)
CO2: 20 mmol/L (ref 18–29)
CREATININE: 1.48 mg/dL — AB (ref 0.76–1.27)
Chloride: 103 mmol/L (ref 96–106)
GFR calc non Af Amer: 47 mL/min/{1.73_m2} — ABNORMAL LOW (ref >59)
GFR, EST AFRICAN AMERICAN: 55 mL/min/{1.73_m2} — AB (ref >59)
GLOBULIN, TOTAL: 2.6 g/dL (ref 1.5–4.5)
Glucose: 125 mg/dL — ABNORMAL HIGH (ref 65–99)
Potassium: 5.4 mmol/L — ABNORMAL HIGH (ref 3.5–5.2)
Sodium: 139 mmol/L (ref 134–144)
Total Protein: 6.9 g/dL (ref 6.0–8.5)

## 2015-09-16 LAB — LIPID PANEL
Chol/HDL Ratio: 6.6 ratio units — ABNORMAL HIGH (ref 0.0–5.0)
Cholesterol, Total: 224 mg/dL — ABNORMAL HIGH (ref 100–199)
HDL: 34 mg/dL — ABNORMAL LOW (ref >39)
LDL CALC: 163 mg/dL — AB (ref 0–99)
Triglycerides: 136 mg/dL (ref 0–149)
VLDL CHOLESTEROL CAL: 27 mg/dL (ref 5–40)

## 2015-09-17 LAB — CMP14+EGFR
ALK PHOS: 51 IU/L (ref 39–117)
ALT: 7 IU/L (ref 0–44)
AST: 16 IU/L (ref 0–40)
Albumin/Globulin Ratio: 1.6 (ref 1.2–2.2)
Albumin: 4.4 g/dL (ref 3.5–4.8)
BUN / CREAT RATIO: 19 (ref 10–24)
BUN: 31 mg/dL — AB (ref 8–27)
Bilirubin Total: 0.4 mg/dL (ref 0.0–1.2)
CHLORIDE: 103 mmol/L (ref 96–106)
CO2: 22 mmol/L (ref 18–29)
CREATININE: 1.61 mg/dL — AB (ref 0.76–1.27)
Calcium: 9.1 mg/dL (ref 8.6–10.2)
GFR calc Af Amer: 49 mL/min/{1.73_m2} — ABNORMAL LOW (ref >59)
GFR calc non Af Amer: 43 mL/min/{1.73_m2} — ABNORMAL LOW (ref >59)
GLOBULIN, TOTAL: 2.8 g/dL (ref 1.5–4.5)
GLUCOSE: 106 mg/dL — AB (ref 65–99)
Potassium: 5.5 mmol/L — ABNORMAL HIGH (ref 3.5–5.2)
Sodium: 140 mmol/L (ref 134–144)
Total Protein: 7.2 g/dL (ref 6.0–8.5)

## 2015-09-19 ENCOUNTER — Ambulatory Visit (INDEPENDENT_AMBULATORY_CARE_PROVIDER_SITE_OTHER): Payer: PPO | Admitting: Nurse Practitioner

## 2015-09-19 ENCOUNTER — Encounter: Payer: Self-pay | Admitting: Nurse Practitioner

## 2015-09-19 VITALS — BP 135/76 | HR 54 | Temp 96.9°F | Ht 68.0 in | Wt 157.0 lb

## 2015-09-19 DIAGNOSIS — E875 Hyperkalemia: Secondary | ICD-10-CM | POA: Diagnosis not present

## 2015-09-19 DIAGNOSIS — E119 Type 2 diabetes mellitus without complications: Secondary | ICD-10-CM | POA: Diagnosis not present

## 2015-09-19 DIAGNOSIS — E785 Hyperlipidemia, unspecified: Secondary | ICD-10-CM

## 2015-09-19 DIAGNOSIS — I10 Essential (primary) hypertension: Secondary | ICD-10-CM

## 2015-09-19 MED ORDER — METFORMIN HCL 500 MG PO TABS: ORAL_TABLET | ORAL | Status: DC

## 2015-09-19 NOTE — Patient Instructions (Signed)
Hyperkalemia  Hyperkalemia is when you have too much potassium in your blood. Potassium is normally removed (excreted) from your body by your kidneys. If there is too much potassium in your blood, it can affect your heart's ability to function.   CAUSES   Hyperkalemia may be caused by:   · Taking in too much potassium. You can do this by:    Using salt substitutes. They contain large amounts of potassium.    Taking potassium supplements.    Eating foods high in potassium.  · Excreting too little potassium. This can happen if:    Your kidneys are not working properly. Kidney (renal) disease, including short- or long-term renal failure, is a very common cause of hyperkalemia.    You are taking medicines that lower your excretion of potassium.    You have Addison disease.    You have a urinary tract blockage, such as kidney stones.    You are on treatment to mechanically clean your blood (dialysis) and you skip a treatment.  · Releasing a high amount of potassium from your cells into your blood. This can happen with:    Injury to muscles (rhabdomyolysis) or other tissues. Most potassium is stored in your muscles.    Severe burns or infections.    Acidic blood plasma (acidosis). Acidosis can result from many diseases, such as uncontrolled diabetes.  RISK FACTORS  The most common risk factor of hyperkalemia is kidney disease. Other risk factors of hyperkalemia include:  · Addison disease. This is a condition where your glands do not produce enough hormones.  · Alcoholism or heavy drug use.    · Using certain blood pressure medicines, such as angiotensin-converting enzyme (ACE) inhibitors, angiotensin II receptor blockers (ARBs), or potassium-sparing diuretics such as spironolactone.  · Severe injury or burn.  SIGNS AND SYMPTOMS   Oftentimes, there are no signs or symptoms of hyperkalemia.  However, when your potassium level becomes high enough, you may experience symptoms such as:  · Irregular or very slow  heartbeat.  · Nausea.  · Fatigue.  · Tingling of the skin or numbness of the hands or feet.  · Muscle weakness.  · Fatigue.  · Not being able to move (paralysis).  You may not have any symptoms of hyperkalemia.   DIAGNOSIS   Hyperkalemia may be diagnosed by:  · Physical exam.  · Blood tests.  · ECG (electrocardiogram).  · Discussion of prescription and non-prescription drug use.  TREATMENT   Treatment for hyperkalemia is often directed at the underlying cause. In some instances, treatment may include:   · Insulin.  · Glucose (sugar) and water solution given through a vein (intravenous or IV ).  · Dialysis.  · Medicines to remove the potassium from your body.  · Medicines to move calcium from your bloodstream into your tissues.  HOME CARE INSTRUCTIONS   · Take medicines only as directed by your health care provider.  · Do not take any supplements, natural products, herbs, or vitamins without reviewing them with your health care provider. Certain supplements and natural food products can have high amounts of potassium.  · Limit your alcohol intake as directed by your health care provider.  · Stop illegal drug use. If you need help quitting, ask your health care provider.  · Keep all follow-up visits as directed by your health care provider. This is important.  · If you have kidney disease, you may need to follow a low potassium diet. A dietitian can help educate you on   low potassium foods.  SEEK MEDICAL CARE IF:   · You notice an irregular or very slow heartbeat.  · You feel light-headed.  · You feel weak.  · You are nauseous.  · You have tingling or numbness in your hands or feet.  SEEK IMMEDIATE MEDICAL CARE IF:   · You have shortness of breath.  · You have chest pain or discomfort.  · You pass out.  · You have muscle paralysis.  MAKE SURE YOU:   · Understand these instructions.  · Will watch your condition.  · Will get help right away if you are not doing well or get worse.     This information is not intended to  replace advice given to you by your health care provider. Make sure you discuss any questions you have with your health care provider.     Document Released: 03/02/2002 Document Revised: 04/02/2014 Document Reviewed: 06/17/2013  Elsevier Interactive Patient Education ©2016 [employer]

## 2015-09-19 NOTE — Progress Notes (Signed)
Subjective:    Patient ID: Peter York, male    DOB: [birth year], 70 y.o.   MRN: [id number]  Patient here today for follow up of chronic medical problems.  Outpatient Encounter Prescriptions as of 09/19/2015  Medication Sig  . amLODipine (NORVASC) 10 MG tablet Take 1 tablet (10 mg total) by mouth daily.  [name] aspirin 81 MG tablet Take 81 mg by mouth daily.  . benazepril (LOTENSIN) 40 MG tablet Take 1 tablet (40 mg total) by mouth daily.  . chlorthalidone (HYGROTON) 25 MG tablet Take 1 tablet (25 mg total) by mouth daily.  . cloNIDine (CATAPRES) 0.1 MG tablet Take 1 tablet (0.1 mg total) by mouth daily.  . metFORMIN (GLUCOPHAGE) 500 MG tablet TAKE 1 TABLET EVERY MORNING AND 2 TABLET AT BEDTIME  . Omega-3 Fatty Acids (FISH OIL) 1000 MG CAPS Take 2,000 mg by mouth 2 (two) times daily.  . ONE TOUCH ULTRA TEST test strip USE TO CHECK BLOOD SUGAR ONCE A DAY AS INSTRUCTED  . vitamin E 400 UNIT capsule Take 400 Units by mouth daily.   No facility-administered encounter medications on file as of 09/19/2015.      Hypertension This is a chronic problem. The current episode started more than 1 year ago. The problem has been waxing and waning since onset. The problem is controlled. Pertinent negatives include no chest pain, headaches, neck pain, palpitations or shortness of breath. Risk factors for coronary artery disease include dyslipidemia and male gender. Past treatments include calcium channel blockers, ACE inhibitors, central alpha agonists and diuretics. The current treatment provides significant improvement. There are no compliance problems.   Hyperlipidemia This is a chronic problem. The current episode started more than 1 year ago. The problem is controlled. Recent lipid tests were reviewed and are normal. Exacerbating diseases include diabetes. Factors aggravating his hyperlipidemia include smoking. Pertinent negatives include no chest pain or shortness of breath. He is currently on no  antihyperlipidemic treatment. The current treatment provides no improvement of lipids. There are no compliance problems.  Risk factors for coronary artery disease include diabetes mellitus and male sex.  Diabetes He presents for his follow-up diabetic visit. He has type 2 diabetes mellitus. No MedicAlert identification noted. His disease course has been stable. Pertinent negatives for hypoglycemia include no headaches. Pertinent negatives for diabetes include no chest pain, no polydipsia, no polyphagia, no polyuria and no visual change. There are no hypoglycemic complications. There are no diabetic complications. Risk factors for coronary artery disease include dyslipidemia, family history, hypertension and obesity. Current diabetic treatment includes oral agent (monotherapy). His weight is stable. He is following a diabetic diet. When asked about meal planning, he reported none. He has not had a previous visit with a dietitian. He participates in exercise daily. There is no change in his home blood glucose trend. His breakfast blood glucose is taken between 9-10 am. His breakfast blood glucose range is generally 130-140 mg/dl. His overall blood glucose range is 130-140 mg/dl. An ACE inhibitor/angiotensin II receptor blocker is being taken. He does not see a podiatrist.Eye exam is not current.      Review of Systems  Respiratory: Negative for shortness of breath.   Cardiovascular: Negative for chest pain and palpitations.  Endocrine: Negative for polydipsia, polyphagia and polyuria.  Musculoskeletal: Negative for neck pain.  Neurological: Negative for headaches.  All other systems reviewed and are negative.      Objective:   Physical Exam  Constitutional: He is oriented to person, place, and time.  He appears well-developed and well-nourished.  HENT:  Head: Normocephalic.  Right Ear: External ear normal.  Left Ear: External ear normal.  Nose: Nose normal.  Mouth/Throat: Oropharynx is clear  and moist.  Eyes: EOM are normal. Pupils are equal, round, and reactive to light.  Neck: Normal range of motion. Neck supple. No JVD present. No thyromegaly present.  Cardiovascular: Normal rate, regular rhythm, normal heart sounds and intact distal pulses.  Exam reveals no gallop and no friction rub.   No murmur heard. Pulmonary/Chest: Effort normal and breath sounds normal. No respiratory distress. He has no wheezes. He has no rales. He exhibits no tenderness.  Abdominal: Soft. Bowel sounds are normal. He exhibits no mass. There is no tenderness.  Genitourinary: Prostate normal and penis normal.  Musculoskeletal: Normal range of motion. He exhibits no edema.  Lymphadenopathy:    He has no cervical adenopathy.  Neurological: He is alert and oriented to person, place, and time. No cranial nerve deficit.  Skin: Skin is warm and dry.  Psychiatric: He has a normal mood and affect. His behavior is normal. Judgment and thought content normal.    BP 135/76 mmHg  Pulse 54  Temp(Src) 96.9 F (36.1 C) (Oral)  Ht 5\' 8"  (1.727 m)  Wt 157 lb (71.215 kg)  BMI 23.88 kg/m2  hgba1c 6.3%     Assessment & Plan:   1. Hyperlipidemia with target LDL less than 100 Low fat diet  2. Essential hypertension Do not add salt to diet  3. Type 2 diabetes mellitus without complication, without long-term current use of insulin (HCC) Continue  To watch carbs in diet - metFORMIN (GLUCOPHAGE) 500 MG tablet; TAKE 1 TABLET EVERY MORNING AND 2 TABLET AT BEDTIME  Dispense: 270 tablet; Refill: 1  4. Hyperkalemia Decrease consumption on bananas in diet    Labs pending Health maintenance reviewed Diet and exercise encouraged Continue all meds Follow up  In 3 months   Anna, FNP

## 2015-12-13 DIAGNOSIS — H40033 Anatomical narrow angle, bilateral: Secondary | ICD-10-CM | POA: Diagnosis not present

## 2015-12-13 DIAGNOSIS — E119 Type 2 diabetes mellitus without complications: Secondary | ICD-10-CM | POA: Diagnosis not present

## 2015-12-13 LAB — HM DIABETES EYE EXAM

## 2015-12-26 ENCOUNTER — Ambulatory Visit: Payer: PPO | Admitting: Nurse Practitioner

## 2015-12-27 ENCOUNTER — Encounter: Payer: Self-pay | Admitting: Nurse Practitioner

## 2015-12-27 ENCOUNTER — Ambulatory Visit (INDEPENDENT_AMBULATORY_CARE_PROVIDER_SITE_OTHER): Payer: PPO | Admitting: Nurse Practitioner

## 2015-12-27 VITALS — BP 128/78 | HR 58 | Temp 97.4°F | Ht 68.0 in | Wt 158.0 lb

## 2015-12-27 DIAGNOSIS — E119 Type 2 diabetes mellitus without complications: Secondary | ICD-10-CM | POA: Diagnosis not present

## 2015-12-27 DIAGNOSIS — I10 Essential (primary) hypertension: Secondary | ICD-10-CM | POA: Diagnosis not present

## 2015-12-27 DIAGNOSIS — E785 Hyperlipidemia, unspecified: Secondary | ICD-10-CM | POA: Diagnosis not present

## 2015-12-27 LAB — LIPID PANEL
CHOL/HDL RATIO: 7.1 ratio — AB (ref 0.0–5.0)
Cholesterol, Total: 242 mg/dL — ABNORMAL HIGH (ref 100–199)
HDL: 34 mg/dL — ABNORMAL LOW (ref >39)
LDL CALC: 179 mg/dL — AB (ref 0–99)
Triglycerides: 146 mg/dL (ref 0–149)
VLDL Cholesterol Cal: 29 mg/dL (ref 5–40)

## 2015-12-27 LAB — CMP14+EGFR
A/G RATIO: 1.8 (ref 1.2–2.2)
ALT: 7 IU/L (ref 0–44)
AST: 14 IU/L (ref 0–40)
Albumin: 4.4 g/dL (ref 3.5–4.8)
Alkaline Phosphatase: 49 IU/L (ref 39–117)
BUN/Creatinine Ratio: 17 (ref 10–24)
BUN: 30 mg/dL — ABNORMAL HIGH (ref 8–27)
Bilirubin Total: 0.3 mg/dL (ref 0.0–1.2)
CALCIUM: 8.8 mg/dL (ref 8.6–10.2)
CO2: 22 mmol/L (ref 18–29)
Chloride: 104 mmol/L (ref 96–106)
Creatinine, Ser: 1.76 mg/dL — ABNORMAL HIGH (ref 0.76–1.27)
GFR, EST AFRICAN AMERICAN: 44 mL/min/{1.73_m2} — AB (ref >59)
GFR, EST NON AFRICAN AMERICAN: 38 mL/min/{1.73_m2} — AB (ref >59)
GLOBULIN, TOTAL: 2.4 g/dL (ref 1.5–4.5)
Glucose: 83 mg/dL (ref 65–99)
POTASSIUM: 5 mmol/L (ref 3.5–5.2)
Sodium: 141 mmol/L (ref 134–144)
TOTAL PROTEIN: 6.8 g/dL (ref 6.0–8.5)

## 2015-12-27 LAB — BAYER DCA HB A1C WAIVED: HB A1C: 5.7 % (ref <7.0)

## 2015-12-27 MED ORDER — CHLORTHALIDONE 25 MG PO TABS: 25.0000 mg | ORAL_TABLET | Freq: Every day | ORAL | 3 refills | Status: DC

## 2015-12-27 MED ORDER — BENAZEPRIL HCL 40 MG PO TABS: 40.0000 mg | ORAL_TABLET | Freq: Every day | ORAL | 3 refills | Status: DC

## 2015-12-27 MED ORDER — VITAMIN E 180 MG (400 UNIT) PO CAPS: 400.0000 [IU] | ORAL_CAPSULE | Freq: Every day | ORAL | 3 refills | Status: DC

## 2015-12-27 MED ORDER — CLONIDINE HCL 0.1 MG PO TABS: 0.1000 mg | ORAL_TABLET | Freq: Every day | ORAL | 3 refills | Status: DC

## 2015-12-27 MED ORDER — AMLODIPINE BESYLATE 10 MG PO TABS
10.0000 mg | ORAL_TABLET | Freq: Every day | ORAL | 3 refills | Status: DC
Start: 2015-12-27 — End: 2016-11-19

## 2015-12-27 MED ORDER — METFORMIN HCL 500 MG PO TABS: ORAL_TABLET | ORAL | 1 refills | Status: DC

## 2015-12-27 NOTE — Patient Instructions (Signed)

## 2015-12-27 NOTE — Progress Notes (Signed)
Subjective:    Patient ID: Peter York, male    DOB: 10-Sep-1945, 70 y.o.   MRN: [id number]  Patient here today for follow up of chronic medical problems. No changes since last visit. No complaints today.  Outpatient Encounter Prescriptions as of 12/27/2015  Medication Sig  . amLODipine (NORVASC) 10 MG tablet Take 1 tablet (10 mg total) by mouth daily.  [name] aspirin 81 MG tablet Take 81 mg by mouth daily.  . benazepril (LOTENSIN) 40 MG tablet Take 1 tablet (40 mg total) by mouth daily.  . chlorthalidone (HYGROTON) 25 MG tablet Take 1 tablet (25 mg total) by mouth daily.  . cloNIDine (CATAPRES) 0.1 MG tablet Take 1 tablet (0.1 mg total) by mouth daily.  . metFORMIN (GLUCOPHAGE) 500 MG tablet TAKE 1 TABLET EVERY MORNING AND 2 TABLET AT BEDTIME  . Omega-3 Fatty Acids (FISH OIL) 1000 MG CAPS Take 2,000 mg by mouth 2 (two) times daily.  . ONE TOUCH ULTRA TEST test strip USE TO CHECK BLOOD SUGAR ONCE A DAY AS INSTRUCTED  . vitamin E 400 UNIT capsule Take 400 Units by mouth daily.   No facility-administered encounter medications on file as of 12/27/2015.       Hypertension  This is a chronic problem. The current episode started more than 1 year ago. The problem has been waxing and waning since onset. The problem is controlled. Pertinent negatives include no chest pain, headaches, neck pain, palpitations or shortness of breath. Risk factors for coronary artery disease include dyslipidemia and male gender. Past treatments include calcium channel blockers, ACE inhibitors, central alpha agonists and diuretics. The current treatment provides significant improvement. There are no compliance problems.   Hyperlipidemia  This is a chronic problem. The current episode started more than 1 year ago. The problem is controlled. Recent lipid tests were reviewed and are normal. Exacerbating diseases include diabetes. Factors aggravating his hyperlipidemia include smoking. Pertinent negatives include no chest pain or  shortness of breath. He is currently on no antihyperlipidemic treatment. The current treatment provides no improvement of lipids. There are no compliance problems.  Risk factors for coronary artery disease include diabetes mellitus and male sex.  Diabetes  He presents for his follow-up diabetic visit. He has type 2 diabetes mellitus. No MedicAlert identification noted. His disease course has been stable. Pertinent negatives for hypoglycemia include no headaches. Pertinent negatives for diabetes include no chest pain, no polydipsia, no polyphagia, no polyuria and no visual change. There are no hypoglycemic complications. There are no diabetic complications. Risk factors for coronary artery disease include dyslipidemia, family history, hypertension and obesity. Current diabetic treatment includes oral agent (monotherapy). His weight is stable. He is following a diabetic diet. When asked about meal planning, he reported none. He has not had a previous visit with a dietitian. He participates in exercise daily. There is no change in his home blood glucose trend. His breakfast blood glucose is taken between 9-10 am. His breakfast blood glucose range is generally 130-140 mg/dl. His overall blood glucose range is 130-140 mg/dl. An ACE inhibitor/angiotensin II receptor blocker is being taken. He does not see a podiatrist.Eye exam is not current.      Review of Systems  Constitutional: Negative.   Respiratory: Negative for shortness of breath.   Cardiovascular: Negative for chest pain, palpitations and leg swelling.  Gastrointestinal: Negative for diarrhea.  Endocrine: Negative for polydipsia, polyphagia and polyuria.  Musculoskeletal: Negative for neck pain.  Neurological: Negative for headaches.  All other systems reviewed  and are negative.      Objective:   Physical Exam  Constitutional: He is oriented to person, place, and time. He appears well-developed and well-nourished.  HENT:  Head:  Normocephalic.  Right Ear: External ear normal.  Left Ear: External ear normal.  Nose: Nose normal.  Mouth/Throat: Oropharynx is clear and moist.  Eyes: EOM are normal. Pupils are equal, round, and reactive to light.  Neck: Normal range of motion. Neck supple. No JVD present. No thyromegaly present.  Cardiovascular: Normal rate, regular rhythm, normal heart sounds and intact distal pulses.  Exam reveals no gallop and no friction rub.   No murmur heard. Pulmonary/Chest: Effort normal and breath sounds normal. No respiratory distress. He has no wheezes. He has no rales. He exhibits no tenderness.  Abdominal: Soft. Bowel sounds are normal. He exhibits no mass. There is no tenderness.  Genitourinary: Prostate normal and penis normal.  Musculoskeletal: Normal range of motion. He exhibits no edema.  Lymphadenopathy:    He has no cervical adenopathy.  Neurological: He is alert and oriented to person, place, and time. No cranial nerve deficit.  Skin: Skin is warm and dry.  Psychiatric: He has a normal mood and affect. His behavior is normal. Judgment and thought content normal.    BP 128/78   Pulse (!) 58   Temp 97.4 F (36.3 C) (Oral)   Ht 5' 8"  (1.727 m)   Wt 158 lb (71.7 kg)   BMI 24.02 kg/m   Hgb A1c 5.7% down from 6.3% three months ago    Assessment & Plan:  1. Hyperlipidemia with target LDL less than 100 Low fat diet - Lipid panel  2. Essential hypertension Low salt diet, do not add extra salt to food - CMP14+EGFR - amLODipine (NORVASC) 10 MG tablet; Take 1 tablet (10 mg total) by mouth daily.  Dispense: 90 tablet; Refill: 3 - benazepril (LOTENSIN) 40 MG tablet; Take 1 tablet (40 mg total) by mouth daily.  Dispense: 90 tablet; Refill: 3 - chlorthalidone (HYGROTON) 25 MG tablet; Take 1 tablet (25 mg total) by mouth daily.  Dispense: 90 tablet; Refill: 3 - cloNIDine (CATAPRES) 0.1 MG tablet; Take 1 tablet (0.1 mg total) by mouth daily.  Dispense: 90 tablet; Refill: 3  3. Type  2 diabetes mellitus without complication, without long-term current use of insulin (HCC) Low carb diet - Bayer DCA Hb A1c Waived - metFORMIN (GLUCOPHAGE) 500 MG tablet; TAKE 1 TABLET EVERY MORNING AND 2 TABLET AT BEDTIME  Dispense: 270 tablet; Refill: 1    Labs pending Health maintenance reviewed Diet and exercise encouraged Continue all meds Follow up  in 3 months  Jari Favre, RN, NP [place], FNP

## 2016-01-11 ENCOUNTER — Other Ambulatory Visit: Payer: Self-pay | Admitting: Nurse Practitioner

## 2016-01-23 ENCOUNTER — Ambulatory Visit (INDEPENDENT_AMBULATORY_CARE_PROVIDER_SITE_OTHER): Payer: PPO

## 2016-01-23 ENCOUNTER — Ambulatory Visit (INDEPENDENT_AMBULATORY_CARE_PROVIDER_SITE_OTHER): Payer: PPO | Admitting: Nurse Practitioner

## 2016-01-23 ENCOUNTER — Encounter: Payer: Self-pay | Admitting: Nurse Practitioner

## 2016-01-23 VITALS — BP 138/80 | HR 65 | Temp 97.1°F | Ht 68.0 in | Wt 158.0 lb

## 2016-01-23 DIAGNOSIS — M549 Dorsalgia, unspecified: Secondary | ICD-10-CM

## 2016-01-23 MED ORDER — METHYLPREDNISOLONE ACETATE 80 MG/ML IJ SUSP
80.0000 mg | Freq: Once | INTRAMUSCULAR | Status: AC
  Administered 2016-01-23: 80 mg via INTRAMUSCULAR

## 2016-01-23 MED ORDER — CYCLOBENZAPRINE HCL 10 MG PO TABS: 10.0000 mg | ORAL_TABLET | Freq: Three times a day (TID) | ORAL | 1 refills | Status: DC | PRN

## 2016-01-23 NOTE — Patient Instructions (Signed)
Thoracic Strain A thoracic strain, which is sometimes called a mid-back strain, is an injury to the muscles or tendons that attach to the upper part of your back behind your chest. This type of injury occurs when a muscle is overstretched or overloaded.  Thoracic strains can range from mild to severe. Mild strains may involve stretching a muscle or tendon without tearing it. These injuries may heal in 1-2 weeks. More severe strains involve tearing of muscle fibers or tendons. These will cause more pain and may take 6-8 weeks to heal. CAUSES This condition may be caused by:  An injury in which a sudden force is placed on the muscle.  Exercising without properly warming up.  Overuse of the muscle.  Improper form during certain movements.  Other injuries that surround or cause stress on the mid-back, causing a strain on the muscles. In some cases, the cause may not be known. RISK FACTORS This injury is more common in:  Athletes.  People with obesity. SYMPTOMS The main symptom of this condition is pain, especially with movement. Other symptoms include:  Bruising.  Swelling.  Spasm. DIAGNOSIS This condition may be diagnosed with a physical exam. X-rays may be taken to check for a fracture. TREATMENT This condition may be treated with:  Resting and icing the injured area.  Physical therapy. This will involve doing stretching and strengthening exercises.  Medicines for pain and inflammation. HOME CARE INSTRUCTIONS  Rest as needed. Follow instructions from your health care provider about any restrictions on activity.  If directed, apply ice to the injured area:  Put ice in a plastic bag.  Place a towel between your skin and the bag.  Leave the ice on for 20 minutes, 2-3 times per day.  Take over-the-counter and prescription medicines only as told by your health care provider.  Begin doing exercises as told by your health care provider or physical therapist.  Always  warm up properly before physical activity or sports.  Bend your knees before you lift heavy objects.  Keep all follow-up visits as told by your health care provider. This is important. SEEK MEDICAL CARE IF:  Your pain is not helped by medicine.  Your pain, bruising, or swelling is getting worse.  You have a fever. SEEK IMMEDIATE MEDICAL CARE IF:  You have shortness of breath.  You have chest pain.  You develop numbness or weakness in your legs.  You have involuntary loss of urine (urinary incontinence).   This information is not intended to replace advice given to you by your health care provider. Make sure you discuss any questions you have with your health care provider.   Document Released: 06/02/2003 Document Revised: 12/01/2014 Document Reviewed: 05/06/2014 Elsevier Interactive Patient Education 2016 [employer]  

## 2016-01-23 NOTE — Progress Notes (Signed)
   Subjective:    Patient ID: Peter York, male    DOB: 08-Dec-1945, 70 y.o.   MRN: [id number]  HPI Patient in today c/o back pain. He has been riding his horse a lot whie training his hunting dogs. Back started hurting Tuesday and has gradually gotten worse. Rates pain 10/10- pain is intermittent- movement in certain directions causes a "catch of Pain". Sitting still helps relieve pain.    Review of Systems  Constitutional: Negative.   HENT: Negative.   Respiratory: Negative.   Cardiovascular: Negative.   Musculoskeletal: Positive for back pain.  Neurological: Negative.   Psychiatric/Behavioral: Negative.   All other systems reviewed and are negative.      Objective:   Physical Exam  Constitutional: He is oriented to person, place, and time. He appears well-developed and well-nourished.  Cardiovascular: Normal rate, regular rhythm and normal heart sounds.   Pulmonary/Chest: Effort normal.  Musculoskeletal:  Decrease ROM of thoracic spine with pain on rotation. Pain radiate around right side of chest. Grips equal bil Motor strength and sensation distally intact bil upper ext.  Neurological: He is alert and oriented to person, place, and time.  Skin: Skin is warm.  Psychiatric: He has a normal mood and affect. His behavior is normal. Judgment and thought content normal.    BP 138/80 (BP Location: Left Arm, Cuff Size: Normal)   Pulse 65   Temp 97.1 F (36.2 C) (Oral)   Ht 5\' 8"  (1.727 m)   Wt 158 lb (71.7 kg)   BMI 24.02 kg/m   Thoracic spine xray- no acute findings-Preliminary reading by Ronnald Collum, FNP  [facility]      Assessment & Plan:  1. Mid back pain No horse riding for at least 1 week Moist heat to back  rest  No heavy lifting Follow up prn - DG Thoracic Spine 2 View - cyclobenzaprine (FLEXERIL) 10 MG tablet; Take 1 tablet (10 mg total) by mouth 3 (three) times daily as needed for muscle spasms.  Dispense: 30 tablet; Refill: 1 - methylPREDNISolone acetate  (DEPO-MEDROL) injection 80 mg; Inject 1 mL (80 mg total) into the muscle once.  Mary-Margaret Hassell Done, FNP

## 2016-02-11 ENCOUNTER — Emergency Department (HOSPITAL_COMMUNITY): Payer: PPO

## 2016-02-11 ENCOUNTER — Inpatient Hospital Stay (HOSPITAL_COMMUNITY)
Admission: EM | Admit: 2016-02-11 | Discharge: 2016-02-12 | DRG: 683 | Disposition: A | Discharge status: Home / Self Care | Payer: PPO | Attending: Internal Medicine | Admitting: Internal Medicine

## 2016-02-11 ENCOUNTER — Encounter (HOSPITAL_COMMUNITY): Payer: Self-pay | Admitting: Emergency Medicine

## 2016-02-11 DIAGNOSIS — Z800 Family history of malignant neoplasm of digestive organs: Secondary | ICD-10-CM | POA: Diagnosis not present

## 2016-02-11 DIAGNOSIS — I129 Hypertensive chronic kidney disease with stage 1 through stage 4 chronic kidney disease, or unspecified chronic kidney disease: Secondary | ICD-10-CM | POA: Diagnosis not present

## 2016-02-11 DIAGNOSIS — N183 Chronic kidney disease, stage 3 (moderate): Secondary | ICD-10-CM | POA: Diagnosis not present

## 2016-02-11 DIAGNOSIS — E871 Hypo-osmolality and hyponatremia: Secondary | ICD-10-CM

## 2016-02-11 DIAGNOSIS — E7800 Pure hypercholesterolemia, unspecified: Secondary | ICD-10-CM | POA: Diagnosis not present

## 2016-02-11 DIAGNOSIS — E1169 Type 2 diabetes mellitus with other specified complication: Secondary | ICD-10-CM

## 2016-02-11 DIAGNOSIS — N179 Acute kidney failure, unspecified: Secondary | ICD-10-CM | POA: Diagnosis not present

## 2016-02-11 DIAGNOSIS — E875 Hyperkalemia: Secondary | ICD-10-CM | POA: Diagnosis not present

## 2016-02-11 DIAGNOSIS — E119 Type 2 diabetes mellitus without complications: Secondary | ICD-10-CM | POA: Diagnosis present

## 2016-02-11 DIAGNOSIS — Z86010 Personal history of colonic polyps: Secondary | ICD-10-CM | POA: Diagnosis not present

## 2016-02-11 DIAGNOSIS — Z7984 Long term (current) use of oral hypoglycemic drugs: Secondary | ICD-10-CM

## 2016-02-11 DIAGNOSIS — R6881 Early satiety: Secondary | ICD-10-CM | POA: Diagnosis present

## 2016-02-11 DIAGNOSIS — Z79899 Other long term (current) drug therapy: Secondary | ICD-10-CM | POA: Diagnosis not present

## 2016-02-11 DIAGNOSIS — Z87891 Personal history of nicotine dependence: Secondary | ICD-10-CM

## 2016-02-11 DIAGNOSIS — E860 Dehydration: Secondary | ICD-10-CM | POA: Diagnosis present

## 2016-02-11 DIAGNOSIS — I10 Essential (primary) hypertension: Secondary | ICD-10-CM | POA: Diagnosis present

## 2016-02-11 DIAGNOSIS — K5730 Diverticulosis of large intestine without perforation or abscess without bleeding: Secondary | ICD-10-CM | POA: Diagnosis not present

## 2016-02-11 DIAGNOSIS — R634 Abnormal weight loss: Secondary | ICD-10-CM | POA: Diagnosis not present

## 2016-02-11 DIAGNOSIS — M4854XA Collapsed vertebra, not elsewhere classified, thoracic region, initial encounter for fracture: Secondary | ICD-10-CM | POA: Diagnosis present

## 2016-02-11 DIAGNOSIS — Z7982 Long term (current) use of aspirin: Secondary | ICD-10-CM | POA: Diagnosis not present

## 2016-02-11 DIAGNOSIS — T39395A Adverse effect of other nonsteroidal anti-inflammatory drugs [NSAID], initial encounter: Secondary | ICD-10-CM | POA: Diagnosis not present

## 2016-02-11 DIAGNOSIS — R000 Tachycardia, unspecified: Secondary | ICD-10-CM | POA: Diagnosis present

## 2016-02-11 DIAGNOSIS — Z8249 Family history of ischemic heart disease and other diseases of the circulatory system: Secondary | ICD-10-CM | POA: Diagnosis not present

## 2016-02-11 DIAGNOSIS — R5383 Other fatigue: Secondary | ICD-10-CM | POA: Diagnosis not present

## 2016-02-11 DIAGNOSIS — R630 Anorexia: Secondary | ICD-10-CM

## 2016-02-11 DIAGNOSIS — Z833 Family history of diabetes mellitus: Secondary | ICD-10-CM | POA: Diagnosis not present

## 2016-02-11 DIAGNOSIS — R079 Chest pain, unspecified: Secondary | ICD-10-CM | POA: Diagnosis not present

## 2016-02-11 DIAGNOSIS — E872 Acidosis: Secondary | ICD-10-CM | POA: Diagnosis present

## 2016-02-11 DIAGNOSIS — E861 Hypovolemia: Secondary | ICD-10-CM | POA: Diagnosis not present

## 2016-02-11 LAB — CBC WITH DIFFERENTIAL/PLATELET
BASOS ABS: 0 10*3/uL (ref 0.0–0.1)
Basophils Relative: 0 %
Eosinophils Absolute: 0.2 10*3/uL (ref 0.0–0.7)
Eosinophils Relative: 3 %
HEMATOCRIT: 33.6 % — AB (ref 39.0–52.0)
Hemoglobin: 12 g/dL — ABNORMAL LOW (ref 13.0–17.0)
LYMPHS PCT: 9 %
Lymphs Abs: 0.6 10*3/uL — ABNORMAL LOW (ref 0.7–4.0)
MCH: 31.8 pg (ref 26.0–34.0)
MCHC: 35.7 g/dL (ref 30.0–36.0)
MCV: 89.1 fL (ref 78.0–100.0)
MONO ABS: 0.3 10*3/uL (ref 0.1–1.0)
Monocytes Relative: 5 %
NEUTROS ABS: 5.2 10*3/uL (ref 1.7–7.7)
Neutrophils Relative %: 83 %
Platelets: 250 10*3/uL (ref 150–400)
RBC: 3.77 MIL/uL — ABNORMAL LOW (ref 4.22–5.81)
RDW: 12.4 % (ref 11.5–15.5)
WBC: 6.3 10*3/uL (ref 4.0–10.5)

## 2016-02-11 LAB — GLUCOSE, CAPILLARY: GLUCOSE-CAPILLARY: 134 mg/dL — AB (ref 65–99)

## 2016-02-11 LAB — COMPREHENSIVE METABOLIC PANEL
ALT: 11 U/L — AB (ref 17–63)
AST: 14 U/L — AB (ref 15–41)
Albumin: 4 g/dL (ref 3.5–5.0)
Alkaline Phosphatase: 60 U/L (ref 38–126)
Anion gap: 8 (ref 5–15)
BILIRUBIN TOTAL: 0.6 mg/dL (ref 0.3–1.2)
BUN: 68 mg/dL — AB (ref 6–20)
CALCIUM: 10.2 mg/dL (ref 8.9–10.3)
CO2: 19 mmol/L — ABNORMAL LOW (ref 22–32)
CREATININE: 2.6 mg/dL — AB (ref 0.61–1.24)
Chloride: 102 mmol/L (ref 101–111)
GFR calc Af Amer: 27 mL/min — ABNORMAL LOW (ref >60)
GFR, EST NON AFRICAN AMERICAN: 23 mL/min — AB (ref >60)
Glucose, Bld: 196 mg/dL — ABNORMAL HIGH (ref 65–99)
Potassium: 5.8 mmol/L — ABNORMAL HIGH (ref 3.5–5.1)
Sodium: 129 mmol/L — ABNORMAL LOW (ref 135–145)
TOTAL PROTEIN: 8 g/dL (ref 6.5–8.1)

## 2016-02-11 LAB — URINALYSIS, ROUTINE W REFLEX MICROSCOPIC
Bilirubin Urine: NEGATIVE
GLUCOSE, UA: NEGATIVE mg/dL
KETONES UR: NEGATIVE mg/dL
Leukocytes, UA: NEGATIVE
Nitrite: NEGATIVE
PROTEIN: NEGATIVE mg/dL
Specific Gravity, Urine: 1.025 (ref 1.005–1.030)
pH: 5.5 (ref 5.0–8.0)

## 2016-02-11 LAB — URINE MICROSCOPIC-ADD ON

## 2016-02-11 LAB — TROPONIN I: Troponin I: <0.03 ng/mL (ref <0.03)

## 2016-02-11 LAB — LIPASE, BLOOD: LIPASE: 209 U/L — AB (ref 11–51)

## 2016-02-11 MED ORDER — ASPIRIN 81 MG PO TABS
81.0000 mg | ORAL_TABLET | Freq: Every day | ORAL | Status: DC
  Administered 2016-02-11: 81 mg via ORAL
  Filled 2016-02-11 (×3): qty 1

## 2016-02-11 MED ORDER — SODIUM CHLORIDE 0.9 % IV SOLN
INTRAVENOUS | Status: DC
  Administered 2016-02-11: 21:00:00 via INTRAVENOUS
  Administered 2016-02-12: 150 mL/h via INTRAVENOUS

## 2016-02-11 MED ORDER — ENSURE ENLIVE PO LIQD: 237.0000 mL | Freq: Two times a day (BID) | ORAL | Status: DC

## 2016-02-11 MED ORDER — ACETAMINOPHEN 325 MG PO TABS: 650.0000 mg | ORAL_TABLET | Freq: Four times a day (QID) | ORAL | Status: DC | PRN

## 2016-02-11 MED ORDER — HEPARIN SODIUM (PORCINE) 5000 UNIT/ML IJ SOLN
5000.0000 [IU] | Freq: Three times a day (TID) | INTRAMUSCULAR | Status: DC
  Administered 2016-02-11 – 2016-02-12 (×2): 5000 [IU] via SUBCUTANEOUS
  Filled 2016-02-11 (×2): qty 1

## 2016-02-11 MED ORDER — AMLODIPINE BESYLATE 5 MG PO TABS
10.0000 mg | ORAL_TABLET | Freq: Every day | ORAL | Status: DC
  Administered 2016-02-11: 10 mg via ORAL
  Filled 2016-02-11 (×2): qty 2

## 2016-02-11 MED ORDER — SODIUM POLYSTYRENE SULFONATE 15 GM/60ML PO SUSP
15.0000 g | Freq: Once | ORAL | Status: AC
  Administered 2016-02-11: 15 g via ORAL
  Filled 2016-02-11: qty 60

## 2016-02-11 MED ORDER — CLONIDINE HCL 0.1 MG PO TABS
0.1000 mg | ORAL_TABLET | Freq: Two times a day (BID) | ORAL | Status: DC
  Administered 2016-02-11: 0.1 mg via ORAL
  Filled 2016-02-11 (×2): qty 1

## 2016-02-11 MED ORDER — VITAMIN E 180 MG (400 UNIT) PO CAPS
400.0000 [IU] | ORAL_CAPSULE | Freq: Every day | ORAL | Status: DC
  Filled 2016-02-11 (×3): qty 1

## 2016-02-11 MED ORDER — SODIUM BICARBONATE 8.4 % IV SOLN
50.0000 meq | Freq: Once | INTRAVENOUS | Status: AC
  Administered 2016-02-11: 50 meq via INTRAVENOUS
  Filled 2016-02-11: qty 50

## 2016-02-11 MED ORDER — INSULIN ASPART 100 UNIT/ML ~~LOC~~ SOLN
5.0000 [IU] | Freq: Once | SUBCUTANEOUS | Status: AC
  Administered 2016-02-11: 5 [IU] via INTRAVENOUS
  Filled 2016-02-11: qty 1

## 2016-02-11 MED ORDER — INSULIN ASPART 100 UNIT/ML ~~LOC~~ SOLN: 0.0000 [IU] | Freq: Three times a day (TID) | SUBCUTANEOUS | Status: DC

## 2016-02-11 MED ORDER — ACETAMINOPHEN 650 MG RE SUPP: 650.0000 mg | Freq: Four times a day (QID) | RECTAL | Status: DC | PRN

## 2016-02-11 MED ORDER — IOPAMIDOL (ISOVUE-300) INJECTION 61%
30.0000 mL | Freq: Once | INTRAVENOUS | Status: AC | PRN
Start: 2016-02-11 — End: 2016-02-11
  Administered 2016-02-11: 30 mL via ORAL

## 2016-02-11 MED ORDER — ONDANSETRON HCL 4 MG/2ML IJ SOLN: 4.0000 mg | Freq: Three times a day (TID) | INTRAMUSCULAR | Status: DC | PRN

## 2016-02-11 MED ORDER — SODIUM CHLORIDE 0.9 % IV BOLUS (SEPSIS)
1000.0000 mL | Freq: Once | INTRAVENOUS | Status: AC
  Administered 2016-02-11: 1000 mL via INTRAVENOUS

## 2016-02-11 MED ORDER — OMEGA-3-ACID ETHYL ESTERS 1 G PO CAPS
1.0000 g | ORAL_CAPSULE | Freq: Two times a day (BID) | ORAL | Status: DC
  Administered 2016-02-11 – 2016-02-12 (×2): 1 g via ORAL
  Filled 2016-02-11 (×2): qty 1

## 2016-02-11 NOTE — ED Provider Notes (Addendum)
Medical screening examination/treatment/procedure(s) were conducted as a shared visit with non-physician practitioner(s) and myself.  I personally evaluated the patient during the encounter.   EKG Interpretation  Date/Time:  Saturday February 11 2016 10:37:40 EST Ventricular Rate:  75 PR Interval:    QRS Duration: 90 QT Interval:  341 QTC Calculation: 381 R Axis:   15 Text Interpretation:  Sinus rhythm Abnormal R-wave progression, early transition Borderline T abnormalities, inferior leads Hyperkalemia Confirmed by Rogene Houston  MD, Zhion Pevehouse [phone]) on 02/11/2016 12:46:50 PM      Results for orders placed or performed during the hospital encounter of 02/11/16  CBC with Differential  Result Value Ref Range   WBC 6.3 4.0 - 10.5 K/uL   RBC 3.77 (L) 4.22 - 5.81 MIL/uL   Hemoglobin 12.0 (L) 13.0 - 17.0 g/dL   HCT 33.6 (L) 39.0 - 52.0 %   MCV 89.1 78.0 - 100.0 fL   MCH 31.8 26.0 - 34.0 pg   MCHC 35.7 30.0 - 36.0 g/dL   RDW 12.4 11.5 - 15.5 %   Platelets 250 150 - 400 K/uL   Neutrophils Relative % 83 %   Neutro Abs 5.2 1.7 - 7.7 K/uL   Lymphocytes Relative 9 %   Lymphs Abs 0.6 (L) 0.7 - 4.0 K/uL   Monocytes Relative 5 %   Monocytes Absolute 0.3 0.1 - 1.0 K/uL   Eosinophils Relative 3 %   Eosinophils Absolute 0.2 0.0 - 0.7 K/uL   Basophils Relative 0 %   Basophils Absolute 0.0 0.0 - 0.1 K/uL  Comprehensive metabolic panel  Result Value Ref Range   Sodium 129 (L) 135 - 145 mmol/L   Potassium 5.8 (H) 3.5 - 5.1 mmol/L   Chloride 102 101 - 111 mmol/L   CO2 19 (L) 22 - 32 mmol/L   Glucose, Bld 196 (H) 65 - 99 mg/dL   BUN 68 (H) 6 - 20 mg/dL   Creatinine, Ser 2.60 (H) 0.61 - 1.24 mg/dL   Calcium 10.2 8.9 - 10.3 mg/dL   Total Protein 8.0 6.5 - 8.1 g/dL   Albumin 4.0 3.5 - 5.0 g/dL   AST 14 (L) 15 - 41 U/L   ALT 11 (L) 17 - 63 U/L   Alkaline Phosphatase 60 38 - 126 U/L   Total Bilirubin 0.6 0.3 - 1.2 mg/dL   GFR calc non Af Amer 23 (L) >60 mL/min   GFR calc Af Amer 27 (L) >60 mL/min    Anion gap 8 5 - 15  Lipase, blood  Result Value Ref Range   Lipase 209 (H) 11 - 51 U/L  Urinalysis, Routine w reflex microscopic (not at [facility])  Result Value Ref Range   Color, Urine YELLOW YELLOW   APPearance CLEAR CLEAR   Specific Gravity, Urine 1.025 1.005 - 1.030   pH 5.5 5.0 - 8.0   Glucose, UA NEGATIVE NEGATIVE mg/dL   Hgb urine dipstick TRACE (A) NEGATIVE   Bilirubin Urine NEGATIVE NEGATIVE   Ketones, ur NEGATIVE NEGATIVE mg/dL   Protein, ur NEGATIVE NEGATIVE mg/dL   Nitrite NEGATIVE NEGATIVE   Leukocytes, UA NEGATIVE NEGATIVE  Troponin I  Result Value Ref Range   Troponin I <0.03 <0.03 ng/mL  Urine microscopic-add on  Result Value Ref Range   Squamous Epithelial / LPF 6-30 (A) NONE SEEN   WBC, UA 6-30 0 - 5 WBC/hpf   RBC / HPF 0-5 0 - 5 RBC/hpf   Bacteria, UA MANY (A) NONE SEEN   Casts GRANULAR CAST (A)  NEGATIVE   Dg Chest 2 View  Result Date: 02/11/2016 CLINICAL DATA:  Right-sided chest pain, weakness, and anorexia for 3 weeks. EXAM: CHEST  2 VIEW COMPARISON:  10/01/2011 FINDINGS: The heart size remains normal. Mild tortuosity of thoracic aorta again noted. Both lungs are clear. No evidence of pneumothorax or pleural effusion. Right lateral fifth rib fracture deformity is seen which appears subacute to chronic in age. Several old mid thoracic vertebral body compression fractures are again seen. A T7 vertebral body compression fracture is seen which is of indeterminate age but is new since prior exam in 2013. IMPRESSION: No active cardiopulmonary disease. Probable subacute to chronic right lateral fifth rib fracture deformity. T7 vertebral body compression fracture is of indeterminate age, but new since 2013 exam. Electronically Signed   By: Earle Gell M.D.   On: 02/11/2016 11:21   Dg Thoracic Spine 2 View  Result Date: 01/23/2016 CLINICAL DATA:  Back pain. EXAM: THORACIC SPINE 2 VIEWS COMPARISON:  09/16/2014. FINDINGS: Mediastinum hilar structures normal. Lungs are  clear. Small right pleural effusion. No pneumothorax. Heart size normal. Biapical pleural-parenchymal thickening noted consistent with scarring. Old right rib fractures. Thoracic spine compression fractures again noted. Degenerative changes thoracic spine. IMPRESSION: 1. Small right pleural effusion. 2. Cardiomegaly.  No pulmonary venous congestion. Electronically Signed   By: Marcello Moores  Register   On: 01/23/2016 08:45   Patient seen by me along with physician assistant. Patient clinically looks well. His main complaint is loss of appetite weight loss right-sided rib pain no nausea no vomiting just not eating or drinking as much as usual.  Workup here with significant findings. Elevated BUN and creatinine elevated potassium. Suggestive prerenal picture. Markedly elevated lipase. No real abdominal pain. Metabolic acidosis. For the metabolic abnormalities patient will require admission IV fluids improvement in the renal function and correction of the elevated potassium.  Will elevated lipase weight loss and decreased appetite CT scan of the abdomen has been ordered.  Patient due to the elevated potassium will receive some IV regular insulin an amp of bicarbonate to help bring the potassium down. EKG shows hyperacute T waves compared to his previous EKG. No significant QRS widening.  Urinalysis be sent for culture. No distinct UTI on the UA but some question with many bacteria.  CRITICAL CARE Performed by: Fredia Sorrow Total critical care time: 30 minutes Critical care time was exclusive of separately billable procedures and treating other patients. Critical care was necessary to treat or prevent imminent or life-threatening deterioration. Critical care was time spent personally by me on the following activities: development of treatment plan with patient and/or surrogate as well as nursing, discussions with consultants, evaluation of patient's response to treatment, examination of patient, obtaining  history from patient or surrogate, ordering and performing treatments and interventions, ordering and review of laboratory studies, ordering and review of radiographic studies, pulse oximetry and re-evaluation of patient's condition.  Critical care time for the hyperkalemia. Patient is alert and oriented lungs are clear bilaterally. Abdomen soft without any significant tenderness. Normal bowel sounds. No neuro deficit.   Fredia Sorrow, MD 02/11/16 [place], MD 02/11/16 1331

## 2016-02-11 NOTE — ED Notes (Signed)
Attempted to call report. Bed not approved by charge nurse at this time

## 2016-02-11 NOTE — ED Provider Notes (Signed)
[facility] DEPT Provider Note   CSN: [id number] Arrival date & time: 02/11/16  [id number]     History   Chief Complaint Chief Complaint  Patient presents with  . Anorexia    HPI Peter York is a 69 y.o. male with a  History of well controlled type 2 DM, htn and hypercholesterolemia presenting with a 3 week history of anorexia with a 10 lb weight loss and increasingly worsened fatigue.  He reports early satiety, but denies abdominal pain, nausea, vomiting, diarrhea or other GI complaints.  His symptoms started with right back pain while riding his horse (reports this is a frequent activity without injury during this ride) which gradually radiated to his right lower rib cage.  He endorses frequent episodes of costochondritis and was seen by his pcp, cxray was negative for rib fractures per patient report, was placed on ibuprofen and a muscle relaxer and his chest wall pain has improved, but is not completely gone.  He denies shortness of breath and pleuritic chest pain. He has found no alleviators for his symptoms.  He has noticed increased fatigue and tachycardia to 115 when he checks his bp at home. His last cbg was 160 yesterday.  The history is provided by the patient.    Past Medical History:  Diagnosis Date  . Adenomatous colon polyp 12/31/2005  . Diabetes mellitus   . Hypercholesterolemia   . Hypertension     Patient Active Problem List   Diagnosis Date Noted  . Family history of coronary arteriosclerosis 04/07/2015  . Type 2 diabetes mellitus without complication ([place]) [id number]  . Hyperlipidemia with target LDL less than 100 10/06/2012  . Hypertension 10/06/2012  . Personal history of adenomatous colonic polyps 07/19/2011    Past Surgical History:  Procedure Laterality Date  . ANAL FISTULECTOMY  10/02/2011   Procedure: FISTULECTOMY ANAL;  Surgeon: Joyice Faster. Cornett, MD;  Location: [facility];  Service: General;  Laterality: N/A;  excision perianal mass     . APPENDECTOMY    . COLONOSCOPY W/ POLYPECTOMY  12/31/2005   Dr. Silvano Rusk  . UMBILICAL HERNIA REPAIR         Home Medications    Prior to Admission medications   Medication Sig Start Date End Date Taking? Authorizing Provider  amLODipine (NORVASC) 10 MG tablet Take 1 tablet (10 mg total) by mouth daily. 12/27/15  Yes Mary-Margaret Hassell Done, FNP  aspirin 81 MG tablet Take 81 mg by mouth daily.   Yes Historical Provider, MD  benazepril (LOTENSIN) 40 MG tablet Take 1 tablet (40 mg total) by mouth daily. 12/27/15  Yes Mary-Margaret Hassell Done, FNP  chlorthalidone (HYGROTON) 25 MG tablet Take 1 tablet (25 mg total) by mouth daily. 12/27/15  Yes Mary-Margaret Hassell Done, FNP  cloNIDine (CATAPRES) 0.1 MG tablet Take 1 tablet (0.1 mg total) by mouth daily. 12/27/15  Yes Mary-Margaret Hassell Done, FNP  cyclobenzaprine (FLEXERIL) 10 MG tablet Take 1 tablet (10 mg total) by mouth 3 (three) times daily as needed for muscle spasms. 01/23/16  Yes Mary-Margaret Hassell Done, FNP  metFORMIN (GLUCOPHAGE) 500 MG tablet TAKE 1 TABLET EVERY MORNING AND 2 TABLET AT BEDTIME 12/27/15  Yes Mary-Margaret Hassell Done, FNP  Omega-3 Fatty Acids (FISH OIL) 1000 MG CAPS Take 2,000 mg by mouth 2 (two) times daily.   Yes Historical Provider, MD  ONE TOUCH ULTRA TEST test strip USE TO CHECK BLOOD SUGAR ONCE A DAY AS INSTRUCTED 01/11/16  Yes Mary-Margaret Hassell Done, FNP  vitamin E 400 UNIT capsule Take 1 capsule (  400 Units total) by mouth daily. 12/27/15  Yes Mary-Margaret Hassell Done, FNP    Family History Family History  Problem Relation Age of Onset  . Colon cancer Father 6  . Colon polyps Father   . Cancer Father     Colon  . Heart attack Maternal Uncle     X 4  . Heart disease Maternal Uncle   . Heart disease Maternal Uncle   . Heart disease Maternal Uncle   . Diabetes Daughter     Social History Social History  Substance Use Topics  . Smoking status: Former Smoker    Quit date: 03/26/1978  . Smokeless tobacco: Never Used  . Alcohol  use Yes     Comment: socially     Allergies   Patient has no known allergies.   Review of Systems Review of Systems  Constitutional: Positive for fatigue. Negative for fever.  HENT: Negative for congestion and sore throat.   Eyes: Negative.   Respiratory: Negative for chest tightness and shortness of breath.   Cardiovascular: Positive for chest pain and palpitations. Negative for leg swelling.  Gastrointestinal: Negative for abdominal pain, nausea and vomiting.  Endocrine: Negative for polydipsia and polyuria.  Genitourinary: Negative.   Musculoskeletal: Negative for arthralgias, joint swelling and neck pain.  Skin: Negative.  Negative for rash and wound.  Neurological: Positive for light-headedness. Negative for dizziness, weakness, numbness and headaches.  Psychiatric/Behavioral: Negative.      Physical Exam Updated Vital Signs BP 144/87   Pulse 74   Temp 97.6 F (36.4 C) (Oral)   Resp 17   Ht 5\' 8"  (1.727 m)   Wt 71.7 kg   SpO2 98%   BMI 24.02 kg/m   Physical Exam  Constitutional: He appears well-developed and well-nourished.  HENT:  Head: Normocephalic and atraumatic.  Eyes: Conjunctivae are normal.  Neck: Normal range of motion.  Cardiovascular: Normal rate, regular rhythm, normal heart sounds and intact distal pulses.   Pulmonary/Chest: Effort normal and breath sounds normal. He has no wheezes.  Mild ttp right lower anterior chest wall.  No crepitus, no palpable deformity.  Abdominal: Soft. Bowel sounds are normal. He exhibits no distension and no mass. There is no tenderness. There is no guarding.  Musculoskeletal: Normal range of motion. He exhibits no edema or tenderness.  Neurological: He is alert.  Skin: Skin is warm and dry.  Psychiatric: He has a normal mood and affect.  Nursing note and vitals reviewed.    ED Treatments / Results  Labs  Results for orders placed or performed during the hospital encounter of 02/11/16  CBC with Differential    Result Value Ref Range   WBC 6.3 4.0 - 10.5 K/uL   RBC 3.77 (L) 4.22 - 5.81 MIL/uL   Hemoglobin 12.0 (L) 13.0 - 17.0 g/dL   HCT 33.6 (L) 39.0 - 52.0 %   MCV 89.1 78.0 - 100.0 fL   MCH 31.8 26.0 - 34.0 pg   MCHC 35.7 30.0 - 36.0 g/dL   RDW 12.4 11.5 - 15.5 %   Platelets 250 150 - 400 K/uL   Neutrophils Relative % 83 %   Neutro Abs 5.2 1.7 - 7.7 K/uL   Lymphocytes Relative 9 %   Lymphs Abs 0.6 (L) 0.7 - 4.0 K/uL   Monocytes Relative 5 %   Monocytes Absolute 0.3 0.1 - 1.0 K/uL   Eosinophils Relative 3 %   Eosinophils Absolute 0.2 0.0 - 0.7 K/uL   Basophils Relative 0 %  Basophils Absolute 0.0 0.0 - 0.1 K/uL  Comprehensive metabolic panel  Result Value Ref Range   Sodium 129 (L) 135 - 145 mmol/L   Potassium 5.8 (H) 3.5 - 5.1 mmol/L   Chloride 102 101 - 111 mmol/L   CO2 19 (L) 22 - 32 mmol/L   Glucose, Bld 196 (H) 65 - 99 mg/dL   BUN 68 (H) 6 - 20 mg/dL   Creatinine, Ser 2.60 (H) 0.61 - 1.24 mg/dL   Calcium 10.2 8.9 - 10.3 mg/dL   Total Protein 8.0 6.5 - 8.1 g/dL   Albumin 4.0 3.5 - 5.0 g/dL   AST 14 (L) 15 - 41 U/L   ALT 11 (L) 17 - 63 U/L   Alkaline Phosphatase 60 38 - 126 U/L   Total Bilirubin 0.6 0.3 - 1.2 mg/dL   GFR calc non Af Amer 23 (L) >60 mL/min   GFR calc Af Amer 27 (L) >60 mL/min   Anion gap 8 5 - 15  Lipase, blood  Result Value Ref Range   Lipase 209 (H) 11 - 51 U/L  Urinalysis, Routine w reflex microscopic (not at [facility])  Result Value Ref Range   Color, Urine YELLOW YELLOW   APPearance CLEAR CLEAR   Specific Gravity, Urine 1.025 1.005 - 1.030   pH 5.5 5.0 - 8.0   Glucose, UA NEGATIVE NEGATIVE mg/dL   Hgb urine dipstick TRACE (A) NEGATIVE   Bilirubin Urine NEGATIVE NEGATIVE   Ketones, ur NEGATIVE NEGATIVE mg/dL   Protein, ur NEGATIVE NEGATIVE mg/dL   Nitrite NEGATIVE NEGATIVE   Leukocytes, UA NEGATIVE NEGATIVE  Troponin I  Result Value Ref Range   Troponin I <0.03 <0.03 ng/mL  Urine microscopic-add on  Result Value Ref Range   Squamous Epithelial  / LPF 6-30 (A) NONE SEEN   WBC, UA 6-30 0 - 5 WBC/hpf   RBC / HPF 0-5 0 - 5 RBC/hpf   Bacteria, UA MANY (A) NONE SEEN   Casts GRANULAR CAST (A) NEGATIVE   Ct Abdomen Pelvis Wo Contrast  Result Date: 02/11/2016 CLINICAL DATA:  Loss of appetite and right-sided rib pain. EXAM: CT ABDOMEN AND PELVIS WITHOUT CONTRAST TECHNIQUE: Multidetector CT imaging of the abdomen and pelvis was performed following the standard protocol without IV contrast. COMPARISON:  None. FINDINGS: The lack of intravenous contrast limits the ability to evaluate solid abdominal organs. Lower chest: Limited visualization of the lower thorax demonstrates minimal dependent subpleural ground-glass atelectasis, right greater than left. No discrete focal airspace opacities. No pleural effusion. Normal heart size. Extensive coronary artery calcifications. No pericardial effusion. Hepatobiliary: Normal hepatic contour. Scattered punctate hepatic granuloma. Normal noncontrast appearance of the gallbladder given degree distention. No radiopaque gallstones. No ascites. Pancreas: Normal noncontrast appearance of the pancreas. Spleen: Normal noncontrast appearance of the spleen Adrenals/Urinary Tract: No renal stones. Bilateral hypo attenuating renal lesions are incompletely characterized without intravenous contrast though statistically represent renal cysts. There is a minimal amount of grossly symmetric bilateral perinephric stranding, likely age and body habitus related. No urinary obstruction. No renal stones are seen along expected course of either ureter or the urinary bladder. Normal noncontrast appearance of the urinary bladder given degree distention. Normal noncontrast appearance of the bilateral adrenal glands. Stomach/Bowel: Ingested enteric contrast extends to the level of the rectum. Scattered colonic diverticulosis without evidence of diverticulitis. Normal noncontrast appearance of the terminal ileum. The appendix is not visualized  compatible with provided surgical history. No pneumoperitoneum, pneumatosis or portal venous gas. Vascular/Lymphatic: Rather extensive calcified atherosclerotic  plaque within a normal caliber abdominal aorta. No bulky retroperitoneal, mesenteric, pelvic or inguinal lymphadenopathy on this noncontrast examination. Reproductive: The prostate is enlarged with mass effect on the undersurface urinary bladder. Vascular calcifications are seen within the prostate. Other: Regional soft tissues appear normal. Musculoskeletal: No acute or aggressive osseous abnormalities with special attention paid to the right-sided ribs. Old / healed fracture involving the anterior aspect of the right tenth rib. Old mild (< 25%) compression deformity involving the superior endplate of the T9 vertebral body with associated prominent anterior disc osteophyte complex involving the T9-T10 intervertebral disc space. IMPRESSION: 1. No explanation for patient's loss of appetite, weight loss and right-sided rib pain. Specifically, no evidence of nephrolithiasis, urinary or enteric obstruction. No displaced right-sided rib fractures. 2. Colonic diverticulosis without evidence of diverticulitis. 3. Coronary artery calcifications. 4. Aortic Atherosclerosis (ICD10-170.0) Electronically Signed   By: Sandi Mariscal M.D.   On: 02/11/2016 15:22   Dg Chest 2 View  Result Date: 02/11/2016 CLINICAL DATA:  Right-sided chest pain, weakness, and anorexia for 3 weeks. EXAM: CHEST  2 VIEW COMPARISON:  10/01/2011 FINDINGS: The heart size remains normal. Mild tortuosity of thoracic aorta again noted. Both lungs are clear. No evidence of pneumothorax or pleural effusion. Right lateral fifth rib fracture deformity is seen which appears subacute to chronic in age. Several old mid thoracic vertebral body compression fractures are again seen. A T7 vertebral body compression fracture is seen which is of indeterminate age but is new since prior exam in 2013. IMPRESSION:  No active cardiopulmonary disease. Probable subacute to chronic right lateral fifth rib fracture deformity. T7 vertebral body compression fracture is of indeterminate age, but new since 2013 exam. Electronically Signed   By: Earle Gell M.D.   On: 02/11/2016 11:21   Dg Thoracic Spine 2 View  Result Date: 01/23/2016 CLINICAL DATA:  Back pain. EXAM: THORACIC SPINE 2 VIEWS COMPARISON:  09/16/2014. FINDINGS: Mediastinum hilar structures normal. Lungs are clear. Small right pleural effusion. No pneumothorax. Heart size normal. Biapical pleural-parenchymal thickening noted consistent with scarring. Old right rib fractures. Thoracic spine compression fractures again noted. Degenerative changes thoracic spine. IMPRESSION: 1. Small right pleural effusion. 2. Cardiomegaly.  No pulmonary venous congestion. Electronically Signed   By: Marcello Moores  Register   On: 01/23/2016 08:45    EKG  EKG Interpretation  Date/Time:  Saturday February 11 2016 10:37:40 EST Ventricular Rate:  75 PR Interval:    QRS Duration: 90 QT Interval:  341 QTC Calculation: 381 R Axis:   15 Text Interpretation:  Sinus rhythm Abnormal R-wave progression, early transition Borderline T abnormalities, inferior leads Hyperkalemia Confirmed by Rogene Houston  MD, SCOTT [phone]) on 02/11/2016 12:46:50 PM       Radiology Ct Abdomen Pelvis Wo Contrast  Result Date: 02/11/2016 CLINICAL DATA:  Loss of appetite and right-sided rib pain. EXAM: CT ABDOMEN AND PELVIS WITHOUT CONTRAST TECHNIQUE: Multidetector CT imaging of the abdomen and pelvis was performed following the standard protocol without IV contrast. COMPARISON:  None. FINDINGS: The lack of intravenous contrast limits the ability to evaluate solid abdominal organs. Lower chest: Limited visualization of the lower thorax demonstrates minimal dependent subpleural ground-glass atelectasis, right greater than left. No discrete focal airspace opacities. No pleural effusion. Normal heart size. Extensive  coronary artery calcifications. No pericardial effusion. Hepatobiliary: Normal hepatic contour. Scattered punctate hepatic granuloma. Normal noncontrast appearance of the gallbladder given degree distention. No radiopaque gallstones. No ascites. Pancreas: Normal noncontrast appearance of the pancreas. Spleen: Normal noncontrast appearance of the spleen  Adrenals/Urinary Tract: No renal stones. Bilateral hypo attenuating renal lesions are incompletely characterized without intravenous contrast though statistically represent renal cysts. There is a minimal amount of grossly symmetric bilateral perinephric stranding, likely age and body habitus related. No urinary obstruction. No renal stones are seen along expected course of either ureter or the urinary bladder. Normal noncontrast appearance of the urinary bladder given degree distention. Normal noncontrast appearance of the bilateral adrenal glands. Stomach/Bowel: Ingested enteric contrast extends to the level of the rectum. Scattered colonic diverticulosis without evidence of diverticulitis. Normal noncontrast appearance of the terminal ileum. The appendix is not visualized compatible with provided surgical history. No pneumoperitoneum, pneumatosis or portal venous gas. Vascular/Lymphatic: Rather extensive calcified atherosclerotic plaque within a normal caliber abdominal aorta. No bulky retroperitoneal, mesenteric, pelvic or inguinal lymphadenopathy on this noncontrast examination. Reproductive: The prostate is enlarged with mass effect on the undersurface urinary bladder. Vascular calcifications are seen within the prostate. Other: Regional soft tissues appear normal. Musculoskeletal: No acute or aggressive osseous abnormalities with special attention paid to the right-sided ribs. Old / healed fracture involving the anterior aspect of the right tenth rib. Old mild (< 25%) compression deformity involving the superior endplate of the T9 vertebral body with associated  prominent anterior disc osteophyte complex involving the T9-T10 intervertebral disc space. IMPRESSION: 1. No explanation for patient's loss of appetite, weight loss and right-sided rib pain. Specifically, no evidence of nephrolithiasis, urinary or enteric obstruction. No displaced right-sided rib fractures. 2. Colonic diverticulosis without evidence of diverticulitis. 3. Coronary artery calcifications. 4. Aortic Atherosclerosis (ICD10-170.0) Electronically Signed   By: Sandi Mariscal M.D.   On: 02/11/2016 15:22   Dg Chest 2 View  Result Date: 02/11/2016 CLINICAL DATA:  Right-sided chest pain, weakness, and anorexia for 3 weeks. EXAM: CHEST  2 VIEW COMPARISON:  10/01/2011 FINDINGS: The heart size remains normal. Mild tortuosity of thoracic aorta again noted. Both lungs are clear. No evidence of pneumothorax or pleural effusion. Right lateral fifth rib fracture deformity is seen which appears subacute to chronic in age. Several old mid thoracic vertebral body compression fractures are again seen. A T7 vertebral body compression fracture is seen which is of indeterminate age but is new since prior exam in 2013. IMPRESSION: No active cardiopulmonary disease. Probable subacute to chronic right lateral fifth rib fracture deformity. T7 vertebral body compression fracture is of indeterminate age, but new since 2013 exam. Electronically Signed   By: Earle Gell M.D.   On: 02/11/2016 11:21    Procedures Procedures (including critical care time)  Medications Ordered in ED Medications  sodium chloride 0.9 % bolus 1,000 mL (1,000 mLs Intravenous New Bag/Given 02/11/16 1212)  iopamidol (ISOVUE-300) 61 % injection 30 mL (30 mLs Oral Contrast Given 02/11/16 1234)  sodium bicarbonate injection 50 mEq (50 mEq Intravenous Given 02/11/16 1310)  insulin aspart (novoLOG) injection 5 Units (5 Units Intravenous Given 02/11/16 1314)     Initial Impression / Assessment and Plan / ED Course  I have reviewed the triage vital  signs and the nursing notes.  Pertinent labs & imaging results that were available during my care of the patient were reviewed by me and considered in my medical decision making (see chart for details).  Clinical Course     Labs reviewed, CT scan reviewed and discussed with patient.  He will be admitted for tx of hyperkalemia, renal failure and hyponatremia.  Discussed with hospitalist who agrees with admission. Temp orders placed.   Final Clinical Impressions(s) / ED Diagnoses  Final diagnoses:  Acute renal injury ([place])  Hyperkalemia  Hyponatremia  Anorexia  Fatigue, unspecified type  Recent unexplained weight loss    New Prescriptions New Prescriptions   No medications on file     Evalee Jefferson, PA-C 02/11/16 [place], MD 02/11/16 [id number]

## 2016-02-11 NOTE — ED Notes (Signed)
Patient transported to CT 

## 2016-02-11 NOTE — H&P (Signed)
Patient Demographics:    Peter York, is a 70 y.o. male  MRN: [id number]   DOB - November 27, 1945  Admit Date - 02/11/2016  Outpatient Primary MD for the patient is Mary-Margaret Hassell Done, FNP   Assessment & Plan:    Active Problems:   Acute renal failure (ARF) (HCC)   1) Acute Renal failure on CKD- Cr- 2.6, from baseline- 1.4-1.7. Likely from Poor PO intake, with NSAID use- Ibuprofen 800mg  TID X1 wk, and aggravated by continued diuretic-chlorthalidone and ACE use. UA- trace hgb. - IVF N/s- 150cc/hr x1 day - Intake, output - Serum Electrophoresis - Urine electrophoresis- 24hr - Uric acid  2)Hyperkalemia- K- 5.8. Likely from ACE-inh use and AKI. EKG with ? peaked t waves in chest leads- but present in EKG- 08/2014. Was given bicarb, insulin - 5 u in ED, no dextrose as CBG- 196. - Kayexalate- 15g once. - Bmet am  3. Hyponatremia- 129, hypovolemic from poor Po intake, diuretic use. Baseline- 140s. - Serum osm - urine osm - Urine na- 57, elevated, likely reflects diuretic use. - Hydrate, follow Bmet am  4. Chest pain ~4 weeks duration, Right side, improved. Chest xray- shows a subacute or chronic fracture in that location, and several thoracic compression fractures. Ibuprofen and muscle relaxants with improvement. Pt does horse riding, he cannot remember any trauma. Concern for pathologic fracture, considering several fractures, mild normocytic anemia, AKI, weight loss, fatigue, anorexia, will evaluate for MM, gammopathy. Pts UA- neg for protein, but expected to be bland in MM. - Trops x1 negative, EKG- not suggestive of ischemia. -  serum PEP, urine IFE- 24hr - Tylenol for pain.  5. Weight loss and anorexia- no family hx of cancers. Last colonoscopy- ~1 year ago. Possible related to Poor Po intake from anorexia, and  azotemia. Remote smoking hx- quit ~40 yrs ago. CT abdomen was done in the Ed for anorexia - unremarkable. At this time, will not get imaging of chest, as chest pain is explained.   6.  Hypertension- Home meds- Chlorthalidone 25, benazepril- 40, Clonidine 0.1mg  daily, norvasc 10 - Hold Diuretics and ACE - Change clonidine to BID - Cont Norvasc  7. Non anion gap metabolic acidosis- AG- 8, Bicarb- 19, Possibly from renal dysfunction, ?RTA, no diarrhea hx. - Hydrate, follow Co2   With History of - Reviewed by me  Past Medical History:  Diagnosis Date  . Adenomatous colon polyp 12/31/2005  . Diabetes mellitus   . Hypercholesterolemia   . Hypertension       Past Surgical History:  Procedure Laterality Date  . ANAL FISTULECTOMY  10/02/2011   Procedure: FISTULECTOMY ANAL;  Surgeon: Joyice Faster. Cornett, MD;  Location: [facility];  Service: General;  Laterality: N/A;  excision perianal mass   . APPENDECTOMY    . COLONOSCOPY W/ POLYPECTOMY  12/31/2005   Dr. Silvano Rusk  . UMBILICAL HERNIA REPAIR        Chief Complaint  Patient  presents with  . Anorexia      HPI:    Peter York  is a 70 y.o. male, with PMH- HTN, DM, presented today with c/o generalized weakness, for ~ 3 weeks duration, with anorexia and 10 Lb weight loss in th past 3 weeks.  Pts problems started about a month ago with right sided chest pain, 10/10 with made it difficult to breath. He denies any trauma. He saw his PCP, was given ibuprofen 800mg  TID which he took for ~1 week, d/c ~ 1 week ago and muscle relaxants for rib fracture, this helped, today his pain is 1/10. He denies hx of heart attacks, prior chest pain. Remote smoking hx- quit ~1yrs ago. No alcohol or drug use. Since then he noticed gradual decline in his appetite, he has continued taking all his meds which includes benazepril, chlothalidone. No diarrhea, vomiting. No fever. He has a hx of colonic polyps, last colonoscopy- ~ 1 year ago. Denies  personal of malignancy, per chart father had colon cancer, but patient cannot recollect any malignancies in the family.  In the Ed pt had a Ct abd to evaluate anorexia, which was unremarkable. Chest xray- compression fractures, new T7 fracture, right subacute/chronic rib fracture.     Review of systems:    In addition to the HPI above,   A full 12 point Review of 10 Systems was done, except as stated above, all other Review of 10 Systems were negative.    Social History:  Reviewed by me    Social History  Substance Use Topics  . Smoking status: Former Smoker    Quit date: 03/26/1978  . Smokeless tobacco: Never Used  . Alcohol use Yes     Comment: socially       Family History :  Reviewed by me    Family History  Problem Relation Age of Onset  . Colon cancer Father 44  . Colon polyps Father   . Cancer Father     Colon  . Heart attack Maternal Uncle     X 4  . Heart disease Maternal Uncle   . Heart disease Maternal Uncle   . Heart disease Maternal Uncle   . Diabetes Daughter      Home Medications:   Prior to Admission medications   Medication Sig Start Date End Date Taking? Authorizing Provider  amLODipine (NORVASC) 10 MG tablet Take 1 tablet (10 mg total) by mouth daily. 12/27/15  Yes Mary-Margaret Hassell Done, FNP  aspirin 81 MG tablet Take 81 mg by mouth daily.   Yes Historical Provider, MD  benazepril (LOTENSIN) 40 MG tablet Take 1 tablet (40 mg total) by mouth daily. 12/27/15  Yes Mary-Margaret Hassell Done, FNP  chlorthalidone (HYGROTON) 25 MG tablet Take 1 tablet (25 mg total) by mouth daily. 12/27/15  Yes Mary-Margaret Hassell Done, FNP  cloNIDine (CATAPRES) 0.1 MG tablet Take 1 tablet (0.1 mg total) by mouth daily. 12/27/15  Yes Mary-Margaret Hassell Done, FNP  cyclobenzaprine (FLEXERIL) 10 MG tablet Take 1 tablet (10 mg total) by mouth 3 (three) times daily as needed for muscle spasms. 01/23/16  Yes Mary-Margaret Hassell Done, FNP  metFORMIN (GLUCOPHAGE) 500 MG tablet TAKE 1 TABLET EVERY  MORNING AND 2 TABLET AT BEDTIME 12/27/15  Yes Mary-Margaret Hassell Done, FNP  Omega-3 Fatty Acids (FISH OIL) 1000 MG CAPS Take 2,000 mg by mouth 2 (two) times daily.   Yes Historical Provider, MD  ONE TOUCH ULTRA TEST test strip USE TO CHECK BLOOD SUGAR ONCE A DAY AS INSTRUCTED 01/11/16  Yes  Mary-Margaret Hassell Done, FNP  vitamin E 400 UNIT capsule Take 1 capsule (400 Units total) by mouth daily. 12/27/15  Yes Mary-Margaret Hassell Done, FNP     Allergies:     Allergies  Allergen Reactions  . Crestor [Rosuvastatin Calcium]     "like to killed me." joint pain, unable to get OOB     Physical Exam:   Vitals  Blood pressure 130/70, pulse 77, temperature 98.7 F (37.1 C), temperature source Oral, resp. rate 20, height 5\' 8"  (1.727 m), weight 71.7 kg (158 lb), SpO2 100 %.  Physical Examination: General appearance - alert, well appearing, and in no distress Mental status - alert, oriented to person, place, and time Eyes - sclera anicteric Mouth- Dry oral mucosa Neck - supple, Chest - clear  to auscultation bilaterally, symmetrical air movement, minimal tenderness to palpation over right lat chest wall. Heart - S1 and S2 normal, regular Abdomen - soft, nontender, nondistended, no masses or organomegaly Neurological - screening mental status exam normal, neck supple without rigidity, cranial nerves II through XII intact, strenght 5/5 all extremities. Extremities - no pedal edema noted, intact peripheral pulses Skin - warm, dry   Data Review:    CBC  Recent Labs Lab 02/11/16 1034  WBC 6.3  HGB 12.0*  HCT 33.6*  PLT 250  MCV 89.1  MCH 31.8  MCHC 35.7  RDW 12.4  LYMPHSABS 0.6*  MONOABS 0.3  EOSABS 0.2  BASOSABS 0.0   ------------------------------------------------------------------------------------------------------------------  Chemistries   Recent Labs Lab 02/11/16 1034  NA 129*  K 5.8*  CL 102  CO2 19*  GLUCOSE 196*  BUN 68*  CREATININE 2.60*  CALCIUM 10.2  AST 14*  ALT  11*  ALKPHOS 60  BILITOT 0.6   ------------------------------------------------------------------------------------------------------------------ estimated creatinine clearance is 25.6 mL/min (by C-G formula based on SCr of 2.6 mg/dL (H)). ------------------------------------------------------------------------------------------------------------------ No results for input(s): TSH, T4TOTAL, T3FREE, THYROIDAB in the last 72 hours.  Invalid input(s): FREET3   Coagulation profile No results for input(s): INR, PROTIME in the last 168 hours. ------------------------------------------------------------------------------------------------------------------- No results for input(s): DDIMER in the last 72 hours. -------------------------------------------------------------------------------------------------------------------  Cardiac Enzymes  Recent Labs Lab 02/11/16 1034  TROPONINI <0.03   ------------------------------------------------------------------------------------------------------------------ No results found for: BNP   ---------------------------------------------------------------------------------------------------------------  Urinalysis    Component Value Date/Time   COLORURINE YELLOW 02/11/2016 [place] 02/11/2016 1054   LABSPEC 1.025 02/11/2016 1054   PHURINE 5.5 02/11/2016 1054   GLUCOSEU NEGATIVE 02/11/2016 1054   HGBUR TRACE (A) 02/11/2016 1054   BILIRUBINUR NEGATIVE 02/11/2016 1054   KETONESUR NEGATIVE 02/11/2016 1054   PROTEINUR NEGATIVE 02/11/2016 1054   NITRITE NEGATIVE 02/11/2016 1054   LEUKOCYTESUR NEGATIVE 02/11/2016 1054    ----------------------------------------------------------------------------------------------------------------   Imaging Results:    Ct Abdomen Pelvis Wo Contrast  Result Date: 02/11/2016 CLINICAL DATA:  Loss of appetite and right-sided rib pain. EXAM: CT ABDOMEN AND PELVIS WITHOUT CONTRAST TECHNIQUE:  Multidetector CT imaging of the abdomen and pelvis was performed following the standard protocol without IV contrast. COMPARISON:  None. FINDINGS: The lack of intravenous contrast limits the ability to evaluate solid abdominal organs. Lower chest: Limited visualization of the lower thorax demonstrates minimal dependent subpleural ground-glass atelectasis, right greater than left. No discrete focal airspace opacities. No pleural effusion. Normal heart size. Extensive coronary artery calcifications. No pericardial effusion. Hepatobiliary: Normal hepatic contour. Scattered punctate hepatic granuloma. Normal noncontrast appearance of the gallbladder given degree distention. No radiopaque gallstones. No ascites. Pancreas: Normal noncontrast appearance of the pancreas. Spleen: Normal noncontrast  appearance of the spleen Adrenals/Urinary Tract: No renal stones. Bilateral hypo attenuating renal lesions are incompletely characterized without intravenous contrast though statistically represent renal cysts. There is a minimal amount of grossly symmetric bilateral perinephric stranding, likely age and body habitus related. No urinary obstruction. No renal stones are seen along expected course of either ureter or the urinary bladder. Normal noncontrast appearance of the urinary bladder given degree distention. Normal noncontrast appearance of the bilateral adrenal glands. Stomach/Bowel: Ingested enteric contrast extends to the level of the rectum. Scattered colonic diverticulosis without evidence of diverticulitis. Normal noncontrast appearance of the terminal ileum. The appendix is not visualized compatible with provided surgical history. No pneumoperitoneum, pneumatosis or portal venous gas. Vascular/Lymphatic: Rather extensive calcified atherosclerotic plaque within a normal caliber abdominal aorta. No bulky retroperitoneal, mesenteric, pelvic or inguinal lymphadenopathy on this noncontrast examination. Reproductive: The  prostate is enlarged with mass effect on the undersurface urinary bladder. Vascular calcifications are seen within the prostate. Other: Regional soft tissues appear normal. Musculoskeletal: No acute or aggressive osseous abnormalities with special attention paid to the right-sided ribs. Old / healed fracture involving the anterior aspect of the right tenth rib. Old mild (< 25%) compression deformity involving the superior endplate of the T9 vertebral body with associated prominent anterior disc osteophyte complex involving the T9-T10 intervertebral disc space. IMPRESSION: 1. No explanation for patient's loss of appetite, weight loss and right-sided rib pain. Specifically, no evidence of nephrolithiasis, urinary or enteric obstruction. No displaced right-sided rib fractures. 2. Colonic diverticulosis without evidence of diverticulitis. 3. Coronary artery calcifications. 4. Aortic Atherosclerosis (ICD10-170.0) Electronically Signed   By: Sandi Mariscal M.D.   On: 02/11/2016 15:22   Dg Chest 2 View  Result Date: 02/11/2016 CLINICAL DATA:  Right-sided chest pain, weakness, and anorexia for 3 weeks. EXAM: CHEST  2 VIEW COMPARISON:  10/01/2011 FINDINGS: The heart size remains normal. Mild tortuosity of thoracic aorta again noted. Both lungs are clear. No evidence of pneumothorax or pleural effusion. Right lateral fifth rib fracture deformity is seen which appears subacute to chronic in age. Several old mid thoracic vertebral body compression fractures are again seen. A T7 vertebral body compression fracture is seen which is of indeterminate age but is new since prior exam in 2013. IMPRESSION: No active cardiopulmonary disease. Probable subacute to chronic right lateral fifth rib fracture deformity. T7 vertebral body compression fracture is of indeterminate age, but new since 2013 exam. Electronically Signed   By: Earle Gell M.D.   On: 02/11/2016 11:21   DVT Prophylaxis -heparin AM Labs Ordered, also please review  Full Orders  Family Communication: Admission, patients condition and plan of care including tests being ordered have been discussed with the patient and wife who indicate understanding and agree with the plan   Code Status - Full Code  Likely DC to  [place].  Bethena Roys M.D on 02/11/2016 at 6:32 PM Pager- [place] to 7pm - Pager - [phone]  After 7pm go to [email or web address] - password [facility]  [facility] - Office  [phone]

## 2016-02-11 NOTE — ED Notes (Signed)
Called [phone], nurse unable to take report at this time. She will call me back

## 2016-02-11 NOTE — ED Triage Notes (Addendum)
Pt reports right sided rib pain, loss of appetite for last several weeks. Pt reports was riding a horse x5 days and reports rib pain ever since. nad noted. Airway patent.pt denies any known injury.pt denies any GI/GU symptoms.

## 2016-02-12 DIAGNOSIS — N179 Acute kidney failure, unspecified: Secondary | ICD-10-CM | POA: Diagnosis not present

## 2016-02-12 DIAGNOSIS — R634 Abnormal weight loss: Secondary | ICD-10-CM

## 2016-02-12 DIAGNOSIS — E871 Hypo-osmolality and hyponatremia: Secondary | ICD-10-CM

## 2016-02-12 DIAGNOSIS — R5383 Other fatigue: Secondary | ICD-10-CM

## 2016-02-12 LAB — BASIC METABOLIC PANEL
ANION GAP: 6 (ref 5–15)
BUN: 69 mg/dL — AB (ref 6–20)
CALCIUM: 9 mg/dL (ref 8.9–10.3)
CO2: 22 mmol/L (ref 22–32)
Chloride: 105 mmol/L (ref 101–111)
Creatinine, Ser: 1.75 mg/dL — ABNORMAL HIGH (ref 0.61–1.24)
GFR calc Af Amer: 44 mL/min — ABNORMAL LOW (ref >60)
GFR, EST NON AFRICAN AMERICAN: 38 mL/min — AB (ref >60)
GLUCOSE: 137 mg/dL — AB (ref 65–99)
Potassium: 5.3 mmol/L — ABNORMAL HIGH (ref 3.5–5.1)
Sodium: 133 mmol/L — ABNORMAL LOW (ref 135–145)

## 2016-02-12 LAB — GLUCOSE, CAPILLARY: GLUCOSE-CAPILLARY: 118 mg/dL — AB (ref 65–99)

## 2016-02-12 LAB — SODIUM, URINE, RANDOM: Sodium, Ur: 57 mmol/L

## 2016-02-12 LAB — URIC ACID: Uric Acid, Serum: 8.5 mg/dL — ABNORMAL HIGH (ref 4.4–7.6)

## 2016-02-12 MED ORDER — SODIUM CHLORIDE 0.9 % IV SOLN
INTRAVENOUS | Status: DC
  Administered 2016-02-12: 06:00:00 via INTRAVENOUS

## 2016-02-12 MED ORDER — HYDRALAZINE HCL 25 MG PO TABS: 25.0000 mg | ORAL_TABLET | Freq: Three times a day (TID) | ORAL | Status: DC

## 2016-02-12 MED ORDER — SODIUM POLYSTYRENE SULFONATE 15 GM/60ML PO SUSP
15.0000 g | Freq: Once | ORAL | Status: AC
  Administered 2016-02-12: 15 g via ORAL
  Filled 2016-02-12: qty 60

## 2016-02-12 MED ORDER — HYDRALAZINE HCL 25 MG PO TABS: 25.0000 mg | ORAL_TABLET | Freq: Three times a day (TID) | ORAL | 0 refills | Status: DC

## 2016-02-12 MED ORDER — ASPIRIN 81 MG PO CHEW: 81.0000 mg | CHEWABLE_TABLET | Freq: Every day | ORAL | Status: DC

## 2016-02-12 NOTE — Progress Notes (Signed)
Patient states understanding of discharge instructions.  

## 2016-02-12 NOTE — Discharge Summary (Signed)
Peter York [id number] DOB: 1945-12-19 DOA: 02/11/2016  PCP: Chevis Pretty, FNP  Admit date: 02/11/2016  Discharge date: 02/12/2016  Admitted From: Home   Disposition:  Home   Recommendations for Outpatient Follow-up:   Follow up with PCP in 1-2 weeks  PCP Please obtain BMP/CBC, 2 view CXR in 1week,  (see Discharge instructions)   PCP Please follow up on the following pending results: Follow pending serum and urine electrophoresis results, monitor BMP and blood pressure closely, monitor CBGs closely. Glucophage has been stopped as his sugars were borderline and he had metabolic acidosis.   Home Health: None Equipment/Devices: None  Consultations: None Discharge Condition: Fair   CODE STATUS: Full Diet Recommendation: Low carbohydrate heart healthy   Chief Complaint  Patient presents with  . Anorexia     Brief history of present illness from the day of admission and additional interim summary      Peter York  is a 70 y.o. male, with PMH- HTN, DM, presented today with c/o generalized weakness, for ~ 3 weeks duration, with anorexia and 10 Lb weight loss in th past 3 weeks.  Pts problems started about a month ago with right sided chest pain, 10/10 with made it difficult to breath. He denies any trauma. He saw his PCP, was given ibuprofen 800mg  TID which he took for ~1 week, d/c ~ 1 week ago and muscle relaxants for rib fracture, this helped, today his pain is 1/10. He denies hx of heart attacks, prior chest pain. Remote smoking hx- quit ~60yrs ago. No alcohol or drug use. Since then he noticed gradual decline in his appetite, he has continued taking all his meds which includes benazepril, chlothalidone. No diarrhea, vomiting. No fever. He has a hx of colonic polyps, last colonoscopy- ~ 1 year ago.  Denies personal of malignancy, per chart father had colon cancer, but patient cannot recollect any malignancies in the family.  In the Ed pt had a Ct abd to evaluate anorexia, which was unremarkable. Chest xray- compression fractures, new T7 fracture, right subacute/chronic rib fracture.   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 Baseline creatinine close to 1.7. ARF caused by a combination of taking multiple NSAIDs for rib pain and being on ACE inhibitor. With IV fluids and his renal function has stabilized and now is close to baseline. Requested him to stop using over-the-counter NSAIDs. We'll hold ACE inhibitor upon discharge. PCP to recheck BMP next visit also monitor blood pressure, note this serum and protein electrophoresis were ordered in the ER upon admission results are pending. Patient has also been requested to follow with nephrology within a week.  2. Metabolic acidosis, persistent hyperkalemia. 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 Stop ACE inhibitor, stop Glucophage, patient received Kayexalate, repeat BMP by PCP in 1-2 days and also outpatient renal follow-up.  3. DM type II. Stop Glucophage due to #2 above and his age. CBGs are stable  PCP to continue to monitor. Avoid Glucophage at his age.  4. HTN. Continue home medications with the exception of ACE inhibitor. Will add hydralazine instead.  5. Right-sided pleuritic chest pain ongoing for 26 weeks, he has had rib fracture in that area previously from a horse riding accident, currently x-rays inconclusive pain is better requested to take Tylenol if needed outpatient. EKG troponin negative. Note he had nonspecific T-spine compressive fractures which are likely old as he is completely symptom free from that standpoint, during admission serum and urine electrophoresis were ordered results are pending request PCP to monitor next  visit.  6. Hyponatremia. From dehydration. Resolved.  7. Anorexia and weight loss. Likely anorexia from renal failure, he feels better, request PCP to monitor appetite and do age-appropriate outpatient weight loss workup.    Discharge diagnosis     Principal Problem:   AKI (acute kidney injury) ([place]) Active Problems:   Hypertension   Type 2 diabetes mellitus without complication (HCC)   Fatigue   Hyponatremia   Recent unexplained weight loss    Discharge instructions    Discharge Instructions    Discharge instructions    Complete by:  As directed    Follow with Primary MD Mary-Margaret Hassell Done, FNP in 1-2 days and requested Renal doctor in 1 week, please follow on serum and urine electrophoresis tests which are pending.  Get CBC, CMP, 2 view Chest X ray checked  by Primary MD 2-3 days ( we routinely change or add medications that can affect your baseline labs and fluid status, therefore we recommend that you get the mentioned basic workup next visit with your PCP, your PCP may decide not to get them or add new tests based on their clinical decision)   Activity: As tolerated with Full fall precautions use walker/cane & assistance as needed   Disposition Home    Diet:    Heart Healthy  Low Carb  For Heart failure patients - Check your Weight same time everyday, if you gain over 2 pounds, or you develop in leg swelling, experience more shortness of breath or chest pain, call your Primary MD immediately. Follow Cardiac Low Salt Diet and 1.5 lit/day fluid restriction.   On your next visit with your primary care physician please Get Medicines reviewed and adjusted.   Please request your Prim.MD to go over all Hospital Tests and Procedure/Radiological results at the follow up, please get all Hospital records sent to your Prim MD by signing hospital release before you go home.   If you experience worsening of your admission symptoms, develop shortness of breath, life  threatening emergency, suicidal or homicidal thoughts you must seek medical attention immediately by calling 911 or calling your MD immediately  if symptoms less severe.  You Must read complete instructions/literature along with all the possible adverse reactions/side effects for all the Medicines you take and that have been prescribed to you. Take any new Medicines after you have completely understood and accpet all the possible adverse reactions/side effects.   Do not drive, operate heavy machinery, perform activities at heights, swimming or participation in water activities or provide baby sitting services if your were admitted for syncope or siezures until you have seen by Primary MD or a Neurologist and advised to do so again.  Do not drive when taking Pain medications.    Do not take more than prescribed Pain, Sleep and Anxiety Medications  Special Instructions: If you have smoked or chewed Tobacco  in the last 2 yrs  please stop smoking, stop any regular Alcohol  and or any Recreational drug use.  Wear Seat belts while driving.   Please note  You were cared for by a hospitalist during your hospital stay. If you have any questions about your discharge medications or the care you received while you were in the hospital after you are discharged, you can call the unit and asked to speak with the hospitalist on call if the hospitalist that took care of you is not available. Once you are discharged, your primary care physician will handle any further medical issues. Please note that NO REFILLS for any discharge medications will be authorized once you are discharged, as it is imperative that you return to your primary care physician (or establish a relationship with a primary care physician if you do not have one) for your aftercare needs so that they can reassess your need for medications and monitor your lab values.   Increase activity slowly    Complete by:  As directed       Discharge  Medications     Medication List    STOP taking these medications   benazepril 40 MG tablet Commonly known as:  LOTENSIN   metFORMIN 500 MG tablet Commonly known as:  GLUCOPHAGE     TAKE these medications   amLODipine 10 MG tablet Commonly known as:  NORVASC Take 1 tablet (10 mg total) by mouth daily.   aspirin 81 MG tablet Take 81 mg by mouth daily.   chlorthalidone 25 MG tablet Commonly known as:  HYGROTON Take 1 tablet (25 mg total) by mouth daily.   cloNIDine 0.1 MG tablet Commonly known as:  CATAPRES Take 1 tablet (0.1 mg total) by mouth daily.   cyclobenzaprine 10 MG tablet Commonly known as:  FLEXERIL Take 1 tablet (10 mg total) by mouth 3 (three) times daily as needed for muscle spasms.   Fish Oil 1000 MG Caps Take 2,000 mg by mouth 2 (two) times daily.   hydrALAZINE 25 MG tablet Commonly known as:  APRESOLINE Take 1 tablet (25 mg total) by mouth every 8 (eight) hours.   ONE TOUCH ULTRA TEST test strip Generic drug:  glucose blood USE TO CHECK BLOOD SUGAR ONCE A DAY AS INSTRUCTED   vitamin E 400 UNIT capsule Take 1 capsule (400 Units total) by mouth daily.       Follow-up Information    Mary-Margaret Hassell Done, FNP. Schedule an appointment as soon as possible for a visit in 2 day(s).   Specialty:  Family Medicine Contact information: [place] [address] [phone]        [facility] S, MD. Schedule an appointment as soon as possible for a visit in 1 week(s).   Specialty:  Nephrology Why:  Renal failure Contact information: [id number] [place] [address] [phone]           Major procedures and Radiology Reports - PLEASE review detailed and final reports thoroughly  -         Ct Abdomen Pelvis Wo Contrast  Result Date: 02/11/2016 CLINICAL DATA:  Loss of appetite and right-sided rib pain. EXAM: CT ABDOMEN AND PELVIS WITHOUT CONTRAST TECHNIQUE: Multidetector CT imaging of the abdomen and  pelvis was performed following the standard protocol without IV contrast. COMPARISON:  None. FINDINGS: The lack of intravenous contrast limits the ability to evaluate solid abdominal organs. Lower chest: Limited visualization of the lower thorax demonstrates minimal dependent subpleural ground-glass atelectasis, right greater than left.  No discrete focal airspace opacities. No pleural effusion. Normal heart size. Extensive coronary artery calcifications. No pericardial effusion. Hepatobiliary: Normal hepatic contour. Scattered punctate hepatic granuloma. Normal noncontrast appearance of the gallbladder given degree distention. No radiopaque gallstones. No ascites. Pancreas: Normal noncontrast appearance of the pancreas. Spleen: Normal noncontrast appearance of the spleen Adrenals/Urinary Tract: No renal stones. Bilateral hypo attenuating renal lesions are incompletely characterized without intravenous contrast though statistically represent renal cysts. There is a minimal amount of grossly symmetric bilateral perinephric stranding, likely age and body habitus related. No urinary obstruction. No renal stones are seen along expected course of either ureter or the urinary bladder. Normal noncontrast appearance of the urinary bladder given degree distention. Normal noncontrast appearance of the bilateral adrenal glands. Stomach/Bowel: Ingested enteric contrast extends to the level of the rectum. Scattered colonic diverticulosis without evidence of diverticulitis. Normal noncontrast appearance of the terminal ileum. The appendix is not visualized compatible with provided surgical history. No pneumoperitoneum, pneumatosis or portal venous gas. Vascular/Lymphatic: Rather extensive calcified atherosclerotic plaque within a normal caliber abdominal aorta. No bulky retroperitoneal, mesenteric, pelvic or inguinal lymphadenopathy on this noncontrast examination. Reproductive: The prostate is enlarged with mass effect on the  undersurface urinary bladder. Vascular calcifications are seen within the prostate. Other: Regional soft tissues appear normal. Musculoskeletal: No acute or aggressive osseous abnormalities with special attention paid to the right-sided ribs. Old / healed fracture involving the anterior aspect of the right tenth rib. Old mild (< 25%) compression deformity involving the superior endplate of the T9 vertebral body with associated prominent anterior disc osteophyte complex involving the T9-T10 intervertebral disc space. IMPRESSION: 1. No explanation for patient's loss of appetite, weight loss and right-sided rib pain. Specifically, no evidence of nephrolithiasis, urinary or enteric obstruction. No displaced right-sided rib fractures. 2. Colonic diverticulosis without evidence of diverticulitis. 3. Coronary artery calcifications. 4. Aortic Atherosclerosis (ICD10-170.0) Electronically Signed   By: Sandi Mariscal M.D.   On: 02/11/2016 15:22   Dg Chest 2 View  Result Date: 02/11/2016 CLINICAL DATA:  Right-sided chest pain, weakness, and anorexia for 3 weeks. EXAM: CHEST  2 VIEW COMPARISON:  10/01/2011 FINDINGS: The heart size remains normal. Mild tortuosity of thoracic aorta again noted. Both lungs are clear. No evidence of pneumothorax or pleural effusion. Right lateral fifth rib fracture deformity is seen which appears subacute to chronic in age. Several old mid thoracic vertebral body compression fractures are again seen. A T7 vertebral body compression fracture is seen which is of indeterminate age but is new since prior exam in 2013. IMPRESSION: No active cardiopulmonary disease. Probable subacute to chronic right lateral fifth rib fracture deformity. T7 vertebral body compression fracture is of indeterminate age, but new since 2013 exam. Electronically Signed   By: Earle Gell M.D.   On: 02/11/2016 11:21   Dg Thoracic Spine 2 View  Result Date: 01/23/2016 CLINICAL DATA:  Back pain. EXAM: THORACIC SPINE 2 VIEWS  COMPARISON:  09/16/2014. FINDINGS: Mediastinum hilar structures normal. Lungs are clear. Small right pleural effusion. No pneumothorax. Heart size normal. Biapical pleural-parenchymal thickening noted consistent with scarring. Old right rib fractures. Thoracic spine compression fractures again noted. Degenerative changes thoracic spine. IMPRESSION: 1. Small right pleural effusion. 2. Cardiomegaly.  No pulmonary venous congestion. Electronically Signed   By: Marcello Moores  Register   On: 01/23/2016 08:45    Micro Results     No results found for this or any previous visit (from the past 240 hour(s)).  Today   Subjective    Richardson Landry  [place] today has no headache,no chest abdominal pain,no new weakness tingling or numbness, feels much better wants to go home today.     Objective   Blood pressure (!) 147/74, pulse 72, temperature 98.3 F (36.8 C), temperature source Oral, resp. rate 18, height 5\' 8"  (1.727 m), weight 71.7 kg (158 lb), SpO2 100 %.   Intake/Output Summary (Last 24 hours) at 02/12/16 0916 Last data filed at 02/12/16 0846  Gross per 24 hour  Intake           1477.5 ml  Output             1000 ml  Net            477.5 ml    Exam Awake Alert, Oriented x 3, No new F.N deficits, Normal affect [place].AT,PERRAL Supple Neck,No JVD, No cervical lymphadenopathy appriciated.  Symmetrical Chest wall movement, Good air movement bilaterally, CTAB RRR,No Gallops,Rubs or new Murmurs, No Parasternal Heave +ve B.Sounds, Abd Soft, Non tender, No organomegaly appriciated, No rebound -guarding or rigidity. No Cyanosis, Clubbing or edema, No new Rash or bruise   Data Review   CBC w Diff:  Lab Results  Component Value Date   WBC 6.3 02/11/2016   HGB 12.0 (L) 02/11/2016   HCT 33.6 (L) 02/11/2016   HCT 30.5 (L) 01/29/2015   PLT 250 02/11/2016   PLT 301 01/29/2015   LYMPHOPCT 9 02/11/2016   MONOPCT 5 02/11/2016   EOSPCT 3 02/11/2016   BASOPCT 0 02/11/2016    CMP:  Lab Results  Component  Value Date   NA 133 (L) 02/12/2016   NA 141 12/27/2015   K 5.3 (H) 02/12/2016   CL 105 02/12/2016   CO2 22 02/12/2016   BUN 69 (H) 02/12/2016   BUN 30 (H) 12/27/2015   CREATININE 1.75 (H) 02/12/2016   CREATININE 1.28 (H) 05/16/2015   PROT 8.0 02/11/2016   PROT 6.8 12/27/2015   ALBUMIN 4.0 02/11/2016   ALBUMIN 4.4 12/27/2015   BILITOT 0.6 02/11/2016   BILITOT 0.3 12/27/2015   ALKPHOS 60 02/11/2016   AST 14 (L) 02/11/2016   ALT 11 (L) 02/11/2016  . CBG (last 3)   Recent Labs  02/11/16 2035 02/12/16 0751  GLUCAP 134* 118*      Total Time in preparing paper work, data evaluation and todays exam - 35 minutes  Thurnell Lose M.D on 02/12/2016 at 9:16 AM  [facility]   Office  [phone]

## 2016-02-12 NOTE — Discharge Instructions (Signed)
Follow with Primary MD Peter York Done, FNP in 1-2 days and requested Renal doctor in 1 week, please follow on serum and urine electrophoresis tests which are pending.  Get CBC, CMP, 2 view Chest X ray checked  by Primary MD 2-3 days ( we routinely change or add medications that can affect your baseline labs and fluid status, therefore we recommend that you get the mentioned basic workup next visit with your PCP, your PCP may decide not to get them or add new tests based on their clinical decision)   Activity: As tolerated with Full fall precautions use walker/cane & assistance as needed   Disposition Home    Diet:    Heart Healthy  Low Carb  For Heart failure patients - Check your Weight same time everyday, if you gain over 2 pounds, or you develop in leg swelling, experience more shortness of breath or chest pain, call your Primary MD immediately. Follow Cardiac Low Salt Diet and 1.5 lit/day fluid restriction.   On your next visit with your primary care physician please Get Medicines reviewed and adjusted.   Please request your Prim.MD to go over all Hospital Tests and Procedure/Radiological results at the follow up, please get all Hospital records sent to your Prim MD by signing hospital release before you go home.   If you experience worsening of your admission symptoms, develop shortness of breath, life threatening emergency, suicidal or homicidal thoughts you must seek medical attention immediately by calling 911 or calling your MD immediately  if symptoms less severe.  You Must read complete instructions/literature along with all the possible adverse reactions/side effects for all the Medicines you take and that have been prescribed to you. Take any new Medicines after you have completely understood and accpet all the possible adverse reactions/side effects.   Do not drive, operate heavy machinery, perform activities at heights, swimming or participation in water activities or  provide baby sitting services if your were admitted for syncope or siezures until you have seen by Primary MD or a Neurologist and advised to do so again.  Do not drive when taking Pain medications.    Do not take more than prescribed Pain, Sleep and Anxiety Medications  Special Instructions: If you have smoked or chewed Tobacco  in the last 2 yrs please stop smoking, stop any regular Alcohol  and or any Recreational drug use.  Wear Seat belts while driving.   Please note  You were cared for by a hospitalist during your hospital stay. If you have any questions about your discharge medications or the care you received while you were in the hospital after you are discharged, you can call the unit and asked to speak with the hospitalist on call if the hospitalist that took care of you is not available. Once you are discharged, your primary care physician will handle any further medical issues. Please note that NO REFILLS for any discharge medications will be authorized once you are discharged, as it is imperative that you return to your primary care physician (or establish a relationship with a primary care physician if you do not have one) for your aftercare needs so that they can reassess your need for medications and monitor your lab values.

## 2016-02-13 ENCOUNTER — Encounter: Payer: Self-pay | Admitting: Nurse Practitioner

## 2016-02-13 ENCOUNTER — Ambulatory Visit (INDEPENDENT_AMBULATORY_CARE_PROVIDER_SITE_OTHER): Payer: PPO

## 2016-02-13 ENCOUNTER — Ambulatory Visit (INDEPENDENT_AMBULATORY_CARE_PROVIDER_SITE_OTHER): Payer: PPO | Admitting: Nurse Practitioner

## 2016-02-13 VITALS — BP 170/92 | HR 69 | Temp 97.0°F | Ht 68.0 in | Wt 150.0 lb

## 2016-02-13 DIAGNOSIS — Z09 Encounter for follow-up examination after completed treatment for conditions other than malignant neoplasm: Secondary | ICD-10-CM

## 2016-02-13 DIAGNOSIS — S22000A Wedge compression fracture of unspecified thoracic vertebra, initial encounter for closed fracture: Secondary | ICD-10-CM | POA: Diagnosis not present

## 2016-02-13 DIAGNOSIS — E119 Type 2 diabetes mellitus without complications: Secondary | ICD-10-CM | POA: Diagnosis not present

## 2016-02-13 DIAGNOSIS — N179 Acute kidney failure, unspecified: Secondary | ICD-10-CM

## 2016-02-13 DIAGNOSIS — I10 Essential (primary) hypertension: Secondary | ICD-10-CM

## 2016-02-13 LAB — OSMOLALITY: OSMOLALITY: 312 mosm/kg — AB (ref 275–295)

## 2016-02-13 LAB — OSMOLALITY, URINE: OSMOLALITY UR: 476 mosm/kg (ref 300–900)

## 2016-02-13 NOTE — Progress Notes (Signed)
   Subjective:    Patient ID: Peter York, male    DOB: 07-Jul-1945, 70 y.o.   MRN: [id number]  HPI Peter York comes in today for hospital follow up. He was seen by me om 01/23/16 with back pain- he had been riding a horse a lot prior to back pain. He was given depo-medrol dose pack as well as flexeril. Over the last several weeks he had developed weakness and fatigue and weight loss. He went to the ER on 02/11/16.  CT was normal- chest xray showed t7 compression fracture and right subacute/chronic rib fractures. He was also dx with dehydration and ARF from taking NSAIDS and lisinopril for blood pressure. He was given fluids and discharged from hospital and was told to stop lisinopril and glucophage. They added hydralzaine to meds.     Review of Systems  Constitutional: Positive for appetite change (still decreased) and fatigue.  HENT: Negative.   Respiratory: Negative.   Cardiovascular: Negative.   Gastrointestinal: Negative.   Musculoskeletal: Positive for back pain and myalgias.  Neurological: Negative.   Psychiatric/Behavioral: Negative.        Objective:   Physical Exam  Constitutional: He is oriented to person, place, and time. He appears well-developed and well-nourished. No distress.  Cardiovascular: Normal rate, regular rhythm and normal heart sounds.   Pulmonary/Chest: Effort normal and breath sounds normal.  Musculoskeletal:  Wearing back brace  Neurological: He is alert and oriented to person, place, and time.  Skin: Skin is warm.  Psychiatric: He has a normal mood and affect. His behavior is normal. Judgment and thought content normal.    BP (!) 170/92 (BP Location: Left Arm, Cuff Size: Normal)   Pulse 69   Temp 97 F (36.1 C) (Oral)   Ht 5\' 8"  (1.727 m)   Wt 150 lb (68 kg)   BMI 22.81 kg/m   Chest xray unchanged from previous-Preliminary reading by Ronnald Collum, FNP  [facility]       Assessment & Plan:  1. Hospital discharge follow-up Records reviewed Decrease  potassiunm in diet - DG Chest 2 View; Future  2. Essential hypertension Increased clonodine .1 from 1 a day to 1 bid  3. Acute renal failure, unspecified acute renal failure type ([place]) Waiting on labs  4. Type 2 diabetes mellitus without complication, without long-term current use of insulin (HCC) Continue to hold glucophage until I get lab results back  5. Thoracic compression fracture - no horse riding for now Will order compression fracture  Follow up in 1 week.  Mary-Margaret Hassell Done, FNP

## 2016-02-13 NOTE — Patient Instructions (Signed)
Hyperkalemia °Introduction °Hyperkalemia is when you have too much potassium in your blood. Potassium is normally removed (excreted) from your body by your kidneys. If there is too much potassium in your blood, it can affect how your heart works. °Follow these instructions at home: °· Take medicines only as told by your doctor. °· Do not take any supplements, natural products, herbs, or vitamins unless your doctor says it is okay. °· Limit your alcohol intake as told by your doctor. °· Stop illegal drug use. If you need help quitting, ask your doctor. °· Keep all follow-up visits as told by your doctor. This is important. °· If you have kidney disease, you may need to follow a low potassium diet. A food specialist (dietitian) can help you. °Contact a doctor if: °· Your heartbeat is not regular or very slow. °· You feel dizzy (light-headed). °· You feel weak. °· You feel sick to your stomach (nauseous). °· You have tingling in your hands or feet. °· You cannot feel your hands or feet. °Get help right away if: °· You are short of breath. °· You have chest pain. °· You pass out (faint). °· You cannot move your muscles. °This information is not intended to replace advice given to you by your health care provider. Make sure you discuss any questions you have with your health care provider. °Document Released: 03/12/2005 Document Revised: 08/18/2015 Document Reviewed: 06/17/2013 °© 2017 [employer] ° °

## 2016-02-14 LAB — CBC WITH DIFFERENTIAL/PLATELET
Basophils Absolute: 0 10*3/uL (ref 0.0–0.2)
Basos: 0 %
EOS (ABSOLUTE): 0.2 10*3/uL (ref 0.0–0.4)
EOS: 4 %
HEMATOCRIT: 30.2 % — AB (ref 37.5–51.0)
Hemoglobin: 10.6 g/dL — ABNORMAL LOW (ref 12.6–17.7)
IMMATURE GRANULOCYTES: 0 %
Immature Grans (Abs): 0 10*3/uL (ref 0.0–0.1)
LYMPHS ABS: 0.6 10*3/uL — AB (ref 0.7–3.1)
Lymphs: 12 %
MCH: 31.2 pg (ref 26.6–33.0)
MCHC: 35.1 g/dL (ref 31.5–35.7)
MCV: 89 fL (ref 79–97)
MONOS ABS: 0.5 10*3/uL (ref 0.1–0.9)
Monocytes: 9 %
NEUTROS PCT: 75 %
Neutrophils Absolute: 3.8 10*3/uL (ref 1.4–7.0)
PLATELETS: 261 10*3/uL (ref 150–379)
RBC: 3.4 x10E6/uL — AB (ref 4.14–5.80)
RDW: 12.9 % (ref 12.3–15.4)
WBC: 5.1 10*3/uL (ref 3.4–10.8)

## 2016-02-14 LAB — PROTEIN ELECTROPHORESIS, SERUM
A/G Ratio: 1.1 (ref 0.7–1.7)
ALPHA-1-GLOBULIN: 0.2 g/dL (ref 0.0–0.4)
Albumin ELP: 3.5 g/dL (ref 2.9–4.4)
Alpha-2-Globulin: 0.7 g/dL (ref 0.4–1.0)
Beta Globulin: 1 g/dL (ref 0.7–1.3)
GAMMA GLOBULIN: 1.2 g/dL (ref 0.4–1.8)
GLOBULIN, TOTAL: 3.1 g/dL (ref 2.2–3.9)
TOTAL PROTEIN ELP: 6.6 g/dL (ref 6.0–8.5)

## 2016-02-14 LAB — CMP14+EGFR
A/G RATIO: 1.4 (ref 1.2–2.2)
ALBUMIN: 4.3 g/dL (ref 3.5–4.8)
ALK PHOS: 76 IU/L (ref 39–117)
ALT: 9 IU/L (ref 0–44)
AST: 14 IU/L (ref 0–40)
BILIRUBIN TOTAL: 0.3 mg/dL (ref 0.0–1.2)
BUN / CREAT RATIO: 26 — AB (ref 10–24)
BUN: 50 mg/dL — ABNORMAL HIGH (ref 8–27)
CHLORIDE: 97 mmol/L (ref 96–106)
CO2: 22 mmol/L (ref 18–29)
Calcium: 9.4 mg/dL (ref 8.6–10.2)
Creatinine, Ser: 1.91 mg/dL — ABNORMAL HIGH (ref 0.76–1.27)
GFR calc Af Amer: 40 mL/min/{1.73_m2} — ABNORMAL LOW (ref >59)
GFR calc non Af Amer: 35 mL/min/{1.73_m2} — ABNORMAL LOW (ref >59)
GLOBULIN, TOTAL: 3.1 g/dL (ref 1.5–4.5)
Glucose: 136 mg/dL — ABNORMAL HIGH (ref 65–99)
POTASSIUM: 5.2 mmol/L (ref 3.5–5.2)
SODIUM: 136 mmol/L (ref 134–144)
Total Protein: 7.4 g/dL (ref 6.0–8.5)

## 2016-02-14 LAB — IMMUNOFIXATION ELECTROPHORESIS
IGA: 290 mg/dL (ref 61–437)
IGG (IMMUNOGLOBIN G), SERUM: 1181 mg/dL (ref 700–1600)
IGM, SERUM: 82 mg/dL (ref 20–172)
TOTAL PROTEIN ELP: 6.7 g/dL (ref 6.0–8.5)

## 2016-02-29 ENCOUNTER — Other Ambulatory Visit: Payer: PPO

## 2016-02-29 DIAGNOSIS — N179 Acute kidney failure, unspecified: Secondary | ICD-10-CM | POA: Diagnosis not present

## 2016-03-01 ENCOUNTER — Ambulatory Visit (INDEPENDENT_AMBULATORY_CARE_PROVIDER_SITE_OTHER): Payer: PPO | Admitting: Nurse Practitioner

## 2016-03-01 ENCOUNTER — Other Ambulatory Visit: Payer: Self-pay | Admitting: Nurse Practitioner

## 2016-03-01 ENCOUNTER — Ambulatory Visit (INDEPENDENT_AMBULATORY_CARE_PROVIDER_SITE_OTHER): Payer: PPO

## 2016-03-01 DIAGNOSIS — E119 Type 2 diabetes mellitus without complications: Secondary | ICD-10-CM

## 2016-03-01 DIAGNOSIS — I10 Essential (primary) hypertension: Secondary | ICD-10-CM

## 2016-03-01 LAB — CMP14+EGFR
ALBUMIN: 4.1 g/dL (ref 3.5–4.8)
ALK PHOS: 120 IU/L — AB (ref 39–117)
ALT: 39 IU/L (ref 0–44)
AST: 14 IU/L (ref 0–40)
Albumin/Globulin Ratio: 1.3 (ref 1.2–2.2)
BILIRUBIN TOTAL: 0.4 mg/dL (ref 0.0–1.2)
BUN / CREAT RATIO: 23 (ref 10–24)
BUN: 42 mg/dL — AB (ref 8–27)
CO2: 24 mmol/L (ref 18–29)
CREATININE: 1.85 mg/dL — AB (ref 0.76–1.27)
Calcium: 9.5 mg/dL (ref 8.6–10.2)
Chloride: 99 mmol/L (ref 96–106)
GFR calc non Af Amer: 36 mL/min/{1.73_m2} — ABNORMAL LOW (ref >59)
GFR, EST AFRICAN AMERICAN: 42 mL/min/{1.73_m2} — AB (ref >59)
GLOBULIN, TOTAL: 3.1 g/dL (ref 1.5–4.5)
GLUCOSE: 199 mg/dL — AB (ref 65–99)
Potassium: 5.2 mmol/L (ref 3.5–5.2)
SODIUM: 138 mmol/L (ref 134–144)
TOTAL PROTEIN: 7.2 g/dL (ref 6.0–8.5)

## 2016-03-01 MED ORDER — BENAZEPRIL HCL 40 MG PO TABS: 40.0000 mg | ORAL_TABLET | Freq: Every day | ORAL | 1 refills | Status: DC

## 2016-03-01 MED ORDER — METFORMIN HCL 500 MG PO TABS: ORAL_TABLET | ORAL | 1 refills | Status: DC

## 2016-03-28 ENCOUNTER — Ambulatory Visit: Payer: PPO | Admitting: Nurse Practitioner

## 2016-04-10 ENCOUNTER — Ambulatory Visit (INDEPENDENT_AMBULATORY_CARE_PROVIDER_SITE_OTHER): Payer: PPO | Admitting: Pharmacist

## 2016-04-10 DIAGNOSIS — S22000D Wedge compression fracture of unspecified thoracic vertebra, subsequent encounter for fracture with routine healing: Secondary | ICD-10-CM

## 2016-04-10 DIAGNOSIS — M810 Age-related osteoporosis without current pathological fracture: Secondary | ICD-10-CM | POA: Diagnosis not present

## 2016-04-10 LAB — BAYER DCA HB A1C WAIVED: HB A1C (BAYER DCA - WAIVED): 6.4 % (ref <7.0)

## 2016-04-10 MED ORDER — CALCIUM CARBONATE-VITAMIN D 500-200 MG-UNIT PO TABS: 1.0000 | ORAL_TABLET | Freq: Every day | ORAL | Status: DC

## 2016-04-10 NOTE — Progress Notes (Signed)
Patient ID: Peter York, male   DOB: [birth year], 71 y.o.   MRN: [id number]      HPI: Patient is here today to discuss results of recent DEXA.  He states he has had a DEXA several years ago but x-ray was unable to locate results in database.  Mr. Baade reports that he took alendronate for about 2 or 3 years and tolerated well.   There 3 xrays which note presence of old thoracic fracture (10/2014, 12/27/2015 and 02/11/2016).  Patient is unsure when this may have occurred.  He rides horses and could have been related to fall or incident with horse.                                                              PMH: No history of androgen depleting therapies Steroid Use?  No Thyroid med?  No History of cancer?  No History of digestive disorders (ie Crohn's)?  No Current or previous eating disorders?  No Last Vitamin D Result:  needed Last GFR Result:  36 ml/min (02/29/2016)   FH/SH: Family history of osteoporosis?  No Parent with history of hip fracture?  No Family history of breast cancer?  No Exercise?  Yes Smoking?  No Alcohol?  No    Calcium Assessment Calcium Intake  # of servings/day  Calcium mg  Milk (8 oz) 0.5  x  300  = 150mg   Yogurt (4 oz) 0 x  200 = 0  Cheese (1 oz) 1 x  200 = 200mg   Other Calcium sources   250mg   Ca supplement 0 = 0   Estimated calcium intake per day 600mg     DEXA Results Date of Test T-Score for AP Spine L1-L4 T-Score for Neck of Left Hip  03/01/2016 -2.1 -2.3               FRAX 10 year estimate: Total FX risk:  25%  (consider medication if >/= 20%) Hip FX risk:  11%  (consider medication if >/= 3%)  Assessment: Osteoporosis - though T-Score in osteopenia range, howevee has history of thoracic fracture and high FRAX estimate  Recommendations: 1.   Discussed BMD  / DEXA results and discussed fracture risk.  Discussed treatment options of restarting bisphosphonate or Forteo.  Labs drawn today to help guide treatment decision 2.  recommend  calcium 1200mg  daily through supplementation or diet.  3.  recommend weight bearing exercise - 30 minutes at least 4 days per week.   4.  Counseled and educated about fall risk and prevention.  Recheck DEXA:  2 years (if Forteo started will check after 1 year of treatment)  Time spent counseling patient:  30 minutes

## 2016-04-10 NOTE — Patient Instructions (Signed)
Fall Prevention in the Home Falls can cause injuries and can affect people from all age groups. There are many simple things that you can do to make your home safe and to help prevent falls. What can I do on the outside of my home?  Regularly repair the edges of walkways and driveways and fix any cracks.  Remove high doorway thresholds.  Trim any shrubbery on the main path into your home.  Use bright outdoor lighting.  Clear walkways of debris and clutter, including tools and rocks.  Regularly check that handrails are securely fastened and in good repair. Both sides of any steps should have handrails.  Install guardrails along the edges of any raised decks or porches.  Have leaves, snow, and ice cleared regularly.  Use sand or salt on walkways during winter months.  In the garage, clean up any spills right away, including grease or oil spills. What can I do in the bathroom?  Use night lights.  Install grab bars by the toilet and in the tub and shower. Do not use towel bars as grab bars.  Use non-skid mats or decals on the floor of the tub or shower.  If you need to sit down while you are in the shower, use a plastic, non-slip stool.  Keep the floor dry. Immediately clean up any water that spills on the floor.  Remove soap buildup in the tub or shower on a regular basis.  Attach bath mats securely with double-sided non-slip rug tape.  Remove throw rugs and other tripping hazards from the floor. What can I do in the bedroom?  Use night lights.  Make sure that a bedside light is easy to reach.  Do not use oversized bedding that drapes onto the floor.  Have a firm chair that has side arms to use for getting dressed.  Remove throw rugs and other tripping hazards from the floor. What can I do in the kitchen?  Clean up any spills right away.  Avoid walking on wet floors.  Place frequently used items in easy-to-reach places.  If you need to reach for something above  you, use a sturdy step stool that has a grab bar.  Keep electrical cables out of the way.  Do not use floor polish or wax that makes floors slippery. If you have to use wax, make sure that it is non-skid floor wax.  Remove throw rugs and other tripping hazards from the floor. What can I do in the stairways?  Do not leave any items on the stairs.  Make sure that there are handrails on both sides of the stairs. Fix handrails that are broken or loose. Make sure that handrails are as long as the stairways.  Check any carpeting to make sure that it is firmly attached to the stairs. Fix any carpet that is loose or worn.  Avoid having throw rugs at the top or bottom of stairways, or secure the rugs with carpet tape to prevent them from moving.  Make sure that you have a light switch at the top of the stairs and the bottom of the stairs. If you do not have them, have them installed. What are some other fall prevention tips?  Wear closed-toe shoes that fit well and support your feet. Wear shoes that have rubber soles or low heels.  When you use a stepladder, make sure that it is completely opened and that the sides are firmly locked. Have someone hold the ladder while you are using  it. Do not climb a closed stepladder.  Add color or contrast paint or tape to grab bars and handrails in your home. Place contrasting color strips on the first and last steps.  Use mobility aids as needed, such as canes, walkers, scooters, and crutches.  Turn on lights if it is dark. Replace any light bulbs that burn out.  Set up furniture so that there are clear paths. Keep the furniture in the same spot.  Fix any uneven floor surfaces.  Choose a carpet design that does not hide the edge of steps of a stairway.  Be aware of any and all pets.  Review your medicines with your healthcare provider. Some medicines can cause dizziness or changes in blood pressure, which increase your risk of falling. Talk with  your health care provider about other ways that you can decrease your risk of falls. This may include working with a physical therapist or trainer to improve your strength, balance, and endurance. This information is not intended to replace advice given to you by your health care provider. Make sure you discuss any questions you have with your health care provider. Document Released: 03/02/2002 Document Revised: 08/09/2015 Document Reviewed: 04/16/2014 Elsevier Interactive Patient Education  2017 [place].               Exercise for Strong Bones  Exercise is important to build and maintain strong bones / bone density.  There are 2 types of exercises that are important to building and maintaining strong bones:  Weight- bearing and muscle-stregthening.  Weight-bearing Exercises  These exercises include activities that make you move against gravity while staying upright. Weight-bearing exercises can be high-impact or low-impact.  High-impact weight-bearing exercises help build bones and keep them strong. If you have broken a bone due to osteoporosis or are at risk of breaking a bone, you may need to avoid high-impact exercises. If you're not sure, you should check with your healthcare provider.  Examples of high-impact weight-bearing exercises are: Dancing  Doing high-impact aerobics  Hiking  Jogging/running  Jumping Rope  Stair climbing  Tennis  Low-impact weight-bearing exercises can also help keep bones strong and are a safe alternative if you cannot do high-impact exercises.   Examples of low-impact weight-bearing exercises are: Using elliptical training machines  Doing low-impact aerobics  Using stair-step machines  Fast walking on a treadmill or outside   Muscle-Strengthening Exercises These exercises include activities where you move your body, a weight or some other resistance against gravity. They are also known as resistance exercises and include: Lifting weights   Using elastic exercise bands  Using weight machines  Lifting your own body weight  Functional movements, such as standing and rising up on your toes  Yoga and Pilates can also improve strength, balance and flexibility. However, certain positions may not be safe for people with osteoporosis or those at increased risk of broken bones. For example, exercises that have you bend forward may increase the chance of breaking a bone in the spine.   Non-Impact Exercises There are other types of exercises that can help prevent falls.  Non-impact exercises can help you to improve balance, posture and how well you move in everyday activities. Some of these exercises include: Balance exercises that strengthen your legs and test your balance, such as Tai Chi, can decrease your risk of falls.  Posture exercises that improve your posture and reduce rounded or "sloping" shoulders can help you decrease the chance of breaking a bone, especially in  the spine.  Functional exercises that improve how well you move can help you with everyday activities and decrease your chance of falling and breaking a bone. For example, if you have trouble getting up from a chair or climbing stairs, you should do these activities as exercises.   **A physical therapist can teach you balance, posture and functional exercises. He/she can also help you learn which exercises are safe and appropriate for you.  [facility] has a physical therapy office in [place] in front of our office and referrals can be made for assessments and treatment as needed and strength and balance training.  If you would like to have an assessment with [facility] and our physical therapy team please let a nurse or provider know.     Calcium & Vitamin D: The Facts  Why is calcium and vitamin D consumption important? Calcium: . Most Americans do not consume adequate amounts of calcium! Calcium is required for proper muscle function, nerve communication, bone support,  and many other functions in the body.  . The body uses bones as a source of calcium. Bones 'remodel' themselves continuously - the body constantly breaks bone down to release calcium and rebuilds bones by replacing calcium in the bone later.  . As we get older, the rate of bone breakdown occurs faster than bone rebuilding which could lead to osteopenia, osteoporosis, and possible fractures.   Vitamin D: . People naturally make vitamin D in the body when sunlight hits the skin and triggers a process that leads to vitamin D production. This natural vitamin D production requires about 10-15 minutes of sun exposure on the hands, arms, and face at least 2-3 times per week. However, due to decreased sun exposure and the use of sunscreen, most people will need to get additional vitamin D from foods or supplements. Your doctor can measure your body's vitamin D level through a simple blood test to determine your daily vitamin D needs.  . Vitamin D is used to help the body absorb calcium, maintain bone health, help the immune system, and reduce inflammation. It also plays a role in muscle performance, balance and risk of falling.  . Vitamin D deficiency can lead to osteomalacia or softening of the bones, bone pain, and muscle weakness.   The recommended daily allowance of Calcium and Vitamin D varies for different age groups. Age group Calcium (mg) Vitamin D (IU)  Females and Males: Age 72-50 1000 mg 600 IU  Females: Age 72- 72 1200 mg 600 IU  Males: Age 69-70 1000 mg 600 IU  Females and Males: Age 69+ 1200 mg 800 IU  Pregnant/lactating Females age 87-50 1000 mg 600 IU   How much Calcium do you get in your diet? Calcium Intake # of servings per day  Total calcium (mg)  Skim milk, 2% milk (1 cup) _________ x 300 mg   Yogurt (1 small container) _________ x 200 mg   Cheese (1oz) _________ x 200 mg   Cottage Cheese (1 cup)             ________ x 150 mg   Almond milk (1 cup) _________ x 450 mg   Fortified  Orange Juice (1 cup) _________ x 300 mg   Broccoli or spinach ( 1 cup) _________ x 100 mg   Salmon (3 oz) _________ x 150 mg    Almonds (1/4 cup) _______ x 90 mg      How do we get Calcium and Vitamin D in our diet?  Calcium: . Obtaining calcium from the diet is the most preferred way to reach the recommended daily goal. If this goal is not reached through diet, calcium supplements are available.  . Calcium is found in many foods including: dairy products, dark leafy vegetables (like broccoli, kale, and spinach), fish, and fortified products like juices and cereals.  . The food label will have a %DV (percent daily value) listed showing the amount of calcium per serving. To determine the total mg per serving, simply replace the % with zero (0).  For example, Almond Breeze almond milk contains 45% DV of calcium or '450mg'$  per 1 cup.  . You can increase the amount of calcium in your diet by using more calcium products in your daily meals. Use yogurt and fruit to make smoothies or use yogurt to top baked potatoes or make whipped potatoes. Sprinkle low fat cheese onto salads or into egg white omelets. You can even add non-fat dry milk powder ('300mg'$  calcium per 1/3 cup) to hot cereals, meat loaf, soups, or potatoes.  . Calcium supplements come in many forms including tablets, chewables, and gummies. Be sure to read the label to determine the correct number of tablets per serving and whether or not to take the supplement with food.  . Calcium carbonate products (Oscal, Caltrate, and Viactiv) are generally better absorbed when taken with food while calcium citrate products like Citracal can be taken with or without food.  . The body can only absorb about 600 mg of calcium at one time. It is recommended to take calcium supplements in small amounts several times per day.  However, taking it all at once is better than not taking it at all. . Increasing your intake of calcium is essential for bone health, but may  also lead to some side effects like constipation, increased gas, bloating or abdominal cramping. To help reduce these side effects, start with 1 tablet per day and slowly increase your intake of the supplement to the recommended doses. It is also recommended that you drink plenty of water each day. Vitamin D: . Very few foods naturally contain vitamin D. However, it is found in saltwater fish (like tuna, salmon and mackerel), beef liver, egg yolks, cheese and vitamin D fortified foods (like yogurt, cereals, orange juice and milk) . The amount of vitamin D in each food or product is listed as %DV on the product label. To determine the total amount of vitamin D per serving, drop the % sign and multiply the number by 4. For example, 1 cup of Almond Breeze almond milk contains 25% DV vitamin D or 100 IU per serving (25 x 4 =100). . Vitamin D is also found in multivitamins and supplements and may be listed as ergocalciferol (vitamin D2) or cholecalciferol (vitamin D3). Each of these forms of vitamin D are equivalent and the daily recommended intake will vary based on your age and the vitamin D levels in your body. Follow your doctor's recommendation for vitamin D intake.

## 2016-04-11 ENCOUNTER — Encounter: Payer: Self-pay | Admitting: Pharmacist

## 2016-04-11 DIAGNOSIS — M810 Age-related osteoporosis without current pathological fracture: Secondary | ICD-10-CM

## 2016-04-11 DIAGNOSIS — S22000A Wedge compression fracture of unspecified thoracic vertebra, initial encounter for closed fracture: Secondary | ICD-10-CM | POA: Insufficient documentation

## 2016-04-11 HISTORY — DX: Age-related osteoporosis without current pathological fracture: M81.0

## 2016-04-12 ENCOUNTER — Other Ambulatory Visit: Payer: Self-pay | Admitting: Pharmacist

## 2016-04-12 MED ORDER — VITAMIN D 1000 UNITS PO TABS: 1000.0000 [IU] | ORAL_TABLET | Freq: Every day | ORAL | Status: DC

## 2016-04-12 MED ORDER — RISEDRONATE SODIUM 150 MG PO TABS: 150.0000 mg | ORAL_TABLET | ORAL | 3 refills | Status: DC

## 2016-04-13 LAB — CMP14+EGFR
ALBUMIN: 4.4 g/dL (ref 3.5–4.8)
ALK PHOS: 69 IU/L (ref 39–117)
ALT: 11 IU/L (ref 0–44)
AST: 16 IU/L (ref 0–40)
Albumin/Globulin Ratio: 1.4 (ref 1.2–2.2)
BUN / CREAT RATIO: 19 (ref 10–24)
BUN: 31 mg/dL — AB (ref 8–27)
Bilirubin Total: 0.4 mg/dL (ref 0.0–1.2)
CO2: 18 mmol/L (ref 18–29)
CREATININE: 1.67 mg/dL — AB (ref 0.76–1.27)
Calcium: 9.2 mg/dL (ref 8.6–10.2)
Chloride: 104 mmol/L (ref 96–106)
GFR calc Af Amer: 47 mL/min/{1.73_m2} — ABNORMAL LOW (ref >59)
GFR calc non Af Amer: 41 mL/min/{1.73_m2} — ABNORMAL LOW (ref >59)
GLUCOSE: 135 mg/dL — AB (ref 65–99)
Globulin, Total: 3.1 g/dL (ref 1.5–4.5)
Potassium: 5.3 mmol/L — ABNORMAL HIGH (ref 3.5–5.2)
Sodium: 140 mmol/L (ref 134–144)
Total Protein: 7.5 g/dL (ref 6.0–8.5)

## 2016-04-13 LAB — THYROID PANEL WITH TSH
FREE THYROXINE INDEX: 1.7 (ref 1.2–4.9)
T3 UPTAKE RATIO: 29 % (ref 24–39)
T4 TOTAL: 5.9 ug/dL (ref 4.5–12.0)
TSH: 2.24 u[IU]/mL (ref 0.450–4.500)

## 2016-04-13 LAB — VITAMIN D 25 HYDROXY (VIT D DEFICIENCY, FRACTURES): VIT D 25 HYDROXY: 27.8 ng/mL — AB (ref 30.0–100.0)

## 2016-04-13 LAB — TESTOSTERONE,FREE AND TOTAL
Testosterone, Free: 4 pg/mL — ABNORMAL LOW (ref 6.6–18.1)
Testosterone: 335 ng/dL (ref 264–916)

## 2016-04-13 LAB — PHOSPHORUS: Phosphorus: 3.5 mg/dL (ref 2.5–4.5)

## 2016-06-25 ENCOUNTER — Encounter: Payer: Self-pay | Admitting: Cardiology

## 2016-07-10 ENCOUNTER — Ambulatory Visit (INDEPENDENT_AMBULATORY_CARE_PROVIDER_SITE_OTHER): Payer: PPO | Admitting: Cardiology

## 2016-07-10 ENCOUNTER — Encounter: Payer: Self-pay | Admitting: Cardiology

## 2016-07-10 VITALS — BP 130/80 | HR 52 | Ht 68.0 in | Wt 152.8 lb

## 2016-07-10 DIAGNOSIS — I10 Essential (primary) hypertension: Secondary | ICD-10-CM

## 2016-07-10 DIAGNOSIS — E785 Hyperlipidemia, unspecified: Secondary | ICD-10-CM | POA: Diagnosis not present

## 2016-07-10 DIAGNOSIS — E119 Type 2 diabetes mellitus without complications: Secondary | ICD-10-CM

## 2016-07-10 NOTE — Patient Instructions (Signed)

## 2016-07-10 NOTE — Progress Notes (Signed)
Cardiology Office Note    Date:  07/10/2016   ID:  MARIO CORONADO, DOB Aug 11, 1945, MRN [id number]  PCP:  Chevis Pretty, FNP  Cardiologist:  Fransico Him, MD   Chief Complaint  Patient presents with  . Essential Hypertension    History of Present Illness:  Peter York is a 71 y.o. male who presents for  followup of HTN. He has a history of HTN, DM and dyslipidemia. He is here for followup and is doing well.  He tolerates his meds without any problems. He has denies any chest pain, SOB, DOE, PND, orthopnea, LE edema, dizziness, palpitations or syncope.   He says that he stays active outside.     Past Medical History:  Diagnosis Date  . Adenomatous colon polyp 12/31/2005  . Diabetes mellitus   . Hypercholesterolemia   . Hypertension     Past Surgical History:  Procedure Laterality Date  . ANAL FISTULECTOMY  10/02/2011   Procedure: FISTULECTOMY ANAL;  Surgeon: Joyice Faster. Cornett, MD;  Location: [facility];  Service: General;  Laterality: N/A;  excision perianal mass   . APPENDECTOMY    . COLONOSCOPY W/ POLYPECTOMY  12/31/2005   Dr. Silvano Rusk  . UMBILICAL HERNIA REPAIR      Current Medications: Current Meds  Medication Sig  . amLODipine (NORVASC) 10 MG tablet Take 1 tablet (10 mg total) by mouth daily.  [name] aspirin 81 MG tablet Take 81 mg by mouth daily.  . benazepril (LOTENSIN) 40 MG tablet Take 1 tablet (40 mg total) by mouth daily.  . calcium-vitamin D (OSCAL WITH D) 500-200 MG-UNIT tablet Take 1 tablet by mouth daily.  . chlorthalidone (HYGROTON) 25 MG tablet Take 1 tablet (25 mg total) by mouth daily.  . cholecalciferol (VITAMIN D) 1000 units tablet Take 1 tablet (1,000 Units total) by mouth daily.  . cloNIDine (CATAPRES) 0.1 MG tablet Take 1 tablet (0.1 mg total) by mouth daily.  . metFORMIN (GLUCOPHAGE) 500 MG tablet Take 500 mg by mouth every morning. Take 1000mg  (2 tablets) by mouth every night  . Omega-3 Fatty Acids (FISH OIL) 1000 MG  CAPS Take 2,000 mg by mouth 2 (two) times daily.  . ONE TOUCH ULTRA TEST test strip USE TO CHECK BLOOD SUGAR ONCE A DAY AS INSTRUCTED  . risedronate (ACTONEL) 150 MG tablet Take 1 tablet (150 mg total) by mouth every 30 (thirty) days. with water on empty stomach, nothing by mouth or lie down for next 60 minutes.  . vitamin E 400 UNIT capsule Take 1 capsule (400 Units total) by mouth daily.    Allergies:   Crestor [rosuvastatin calcium]   Social History   Social History  . Marital status: Married    Spouse name: N/A  . Number of children: 3  . Years of education: N/A   Occupational History  . retired    Social History Main Topics  . Smoking status: Former Smoker    Quit date: 03/26/1978  . Smokeless tobacco: Never Used  . Alcohol use Yes     Comment: socially  . Drug use: No  . Sexual activity: Not Asked   Other Topics Concern  . None   Social History Narrative  . None     Family History:  The patient's family history includes Cancer in his father; Colon cancer (age of onset: 16) in his father; Colon polyps in his father; Diabetes in his daughter; Heart attack in his maternal uncle; Heart disease in his maternal  uncle, maternal uncle, and maternal uncle.   ROS:   Please see the history of present illness.    ROS All other systems reviewed and are negative.  No flowsheet data found.     PHYSICAL EXAM:   VS:  BP 130/80   Pulse (!) 52   Ht 5\' 8"  (1.727 m)   Wt 152 lb 12.8 oz (69.3 kg)   BMI 23.23 kg/m    GEN: Well nourished, well developed, in no acute distress  HEENT: normal  Neck: no JVD, carotid bruits, or masses Cardiac: RRR; no murmurs, rubs, or gallops,no edema.  Intact distal pulses bilaterally.  Respiratory:  clear to auscultation bilaterally, normal work of breathing GI: soft, nontender, nondistended, + BS MS: no deformity or atrophy  Skin: warm and dry, no rash Neuro:  Alert and Oriented x 3, Strength and sensation are intact Psych: euthymic mood,  full affect  Wt Readings from Last 3 Encounters:  07/10/16 152 lb 12.8 oz (69.3 kg)  02/13/16 150 lb (68 kg)  02/11/16 158 lb (71.7 kg)      Studies/Labs Reviewed:   EKG:  EKG is not ordered today.    Recent Labs: 02/11/2016: Hemoglobin 12.0 02/13/2016: Platelets 261 04/10/2016: ALT 11; BUN 31; Creatinine, Ser 1.67; Potassium 5.3; Sodium 140; TSH 2.240   Lipid Panel    Component Value Date/Time   CHOL 242 (H) 12/27/2015 1128   CHOL 165 10/02/2012 0834   TRIG 146 12/27/2015 1128   TRIG 115 03/09/2014 0818   TRIG 95 10/02/2012 0834   HDL 34 (L) 12/27/2015 1128   HDL 43 03/09/2014 0818   HDL 51 10/02/2012 0834   CHOLHDL 7.1 (H) 12/27/2015 1128   LDLCALC 179 (H) 12/27/2015 1128   LDLCALC 223 (H) 09/10/2013 0905   LDLCALC 95 10/02/2012 0834    Additional studies/ records that were reviewed today include:  none    ASSESSMENT:    1. Essential hypertension   2. Hyperlipidemia with target LDL less than 100   3. Type 2 diabetes mellitus without complication, without long-term current use of insulin (HCC)      PLAN:  In order of problems listed above:  1. HTN - BP controlled on current meds.  He will continue on amlodipine, ARB, diuretic, clonidine.  His last BMET was normal 03/2016. 2. Hyperlipidemia with LDL goal < 100.  This is followed by his PCP and he is getting blood work next month. 3. Type 2 DM with no complications. This is followed by his PCP.      Medication Adjustments/Labs and Tests Ordered: Current medicines are reviewed at length with the patient today.  Concerns regarding medicines are outlined above.  Medication changes, Labs and Tests ordered today are listed in the Patient Instructions below.  There are no Patient Instructions on file for this visit.   Signed, Fransico Him, MD  07/10/2016 9:42 AM    [address], [place], [place]  [id number] Phone: [phone]; Fax: [phone]

## 2016-07-19 ENCOUNTER — Ambulatory Visit (INDEPENDENT_AMBULATORY_CARE_PROVIDER_SITE_OTHER): Payer: PPO | Admitting: *Deleted

## 2016-07-19 VITALS — BP 114/69 | HR 67 | Temp 98.1°F | Ht 68.0 in | Wt 153.0 lb

## 2016-07-19 DIAGNOSIS — Z0000 Encounter for general adult medical examination without abnormal findings: Secondary | ICD-10-CM

## 2016-07-19 NOTE — Patient Instructions (Signed)
  Peter York , Thank you for taking time to come for your Medicare Wellness Visit. I appreciate your ongoing commitment to your health goals. Please review the following plan we discussed and let me know if I can assist you in the future.   These are the goals we discussed: Goals    . Blood Pressure < 130/80    . Eat more fruits and vegetables       This is a list of the screening recommended for you and due dates:  Health Maintenance  Topic Date Due  . Complete foot exam   03/14/2016  . Hemoglobin A1C  07/09/2016  . Flu Shot  12/26/2021*  . Eye exam for diabetics  12/12/2016  . Colon Cancer Screening  12/19/2017  . Tetanus Vaccine  01/28/2025  .  Hepatitis C: One time screening is recommended by Center for Disease Control  (CDC) for  adults born from [birth year] through 1965.   Completed  . Pneumonia vaccines  Completed  *Topic was postponed. The date shown is not the original due date.

## 2016-07-19 NOTE — Progress Notes (Signed)
Subjective:   Peter York is a 71 y.o. male who presents for an Initial Medicare Annual Wellness Visit.  Pt is in good health and very pleasant. He retired from [employer] and he does not have andy breathing problems. He rides horses, plays golf and trains bird dogs. He gets plenty of exercise. BMI 23. He lives at home with his wife and one grown daughter. He has two other children. He keeps busy with his 6 horses and 7 dogs. He states his health is the same as it was last year.      Objective:    Today's Vitals   07/19/16 1408  BP: 114/69  Pulse: 67  Temp: 98.1 F (36.7 C)  TempSrc: Oral  Weight: 153 lb (69.4 kg)  Height: 5\' 8"  (1.727 m)   Body mass index is 23.26 kg/m.  Current Medications (verified) Outpatient Encounter Prescriptions as of 07/19/2016  Medication Sig  . amLODipine (NORVASC) 10 MG tablet Take 1 tablet (10 mg total) by mouth daily.  [name] aspirin 81 MG tablet Take 81 mg by mouth daily.  . benazepril (LOTENSIN) 40 MG tablet Take 1 tablet (40 mg total) by mouth daily.  . chlorthalidone (HYGROTON) 25 MG tablet Take 1 tablet (25 mg total) by mouth daily.  . cholecalciferol (VITAMIN D) 1000 units tablet Take 1 tablet (1,000 Units total) by mouth daily.  . cloNIDine (CATAPRES) 0.1 MG tablet Take 1 tablet (0.1 mg total) by mouth daily.  . metFORMIN (GLUCOPHAGE) 500 MG tablet Take 500 mg by mouth every morning. Take 1000mg  (2 tablets) by mouth every night  . Omega-3 Fatty Acids (FISH OIL) 1000 MG CAPS Take 2,000 mg by mouth 2 (two) times daily.  . ONE TOUCH ULTRA TEST test strip USE TO CHECK BLOOD SUGAR ONCE A DAY AS INSTRUCTED  . risedronate (ACTONEL) 150 MG tablet Take 1 tablet (150 mg total) by mouth every 30 (thirty) days. with water on empty stomach, nothing by mouth or lie down for next 60 minutes.  . vitamin E 400 UNIT capsule Take 1 capsule (400 Units total) by mouth daily.  . [DISCONTINUED] calcium-vitamin D (OSCAL WITH D) 500-200 MG-UNIT tablet  Take 1 tablet by mouth daily.   No facility-administered encounter medications on file as of 07/19/2016.     Allergies (verified) Crestor [rosuvastatin calcium]   History: Past Medical History:  Diagnosis Date  . Adenomatous colon polyp 12/31/2005  . Diabetes mellitus   . Hypercholesterolemia   . Hypertension    Past Surgical History:  Procedure Laterality Date  . ANAL FISTULECTOMY  10/02/2011   Procedure: FISTULECTOMY ANAL;  Surgeon: Joyice Faster. Cornett, MD;  Location: [facility];  Service: General;  Laterality: N/A;  excision perianal mass   . APPENDECTOMY    . COLONOSCOPY W/ POLYPECTOMY  12/31/2005   Dr. Silvano Rusk  . UMBILICAL HERNIA REPAIR     Family History  Problem Relation Age of Onset  . Colon cancer Father 43  . Colon polyps Father   . Cancer Father     Colon  . Heart attack Maternal Uncle     X 4  . Heart disease Maternal Uncle   . Heart disease Maternal Uncle   . Heart disease Maternal Uncle   . Diabetes Daughter    Social History   Occupational History  . retired    Social History Main Topics  . Smoking status: Former Smoker    Quit date: 03/26/1978  . Smokeless tobacco:  Never Used  . Alcohol use Yes     Comment: socially  . Drug use: No  . Sexual activity: Not on file   Tobacco Counseling Counseling given: Not Answered  He does not smoke. Activities of Daily Living In your present state of health, do you have any difficulty performing the following activities: 07/19/2016 02/11/2016  Hearing? Y N  Vision? Y N  Difficulty concentrating or making decisions? N N  Walking or climbing stairs? N N  Dressing or bathing? N N  Doing errands, shopping? N N  Some recent data might be hidden  Slight hearing loss and wears glasses for close up  Immunizations and Health Maintenance Immunization History  Administered Date(s) Administered  . Pneumococcal Conjugate-13 04/14/2013  . Pneumococcal Polysaccharide-23 09/16/2014  . Td 01/29/2015     Health Maintenance Due  Topic Date Due  . FOOT EXAM  03/14/2016  . HEMOGLOBIN A1C  07/09/2016    Patient Care Team: Sueanne Margarita, MD as PCP - General (Cardiology) Gatha Mayer, MD as Consulting Physician (Gastroenterology)  Indicate any recent Medical Services you may have received from other than [facility] providers in the past year (date may be approximate).    Assessment:   This is a routine wellness examination for Richad.   Hearing/Vision screen No exam data present Wears glasses, slight hearing loss Dietary issues and exercise activities discussed:    Goals    . Blood Pressure < 130/80    . Eat more fruits and vegetables      Depression Screen PHQ 2/9 Scores 07/19/2016 02/13/2016 01/23/2016 12/27/2015  PHQ - 2 Score 0 0 0 0    Fall Risk Fall Risk  07/19/2016 02/13/2016 01/23/2016 12/27/2015 09/19/2015  Falls in the past year? No No No No No    Cognitive Function: MMSE - Mini Mental State Exam 07/19/2016  Orientation to time 5  Orientation to Place 5  Registration 3  Attention/ Calculation 5  Recall 0  Language- name 2 objects 2  Language- repeat 1  Language- follow 3 step command 3  Language- read & follow direction 1  Write a sentence 1  Copy design 1  Total score 27    27/30    Screening Tests Health Maintenance  Topic Date Due  . FOOT EXAM  03/14/2016  . HEMOGLOBIN A1C  07/09/2016  . INFLUENZA VACCINE  12/26/2021 (Originally 10/24/2016)  . OPHTHALMOLOGY EXAM  12/12/2016  . COLONOSCOPY  12/19/2017  . TETANUS/TDAP  01/28/2025  . Hepatitis C Screening  Completed  . PNA vac Low Risk Adult  Completed        Plan:    Pt has appt on Aug 14, 2016. We will get labs and PSA at that time. We will also revisit the shingles vaccine. He is up to date on all other health . He received Advanced directives and will discuss with his wife. He is concerned the the cost of Actonel, we will explore other options.  I have personally reviewed and noted the following  in the patient's chart:   . Medical and social history . Use of alcohol, tobacco or illicit drugs  . Current medications and supplements . Functional ability and status . Nutritional status . Physical activity . Advanced directives . List of other physicians . Hospitalizations, surgeries, and ER visits in previous 12 months . Vitals . Screenings to include cognitive, depression, and falls . Referrals and appointments  In addition, I have reviewed and discussed with patient certain preventive protocols,  quality metrics, and best practice recommendations. A written personalized care plan for preventive services as well as general preventive health recommendations were provided to patient.     Rana Snare, LPN   9/82/6415   I have reviewed and agree with the above AWV documentation.   Mary-Margaret Hassell Done, FNP

## 2016-08-03 ENCOUNTER — Telehealth: Payer: Self-pay | Admitting: *Deleted

## 2016-08-03 MED ORDER — ALENDRONATE SODIUM 70 MG PO TABS: 70.0000 mg | ORAL_TABLET | ORAL | 2 refills | Status: DC

## 2016-08-03 NOTE — Telephone Encounter (Signed)
Stop risendronate.  Switch to Alendronate 70mg  Take 1 tablet WEEKLY on empty stomach - Rx sent to [facility]

## 2016-08-03 NOTE — Telephone Encounter (Signed)
I saw pt at his AWV. He mentioned that the  actonel 150mg  is very expensive, like $120 a month. Is there a replacement?

## 2016-08-03 NOTE — Telephone Encounter (Signed)
Called pt, he will pick up today

## 2016-08-14 ENCOUNTER — Encounter: Payer: Self-pay | Admitting: Nurse Practitioner

## 2016-08-14 ENCOUNTER — Ambulatory Visit (INDEPENDENT_AMBULATORY_CARE_PROVIDER_SITE_OTHER): Payer: PPO | Admitting: Nurse Practitioner

## 2016-08-14 VITALS — BP 134/74 | HR 53 | Temp 97.1°F | Ht 68.0 in | Wt 153.0 lb

## 2016-08-14 DIAGNOSIS — R634 Abnormal weight loss: Secondary | ICD-10-CM

## 2016-08-14 DIAGNOSIS — E119 Type 2 diabetes mellitus without complications: Secondary | ICD-10-CM | POA: Diagnosis not present

## 2016-08-14 DIAGNOSIS — I10 Essential (primary) hypertension: Secondary | ICD-10-CM | POA: Diagnosis not present

## 2016-08-14 DIAGNOSIS — E785 Hyperlipidemia, unspecified: Secondary | ICD-10-CM | POA: Diagnosis not present

## 2016-08-14 DIAGNOSIS — Z125 Encounter for screening for malignant neoplasm of prostate: Secondary | ICD-10-CM | POA: Diagnosis not present

## 2016-08-14 LAB — BAYER DCA HB A1C WAIVED: HB A1C (BAYER DCA - WAIVED): 5.8 % (ref <7.0)

## 2016-08-14 MED ORDER — BENAZEPRIL HCL 40 MG PO TABS: 40.0000 mg | ORAL_TABLET | Freq: Every day | ORAL | 1 refills | Status: DC

## 2016-08-14 MED ORDER — METFORMIN HCL 500 MG PO TABS: 500.0000 mg | ORAL_TABLET | ORAL | 5 refills | Status: DC

## 2016-08-14 NOTE — Addendum Note (Signed)
Addended by: Chevis Pretty on: 08/14/2016 10:35 AM   Modules accepted: Orders

## 2016-08-14 NOTE — Patient Instructions (Signed)
Diabetes Mellitus and Exercise Exercising regularly is important for your overall health, especially when you have diabetes (diabetes mellitus). Exercising is not only about losing weight. It has many health benefits, such as increasing muscle strength and bone density and reducing body fat and stress. This leads to improved fitness, flexibility, and endurance, all of which result in better overall health. Exercise has additional benefits for people with diabetes, including:  Reducing appetite.  Helping to lower and control blood glucose.  Lowering blood pressure.  Helping to control amounts of fatty substances (lipids) in the blood, such as cholesterol and triglycerides.  Helping the body to respond better to insulin (improving insulin sensitivity).  Reducing how much insulin the body needs.  Decreasing the risk for heart disease by:  Lowering cholesterol and triglyceride levels.  Increasing the levels of good cholesterol.  Lowering blood glucose levels. What is my activity plan? Your health care provider or certified diabetes educator can help you make a plan for the type and frequency of exercise (activity plan) that works for you. Make sure that you:  Do at least 150 minutes of moderate-intensity or vigorous-intensity exercise each week. This could be brisk walking, biking, or water aerobics.  Do stretching and strength exercises, such as yoga or weightlifting, at least 2 times a week.  Spread out your activity over at least 3 days of the week.  Get some form of physical activity every day.  Do not go more than 2 days in a row without some kind of physical activity.  Avoid being inactive for more than 90 minutes at a time. Take frequent breaks to walk or stretch.  Choose a type of exercise or activity that you enjoy, and set realistic goals.  Start slowly, and gradually increase the intensity of your exercise over time. What do I need to know about managing my  diabetes?  Check your blood glucose before and after exercising.  If your blood glucose is higher than 240 mg/dL (13.3 mmol/L) before you exercise, check your urine for ketones. If you have ketones in your urine, do not exercise until your blood glucose returns to normal.  Know the symptoms of low blood glucose (hypoglycemia) and how to treat it. Your risk for hypoglycemia increases during and after exercise. Common symptoms of hypoglycemia can include:  Hunger.  Anxiety.  Sweating and feeling clammy.  Confusion.  Dizziness or feeling light-headed.  Increased heart rate or palpitations.  Blurry vision.  Tingling or numbness around the mouth, lips, or tongue.  Tremors or shakes.  Irritability.  Keep a rapid-acting carbohydrate snack available before, during, and after exercise to help prevent or treat hypoglycemia.  Avoid injecting insulin into areas of the body that are going to be exercised. For example, avoid injecting insulin into:  The arms, when playing tennis.  The legs, when jogging.  Keep records of your exercise habits. Doing this can help you and your health care provider adjust your diabetes management plan as needed. Write down:  Food that you eat before and after you exercise.  Blood glucose levels before and after you exercise.  The type and amount of exercise you have done.  When your insulin is expected to peak, if you use insulin. Avoid exercising at times when your insulin is peaking.  When you start a new exercise or activity, work with your health care provider to make sure the activity is safe for you, and to adjust your insulin, medicines, or food intake as needed.  Drink plenty   of water while you exercise to prevent dehydration or heat stroke. Drink enough fluid to keep your urine clear or pale yellow. This information is not intended to replace advice given to you by your health care provider. Make sure you discuss any questions you have with  your health care provider. Document Released: 06/02/2003 Document Revised: 09/30/2015 Document Reviewed: 08/22/2015 Elsevier Interactive Patient Education  2017 Elsevier Inc.  

## 2016-08-14 NOTE — Progress Notes (Signed)
Subjective:    Patient ID: Peter York, male    DOB: February 25, 1946, 71 y.o.   MRN: [id number]  HPI  Peter York is here today for follow up of chronic medical problem.  Outpatient Encounter Prescriptions as of 08/14/2016  Medication Sig  . alendronate (FOSAMAX) 70 MG tablet Take 1 tablet (70 mg total) by mouth every 7 (seven) days. Take with a full glass of water on an empty stomach.  [name] amLODipine (NORVASC) 10 MG tablet Take 1 tablet (10 mg total) by mouth daily.  [name] aspirin 81 MG tablet Take 81 mg by mouth daily.  . benazepril (LOTENSIN) 40 MG tablet Take 1 tablet (40 mg total) by mouth daily.  . chlorthalidone (HYGROTON) 25 MG tablet Take 1 tablet (25 mg total) by mouth daily.  . cholecalciferol (VITAMIN D) 1000 units tablet Take 1 tablet (1,000 Units total) by mouth daily.  . cloNIDine (CATAPRES) 0.1 MG tablet Take 1 tablet (0.1 mg total) by mouth daily.  . metFORMIN (GLUCOPHAGE) 500 MG tablet Take 500 mg by mouth every morning. Take 1025m (2 tablets) by mouth every night  . Omega-3 Fatty Acids (FISH OIL) 1000 MG CAPS Take 2,000 mg by mouth 2 (two) times daily.  . ONE TOUCH ULTRA TEST test strip USE TO CHECK BLOOD SUGAR ONCE A DAY AS INSTRUCTED  . vitamin E 400 UNIT capsule Take 1 capsule (400 Units total) by mouth daily.   No facility-administered encounter medications on file as of 08/14/2016.     1. Type 2 diabetes mellitus without complication, without long-term current use of insulin (HCC)   Last hgba1c was 6.4%. He does real well with watching diet and exercises daily. No c/o hypoglycemic events.  Fasting blood glucose 130-140.  2. Hyperlipidemia with target LDL less than 100  No problems that he is aware of.  3. Essential hypertension  No c/o chest pain ,SOB or HA- does not check blood pressure at home very often.  4. Recent unexplained weight loss  No change since last visit.    New complaints: None today     Review of Systems  Constitutional: Negative for  activity change and appetite change.  Respiratory: Negative for cough, chest tightness and shortness of breath.   Cardiovascular: Negative for chest pain and palpitations.  Gastrointestinal: Negative for abdominal distention and abdominal pain.  Neurological: Negative for dizziness and light-headedness.  All other systems reviewed and are negative. I      Objective:   Physical Exam  Constitutional: He is oriented to person, place, and time. He appears well-developed and well-nourished.  HENT:  Head: Normocephalic.  Right Ear: External ear normal.  Left Ear: External ear normal.  Nose: Nose normal.  Mouth/Throat: Oropharynx is clear and moist.  Eyes: Pupils are equal, round, and reactive to light.  Neck: Normal range of motion. Neck supple.  Cardiovascular: Normal rate, regular rhythm and normal heart sounds.   No murmur heard. Pulmonary/Chest: Effort normal and breath sounds normal.  Abdominal: Soft. Bowel sounds are normal. There is no tenderness.  Musculoskeletal: Normal range of motion.  Neurological: He is alert and oriented to person, place, and time.  Skin: Skin is warm and dry.  Psychiatric: He has a normal mood and affect. His behavior is normal. Judgment and thought content normal.    BP 134/74   Pulse (!) 53   Temp 97.1 F (36.2 C) (Oral)   Ht 5' 8"  (1.727 m)   Wt 153 lb (69.4 kg)  BMI 23.26 kg/m  HbA1C: 5.8%     Assessment & Plan:  1. Type 2 diabetes mellitus without complication, without long-term current use of insulin (HCC) When taking metformin, cut back to 500 mg (1 tablet) in the morning and 500 mg (1 tablet) at bedtime. - Bayer DCA Hb A1c Waived - Microalbumin / creatinine urine ratio - metFORMIN (GLUCOPHAGE) 500 MG tablet; Take 1 tablet (500 mg total) by mouth every morning. Take 1023m (2 tablets) by mouth every night  Dispense: 90 tablet; Refill: 5  2. Hyperlipidemia with target LDL less than 100 Low fat diet. - Lipid panel  3. Essential  hypertension Low salt diet. - CMP14+EGFR - benazepril (LOTENSIN) 40 MG tablet; Take 1 tablet (40 mg total) by mouth daily.  Dispense: 90 tablet; Refill: 1  4. Recent unexplained weight loss None at this visit.  Maintain current diet and exercise routine.    Labs pending Health maintenance reviewed Diet and exercise encouraged Continue all meds Follow up  In 3 months   Mary-Margaret MHassell Done FNP Marissa Hite, SNP

## 2016-08-15 ENCOUNTER — Other Ambulatory Visit: Payer: Self-pay | Admitting: Nurse Practitioner

## 2016-08-15 ENCOUNTER — Telehealth: Payer: Self-pay | Admitting: Pediatrics

## 2016-08-15 ENCOUNTER — Ambulatory Visit (INDEPENDENT_AMBULATORY_CARE_PROVIDER_SITE_OTHER): Payer: PPO

## 2016-08-15 DIAGNOSIS — E875 Hyperkalemia: Secondary | ICD-10-CM

## 2016-08-15 DIAGNOSIS — R972 Elevated prostate specific antigen [PSA]: Secondary | ICD-10-CM

## 2016-08-15 LAB — LIPID PANEL
CHOLESTEROL TOTAL: 243 mg/dL — AB (ref 100–199)
Chol/HDL Ratio: 6.9 ratio — ABNORMAL HIGH (ref 0.0–5.0)
HDL: 35 mg/dL — AB (ref >39)
LDL CALC: 175 mg/dL — AB (ref 0–99)
Triglycerides: 163 mg/dL — ABNORMAL HIGH (ref 0–149)
VLDL CHOLESTEROL CAL: 33 mg/dL (ref 5–40)

## 2016-08-15 LAB — CMP14+EGFR
ALBUMIN: 4.5 g/dL (ref 3.5–4.8)
ALK PHOS: 51 IU/L (ref 39–117)
ALT: 10 IU/L (ref 0–44)
AST: 14 IU/L (ref 0–40)
Albumin/Globulin Ratio: 1.5 (ref 1.2–2.2)
BILIRUBIN TOTAL: 0.4 mg/dL (ref 0.0–1.2)
BUN / CREAT RATIO: 19 (ref 10–24)
BUN: 34 mg/dL — AB (ref 8–27)
CHLORIDE: 105 mmol/L (ref 96–106)
CO2: 22 mmol/L (ref 18–29)
CREATININE: 1.75 mg/dL — AB (ref 0.76–1.27)
Calcium: 9.4 mg/dL (ref 8.6–10.2)
GFR calc Af Amer: 44 mL/min/{1.73_m2} — ABNORMAL LOW (ref >59)
GFR calc non Af Amer: 38 mL/min/{1.73_m2} — ABNORMAL LOW (ref >59)
GLUCOSE: 111 mg/dL — AB (ref 65–99)
Globulin, Total: 3 g/dL (ref 1.5–4.5)
Potassium: 6.5 mmol/L (ref 3.5–5.2)
Sodium: 139 mmol/L (ref 134–144)
Total Protein: 7.5 g/dL (ref 6.0–8.5)

## 2016-08-15 LAB — PSA, TOTAL AND FREE
PSA FREE PCT: 29.1 %
PSA FREE: 2.3 ng/mL
Prostate Specific Ag, Serum: 7.9 ng/mL — ABNORMAL HIGH (ref 0.0–4.0)

## 2016-08-15 LAB — MICROALBUMIN / CREATININE URINE RATIO
Creatinine, Urine: 148.4 mg/dL
MICROALBUM., U, RANDOM: 67.5 ug/mL
Microalb/Creat Ratio: 45.5 mg/g creat — ABNORMAL HIGH (ref 0.0–30.0)

## 2016-08-15 MED ORDER — SODIUM POLYSTYRENE SULFONATE 15 GM/60ML PO SUSP: 15.0000 g | Freq: Once | ORAL | 0 refills | Status: AC

## 2016-08-15 MED ORDER — PITAVASTATIN CALCIUM 4 MG PO TABS: 1.0000 | ORAL_TABLET | Freq: Every day | ORAL | 5 refills | Status: DC

## 2016-08-15 NOTE — Telephone Encounter (Signed)
Patient aware to stop taking benazepril and to double chlorthalidone.  Patient will come in today 08/15/16 to have labs redrawn, ekg and bp check per Dr. Evette Doffing.  Appt made for 2pm 08/15/16 with Nurse for EKG.

## 2016-08-15 NOTE — Telephone Encounter (Signed)
K elevated, stop benazepril, added to allergy list for hyperkalemia. Check BPs at home. Take 50mg  chlorthalidone daily, increase from 25mg . Come back for K recheck, EKG.

## 2016-08-15 NOTE — Progress Notes (Signed)
Patient returned to clinic for an EKG due to elevated potassium. Dr Evette Doffing reviewed EKG while he was here. Potassium was redrawn and kayexlate was sent to the pharmacy so patient can take tonight. Will contact patient tomorrow with lab results and further instructions. Patient verbalized understanding. No complaints of any kind

## 2016-08-16 ENCOUNTER — Other Ambulatory Visit: Payer: Self-pay | Admitting: Nurse Practitioner

## 2016-08-16 LAB — BMP8+EGFR
BUN / CREAT RATIO: 17 (ref 10–24)
BUN: 36 mg/dL — ABNORMAL HIGH (ref 8–27)
CO2: 20 mmol/L (ref 18–29)
Calcium: 9 mg/dL (ref 8.6–10.2)
Chloride: 105 mmol/L (ref 96–106)
Creatinine, Ser: 2.08 mg/dL — ABNORMAL HIGH (ref 0.76–1.27)
GFR calc Af Amer: 36 mL/min/{1.73_m2} — ABNORMAL LOW (ref >59)
GFR calc non Af Amer: 31 mL/min/{1.73_m2} — ABNORMAL LOW (ref >59)
GLUCOSE: 112 mg/dL — AB (ref 65–99)
POTASSIUM: 5.5 mmol/L — AB (ref 3.5–5.2)
SODIUM: 138 mmol/L (ref 134–144)

## 2016-08-28 DIAGNOSIS — R972 Elevated prostate specific antigen [PSA]: Secondary | ICD-10-CM | POA: Diagnosis not present

## 2016-09-06 ENCOUNTER — Encounter: Payer: Self-pay | Admitting: Pediatrics

## 2016-09-06 ENCOUNTER — Ambulatory Visit (INDEPENDENT_AMBULATORY_CARE_PROVIDER_SITE_OTHER): Payer: PPO | Admitting: Pediatrics

## 2016-09-06 VITALS — BP 129/72 | HR 61 | Temp 97.5°F | Ht 68.0 in | Wt 157.4 lb

## 2016-09-06 DIAGNOSIS — M1990 Unspecified osteoarthritis, unspecified site: Secondary | ICD-10-CM

## 2016-09-06 DIAGNOSIS — I10 Essential (primary) hypertension: Secondary | ICD-10-CM | POA: Diagnosis not present

## 2016-09-06 MED ORDER — CLONIDINE HCL 0.1 MG PO TABS
0.1000 mg | ORAL_TABLET | Freq: Two times a day (BID) | ORAL | 3 refills | Status: DC
Start: 2016-09-06 — End: 2016-11-19

## 2016-09-06 MED ORDER — PREDNISONE 10 MG (21) PO TBPK: ORAL_TABLET | Freq: Every day | ORAL | 0 refills | Status: DC

## 2016-09-06 NOTE — Progress Notes (Signed)
  Subjective:   Patient ID: Peter York, male    DOB: [birth year], 71 y.o.   MRN: [id number] CC: Hand Pain (Middle left hand. 122k)  HPI: Peter York is a 71 y.o. male presenting for Hand Pain (Middle left hand. 122k)  DM2: taking metformin twice a day A1c < 6, has been well controlled  Hyperkalemia: some renal insufficiency, on ACE-i  Finger pain: L middle finger with PIP swelling for past few days Slightly red and tender Cant fully bend finger due to pain Doesn't remember injurin git Never had gout before No other joints affected No fevers  Relevant past medical, surgical, family and social history reviewed. Allergies and medications reviewed and updated. History  Smoking Status  . Former Smoker  . Quit date: 03/26/1978  Smokeless Tobacco  . Never Used   ROS: Per HPI   Objective:    BP 129/72   Pulse 61   Temp 97.5 F (36.4 C) (Oral)   Ht 5\' 8"  (1.727 m)   Wt 157 lb 6.4 oz (71.4 kg)   BMI 23.93 kg/m   Wt Readings from Last 3 Encounters:  09/06/16 157 lb 6.4 oz (71.4 kg)  08/14/16 153 lb (69.4 kg)  07/19/16 153 lb (69.4 kg)    Gen: NAD, alert, cooperative with exam, NCAT EYES: EOMI, no conjunctival injection, or no icterus ENT:  TMs pearly gray b/l, OP without erythema LYMPH: no cervical LAD CV: NRRR, normal S1/S2 Resp: CTABL, no wheezes, normal WOB Ext: No edema, warm Neuro: Alert and oriented MSK: L 3rd finger PIP joint slightly erythematous, decreased ROM due to pain, mildly ttp Skin: intact on L hand, no breaks in skin on hand or fingers  Assessment & Plan:  Peter York was seen today for hand pain.  Diagnoses and all orders for this visit:  Inflammatory arthritis Any worsening on steroids needs to be seen for re-eval -     predniSONE (STERAPRED UNI-PAK 21 TAB) 10 MG (21) TBPK tablet; Take by mouth daily. As directed x 6 days  Essential hypertension Stop ACE-I due to persistently elevated K  Take below BID Follow BPs at home, let us know if stays  elevated -     cloNIDine (CATAPRES) 0.1 MG tablet; Take 1 tablet (0.1 mg total) by mouth 2 (two) times daily.   Follow up plan: As scheduled Assunta Found, MD [place]

## 2016-09-06 NOTE — Patient Instructions (Signed)
Steroid per directions on pack x 6 days  Stop benazepril Take clonidine twice a day If BP remains elevated, let us know

## 2016-09-10 ENCOUNTER — Ambulatory Visit: Payer: PPO | Admitting: Physician Assistant

## 2016-11-19 ENCOUNTER — Encounter: Payer: Self-pay | Admitting: Nurse Practitioner

## 2016-11-19 ENCOUNTER — Ambulatory Visit (INDEPENDENT_AMBULATORY_CARE_PROVIDER_SITE_OTHER): Payer: PPO | Admitting: Nurse Practitioner

## 2016-11-19 VITALS — BP 134/81 | HR 54 | Temp 96.9°F | Ht 68.0 in | Wt 157.0 lb

## 2016-11-19 DIAGNOSIS — E782 Mixed hyperlipidemia: Secondary | ICD-10-CM

## 2016-11-19 DIAGNOSIS — I10 Essential (primary) hypertension: Secondary | ICD-10-CM

## 2016-11-19 DIAGNOSIS — M810 Age-related osteoporosis without current pathological fracture: Secondary | ICD-10-CM

## 2016-11-19 DIAGNOSIS — E119 Type 2 diabetes mellitus without complications: Secondary | ICD-10-CM

## 2016-11-19 LAB — BAYER DCA HB A1C WAIVED: HB A1C (BAYER DCA - WAIVED): 6.6 % (ref <7.0)

## 2016-11-19 MED ORDER — METFORMIN HCL 500 MG PO TABS
500.0000 mg | ORAL_TABLET | ORAL | 5 refills | Status: DC
Start: 2016-11-19 — End: 2016-11-19

## 2016-11-19 MED ORDER — GLIMEPIRIDE 4 MG PO TABS: 4.0000 mg | ORAL_TABLET | Freq: Every day | ORAL | 1 refills | Status: DC

## 2016-11-19 MED ORDER — PITAVASTATIN CALCIUM 4 MG PO TABS: 1.0000 | ORAL_TABLET | Freq: Every day | ORAL | 5 refills | Status: DC

## 2016-11-19 MED ORDER — CLONIDINE HCL 0.1 MG PO TABS: 0.1000 mg | ORAL_TABLET | Freq: Two times a day (BID) | ORAL | 3 refills | Status: DC

## 2016-11-19 MED ORDER — AMLODIPINE BESYLATE 10 MG PO TABS
10.0000 mg | ORAL_TABLET | Freq: Every day | ORAL | 3 refills | Status: DC
Start: 2016-11-19 — End: 2017-09-05

## 2016-11-19 MED ORDER — CHLORTHALIDONE 25 MG PO TABS: 25.0000 mg | ORAL_TABLET | Freq: Every day | ORAL | 3 refills | Status: DC

## 2016-11-19 NOTE — Progress Notes (Signed)
Subjective:    Patient ID: Peter York, male    DOB: [birth year], 71 y.o.   MRN: [id number]  HPI  Peter York is here today for follow up of chronic medical problem.  Outpatient Encounter Prescriptions as of 11/19/2016  Medication Sig  . alendronate (FOSAMAX) 70 MG tablet Take 1 tablet (70 mg total) by mouth every 7 (seven) days. Take with a full glass of water on an empty stomach.  [name] amLODipine (NORVASC) 10 MG tablet Take 1 tablet (10 mg total) by mouth daily.  [name] aspirin 81 MG tablet Take 81 mg by mouth daily.  . chlorthalidone (HYGROTON) 25 MG tablet Take 1 tablet (25 mg total) by mouth daily.  . cholecalciferol (VITAMIN D) 1000 units tablet Take 1 tablet (1,000 Units total) by mouth daily.  . cloNIDine (CATAPRES) 0.1 MG tablet Take 1 tablet (0.1 mg total) by mouth 2 (two) times daily.  . metFORMIN (GLUCOPHAGE) 500 MG tablet Take 1 tablet (500 mg total) by mouth every morning. Take 1061m (2 tablets) by mouth every night  . Omega-3 Fatty Acids (FISH OIL) 1000 MG CAPS Take 2,000 mg by mouth 2 (two) times daily.  . ONE TOUCH ULTRA TEST test strip USE TO CHECK BLOOD SUGAR ONCE A DAY AS INSTRUCTED  . Pitavastatin Calcium (LIVALO) 4 MG TABS Take 1 tablet (4 mg total) by mouth daily.  . predniSONE (STERAPRED UNI-PAK 21 TAB) 10 MG (21) TBPK tablet Take by mouth daily. As directed x 6 days  . vitamin E 400 UNIT capsule Take 1 capsule (400 Units total) by mouth daily.   No facility-administered encounter medications on file as of 11/19/2016.     1. Essential hypertension  No c/o chest pain , SOB or headache. Does not chack blood pressures at home  2. Type 2 diabetes mellitus without complication, without long-term current use of insulin (HCC)  Last HGBA1c was 5.8%. His blood sugars ave been around 120 fasting when he checks it.  3. Osteoporosis of vertebra  He has had a vertebral fracture in the past. He does a lot of horse back riding which can put pressure on spinal column  4.  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    Review of Systems  Constitutional: Negative for activity change and appetite change.  HENT: Negative.   Eyes: Negative for pain.  Respiratory: Negative for shortness of breath.   Cardiovascular: Negative for chest pain, palpitations and leg swelling.  Gastrointestinal: Negative for abdominal pain.  Endocrine: Negative for polydipsia.  Genitourinary: Negative.   Skin: Negative for rash.  Neurological: Negative for dizziness, weakness and headaches.  Hematological: Does not bruise/bleed easily.  Psychiatric/Behavioral: Negative.   All other systems reviewed and are negative.      Objective:   Physical Exam  Constitutional: He is oriented to person, place, and time. He appears well-developed and well-nourished.  HENT:  Head: Normocephalic.  Right Ear: External ear normal.  Left Ear: External ear normal.  Nose: Nose normal.  Mouth/Throat: Oropharynx is clear and moist.  Eyes: Pupils are equal, round, and reactive to light. EOM are normal.  Neck: Normal range of motion. Neck supple. No JVD present. No thyromegaly present.  Cardiovascular: Normal rate, regular rhythm, normal heart sounds and intact distal pulses.  Exam reveals no gallop and no friction rub.  No murmur heard. Pulmonary/Chest: Effort normal and breath sounds normal. No respiratory distress. He has no wheezes. He has no rales. He exhibits no tenderness.  Abdominal: Soft. Bowel sounds are normal. He exhibits no mass. There is no tenderness.  Genitourinary: Prostate normal and penis normal.  Musculoskeletal: Normal range of motion. He exhibits no edema.  Lymphadenopathy:    He has no cervical adenopathy.  Neurological: He is alert and oriented to person, place, and time. No cranial nerve deficit.  Skin: Skin is  warm and dry.  Psychiatric: He has a normal mood and affect. His behavior is normal. Judgment and thought content normal.   BP 134/81   Pulse (!) 54   Temp (!) 96.9 F (36.1 C) (Oral)   Ht 5' 8"  (1.727 m)   Wt 157 lb (71.2 kg)   BMI 23.87 kg/m     Assessment & Plan:  1. Essential hypertension LOW SODIUM DIET - CMP14+EGFR - chlorthalidone (HYGROTON) 25 MG tablet; Take 1 tablet (25 mg total) by mouth daily.  Dispense: 90 tablet; Refill: 3 - amLODipine (NORVASC) 10 MG tablet; Take 1 tablet (10 mg total) by mouth daily.  Dispense: 90 tablet; Refill: 3 - cloNIDine (CATAPRES) 0.1 MG tablet; Take 1 tablet (0.1 mg total) by mouth 2 (two) times daily.  Dispense: 180 tablet; Refill: 3  2. Type 2 diabetes mellitus without complication, without long-term current use of insulin (HCC) Stop metformin due to kidney function Start amaryll- watch for low blood sugars - Bayer DCA Hb A1c Waived - glimepiride (AMARYL) 4 MG tablet; Take 1 tablet (4 mg total) by mouth daily with breakfast.  Dispense: 90 tablet; Refill: 1  3. Osteoporosis of vertebra Weight bearing exercises  4. Mixed hyperlipidemia Low fat diet - Lipid panel - Pitavastatin Calcium (LIVALO) 4 MG TABS; Take 1 tablet (4 mg total) by mouth daily.  Dispense: 30 tablet; Refill: 5    Labs pending Health maintenance reviewed Diet and exercise encouraged Continue all meds Follow up  In 3 months   [place], FNP

## 2016-11-19 NOTE — Patient Instructions (Signed)
Hypoglycemia Hypoglycemia is when the sugar (glucose) level in the blood is too low. Symptoms of low blood sugar may include:  Feeling: ? Hungry. ? Worried or nervous (anxious). ? Sweaty and clammy. ? Confused. ? Dizzy. ? Sleepy. ? Sick to your stomach (nauseous).  Having: ? A fast heartbeat. ? A headache. ? A change in your vision. ? Jerky movements that you cannot control (seizure). ? Nightmares. ? Tingling or no feeling (numbness) around the mouth, lips, or tongue.  Having trouble with: ? Talking. ? Paying attention (concentrating). ? Moving (coordination). ? Sleeping.  Shaking.  Passing out (fainting).  Getting upset easily (irritability).  Low blood sugar can happen to people who have diabetes and people who do not have diabetes. Low blood sugar can happen quickly, and it can be an emergency. Treating Low Blood Sugar Low blood sugar is often treated by eating or drinking something sugary right away. If you can think clearly and swallow safely, follow the 15:15 rule:  Take 15 grams of a fast-acting carb (carbohydrate). Some fast-acting carbs are: ? 1 tube of glucose gel. ? 3 sugar tablets (glucose pills). ? 6-8 pieces of hard candy. ? 4 oz (120 mL) of fruit juice. ? 4 oz (120 mL) of regular (not diet) soda.  Check your blood sugar 15 minutes after you take the carb.  If your blood sugar is still at or below 70 mg/dL (3.9 mmol/L), take 15 grams of a carb again.  If your blood sugar does not go above 70 mg/dL (3.9 mmol/L) after 3 tries, get help right away.  After your blood sugar goes back to normal, eat a meal or a snack within 1 hour.  Treating Very Low Blood Sugar If your blood sugar is at or below 54 mg/dL (3 mmol/L), you have very low blood sugar (severe hypoglycemia). This is an emergency. Do not wait to see if the symptoms will go away. Get medical help right away. Call your local emergency services (911 in the [place] Do not drive yourself to the  hospital. If you have very low blood sugar and you cannot eat or drink, you may need a glucagon shot (injection). A family member or friend should learn how to check your blood sugar and how to give you a glucagon shot. Ask your doctor if you need to have a glucagon shot kit at home. Follow these instructions at home: General instructions  Avoid any diets that cause you to not eat enough food. Talk with your doctor before you start any new diet.  Take over-the-counter and prescription medicines only as told by your doctor.  Limit alcohol to no more than 1 drink per day for nonpregnant women and 2 drinks per day for men. One drink equals 12 oz of beer, 5 oz of wine, or 1 oz of hard liquor.  Keep all follow-up visits as told by your doctor. This is important. If You Have Diabetes:   Make sure you know the symptoms of low blood sugar.  Always keep a source of sugar with you, such as: ? Sugar. ? Sugar tablets. ? Glucose gel. ? Fruit juice. ? Regular soda (not diet soda). ? Milk. ? Hard candy. ? Honey.  Take your medicines as told.  Follow your exercise and meal plan. ? Eat on time. Do not skip meals. ? Follow your sick day plan when you cannot eat or drink normally. Make this plan ahead of time with your doctor.  Check your blood sugar as often   as told by your doctor. Always check before and after exercise.  Share your diabetes care plan with: ? Your work or school. ? People you live with.  Check your pee (urine) for ketones: ? When you are sick. ? As told by your doctor.  Carry a card or wear jewelry that says you have diabetes. If You Have Low Blood Sugar From Other Causes:   Check your blood sugar as often as told by your doctor.  Follow instructions from your doctor about what you cannot eat or drink. Contact a doctor if:  You have trouble keeping your blood sugar in your target range.  You have low blood sugar often. Get help right away if:  You still have  symptoms after you eat or drink something sugary.  Your blood sugar is at or below 54 mg/dL (3 mmol/L).  You have jerky movements that you cannot control.  You pass out. These symptoms may be an emergency. Do not wait to see if the symptoms will go away. Get medical help right away. Call your local emergency services (911 in the [place] Do not drive yourself to the hospital. This information is not intended to replace advice given to you by your health care provider. Make sure you discuss any questions you have with your health care provider. Document Released: 06/06/2009 Document Revised: 08/18/2015 Document Reviewed: 04/15/2015 Elsevier Interactive Patient Education  [employer].

## 2016-11-20 LAB — CMP14+EGFR
A/G RATIO: 1.6 (ref 1.2–2.2)
ALT: 13 IU/L (ref 0–44)
AST: 15 IU/L (ref 0–40)
Albumin: 4.5 g/dL (ref 3.5–4.8)
Alkaline Phosphatase: 54 IU/L (ref 39–117)
BUN/Creatinine Ratio: 17 (ref 10–24)
BUN: 26 mg/dL (ref 8–27)
Bilirubin Total: 0.4 mg/dL (ref 0.0–1.2)
CALCIUM: 9.3 mg/dL (ref 8.6–10.2)
CO2: 20 mmol/L (ref 20–29)
CREATININE: 1.56 mg/dL — AB (ref 0.76–1.27)
Chloride: 103 mmol/L (ref 96–106)
GFR calc Af Amer: 51 mL/min/{1.73_m2} — ABNORMAL LOW (ref >59)
GFR, EST NON AFRICAN AMERICAN: 44 mL/min/{1.73_m2} — AB (ref >59)
Globulin, Total: 2.8 g/dL (ref 1.5–4.5)
Glucose: 154 mg/dL — ABNORMAL HIGH (ref 65–99)
POTASSIUM: 4.8 mmol/L (ref 3.5–5.2)
Sodium: 139 mmol/L (ref 134–144)
Total Protein: 7.3 g/dL (ref 6.0–8.5)

## 2016-11-20 LAB — LIPID PANEL
CHOL/HDL RATIO: 7.2 ratio — AB (ref 0.0–5.0)
CHOLESTEROL TOTAL: 238 mg/dL — AB (ref 100–199)
HDL: 33 mg/dL — AB (ref >39)
LDL Calculated: 172 mg/dL — ABNORMAL HIGH (ref 0–99)
TRIGLYCERIDES: 163 mg/dL — AB (ref 0–149)
VLDL Cholesterol Cal: 33 mg/dL (ref 5–40)

## 2016-11-27 DIAGNOSIS — R972 Elevated prostate specific antigen [PSA]: Secondary | ICD-10-CM | POA: Diagnosis not present

## 2016-12-04 ENCOUNTER — Other Ambulatory Visit: Payer: Self-pay | Admitting: Nurse Practitioner

## 2016-12-07 DIAGNOSIS — E119 Type 2 diabetes mellitus without complications: Secondary | ICD-10-CM | POA: Diagnosis not present

## 2016-12-07 DIAGNOSIS — H40033 Anatomical narrow angle, bilateral: Secondary | ICD-10-CM | POA: Diagnosis not present

## 2016-12-13 ENCOUNTER — Encounter: Payer: Self-pay | Admitting: Nurse Practitioner

## 2016-12-13 ENCOUNTER — Ambulatory Visit (INDEPENDENT_AMBULATORY_CARE_PROVIDER_SITE_OTHER): Payer: PPO | Admitting: Nurse Practitioner

## 2016-12-13 VITALS — BP 131/81 | HR 78 | Temp 98.9°F | Ht 68.0 in | Wt 155.0 lb

## 2016-12-13 DIAGNOSIS — R509 Fever, unspecified: Secondary | ICD-10-CM | POA: Diagnosis not present

## 2016-12-13 NOTE — Patient Instructions (Signed)
Fever, Adult A fever is an increase in the body's temperature. It is often defined as a temperature of 100 F (38C) or higher. Short mild or moderate fevers often have no long-term effects. They also often do not need treatment. Moderate or high fevers may make you feel uncomfortable. Sometimes, they can also be a sign of a serious illness or disease. The sweating that may happen with repeated fevers or fevers that last a while may also cause you to not have enough fluid in your body (dehydration). You can take your temperature with a thermometer to see if you have a fever. A measured temperature can change with:  Age.  Time of day.  Where the thermometer is placed: ? Mouth (oral). ? Rectum (rectal). ? Ear (tympanic). ? Underarm (axillary). ? Forehead (temporal).  Follow these instructions at home: Pay attention to any changes in your symptoms. Take these actions to help with your condition:  Take over-the-counter and prescription medicines only as told by your doctor. Follow the dosing instructions carefully.  If you were prescribed an antibiotic medicine, take it as told by your doctor. Do not stop taking the antibiotic even if you start to feel better.  Rest as needed.  Drink enough fluid to keep your pee (urine) clear or pale yellow.  Sponge yourself or bathe with room-temperature water as needed. This helps to lower your body temperature . Do not use ice water.  Do not wear too many blankets or heavy clothes.  Contact a doctor if:  You throw up (vomit).  You cannot eat or drink without throwing up.  You have watery poop (diarrhea).  It hurts when you pee.  Your symptoms do not get better with treatment.  You have new symptoms.  You feel very weak. Get help right away if:  You are short of breath or have trouble breathing.  You are dizzy or you pass out (faint).  You feel confused.  You have signs of not having enough fluid in your body, such as: ? A dry  mouth. ? Peeing less. ? Looking pale.  You have very bad pain in your belly (abdomen).  You keep throwing up or having water poop.  You have a skin rash.  Your symptoms suddenly get worse. This information is not intended to replace advice given to you by your health care provider. Make sure you discuss any questions you have with your health care provider. Document Released: 12/20/2007 Document Revised: 08/18/2015 Document Reviewed: 05/06/2014 Elsevier Interactive Patient Education  2018 [employer] Inc.  

## 2016-12-13 NOTE — Progress Notes (Signed)
   Subjective:    Patient ID: Peter York, male    DOB: [birth year], 71 y.o.   MRN: [id number]  HPI Patient in the office with complaints of running a low-grade fever and feeling "achy all over".  He states he just has no energy and this started about 2-3 days ago.  Patient has taken Tylenol arthritis and this has improved symptoms.  Last night he awoke in the night covered in sweat.     Review of Systems  Constitutional: Positive for chills, diaphoresis (last night), fatigue and fever (feeling feverish).  HENT: Positive for rhinorrhea (x2 days, clear drainage). Negative for congestion, postnasal drip, sinus pain, sinus pressure, sneezing and sore throat.   Respiratory: Negative for cough, shortness of breath and wheezing.   Cardiovascular: Negative for chest pain and palpitations.  All other systems reviewed and are negative.      Objective:   Physical Exam  Constitutional: He is oriented to person, place, and time. He appears well-developed and well-nourished. No distress.  HENT:  Right Ear: External ear normal.  Left Ear: External ear normal.  Mouth/Throat: Oropharynx is clear and moist.  Eyes: Pupils are equal, round, and reactive to light.  Neck: Normal range of motion. Neck supple.  Cardiovascular: Normal rate, regular rhythm, normal heart sounds and intact distal pulses.   No murmur heard. Pulmonary/Chest: Effort normal and breath sounds normal. No respiratory distress. He has no wheezes.  Lymphadenopathy:    He has no cervical adenopathy.  Neurological: He is alert and oriented to person, place, and time.  Skin: Skin is warm and dry.  Psychiatric: He has a normal mood and affect. His behavior is normal.   BP 131/81   Pulse 78   Temp 98.9 F (37.2 C) (Oral)   Ht 5\' 8"  (1.727 m)   Wt 155 lb (70.3 kg)   BMI 23.57 kg/m     Assessment & Plan:  1. Fever, unspecified fever cause Force fluids Watch for return of fever Tylenol OTC for fever RTO prn  Mary-Margaret  Hassell Done, FNP

## 2016-12-17 ENCOUNTER — Telehealth: Payer: Self-pay | Admitting: Nurse Practitioner

## 2016-12-17 ENCOUNTER — Other Ambulatory Visit: Payer: Self-pay | Admitting: Nurse Practitioner

## 2016-12-17 MED ORDER — SITAGLIPTIN PHOSPHATE 50 MG PO TABS: 50.0000 mg | ORAL_TABLET | Freq: Every day | ORAL | 3 refills | Status: DC

## 2016-12-17 NOTE — Telephone Encounter (Signed)
Please review

## 2016-12-17 NOTE — Telephone Encounter (Signed)
Patient is wanting to go to [facility] for his Diabetes. Please advise

## 2016-12-17 NOTE — Telephone Encounter (Signed)
His last HGBA1c was 6.6- they will not do anything- that HGBAc1 is excellent

## 2016-12-18 NOTE — Telephone Encounter (Signed)
No answer, mailbox full

## 2017-01-14 ENCOUNTER — Encounter: Payer: Self-pay | Admitting: Family Medicine

## 2017-01-14 ENCOUNTER — Ambulatory Visit (INDEPENDENT_AMBULATORY_CARE_PROVIDER_SITE_OTHER): Payer: PPO | Admitting: Family Medicine

## 2017-01-14 VITALS — BP 150/83 | HR 62 | Temp 97.4°F | Ht 68.0 in | Wt 161.0 lb

## 2017-01-14 DIAGNOSIS — M25441 Effusion, right hand: Secondary | ICD-10-CM

## 2017-01-14 MED ORDER — PREDNISONE 10 MG PO TABS: ORAL_TABLET | ORAL | 0 refills | Status: DC

## 2017-01-14 NOTE — Progress Notes (Signed)
Subjective: WG:YKZLD finger swelling PCP: Chevis Pretty, FNP JTT:SVXBL Peter York is a 71 y.o. male presenting to clinic today for:  1. Finger swelling Patient reports acute onset of right finger swelling on Saturday morning.  He notes that a similar episode happened to his left middle finger several months ago, which resolved after prednisone taper.  He reports that on Friday afternoon, he was riding a horse and thinks that may be the rains were irritating the middle finger.  Denies joint swelling or joint pain elsewhere in the body.  No fevers, chills.  No numbness or tingling.  No preceding injury.  Allergies  Allergen Reactions  . Benazepril     hyperkalemia  . Crestor [Rosuvastatin Calcium]     "like to killed me." joint pain, unable to get OOB   Past Medical History:  Diagnosis Date  . Adenomatous colon polyp 12/31/2005  . Diabetes mellitus   . Hypercholesterolemia   . Hypertension    Family History  Problem Relation Age of Onset  . Colon cancer Father 17  . Colon polyps Father   . Cancer Father        Colon  . Heart attack Maternal Uncle        X 4  . Heart disease Maternal Uncle   . Heart disease Maternal Uncle   . Heart disease Maternal Uncle   . Diabetes Daughter     Current Outpatient Prescriptions:  .  alendronate (FOSAMAX) 70 MG tablet, Take 1 tablet (70 mg total) by mouth every 7 (seven) days. Take with a full glass of water on an empty stomach., Disp: 12 tablet, Rfl: 2 .  amLODipine (NORVASC) 10 MG tablet, Take 1 tablet (10 mg total) by mouth daily., Disp: 90 tablet, Rfl: 3 .  aspirin 81 MG tablet, Take 81 mg by mouth daily., Disp: , Rfl:  .  chlorthalidone (HYGROTON) 25 MG tablet, Take 1 tablet (25 mg total) by mouth daily., Disp: 90 tablet, Rfl: 3 .  cholecalciferol (VITAMIN D) 1000 units tablet, Take 1 tablet (1,000 Units total) by mouth daily., Disp: , Rfl:  .  cloNIDine (CATAPRES) 0.1 MG tablet, Take 1 tablet (0.1 mg total) by mouth 2 (two) times  daily., Disp: 180 tablet, Rfl: 3 .  glimepiride (AMARYL) 4 MG tablet, Take 1 tablet (4 mg total) by mouth daily with breakfast., Disp: 90 tablet, Rfl: 1 .  Omega-3 Fatty Acids (FISH OIL) 1000 MG CAPS, Take 2,000 mg by mouth 2 (two) times daily., Disp: , Rfl:  .  ONE TOUCH ULTRA TEST test strip, USE TO CHECK BLOOD SUGAR ONCE A DAY AS INSTRUCTED, Disp: 100 each, Rfl: 2 .  Pitavastatin Calcium (LIVALO) 4 MG TABS, Take 1 tablet (4 mg total) by mouth daily., Disp: 30 tablet, Rfl: 5 .  predniSONE (DELTASONE) 10 MG tablet, 60 mg day 1-2, 56m day 3-4, 453mday 5-6, 3025may 7-8, 25m42my 9-10, 10mg67m 11-12, Disp: 42 tablet, Rfl: 0 .  sitaGLIPtin (JANUVIA) 50 MG tablet, Take 1 tablet (50 mg total) by mouth daily., Disp: 30 tablet, Rfl: 3 .  vitamin E 400 UNIT capsule, Take 1 capsule (400 Units total) by mouth daily., Disp: 30 capsule, Rfl: 3  Social Hx: former smoker.  Health Maintenance: Flu shot   ROS: Per HPI  Objective: Office vital signs reviewed. BP (!) 150/83   Pulse 62   Temp (!) 97.4 F (36.3 C) (Oral)   Ht 5' 8"  (1.727 m)   Wt 161 lb (73  kg)   BMI 24.48 kg/m   Physical Examination:  General: Awake, alert, well nourished, well appearing elderly male, No acute distress Extremities: warm, well perfused, no edema/ clubbing, +2 pulses bilaterally  Right hand: Mild arthritic changes appreciated.  Middle finger with moderate joint swelling in the DIP and MIP joints.  Mild erythema seen at the DIP.  No increased warmth.  Patient has full active range of motion of all joints in the hand and fingers.  Good hand grip.  Light touch sensation intact.  Left hand: Mild arthritic changes appreciated.  No overt joint swelling appreciated.  Has full active range of motion of all joints in the hand and fingers.  Good hand grip.  Light touch sensation intact. Neuro: Light touch sensation intact.  Follows commands.  Assessment/ Plan: 71 y.o. male   1. Finger joint swelling, right Likely  secondary to recent horseback riding.  I suspect that the reigns likely were irritating to his hand and subsequently caused swelling in his finger.  However, given the history of swelling in the opposite hand of the same finger, will rule out possible autoimmune process.  No evidence of septic joint on exam.  Could consider gout flare but again clinical picture not entirely consistent with that diagnosis.  Rheumatoid factor, C-reactive protein ESR and CBC were ordered.  Last CMP was reviewed, creatinine was 1.56 at that time with estimated GFR of 44.  Because of his history of renal disease, will treat with an oral steroid rather than an NSAID.  12-day taper was provided today.  Will call patient with results and refer if clinically indicated.  Strict return precautions were reviewed with the patient.  He voiced good understanding.  Patient to follow-up as needed. - Rheumatoid factor - C-reactive protein - Sedimentation rate - CBC with Differential/Platelet   Orders Placed This Encounter  Procedures  . Rheumatoid factor  . C-reactive protein  . Sedimentation rate  . CBC with Differential/Platelet   Meds ordered this encounter  Medications  . predniSONE (DELTASONE) 10 MG tablet    Sig: 60 mg day 1-2, 23m day 3-4, 476mday 5-6, 304may 7-8, 68m34my 9-10, 10mg12m 11-12    Dispense:  42 tablet    Refill:  0     Ashly M GotWindell MouldingWesteBenzie)[phone]

## 2017-01-15 LAB — CBC WITH DIFFERENTIAL/PLATELET
BASOS ABS: 0.1 10*3/uL (ref 0.0–0.2)
BASOS: 1 %
EOS (ABSOLUTE): 0.3 10*3/uL (ref 0.0–0.4)
Eos: 4 %
HEMOGLOBIN: 11.5 g/dL — AB (ref 13.0–17.7)
Hematocrit: 33.8 % — ABNORMAL LOW (ref 37.5–51.0)
Immature Grans (Abs): 0 10*3/uL (ref 0.0–0.1)
Immature Granulocytes: 1 %
LYMPHS ABS: 1.3 10*3/uL (ref 0.7–3.1)
LYMPHS: 20 %
MCH: 30 pg (ref 26.6–33.0)
MCHC: 34 g/dL (ref 31.5–35.7)
MCV: 88 fL (ref 79–97)
Monocytes Absolute: 0.3 10*3/uL (ref 0.1–0.9)
Monocytes: 5 %
NEUTROS ABS: 4.4 10*3/uL (ref 1.4–7.0)
NEUTROS PCT: 69 %
PLATELETS: 260 10*3/uL (ref 150–379)
RBC: 3.83 x10E6/uL — ABNORMAL LOW (ref 4.14–5.80)
RDW: 14.3 % (ref 12.3–15.4)
WBC: 6.3 10*3/uL (ref 3.4–10.8)

## 2017-01-15 LAB — C-REACTIVE PROTEIN: CRP: 6.8 mg/L — AB (ref 0.0–4.9)

## 2017-01-15 LAB — SEDIMENTATION RATE: SED RATE: 11 mm/h (ref 0–30)

## 2017-01-15 LAB — RHEUMATOID FACTOR

## 2017-02-08 ENCOUNTER — Other Ambulatory Visit: Payer: Self-pay | Admitting: *Deleted

## 2017-02-08 MED ORDER — SITAGLIPTIN PHOSPHATE 50 MG PO TABS: 50.0000 mg | ORAL_TABLET | Freq: Every day | ORAL | 0 refills | Status: DC

## 2017-02-20 DIAGNOSIS — R972 Elevated prostate specific antigen [PSA]: Secondary | ICD-10-CM | POA: Diagnosis not present

## 2017-02-26 ENCOUNTER — Encounter: Payer: Self-pay | Admitting: Nurse Practitioner

## 2017-02-26 ENCOUNTER — Ambulatory Visit: Payer: PPO | Admitting: Nurse Practitioner

## 2017-02-26 VITALS — BP 131/80 | HR 57 | Temp 97.1°F | Ht 68.0 in | Wt 162.0 lb

## 2017-02-26 DIAGNOSIS — S22000D Wedge compression fracture of unspecified thoracic vertebra, subsequent encounter for fracture with routine healing: Secondary | ICD-10-CM | POA: Diagnosis not present

## 2017-02-26 DIAGNOSIS — E119 Type 2 diabetes mellitus without complications: Secondary | ICD-10-CM | POA: Diagnosis not present

## 2017-02-26 DIAGNOSIS — I10 Essential (primary) hypertension: Secondary | ICD-10-CM

## 2017-02-26 DIAGNOSIS — M810 Age-related osteoporosis without current pathological fracture: Secondary | ICD-10-CM

## 2017-02-26 DIAGNOSIS — E785 Hyperlipidemia, unspecified: Secondary | ICD-10-CM | POA: Diagnosis not present

## 2017-02-26 DIAGNOSIS — E782 Mixed hyperlipidemia: Secondary | ICD-10-CM

## 2017-02-26 LAB — CMP14+EGFR
ALK PHOS: 62 IU/L (ref 39–117)
ALT: 21 IU/L (ref 0–44)
AST: 23 IU/L (ref 0–40)
Albumin/Globulin Ratio: 2 (ref 1.2–2.2)
Albumin: 4.5 g/dL (ref 3.5–4.8)
BILIRUBIN TOTAL: 0.3 mg/dL (ref 0.0–1.2)
BUN/Creatinine Ratio: 16 (ref 10–24)
BUN: 23 mg/dL (ref 8–27)
CHLORIDE: 102 mmol/L (ref 96–106)
CO2: 23 mmol/L (ref 20–29)
Calcium: 8.9 mg/dL (ref 8.6–10.2)
Creatinine, Ser: 1.48 mg/dL — ABNORMAL HIGH (ref 0.76–1.27)
GFR calc non Af Amer: 47 mL/min/{1.73_m2} — ABNORMAL LOW (ref >59)
GFR, EST AFRICAN AMERICAN: 54 mL/min/{1.73_m2} — AB (ref >59)
GLUCOSE: 174 mg/dL — AB (ref 65–99)
Globulin, Total: 2.3 g/dL (ref 1.5–4.5)
Potassium: 4.3 mmol/L (ref 3.5–5.2)
Sodium: 138 mmol/L (ref 134–144)
TOTAL PROTEIN: 6.8 g/dL (ref 6.0–8.5)

## 2017-02-26 LAB — LIPID PANEL
CHOLESTEROL TOTAL: 236 mg/dL — AB (ref 100–199)
Chol/HDL Ratio: 7.9 ratio — ABNORMAL HIGH (ref 0.0–5.0)
HDL: 30 mg/dL — ABNORMAL LOW (ref >39)
LDL Calculated: 170 mg/dL — ABNORMAL HIGH (ref 0–99)
Triglycerides: 178 mg/dL — ABNORMAL HIGH (ref 0–149)
VLDL CHOLESTEROL CAL: 36 mg/dL (ref 5–40)

## 2017-02-26 LAB — BAYER DCA HB A1C WAIVED: HB A1C: 7.5 % — AB (ref <7.0)

## 2017-02-26 MED ORDER — SITAGLIPTIN PHOSPHATE 50 MG PO TABS
50.0000 mg | ORAL_TABLET | Freq: Every day | ORAL | 1 refills | Status: DC
Start: 2017-02-26 — End: 2017-02-26

## 2017-02-26 MED ORDER — SITAGLIPTIN PHOSPHATE 100 MG PO TABS: 100.0000 mg | ORAL_TABLET | Freq: Every day | ORAL | 1 refills | Status: DC

## 2017-02-26 NOTE — Patient Instructions (Signed)
Bone Health Bones protect organs, store calcium, and anchor muscles. Good health habits, such as eating nutritious foods and exercising regularly, are important for maintaining healthy bones. They can also help to prevent a condition that causes bones to lose density and become weak and brittle (osteoporosis). Why is bone mass important? Bone mass refers to the amount of bone tissue that you have. The higher your bone mass, the stronger your bones. An important step toward having healthy bones throughout life is to have strong and dense bones during childhood. A young adult who has a high bone mass is more likely to have a high bone mass later in life. Bone mass at its greatest it is called peak bone mass. A large decline in bone mass occurs in older adults. In women, it occurs about the time of menopause. During this time, it is important to practice good health habits, because if more bone is lost than what is replaced, the bones will become less healthy and more likely to break (fracture). If you find that you have a low bone mass, you may be able to prevent osteoporosis or further bone loss by changing your diet and lifestyle. How can I find out if my bone mass is low? Bone mass can be measured with an X-ray test that is called a bone mineral density (BMD) test. This test is recommended for all women who are age 65 or older. It may also be recommended for men who are age 70 or older, or for people who are more likely to develop osteoporosis due to:  Having bones that break easily.  Having a long-term disease that weakens bones, such as kidney disease or rheumatoid arthritis.  Having menopause earlier than normal.  Taking medicine that weakens bones, such as steroids, thyroid hormones, or hormone treatment for breast cancer or prostate cancer.  Smoking.  Drinking three or more alcoholic drinks each day.  What are the nutritional recommendations for healthy bones? To have healthy bones, you  need to get enough of the right minerals and vitamins. Most nutrition experts recommend getting these nutrients from the foods that you eat. Nutritional recommendations vary from person to person. Ask your health care provider what is healthy for you. Here are some general guidelines. Calcium Recommendations Calcium is the most important (essential) mineral for bone health. Most people can get enough calcium from their diet, but supplements may be recommended for people who are at risk for osteoporosis. Good sources of calcium include:  Dairy products, such as low-fat or nonfat milk, cheese, and yogurt.  Dark green leafy vegetables, such as bok choy and broccoli.  Calcium-fortified foods, such as orange juice, cereal, bread, soy beverages, and tofu products.  Nuts, such as almonds.  Follow these recommended amounts for daily calcium intake:  Children, age 1?3: 700 mg.  Children, age 4?8: 1,000 mg.  Children, age 9?13: 1,300 mg.  Teens, age 14?18: 1,300 mg.  Adults, age 19?50: 1,000 mg.  Adults, age 51?70: ? Men: 1,000 mg. ? Women: 1,200 mg.  Adults, age 71 or older: 1,200 mg.  Pregnant and breastfeeding females: ? Teens: 1,300 mg. ? Adults: 1,000 mg.  Vitamin D Recommendations Vitamin D is the most essential vitamin for bone health. It helps the body to absorb calcium. Sunlight stimulates the skin to make vitamin D, so be sure to get enough sunlight. If you live in a cold climate or you do not get outside often, your health care provider may recommend that you take vitamin   D supplements. Good sources of vitamin D in your diet include:  Egg yolks.  Saltwater fish.  Milk and cereal fortified with vitamin D.  Follow these recommended amounts for daily vitamin D intake:  Children and teens, age 1?18: 600 international units.  Adults, age 50 or younger: 400-800 international units.  Adults, age 51 or older: 800-1,000 international units.  Other Nutrients Other nutrients  for bone health include:  Phosphorus. This mineral is found in meat, poultry, dairy foods, nuts, and legumes. The recommended daily intake for adult men and adult women is 700 mg.  Magnesium. This mineral is found in seeds, nuts, dark green vegetables, and legumes. The recommended daily intake for adult men is 400?420 mg. For adult women, it is 310?320 mg.  Vitamin K. This vitamin is found in green leafy vegetables. The recommended daily intake is 120 mg for adult men and 90 mg for adult women.  What type of physical activity is best for building and maintaining healthy bones? Weight-bearing and strength-building activities are important for building and maintaining peak bone mass. Weight-bearing activities cause muscles and bones to work against gravity. Strength-building activities increases muscle strength that supports bones. Weight-bearing and muscle-building activities include:  Walking and hiking.  Jogging and running.  Dancing.  Gym exercises.  Lifting weights.  Tennis and racquetball.  Climbing stairs.  Aerobics.  Adults should get at least 30 minutes of moderate physical activity on most days. Children should get at least 60 minutes of moderate physical activity on most days. Ask your health care provide what type of exercise is best for you. Where can I find more information? For more information, check out the following websites:  National Osteoporosis Foundation: http://nof.org/learn/basics  National Institutes of Health: http://www.niams.nih.gov/Health_Info/Bone/Bone_Health/bone_health_for_life.asp  This information is not intended to replace advice given to you by your health care provider. Make sure you discuss any questions you have with your health care provider. Document Released: 06/02/2003 Document Revised: 09/30/2015 Document Reviewed: 03/17/2014 Elsevier Interactive Patient Education  2018 [employer].  

## 2017-02-26 NOTE — Progress Notes (Signed)
Subjective:    Patient ID: Peter York, male    DOB: December 11, 1945, 71 y.o.   MRN: [id number]  HPI  York Peter is here today for follow up of chronic medical problem.  Outpatient Encounter Medications as of 02/26/2017  Medication Sig  . alendronate (FOSAMAX) 70 MG tablet Take 1 tablet (70 mg total) by mouth every 7 (seven) days. Take with a full glass of water on an empty stomach.  [name] amLODipine (NORVASC) 10 MG tablet Take 1 tablet (10 mg total) by mouth daily.  [name] aspirin 81 MG tablet Take 81 mg by mouth daily.  . chlorthalidone (HYGROTON) 25 MG tablet Take 1 tablet (25 mg total) by mouth daily.  . cholecalciferol (VITAMIN D) 1000 units tablet Take 1 tablet (1,000 Units total) by mouth daily.  . cloNIDine (CATAPRES) 0.1 MG tablet Take 1 tablet (0.1 mg total) by mouth 2 (two) times daily.  [name] glimepiride (AMARYL) 4 MG tablet Take 1 tablet (4 mg total) by mouth daily with breakfast.  . Omega-3 Fatty Acids (FISH OIL) 1000 MG CAPS Take 2,000 mg by mouth 2 (two) times daily.  . ONE TOUCH ULTRA TEST test strip USE TO CHECK BLOOD SUGAR ONCE A DAY AS INSTRUCTED  . Pitavastatin Calcium (LIVALO) 4 MG TABS Take 1 tablet (4 mg total) by mouth daily.  . predniSONE (DELTASONE) 10 MG tablet 60 mg day 1-2, 13m day 3-4, 426mday 5-6, 3026may 7-8, 10m22my 9-10, 10mg25m 11-12  . sitaGLIPtin (JANUVIA) 50 MG tablet Take 1 tablet (50 mg total) daily by mouth.  . vitamin E 400 UNIT capsule Take 1 capsule (400 Units total) by mouth daily.     1. Essential hypertension  No c/o chest pain, sob or headaches. Does not check blood pressure at home. BP Readings from Last 3 Encounters:  02/26/17 131/80  01/14/17 (!) 150/83  12/13/16 131/81    2. Type 2 diabetes mellitus without complication, without long-term current use of insulin (HCC) [name] hgba1c was 6.6. Fasting blood sugars in the morning are around 190 but will decrease during the day.  3. Hyperlipidemia with target LDL less than 100  Tries to avoid  fried foods  4. Osteoporosis of vertebra  Does a lot of walking  5. 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  HENT: Negative.   Eyes: Negative for pain.  Respiratory: Negative for shortness of breath.   Cardiovascular: Negative for chest pain, palpitations and leg swelling.  Gastrointestinal: Negative for abdominal pain.  Endocrine: Negative for polydipsia.  Genitourinary: Negative.   Skin: Negative for rash.  Neurological: Negative for dizziness, weakness and headaches.  Hematological: Does not bruise/bleed easily.  Psychiatric/Behavioral: Negative.   All other systems reviewed and are negative.      Objective:   Physical Exam  Constitutional: He is oriented to person, place, and time. He appears well-developed and well-nourished.  HENT:  Head: Normocephalic.  Right Ear: External ear normal.  Left Ear: External ear normal.  Nose: Nose normal.  Mouth/Throat: Oropharynx is clear and moist.  Eyes: EOM are normal. Pupils are equal, round, and reactive to light.  Neck: Normal range of motion. Neck supple. No JVD present. No thyromegaly present.  Cardiovascular: Normal rate, regular  rhythm, normal heart sounds and intact distal pulses. Exam reveals no gallop and no friction rub.  No murmur heard. Pulmonary/Chest: Effort normal and breath sounds normal. No respiratory distress. He has no wheezes. He has no rales. He exhibits no tenderness.  Abdominal: Soft. Bowel sounds are normal. He exhibits no mass. There is no tenderness.  Genitourinary: Prostate normal and penis normal.  Musculoskeletal: Normal range of motion. He exhibits no edema.  Lymphadenopathy:    He has no cervical adenopathy.    Neurological: He is alert and oriented to person, place, and time. No cranial nerve deficit.  Skin: Skin is warm and dry.  Psychiatric: He has a normal mood and affect. His behavior is normal. Judgment and thought content normal.   BP 131/80   Pulse (!) 57   Temp (!) 97.1 F (36.2 C) (Oral)   Ht 5' 8"  (1.727 m)   Wt 162 lb (73.5 kg)   BMI 24.63 kg/m   hgba1c 7.5     Assessment & Plan:  1. Essential hypertension Low sodium diet - CMP14+EGFR  2. Type 2 diabetes mellitus without complication, without long-term current use of insulin (HCC) Increase januvia to 135m daily - Bayer DCA Hb A1c Waived - sitaGLIPtin (JANUVIA) 100 MG tablet; Take 1 tablet (100 mg total) by mouth daily.  Dispense: 90 tablet; Refill: 1  3. Hyperlipidemia with target LDL less than 100 Low fat diet - Lipid panel  4. Osteoporosis of vertebra Weight bearing exercise  5. Closed compression fracture of thoracic vertebra with routine healing, subsequent encounter   Labs pending Health maintenance reviewed Diet and exercise encouraged Continue all meds Follow up  In 3 months   M[place] FNP

## 2017-02-27 DIAGNOSIS — N400 Enlarged prostate without lower urinary tract symptoms: Secondary | ICD-10-CM | POA: Diagnosis not present

## 2017-02-27 DIAGNOSIS — R972 Elevated prostate specific antigen [PSA]: Secondary | ICD-10-CM | POA: Diagnosis not present

## 2017-02-28 ENCOUNTER — Other Ambulatory Visit: Payer: Self-pay | Admitting: *Deleted

## 2017-02-28 MED ORDER — GLUCOSE BLOOD VI STRP: ORAL_STRIP | 2 refills | Status: DC

## 2017-05-08 ENCOUNTER — Other Ambulatory Visit: Payer: Self-pay | Admitting: Nurse Practitioner

## 2017-05-08 DIAGNOSIS — E119 Type 2 diabetes mellitus without complications: Secondary | ICD-10-CM

## 2017-05-10 ENCOUNTER — Encounter: Payer: Self-pay | Admitting: Family Medicine

## 2017-05-10 ENCOUNTER — Ambulatory Visit (INDEPENDENT_AMBULATORY_CARE_PROVIDER_SITE_OTHER): Payer: PPO | Admitting: Family Medicine

## 2017-05-10 VITALS — BP 128/71 | HR 71 | Temp 97.1°F | Ht 68.0 in | Wt 165.1 lb

## 2017-05-10 DIAGNOSIS — M1990 Unspecified osteoarthritis, unspecified site: Secondary | ICD-10-CM | POA: Diagnosis not present

## 2017-05-10 MED ORDER — PREDNISONE 10 MG PO TABS: ORAL_TABLET | ORAL | 0 refills | Status: DC

## 2017-05-10 NOTE — Progress Notes (Signed)
Subjective:  Patient ID: OMEGA SLAGER, male    DOB: [birth year]  Age: 72 y.o. MRN: [id number]  CC: Hand Pain (pt here today c/o right middle finger red swollen )   HPI Peter York presents for onset 1/2 weeks ago of pain in the right middle finger.  Pain is severe thumping located at the DIP of the third right finger.  Getting worse instead of better.  He says that it started when he was running some p.o. trials with her dogs and putting that hand under the collar holding them and feels like that irritated.  Depression screen [facility] 2/9 02/26/2017 01/14/2017 12/13/2016  Decreased Interest 0 0 0  Down, Depressed, Hopeless 0 0 0  PHQ - 2 Score 0 0 0    History Alfonse has a past medical history of Adenomatous colon polyp (12/31/2005), Diabetes mellitus, Hypercholesterolemia, and Hypertension.   He has a past surgical history that includes Colonoscopy w/ polypectomy (12/31/2005); Appendectomy; Umbilical hernia repair; and Anal fistulectomy (10/02/2011).   His family history includes Cancer in his father; Colon cancer (age of onset: 28) in his father; Colon polyps in his father; Diabetes in his daughter; Heart attack in his maternal uncle; Heart disease in his maternal uncle, maternal uncle, and maternal uncle.He reports that he quit smoking about 39 years ago. he has never used smokeless tobacco. He reports that he drinks alcohol. He reports that he does not use drugs.    ROS Review of Systems  Constitutional: Negative for activity change, appetite change and fever.    Objective:  BP 128/71   Pulse 71   Temp (!) 97.1 F (36.2 C) (Oral)   Ht 5\' 8"  (1.727 m)   Wt 165 lb 2 oz (74.9 kg)   BMI 25.11 kg/m   BP Readings from Last 3 Encounters:  05/10/17 128/71  02/26/17 131/80  01/14/17 (!) 150/83    Wt Readings from Last 3 Encounters:  05/10/17 165 lb 2 oz (74.9 kg)  02/26/17 162 lb (73.5 kg)  01/14/17 161 lb (73 kg)     Physical Exam  Skin: Skin is warm and dry. There is  erythema (Moderate erythema with swelling limited to the dorsum of the right third finger surrounding the DIP approximately 2 x 2 cm.  Some Heberden's nodes noted as well).      Assessment & Plan:   Germain was seen today for hand pain.  Diagnoses and all orders for this visit:  Inflammatory arthritis  Joint inflammation -     Uric acid  Other orders -     predniSONE (DELTASONE) 10 MG tablet; Take 5 PO x 3 days, then 4 PO x 3 days, then 3 PO x 3 days, then 2 PO x 3 days then 1 PO x 3 days.       I have discontinued Tadd Holtmeyer. [place]'s predniSONE. I am also having him start on predniSONE. Additionally, I am having him maintain his aspirin, Fish Oil, vitamin E, cholecalciferol, alendronate, chlorthalidone, amLODipine, cloNIDine, Pitavastatin Calcium, sitaGLIPtin, glucose blood, and glimepiride.  Allergies as of 05/10/2017      Reactions   Benazepril    hyperkalemia   Crestor [rosuvastatin Calcium]    "like to killed me." joint pain, unable to get OOB      Medication List        Accurate as of 05/10/17  8:24 AM. Always use your most recent med list.          alendronate 70 MG  tablet Commonly known as:  FOSAMAX Take 1 tablet (70 mg total) by mouth every 7 (seven) days. Take with a full glass of water on an empty stomach.   amLODipine 10 MG tablet Commonly known as:  NORVASC Take 1 tablet (10 mg total) by mouth daily.   aspirin 81 MG tablet Take 81 mg by mouth daily.   chlorthalidone 25 MG tablet Commonly known as:  HYGROTON Take 1 tablet (25 mg total) by mouth daily.   cholecalciferol 1000 units tablet Commonly known as:  VITAMIN D Take 1 tablet (1,000 Units total) by mouth daily.   cloNIDine 0.1 MG tablet Commonly known as:  CATAPRES Take 1 tablet (0.1 mg total) by mouth 2 (two) times daily.   Fish Oil 1000 MG Caps Take 2,000 mg by mouth 2 (two) times daily.   glimepiride 4 MG tablet Commonly known as:  AMARYL TAKE 1 TABLET (4 MG TOTAL) BY MOUTH DAILY WITH  BREAKFAST.   glucose blood test strip Commonly known as:  ONE TOUCH ULTRA TEST USE TO CHECK BLOOD SUGAR ONCE A DAY AS INSTRUCTED   Pitavastatin Calcium 4 MG Tabs Commonly known as:  LIVALO Take 1 tablet (4 mg total) by mouth daily.   predniSONE 10 MG tablet Commonly known as:  DELTASONE Take 5 PO x 3 days, then 4 PO x 3 days, then 3 PO x 3 days, then 2 PO x 3 days then 1 PO x 3 days.   sitaGLIPtin 100 MG tablet Commonly known as:  JANUVIA Take 1 tablet (100 mg total) by mouth daily.   vitamin E 400 UNIT capsule Take 1 capsule (400 Units total) by mouth daily.        Follow-up: Return if symptoms worsen or fail to improve.  Claretta Fraise, M.D.

## 2017-05-11 LAB — URIC ACID: Uric Acid: 8.1 mg/dL (ref 3.7–8.6)

## 2017-05-29 ENCOUNTER — Telehealth: Payer: Self-pay | Admitting: *Deleted

## 2017-05-29 NOTE — Telephone Encounter (Signed)
Forward to PCP.

## 2017-05-29 NOTE — Telephone Encounter (Signed)
Patient has pain again in the joint of his finger.  The prednisone worked well with it before but he would like to have a medicine sent in that would be available when his gout pain happens.  Please send to [facility].

## 2017-05-30 MED ORDER — COLCHICINE 0.6 MG PO TABS: ORAL_TABLET | ORAL | 0 refills | Status: DC

## 2017-05-30 NOTE — Telephone Encounter (Signed)
Colchicine rx sent to pharmacy for his gout. Make sure undertsands directions on how to take. NO MORE THEN 3 TABLETS IN 24 hours.

## 2017-05-30 NOTE — Telephone Encounter (Signed)
See below- forgot to send to [facility] with response

## 2017-05-30 NOTE — Addendum Note (Signed)
Addended by: Chevis Pretty on: 05/30/2017 07:50 AM   Modules accepted: Orders

## 2017-05-30 NOTE — Telephone Encounter (Signed)
Patient aware that medication has been sent to pharmacy 

## 2017-06-04 ENCOUNTER — Ambulatory Visit (INDEPENDENT_AMBULATORY_CARE_PROVIDER_SITE_OTHER): Payer: PPO | Admitting: Nurse Practitioner

## 2017-06-04 ENCOUNTER — Encounter: Payer: Self-pay | Admitting: Nurse Practitioner

## 2017-06-04 VITALS — BP 148/80 | HR 58 | Temp 96.9°F | Ht 68.0 in | Wt 163.0 lb

## 2017-06-04 DIAGNOSIS — I10 Essential (primary) hypertension: Secondary | ICD-10-CM | POA: Diagnosis not present

## 2017-06-04 DIAGNOSIS — E119 Type 2 diabetes mellitus without complications: Secondary | ICD-10-CM

## 2017-06-04 DIAGNOSIS — E782 Mixed hyperlipidemia: Secondary | ICD-10-CM | POA: Diagnosis not present

## 2017-06-04 DIAGNOSIS — S22000D Wedge compression fracture of unspecified thoracic vertebra, subsequent encounter for fracture with routine healing: Secondary | ICD-10-CM | POA: Diagnosis not present

## 2017-06-04 DIAGNOSIS — R634 Abnormal weight loss: Secondary | ICD-10-CM

## 2017-06-04 DIAGNOSIS — M810 Age-related osteoporosis without current pathological fracture: Secondary | ICD-10-CM

## 2017-06-04 LAB — CMP14+EGFR
A/G RATIO: 1.6 (ref 1.2–2.2)
ALK PHOS: 55 IU/L (ref 39–117)
ALT: 13 IU/L (ref 0–44)
AST: 14 IU/L (ref 0–40)
Albumin: 4.2 g/dL (ref 3.5–4.8)
BUN / CREAT RATIO: 18 (ref 10–24)
BUN: 29 mg/dL — AB (ref 8–27)
Bilirubin Total: 0.3 mg/dL (ref 0.0–1.2)
CHLORIDE: 101 mmol/L (ref 96–106)
CO2: 22 mmol/L (ref 20–29)
Calcium: 9 mg/dL (ref 8.6–10.2)
Creatinine, Ser: 1.62 mg/dL — ABNORMAL HIGH (ref 0.76–1.27)
GFR calc Af Amer: 48 mL/min/{1.73_m2} — ABNORMAL LOW (ref >59)
GFR calc non Af Amer: 42 mL/min/{1.73_m2} — ABNORMAL LOW (ref >59)
GLUCOSE: 165 mg/dL — AB (ref 65–99)
Globulin, Total: 2.7 g/dL (ref 1.5–4.5)
POTASSIUM: 4.5 mmol/L (ref 3.5–5.2)
Sodium: 139 mmol/L (ref 134–144)
Total Protein: 6.9 g/dL (ref 6.0–8.5)

## 2017-06-04 LAB — LIPID PANEL
CHOLESTEROL TOTAL: 240 mg/dL — AB (ref 100–199)
Chol/HDL Ratio: 8.3 ratio — ABNORMAL HIGH (ref 0.0–5.0)
HDL: 29 mg/dL — AB (ref >39)
LDL Calculated: 171 mg/dL — ABNORMAL HIGH (ref 0–99)
Triglycerides: 199 mg/dL — ABNORMAL HIGH (ref 0–149)
VLDL CHOLESTEROL CAL: 40 mg/dL (ref 5–40)

## 2017-06-04 LAB — BAYER DCA HB A1C WAIVED: HB A1C: 7.2 % — AB (ref <7.0)

## 2017-06-04 MED ORDER — METFORMIN HCL 500 MG PO TABS: 500.0000 mg | ORAL_TABLET | Freq: Two times a day (BID) | ORAL | 1 refills | Status: DC

## 2017-06-04 MED ORDER — PITAVASTATIN CALCIUM 4 MG PO TABS: 1.0000 | ORAL_TABLET | Freq: Every day | ORAL | 5 refills | Status: DC

## 2017-06-04 MED ORDER — GLIMEPIRIDE 4 MG PO TABS: 4.0000 mg | ORAL_TABLET | Freq: Every day | ORAL | 0 refills | Status: DC

## 2017-06-04 NOTE — Patient Instructions (Signed)
Diabetes Mellitus and Nutrition When you have diabetes (diabetes mellitus), it is very important to have healthy eating habits because your blood sugar (glucose) levels are greatly affected by what you eat and drink. Eating healthy foods in the appropriate amounts, at about the same times every day, can help you:  Control your blood glucose.  Lower your risk of heart disease.  Improve your blood pressure.  Reach or maintain a healthy weight.  Every person with diabetes is different, and each person has different needs for a meal plan. Your health care provider may recommend that you work with a diet and nutrition specialist (dietitian) to make a meal plan that is best for you. Your meal plan may vary depending on factors such as:  The calories you need.  The medicines you take.  Your weight.  Your blood glucose, blood pressure, and cholesterol levels.  Your activity level.  Other health conditions you have, such as heart or kidney disease.  How do carbohydrates affect me? Carbohydrates affect your blood glucose level more than any other type of food. Eating carbohydrates naturally increases the amount of glucose in your blood. Carbohydrate counting is a method for keeping track of how many carbohydrates you eat. Counting carbohydrates is important to keep your blood glucose at a healthy level, especially if you use insulin or take certain oral diabetes medicines. It is important to know how many carbohydrates you can safely have in each meal. This is different for every person. Your dietitian can help you calculate how many carbohydrates you should have at each meal and for snack. Foods that contain carbohydrates include:  Bread, cereal, rice, pasta, and crackers.  Potatoes and corn.  Peas, beans, and lentils.  Milk and yogurt.  Fruit and juice.  Desserts, such as cakes, cookies, ice cream, and candy.  How does alcohol affect me? Alcohol can cause a sudden decrease in blood  glucose (hypoglycemia), especially if you use insulin or take certain oral diabetes medicines. Hypoglycemia can be a life-threatening condition. Symptoms of hypoglycemia (sleepiness, dizziness, and confusion) are similar to symptoms of having too much alcohol. If your health care provider says that alcohol is safe for you, follow these guidelines:  Limit alcohol intake to no more than 1 drink per day for nonpregnant women and 2 drinks per day for men. One drink equals 12 oz of beer, 5 oz of wine, or 1 oz of hard liquor.  Do not drink on an empty stomach.  Keep yourself hydrated with water, diet soda, or unsweetened iced tea.  Keep in mind that regular soda, juice, and other mixers may contain a lot of sugar and must be counted as carbohydrates.  What are tips for following this plan? Reading food labels  Start by checking the serving size on the label. The amount of calories, carbohydrates, fats, and other nutrients listed on the label are based on one serving of the food. Many foods contain more than one serving per package.  Check the total grams (g) of carbohydrates in one serving. You can calculate the number of servings of carbohydrates in one serving by dividing the total carbohydrates by 15. For example, if a food has 30 g of total carbohydrates, it would be equal to 2 servings of carbohydrates.  Check the number of grams (g) of saturated and trans fats in one serving. Choose foods that have low or no amount of these fats.  Check the number of milligrams (mg) of sodium in one serving. Most people   should limit total sodium intake to less than 2,300 mg per day.  Always check the nutrition information of foods labeled as "low-fat" or "nonfat". These foods may be higher in added sugar or refined carbohydrates and should be avoided.  Talk to your dietitian to identify your daily goals for nutrients listed on the label. Shopping  Avoid buying canned, premade, or processed foods. These  foods tend to be high in fat, sodium, and added sugar.  Shop around the outside edge of the grocery store. This includes fresh fruits and vegetables, bulk grains, fresh meats, and fresh dairy. Cooking  Use low-heat cooking methods, such as baking, instead of high-heat cooking methods like deep frying.  Cook using healthy oils, such as olive, canola, or sunflower oil.  Avoid cooking with butter, cream, or high-fat meats. Meal planning  Eat meals and snacks regularly, preferably at the same times every day. Avoid going long periods of time without eating.  Eat foods high in fiber, such as fresh fruits, vegetables, beans, and whole grains. Talk to your dietitian about how many servings of carbohydrates you can eat at each meal.  Eat 4-6 ounces of lean protein each day, such as lean meat, chicken, fish, eggs, or tofu. 1 ounce is equal to 1 ounce of meat, chicken, or fish, 1 egg, or 1/4 cup of tofu.  Eat some foods each day that contain healthy fats, such as avocado, nuts, seeds, and fish. Lifestyle   Check your blood glucose regularly.  Exercise at least 30 minutes 5 or more days each week, or as told by your health care provider.  Take medicines as told by your health care provider.  Do not use any products that contain nicotine or tobacco, such as cigarettes and e-cigarettes. If you need help quitting, ask your health care provider.  Work with a counselor or diabetes educator to identify strategies to manage stress and any emotional and social challenges. What are some questions to ask my health care provider?  Do I need to meet with a diabetes educator?  Do I need to meet with a dietitian?  What number can I call if I have questions?  When are the best times to check my blood glucose? Where to find more information:  American Diabetes Association: diabetes.org/food-and-fitness/food  Academy of Nutrition and Dietetics:  www.eatright.org/resources/health/diseases-and-conditions/diabetes  National Institute of Diabetes and Digestive and Kidney Diseases (NIH): www.niddk.nih.gov/health-information/diabetes/overview/diet-eating-physical-activity Summary  A healthy meal plan will help you control your blood glucose and maintain a healthy lifestyle.  Working with a diet and nutrition specialist (dietitian) can help you make a meal plan that is best for you.  Keep in mind that carbohydrates and alcohol have immediate effects on your blood glucose levels. It is important to count carbohydrates and to use alcohol carefully. This information is not intended to replace advice given to you by your health care provider. Make sure you discuss any questions you have with your health care provider. Document Released: 12/07/2004 Document Revised: 04/16/2016 Document Reviewed: 04/16/2016 Elsevier Interactive Patient Education  2018 Elsevier Inc.  

## 2017-06-04 NOTE — Progress Notes (Signed)
Subjective:    Patient ID: Peter York, male    DOB: February 12, 1946, 72 y.o.   MRN: [id number]  HPI  Peter York is here today for follow up of chronic medical problem.  Outpatient Encounter Medications as of 06/04/2017  Medication Sig  . alendronate (FOSAMAX) 70 MG tablet Take 1 tablet (70 mg total) by mouth every 7 (seven) days. Take with a full glass of water on an empty stomach.  [name] amLODipine (NORVASC) 10 MG tablet Take 1 tablet (10 mg total) by mouth daily.  [name] aspirin 81 MG tablet Take 81 mg by mouth daily.  . chlorthalidone (HYGROTON) 25 MG tablet Take 1 tablet (25 mg total) by mouth daily.  . cholecalciferol (VITAMIN D) 1000 units tablet Take 1 tablet (1,000 Units total) by mouth daily.  . cloNIDine (CATAPRES) 0.1 MG tablet Take 1 tablet (0.1 mg total) by mouth 2 (two) times daily.  . colchicine 0.6 MG tablet 2 po at [place] onset. May repeat 1 tablet 2 hours later x1 in 24 hours.  [name] glimepiride (AMARYL) 4 MG tablet TAKE 1 TABLET (4 MG TOTAL) BY MOUTH DAILY WITH BREAKFAST.  [name] glucose blood (ONE TOUCH ULTRA TEST) test strip USE TO CHECK BLOOD SUGAR ONCE A DAY AS INSTRUCTED  . Omega-3 Fatty Acids (FISH OIL) 1000 MG CAPS Take 2,000 mg by mouth 2 (two) times daily.  . Pitavastatin Calcium (LIVALO) 4 MG TABS Take 1 tablet (4 mg total) by mouth daily.  . predniSONE (DELTASONE) 10 MG tablet Take 5 PO x 3 days, then 4 PO x 3 days, then 3 PO x 3 days, then 2 PO x 3 days then 1 PO x 3 days.  . sitaGLIPtin (JANUVIA) 100 MG tablet Take 1 tablet (100 mg total) by mouth daily.  . vitamin E 400 UNIT capsule Take 1 capsule (400 Units total) by mouth daily.     1. Essential hypertension  No c/o chest pain, sob or headache. Does not check blood pressure at home. BP Readings from Last 3 Encounters:  05/10/17 128/71  02/26/17 131/80  01/14/17 (!) 150/83     2. Type 2 diabetes mellitus without complication, without long-term current use of insulin (HCC) last HGBA1c was 7.5%. Blood suagrs have been  up a little because ehe was on rednisone for a few days. He says he was on [place] nd that did not help with blood sugar. He says that metformin 2 at night worked better. So he stopped the [place]. We had stopped metformin for acute renal failure several year sago but last creatine has come back down to 1.4  3. Closed compression fracture of thoracic vertebra with routine healing, subsequent encounter rides horses alot judging hunting competitions. They seem to think that riding horses is putting pressure on back.  4. Mixed hyperlipidemia  Does not really watch diet  5. Osteoporosis of vertebra  Last dexa scan was done on 03/01/16 with t score of -2.1. He does a lot of walking and takes vitamin d a calcium daily  6. Recent unexplained weight loss  He says this has resolved and weight is gong back upward.    New complaints: None today  Social history: Had a bad bout with gout last month that is just now resolving.    Review of Systems  Constitutional: Negative for activity change and appetite change.  HENT: Negative.   Eyes: Negative for pain.  Respiratory: Negative for shortness of breath.   Cardiovascular: Negative for chest pain, palpitations  and leg swelling.  Gastrointestinal: Negative for abdominal pain.  Endocrine: Negative for polydipsia.  Genitourinary: Negative.   Skin: Negative for rash.  Neurological: Negative for dizziness, weakness and headaches.  Hematological: Does not bruise/bleed easily.  Psychiatric/Behavioral: Negative.   All other systems reviewed and are negative.      Objective:   Physical Exam  Constitutional: He is oriented to person, place, and time. He appears well-developed and well-nourished.  HENT:  Head: Normocephalic.  Right Ear: External ear normal.  Left Ear: External ear normal.  Nose: Nose normal.  Mouth/Throat: Oropharynx is clear and moist.  Eyes: EOM are normal. Pupils are equal, round, and reactive to light.  Neck: Normal range of  motion. Neck supple. No JVD present. No thyromegaly present.  Cardiovascular: Normal rate, regular rhythm, normal heart sounds and intact distal pulses. Exam reveals no gallop and no friction rub.  No murmur heard. Pulmonary/Chest: Effort normal and breath sounds normal. No respiratory distress. He has no wheezes. He has no rales. He exhibits no tenderness.  Abdominal: Soft. Bowel sounds are normal. He exhibits no mass. There is no tenderness.  Genitourinary: Prostate normal and penis normal.  Musculoskeletal: Normal range of motion. He exhibits no edema.  Lymphadenopathy:    He has no cervical adenopathy.  Neurological: He is alert and oriented to person, place, and time. No cranial nerve deficit.  Skin: Skin is warm and dry.  Psychiatric: He has a normal mood and affect. His behavior is normal. Judgment and thought content normal.   BP (!) 148/80   Pulse (!) 58   Temp (!) 96.9 F (36.1 C) (Oral)   Ht 5' 8"  (1.727 m)   Wt 163 lb (73.9 kg)   BMI 24.78 kg/m   HGBA1c 7.2%      Assessment & Plan:  1. Essential hypertension Low sodium diet - CMP14+EGFR  2. Type 2 diabetes mellitus without complication, without long-term current use of insulin (HCC) Continue to watch carbs in diet back on metformin 516m bid - Bayer DCA Hb A1c Waived - glimepiride (AMARYL) 4 MG tablet; Take 1 tablet (4 mg total) by mouth daily with breakfast.  Dispense: 90 tablet; Refill: 0  3. Closed compression fracture of thoracic vertebra with routine healing, subsequent encounter Limit riding if having back pain  4. Mixed hyperlipidemia continue low fat diet - Lipid panel - Pitavastatin Calcium (LIVALO) 4 MG TABS; Take 1 tablet (4 mg total) by mouth daily.  Dispense: 30 tablet; Refill: 5  5. Osteoporosis of vertebra Weight bearing exercise encouraged  6. Recent unexplained weight loss Has resolved    Labs pending Health maintenance reviewed Diet and exercise encouraged Continue all meds Follow  up  In 3 months   [place] FNP

## 2017-06-21 ENCOUNTER — Other Ambulatory Visit: Payer: Self-pay | Admitting: Nurse Practitioner

## 2017-07-22 ENCOUNTER — Encounter: Payer: Self-pay | Admitting: *Deleted

## 2017-07-22 ENCOUNTER — Ambulatory Visit (INDEPENDENT_AMBULATORY_CARE_PROVIDER_SITE_OTHER): Payer: PPO | Admitting: *Deleted

## 2017-07-22 VITALS — BP 109/62 | HR 58 | Ht 66.0 in | Wt 162.0 lb

## 2017-07-22 DIAGNOSIS — Z0000 Encounter for general adult medical examination without abnormal findings: Secondary | ICD-10-CM | POA: Diagnosis not present

## 2017-07-22 NOTE — Progress Notes (Addendum)
Subjective:   Peter York is a 72 y.o. male who presents for a subsequent Medicare Annual Wellness Visit. Peter York is married and lives at home with his wife. 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 He travels along the [place] doing field trials. 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 He enjoys riding motorcycles but doesn't ride as often as he used to and he is very active. They have 2 adult daughters, 1 son, and one great grandchild.   Review of Systems  Health is about the same as last year.   Musc: He did have a gout flare up recently and was on prednisone which helped. He is still using colchicine and continues to have some tenderness in the right middle finger although it has greatly improved.   Cardiac Risk Factors include: advanced age (>62men, >60 women);diabetes mellitus;dyslipidemia;male gender;hypertension  Endo: blood sugars were elevated while on prednisone but have came down since finishing it   Objective:    Today's Vitals   07/22/17 0837  BP: 109/62  Pulse: (!) 58  Weight: 162 lb (73.5 kg)  Height: 5\' 6"  (1.676 m)   Body mass index is 26.15 kg/m.  Advanced Directives 07/22/2017 07/19/2016 02/11/2016 12/20/2014 12/06/2014 10/02/2011 09/26/2011  Does Patient Have a Medical Advance Directive? No No No No Yes Patient does not have advance directive Patient does not have advance directive  Type of Advance Directive - - - - Anon Raices - -  Would patient like information on creating a medical advance directive? No - Patient declined Yes (MAU/Ambulatory/Procedural Areas - Information given) No - patient declined information No - patient declined information - - -  Pre-existing out of facility DNR order (yellow form or pink MOST form) - - - - - No -    Current Medications (verified) Outpatient Encounter Medications as of 07/22/2017    Medication Sig  . alendronate (FOSAMAX) 70 MG tablet Take 1 tablet (70 mg total) by mouth every 7 (seven) days. Take with a full glass of water on an empty stomach.  [name] amLODipine (NORVASC) 10 MG tablet Take 1 tablet (10 mg total) by mouth daily.  [name] aspirin 81 MG tablet Take 81 mg by mouth daily.  . chlorthalidone (HYGROTON) 25 MG tablet Take 1 tablet (25 mg total) by mouth daily.  . cholecalciferol (VITAMIN D) 1000 units tablet Take 1 tablet (1,000 Units total) by mouth daily.  . cloNIDine (CATAPRES) 0.1 MG tablet Take 1 tablet (0.1 mg total) by mouth 2 (two) times daily.  . colchicine 0.6 MG tablet TAKE 2 TABLETS BY MOUTH AT GOUT ONSET. MAY REPEAT 1 TABLET 2 HOURS LATER ONCE IN 24 HOURS  . glimepiride (AMARYL) 4 MG tablet Take 1 tablet (4 mg total) by mouth daily with breakfast.  . glucose blood (ONE TOUCH ULTRA TEST) test strip USE TO CHECK BLOOD SUGAR ONCE A DAY AS INSTRUCTED  . metFORMIN (GLUCOPHAGE) 500 MG tablet Take 1 tablet (500 mg total) by mouth 2 (two) times daily with a meal.  . Omega-3 Fatty Acids (FISH OIL) 1000 MG CAPS Take 2,000 mg by mouth 2 (two) times daily.  . Pitavastatin Calcium (LIVALO) 4 MG TABS Take 1 tablet (4 mg total) by mouth daily.  . vitamin E 400 UNIT capsule Take 1 capsule (400 Units total) by mouth daily.   No facility-administered encounter medications  on file as of 07/22/2017.     Allergies (verified) Benazepril and Crestor [rosuvastatin calcium]   History: Past Medical History:  Diagnosis Date  . Adenomatous colon polyp 12/31/2005  . Diabetes mellitus   . Hypercholesterolemia   . Hypertension    Past Surgical History:  Procedure Laterality Date  . ANAL FISTULECTOMY  10/02/2011   Procedure: FISTULECTOMY ANAL;  Surgeon: Joyice Faster. Cornett, MD;  Location: [facility];  Service: General;  Laterality: N/A;  excision perianal mass   . APPENDECTOMY    . COLONOSCOPY W/ POLYPECTOMY  12/31/2005   Dr. Silvano Rusk  . UMBILICAL HERNIA REPAIR      Family History  Problem Relation Age of Onset  . Colon cancer Father 37  . Colon polyps Father   . Cancer Father 54       Colon  . Heart attack Maternal Uncle        X 4  . Heart disease Maternal Uncle   . Heart disease Maternal Uncle   . Heart disease Maternal Uncle   . Diabetes Daughter 13       Type 1  . Transient ischemic attack Brother 54   Social History   Socioeconomic History  . Marital status: Married    Spouse name: Not on file  . Number of children: 3  . Years of education: Not on file  . Highest education level: Not on file  Occupational History  . Occupation: retired  Scientific laboratory technician  . Financial resource strain: Not hard at all  . Food insecurity:    Worry: Never true    Inability: Never true  . Transportation needs:    Medical: No    Non-medical: No  Tobacco Use  . Smoking status: Former Smoker    Packs/day: 0.75    Years: 15.00    Pack years: 11.25    Types: Cigarettes    Last attempt to quit: 03/26/1978    Years since quitting: 39.3  . Smokeless tobacco: Never Used  Substance and Sexual Activity  . Alcohol use: Yes    Comment: socially  . Drug use: No  . Sexual activity: Yes  Lifestyle  . Physical activity:    Days per week: 7 days    Minutes per session: 90 min  . Stress: Not at all  Relationships  . Social connections:    Talks on phone: More than three times a week    Gets together: More than three times a week    Attends religious service: Never    Active member of club or organization: No    Attends meetings of clubs or organizations: Never    Relationship status: Married  Other Topics Concern  . Not on file  Social History Narrative  . Not on file   Tobacco Counseling No tobacco use  Clinical Intake:  Pre-visit preparation completed: No  Pain : No/denies pain     Nutritional Status: BMI of 19-24  Normal Diabetes: Yes CBG done?: No Did pt. bring in CBG monitor from home?: No  How often do you need to have someone  help you when you read instructions, pamphlets, or other written materials from your doctor or pharmacy?: 1 - Never What is the last grade level you completed in school?: 2 years of college  Interpreter Needed?: No  Information entered by :: Chong Sicilian, RN  Activities of Daily Living In your present state of health, do you have any difficulty performing the following activities: 07/22/2017  Hearing? Y  Comment Has some hearing loss due to working around loud machinery. Has tinnitus  Vision? Y  Comment has some blurriness in right eye and thinks he may have a cataract forming. Last eye exam 11/2016 and she noticed a small one.   Difficulty concentrating or making decisions? N  Comment Has always had some time with memory loss  Walking or climbing stairs? N  Dressing or bathing? N  Doing errands, shopping? N  Preparing Food and eating ? N  Using the Toilet? N  In the past six months, have you accidently leaked urine? N  Do you have problems with loss of bowel control? N  Managing your Medications? N  Comment Has a bag that he keeps them in and has a routine  Managing your Finances? N  Housekeeping or managing your Housekeeping? N  Some recent data might be hidden     Immunizations and Health Maintenance Immunization History  Administered Date(s) Administered  . Pneumococcal Conjugate-13 04/14/2013  . Pneumococcal Polysaccharide-23 09/16/2014  . Td 01/29/2015   Health Maintenance Due  Topic Date Due  . OPHTHALMOLOGY EXAM  12/12/2016    Patient Care Team: Chevis Pretty, FNP as PCP - General (Family Medicine) Gatha Mayer, MD as Consulting Physician (Gastroenterology)  No hospitalizations, ER visits, or surgeries this past year.      Assessment:   This is a routine wellness examination for [place].  Hearing/Vision screen No deficits noted during visit. [facility] exam [facility] 11/2016. Will need to request the report.   Dietary issues and exercise activities  discussed:  Diet 3 meals a day. Mix of both eating out and homecooked meals.   Current Exercise Habits: Home exercise routine, Type of exercise: walking, Time (Minutes): 60, Frequency (Times/Week): 7, Weekly Exercise (Minutes/Week): 420, Intensity: Moderate, Exercise limited by: None identified  Goals    . Blood Pressure < 130/80    . Exercise 150 min/wk Moderate Activity      Depression Screen PHQ 2/9 Scores 07/22/2017 06/04/2017 02/26/2017 01/14/2017  PHQ - 2 Score 0 0 0 0    Fall Risk Fall Risk  07/22/2017 06/04/2017 02/26/2017 01/14/2017 12/13/2016  Falls in the past year? No No No No No    Is the patient's home free of loose throw rugs in walkways, pet beds, electrical cords, etc?   yes      Grab bars in the bathroom? no      Handrails on the stairs?   yes      Adequate lighting?   yes   Cognitive Function: MMSE - Mini Mental State Exam 07/22/2017 07/19/2016  Orientation to time 5 5  Orientation to Place 5 5  Registration 3 3  Attention/ Calculation 4 5  Recall 2 0  Language- name 2 objects 2 2  Language- repeat 1 1  Language- follow 3 step command 3 3  Language- read & follow direction 1 1  Write a sentence 1 1  Copy design 1 1  Total score 28 27        Screening Tests Health Maintenance  Topic Date Due  . OPHTHALMOLOGY EXAM  12/12/2016  . URINE MICROALBUMIN  08/14/2017  . HEMOGLOBIN A1C  09/04/2017  . COLONOSCOPY  12/19/2017  . FOOT EXAM  06/05/2018  . TETANUS/TDAP  01/28/2025  . Hepatitis C Screening  Completed  . PNA vac Low Risk Adult  Completed  . INFLUENZA VACCINE  Discontinued    Qualifies for Shingles Vaccine? Yes  Cancer Screenings: Lung:  Low Dose CT Chest recommended if Age 28-80 years, 30 pack-year currently smoking OR have quit w/in 15years. Patient does not qualify. Colorectal: due 11/2017 per last pathology report    Plan:  Keep f/u with PCP Colonoscopy due 11/2017 Continue to stay active. Aim for at least 150 minutes of moderate activity a  week.   I have personally reviewed and noted the following in the patient's chart:   . Medical and social history . Use of alcohol, tobacco or illicit drugs  . Current medications and supplements . Functional ability and status . Nutritional status . Physical activity . Advanced directives . List of other physicians . Hospitalizations, surgeries, and ER visits in previous 12 months . Vitals . Screenings to include cognitive, depression, and falls . Referrals and appointments  In addition, I have reviewed and discussed with patient certain preventive protocols, quality metrics, and best practice recommendations. A written personalized care plan for preventive services as well as general preventive health recommendations were provided to patient.     Chong Sicilian, RN   07/22/2017    I have reviewed and agree with the above AWV documentation.   Mary-Margaret Hassell Done, FNP

## 2017-07-22 NOTE — Patient Instructions (Signed)
  Peter York , Thank you for taking time to come for your Medicare Wellness Visit. I appreciate your ongoing commitment to your health goals. Please review the following plan we discussed and let me know if I can assist you in the future.   These are the goals we discussed: Goals    . Blood Pressure < 130/80    . Exercise 150 min/wk Moderate Activity       This is a list of the screening recommended for you and due dates:  Health Maintenance  Topic Date Due  . Eye exam for diabetics  12/12/2016  . Urine Protein Check  08/14/2017  . Hemoglobin A1C  09/04/2017  . Colon Cancer Screening  12/19/2017  . Complete foot exam   06/05/2018  . Tetanus Vaccine  01/28/2025  .  Hepatitis C: One time screening is recommended by Center for Disease Control  (CDC) for  adults born from [birth year] through 1965.   Completed  . Pneumonia vaccines  Completed  . Flu Shot  Discontinued

## 2017-08-14 ENCOUNTER — Ambulatory Visit: Payer: PPO | Admitting: Cardiology

## 2017-08-26 ENCOUNTER — Encounter: Payer: Self-pay | Admitting: Cardiology

## 2017-08-26 ENCOUNTER — Ambulatory Visit: Payer: PPO | Admitting: Cardiology

## 2017-08-26 VITALS — BP 136/72 | HR 61 | Ht 66.0 in | Wt 161.0 lb

## 2017-08-26 DIAGNOSIS — E119 Type 2 diabetes mellitus without complications: Secondary | ICD-10-CM | POA: Diagnosis not present

## 2017-08-26 DIAGNOSIS — E782 Mixed hyperlipidemia: Secondary | ICD-10-CM

## 2017-08-26 DIAGNOSIS — I10 Essential (primary) hypertension: Secondary | ICD-10-CM | POA: Diagnosis not present

## 2017-08-26 NOTE — Progress Notes (Signed)
Cardiology Office Note:    Date:  08/26/2017   ID:  Peter York, DOB 1945/04/26, MRN [id number]  PCP:  Chevis Pretty, FNP  Cardiologist:  No primary care provider on file.    Referring MD: Chevis Pretty, *   Chief Complaint  Patient presents with  . Hypertension  . Hyperlipidemia    History of Present Illness:    Peter York is a 72 y.o. male with a hx of HTN, DM and dyslipidemia.  he is here today for followup and is doing well.  He denies any chest pain or pressure, SOB, DOE, PND, orthopnea, LE edema, dizziness, palpitations or syncope. He is compliant with his meds and is tolerating meds with no SE. unfortunately he has been intolerant to Crestor and Livalo due to significant muscle aches and is currently not on any lipid-lowering agents and last LDL was above 170.  Past Medical History:  Diagnosis Date  . Adenomatous colon polyp 12/31/2005  . Diabetes mellitus   . Hypercholesterolemia   . Hypertension     Past Surgical History:  Procedure Laterality Date  . ANAL FISTULECTOMY  10/02/2011   Procedure: FISTULECTOMY ANAL;  Surgeon: Joyice Faster. Cornett, MD;  Location: [facility];  Service: General;  Laterality: N/A;  excision perianal mass   . APPENDECTOMY    . COLONOSCOPY W/ POLYPECTOMY  12/31/2005   Dr. Silvano Rusk  . UMBILICAL HERNIA REPAIR      Current Medications: Current Meds  Medication Sig  . alendronate (FOSAMAX) 70 MG tablet Take 1 tablet (70 mg total) by mouth every 7 (seven) days. Take with a full glass of water on an empty stomach.  [name] amLODipine (NORVASC) 10 MG tablet Take 1 tablet (10 mg total) by mouth daily.  [name] aspirin 81 MG tablet Take 81 mg by mouth daily.  . chlorthalidone (HYGROTON) 25 MG tablet Take 1 tablet (25 mg total) by mouth daily.  . cholecalciferol (VITAMIN D) 1000 units tablet Take 1 tablet (1,000 Units total) by mouth daily.  . cloNIDine (CATAPRES) 0.1 MG tablet Take 1 tablet (0.1 mg total) by mouth 2 (two)  times daily.  . colchicine 0.6 MG tablet TAKE 2 TABLETS BY MOUTH AT GOUT ONSET. MAY REPEAT 1 TABLET 2 HOURS LATER ONCE IN 24 HOURS  . glimepiride (AMARYL) 4 MG tablet Take 1 tablet (4 mg total) by mouth daily with breakfast.  . glucose blood (ONE TOUCH ULTRA TEST) test strip USE TO CHECK BLOOD SUGAR ONCE A DAY AS INSTRUCTED  . metFORMIN (GLUCOPHAGE) 500 MG tablet Take 1 tablet (500 mg total) by mouth 2 (two) times daily with a meal.  . Omega-3 Fatty Acids (FISH OIL) 1000 MG CAPS Take 2,000 mg by mouth 2 (two) times daily.  . Pitavastatin Calcium (LIVALO) 4 MG TABS Take 1 tablet (4 mg total) by mouth daily.  . vitamin E 400 UNIT capsule Take 1 capsule (400 Units total) by mouth daily.     Allergies:   Benazepril and Crestor [rosuvastatin calcium]   Social History   Socioeconomic History  . Marital status: Married    Spouse name: Not on file  . Number of children: 3  . Years of education: Not on file  . Highest education level: Not on file  Occupational History  . Occupation: retired  Scientific laboratory technician  . Financial resource strain: Not hard at all  . Food insecurity:    Worry: Never true    Inability: Never true  . Transportation needs:  Medical: No    Non-medical: No  Tobacco Use  . Smoking status: Former Smoker    Packs/day: 0.75    Years: 15.00    Pack years: 11.25    Types: Cigarettes    Last attempt to quit: 03/26/1978    Years since quitting: 39.4  . Smokeless tobacco: Never Used  Substance and Sexual Activity  . Alcohol use: Yes    Comment: socially  . Drug use: No  . Sexual activity: Yes  Lifestyle  . Physical activity:    Days per week: 7 days    Minutes per session: 90 min  . Stress: Not at all  Relationships  . Social connections:    Talks on phone: More than three times a week    Gets together: More than three times a week    Attends religious service: Never    Active member of club or organization: No    Attends meetings of clubs or organizations: Never     Relationship status: Married  Other Topics Concern  . Not on file  Social History Narrative  . Not on file     Family History: The patient's family history includes Cancer (age of onset: 58) in his father; Colon cancer (age of onset: 91) in his father; Colon polyps in his father; Diabetes (age of onset: 44) in his daughter; Heart attack in his maternal uncle; Heart disease in his maternal uncle, maternal uncle, and maternal uncle; Transient ischemic attack (age of onset: 44) in his brother.  ROS:   Please see the history of present illness.    ROS  All other systems reviewed and negative.   EKGs/Labs/Other Studies Reviewed:    The following studies were reviewed today: none  EKG:  EKG is  ordered today.  The ekg ordered today demonstrates normal sinus rhythm at 61 bpm with early repolarization  Recent Labs: 01/14/2017: Hemoglobin 11.5; Platelets 260 06/04/2017: ALT 13; BUN 29; Creatinine, Ser 1.62; Potassium 4.5; Sodium 139   Recent Lipid Panel    Component Value Date/Time   CHOL 240 (H) 06/04/2017 1039   CHOL 165 10/02/2012 0834   TRIG 199 (H) 06/04/2017 1039   TRIG 115 03/09/2014 0818   TRIG 95 10/02/2012 0834   HDL 29 (L) 06/04/2017 1039   HDL 43 03/09/2014 0818   HDL 51 10/02/2012 0834   CHOLHDL 8.3 (H) 06/04/2017 1039   LDLCALC 171 (H) 06/04/2017 1039   LDLCALC 223 (H) 09/10/2013 0905   LDLCALC 95 10/02/2012 0834    Physical Exam:    VS:  BP 136/72   Pulse 61   Ht 5\' 6"  (1.676 m)   Wt 161 lb (73 kg)   SpO2 96%   BMI 25.99 kg/m     Wt Readings from Last 3 Encounters:  08/26/17 161 lb (73 kg)  07/22/17 162 lb (73.5 kg)  06/04/17 163 lb (73.9 kg)     GEN:  Well nourished, well developed in no acute distress HEENT: Normal NECK: No JVD; No carotid bruits LYMPHATICS: No lymphadenopathy CARDIAC: RRR, no murmurs, rubs, gallops RESPIRATORY:  Clear to auscultation without rales, wheezing or rhonchi  ABDOMEN: Soft, non-tender, non-distended MUSCULOSKELETAL:   No edema; No deformity  SKIN: Warm and dry NEUROLOGIC:  Alert and oriented x 3 PSYCHIATRIC:  Normal affect   ASSESSMENT:    1. Essential hypertension   2. Type 2 diabetes mellitus without complication, without long-term current use of insulin ([place])   3. Mixed hyperlipidemia    PLAN:  In order of problems listed above:  1.  Hypertension -BP is well controlled on exam today.  He will continue on clonidine 0.1 mg twice daily, amlodipine 10 mg daily, chlorthalidone 25 mg daily.  Creatinine was stable at 1.62 in March 2019  2.  Type 2 diabetes mellitus -followed by his PCP.  Last hemoglobin A1c in March was 7.2.  3.  Hyperlipidemia -his last LDL was 171 in March 2019.  He has been intolerant to Crestor in the past and stopped taking Livalo because he did not like the muscle aches.  I will refer him to lipid clinic.   Medication Adjustments/Labs and Tests Ordered: Current medicines are reviewed at length with the patient today.  Concerns regarding medicines are outlined above.  No orders of the defined types were placed in this encounter.  No orders of the defined types were placed in this encounter.   Signed, Fransico Him, MD  08/26/2017 9:42 AM    [facility]

## 2017-08-26 NOTE — Patient Instructions (Signed)
Medication Instructions:  Your physician recommends that you continue on your current medications as directed. Please refer to the Current Medication list given to you today.  If you need a refill on your cardiac medications, please contact your pharmacy first.  Labwork: None ordered   Testing/Procedures: None ordered   Follow-Up: You have been referred to lipid clinic   Your physician wants you to follow-up in: 1 year with Dr. Radford Pax. You will receive a reminder letter in the mail two months in advance. If you don't receive a letter, please call our office to schedule the follow-up appointment.  Any Other Special Instructions Will Be Listed Below (If Applicable).   Thank you for choosing [facility], RN  [phone]  If you need a refill on your cardiac medications before your next appointment, please call your pharmacy.

## 2017-08-28 DIAGNOSIS — R972 Elevated prostate specific antigen [PSA]: Secondary | ICD-10-CM | POA: Diagnosis not present

## 2017-08-28 NOTE — Addendum Note (Signed)
Addended by: Rose Phi on: 08/28/2017 08:48 AM   Modules accepted: Orders

## 2017-09-05 ENCOUNTER — Ambulatory Visit (INDEPENDENT_AMBULATORY_CARE_PROVIDER_SITE_OTHER): Payer: PPO | Admitting: Nurse Practitioner

## 2017-09-05 VITALS — BP 147/88 | HR 60 | Temp 97.0°F | Ht 66.0 in | Wt 158.0 lb

## 2017-09-05 DIAGNOSIS — E782 Mixed hyperlipidemia: Secondary | ICD-10-CM

## 2017-09-05 DIAGNOSIS — M810 Age-related osteoporosis without current pathological fracture: Secondary | ICD-10-CM

## 2017-09-05 DIAGNOSIS — I10 Essential (primary) hypertension: Secondary | ICD-10-CM | POA: Diagnosis not present

## 2017-09-05 DIAGNOSIS — E119 Type 2 diabetes mellitus without complications: Secondary | ICD-10-CM

## 2017-09-05 DIAGNOSIS — S22000D Wedge compression fracture of unspecified thoracic vertebra, subsequent encounter for fracture with routine healing: Secondary | ICD-10-CM

## 2017-09-05 LAB — CMP14+EGFR
A/G RATIO: 1.4 (ref 1.2–2.2)
ALBUMIN: 4.4 g/dL (ref 3.5–4.8)
ALT: 10 IU/L (ref 0–44)
AST: 14 IU/L (ref 0–40)
Alkaline Phosphatase: 71 IU/L (ref 39–117)
BILIRUBIN TOTAL: 0.3 mg/dL (ref 0.0–1.2)
BUN / CREAT RATIO: 19 (ref 10–24)
BUN: 29 mg/dL — ABNORMAL HIGH (ref 8–27)
CHLORIDE: 101 mmol/L (ref 96–106)
CO2: 22 mmol/L (ref 20–29)
Calcium: 9.4 mg/dL (ref 8.6–10.2)
Creatinine, Ser: 1.51 mg/dL — ABNORMAL HIGH (ref 0.76–1.27)
GFR calc non Af Amer: 45 mL/min/{1.73_m2} — ABNORMAL LOW (ref >59)
GFR, EST AFRICAN AMERICAN: 53 mL/min/{1.73_m2} — AB (ref >59)
Globulin, Total: 3.1 g/dL (ref 1.5–4.5)
Glucose: 140 mg/dL — ABNORMAL HIGH (ref 65–99)
POTASSIUM: 4.5 mmol/L (ref 3.5–5.2)
Sodium: 139 mmol/L (ref 134–144)
Total Protein: 7.5 g/dL (ref 6.0–8.5)

## 2017-09-05 LAB — LIPID PANEL
Chol/HDL Ratio: 8.4 ratio — ABNORMAL HIGH (ref 0.0–5.0)
Cholesterol, Total: 245 mg/dL — ABNORMAL HIGH (ref 100–199)
HDL: 29 mg/dL — ABNORMAL LOW (ref >39)
LDL Calculated: 181 mg/dL — ABNORMAL HIGH (ref 0–99)
Triglycerides: 176 mg/dL — ABNORMAL HIGH (ref 0–149)
VLDL Cholesterol Cal: 35 mg/dL (ref 5–40)

## 2017-09-05 LAB — BAYER DCA HB A1C WAIVED: HB A1C: 6.5 % (ref <7.0)

## 2017-09-05 MED ORDER — COLCHICINE 0.6 MG PO TABS: ORAL_TABLET | ORAL | 2 refills | Status: DC

## 2017-09-05 MED ORDER — ALENDRONATE SODIUM 70 MG PO TABS: 70.0000 mg | ORAL_TABLET | ORAL | 2 refills | Status: DC

## 2017-09-05 MED ORDER — PITAVASTATIN CALCIUM 4 MG PO TABS: 1.0000 | ORAL_TABLET | Freq: Every day | ORAL | 5 refills | Status: DC

## 2017-09-05 MED ORDER — GLIMEPIRIDE 4 MG PO TABS: 4.0000 mg | ORAL_TABLET | Freq: Every day | ORAL | 0 refills | Status: DC

## 2017-09-05 MED ORDER — CHLORTHALIDONE 25 MG PO TABS: 25.0000 mg | ORAL_TABLET | Freq: Every day | ORAL | 3 refills | Status: DC

## 2017-09-05 MED ORDER — AMLODIPINE BESYLATE 10 MG PO TABS: 10.0000 mg | ORAL_TABLET | Freq: Every day | ORAL | 3 refills | Status: DC

## 2017-09-05 MED ORDER — METFORMIN HCL 500 MG PO TABS: 500.0000 mg | ORAL_TABLET | Freq: Two times a day (BID) | ORAL | 1 refills | Status: DC

## 2017-09-05 NOTE — Patient Instructions (Signed)

## 2017-09-05 NOTE — Progress Notes (Signed)
Subjective:    Patient ID: Peter York, male    DOB: 07/06/1945, 72 y.o.   MRN: [id number]  Chief Complaint: Medical Management of Chronic Issues   HPI:  1. Type 2 diabetes mellitus without complication, without long-term current use of insulin (HCC) lood sugars at home ru a little high in monings- running around 170. Drops to 90 in afternoons.  2. Mixed hyperlipidemia  Watches diet. Stays very active.  3. Essential hypertension  No c/o shet pain, sob or headache. Does n ot check blood pressure at home. BP Readings from Last 3 Encounters:  09/05/17 (!) 147/88  08/26/17 136/72  07/22/17 109/62     4. Osteoporosis of vertebra   no c/o back pain. Rides horses a lot which will sometimes flare his back up.  5. Closed compression fracture of thoracic vertebra with routine healing, subsequent encounter  No recent fractures.    Outpatient Encounter Medications as of 09/05/2017  Medication Sig  . alendronate (FOSAMAX) 70 MG tablet Take 1 tablet (70 mg total) by mouth every 7 (seven) days. Take with a full glass of water on an empty stomach.  [name] amLODipine (NORVASC) 10 MG tablet Take 1 tablet (10 mg total) by mouth daily.  [name] aspirin 81 MG tablet Take 81 mg by mouth daily.  . chlorthalidone (HYGROTON) 25 MG tablet Take 1 tablet (25 mg total) by mouth daily.  . cholecalciferol (VITAMIN D) 1000 units tablet Take 1 tablet (1,000 Units total) by mouth daily.  . cloNIDine (CATAPRES) 0.1 MG tablet Take 1 tablet (0.1 mg total) by mouth 2 (two) times daily.  . colchicine 0.6 MG tablet TAKE 2 TABLETS BY MOUTH AT GOUT ONSET. MAY REPEAT 1 TABLET 2 HOURS LATER ONCE IN 24 HOURS  . glimepiride (AMARYL) 4 MG tablet Take 1 tablet (4 mg total) by mouth daily with breakfast.  . glucose blood (ONE TOUCH ULTRA TEST) test strip USE TO CHECK BLOOD SUGAR ONCE A DAY AS INSTRUCTED  . metFORMIN (GLUCOPHAGE) 500 MG tablet Take 1 tablet (500 mg total) by mouth 2 (two) times daily with a meal.  . Omega-3 Fatty Acids  (FISH OIL) 1000 MG CAPS Take 2,000 mg by mouth 2 (two) times daily.  . Pitavastatin Calcium (LIVALO) 4 MG TABS Take 1 tablet (4 mg total) by mouth daily.  . vitamin E 400 UNIT capsule Take 1 capsule (400 Units total) by mouth daily.    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   Review of Systems  Constitutional: Negative for activity change and appetite change.  HENT: Negative.   Eyes: Negative for pain.  Respiratory: Negative for shortness of breath.   Cardiovascular: Negative for chest pain, palpitations and leg swelling.  Gastrointestinal: Negative for abdominal pain.  Endocrine: Negative for polydipsia.  Genitourinary: Negative.   Skin: Negative for rash.  Neurological: Negative for dizziness, weakness and headaches.  Hematological: Does not bruise/bleed easily.  Psychiatric/Behavioral: Negative.   All other systems reviewed and are negative.      Objective:   Physical Exam  Constitutional: He is oriented to person, place, and time.  HENT:  Head: Normocephalic.  Nose: Nose normal.  Mouth/Throat: Oropharynx is clear and moist.  Eyes: Pupils are equal, round, and reactive to light. EOM are normal.  Neck: Normal range of motion and phonation normal. Neck supple. No JVD present. Carotid bruit is not present. No thyroid mass and no thyromegaly present.  Cardiovascular:  Normal rate and regular rhythm.  Pulmonary/Chest: Effort normal and breath sounds normal. No respiratory distress.  Abdominal: Soft. Normal appearance, normal aorta and bowel sounds are normal. There is no tenderness.  Musculoskeletal: Normal range of motion.  Lymphadenopathy:    He has no cervical adenopathy.  Neurological: He is alert and oriented to person, place, and time.  Skin: Skin is warm and dry.  Psychiatric: He has a normal mood and affect. His behavior is normal. Judgment and thought content normal.   BP (!) 147/88   Pulse  60   Temp (!) 97 F (36.1 C) (Oral)   Ht 5' 6"  (1.676 m)   Wt 158 lb (71.7 kg)   BMI 25.50 kg/m   HGBA1C 6.5%     Assessment & Plan:  Peter York comes in today with chief complaint of Medical Management of Chronic Issues   Diagnosis and orders addressed:  1. Type 2 diabetes mellitus without complication, without long-term current use of insulin (HCC) Continue to watch diet - Bayer DCA Hb A1c Waived - Microalbumin / creatinine urine ratio - glimepiride (AMARYL) 4 MG tablet; Take 1 tablet (4 mg total) by mouth daily with breakfast.  Dispense: 90 tablet; Refill: 0 - metFORMIN (GLUCOPHAGE) 500 MG tablet; Take 1 tablet (500 mg total) by mouth 2 (two) times daily with a meal.  Dispense: 180 tablet; Refill: 1  2. Mixed hyperlipidemia Low fat diet - Lipid panel - Pitavastatin Calcium (LIVALO) 4 MG TABS; Take 1 tablet (4 mg total) by mouth daily.  Dispense: 30 tablet; Refill: 5  3. Essential hypertension Low sodium diet - CMP14+EGFR - chlorthalidone (HYGROTON) 25 MG tablet; Take 1 tablet (25 mg total) by mouth daily.  Dispense: 90 tablet; Refill: 3 - amLODipine (NORVASC) 10 MG tablet; Take 1 tablet (10 mg total) by mouth daily.  Dispense: 90 tablet; Refill: 3  4. Osteoporosis of vertebra - alendronate (FOSAMAX) 70 MG tablet; Take 1 tablet (70 mg total) by mouth every 7 (seven) days. Take with a full glass of water on an empty stomach.  Dispense: 12 tablet; Refill: 2  5. Closed compression fracture of thoracic vertebra with routine healing, subsequent encounter   Labs pending Health Maintenance reviewed Diet and exercise encouraged  Follow up plan: 3 months   [place], FNP

## 2017-09-06 LAB — MICROALBUMIN / CREATININE URINE RATIO
CREATININE, UR: 151 mg/dL
MICROALB/CREAT RATIO: 215.8 mg/g{creat} — AB (ref 0.0–30.0)
MICROALBUM., U, RANDOM: 325.9 ug/mL

## 2017-09-10 MED ORDER — LOSARTAN POTASSIUM 50 MG PO TABS: 50.0000 mg | ORAL_TABLET | Freq: Every day | ORAL | 1 refills | Status: DC

## 2017-09-10 NOTE — Addendum Note (Signed)
Addended by: Chevis Pretty on: 09/10/2017 09:19 AM   Modules accepted: Orders

## 2017-09-16 ENCOUNTER — Encounter: Payer: Self-pay | Admitting: Pharmacist

## 2017-09-16 ENCOUNTER — Ambulatory Visit (INDEPENDENT_AMBULATORY_CARE_PROVIDER_SITE_OTHER): Payer: PPO | Admitting: Pharmacist

## 2017-09-16 DIAGNOSIS — E782 Mixed hyperlipidemia: Secondary | ICD-10-CM

## 2017-09-16 MED ORDER — ATORVASTATIN CALCIUM 10 MG PO TABS: 10.0000 mg | ORAL_TABLET | Freq: Every day | ORAL | 3 refills | Status: DC

## 2017-09-16 NOTE — Progress Notes (Signed)
Patient ID: CASSON CATENA                 DOB: 1945-10-01                    MRN: [id number]     HPI: Peter York is a 72 y.o. male patient of Dr. Radford Pax that presents today for lipid evaluation.  PMH includes HTN, DM and dyslipidemia. His current ASCVD 10-year risk is 55.4% and thus should continue aggressive risk modification. He has been unable to tolerate Crestor and Livalo in the past due to muscle aches.   He presents today for discussion of cholesterol. He states that he does not want to take a medication for cholesterol. He does admit that he did well on atorvastatin 10mg  for years and never had any issues. He was eventually changed to rosuvastatin and developed muscle aches after a few days. He then tried Livalo and also developed aches. He states he has not been taking this for a few years now.   Risk Factors: HTN, DM  LDL Goal: <100 given 10-year risk, nonHDL <130  Current Medications: none Intolerances: Crestor and Livalo 4mg  daily (muslce aches, legs, back)  Diet: He does not eat meat much. He eats steak about 2 times per month. He does eat chicken and fish, but enjoys vegetables. He prepared most boiled and occasional fried. He does add butter occasionally. He drinks mostly water and some diet drinks.   Exercise: He is very active.   Family History: Cancer (age of onset: 5) in his father; Colon cancer (age of onset: 52) in his father; Colon polyps in his father; Diabetes (age of onset: 32) in his daughter; Heart attack in his maternal uncle; Heart disease in his maternal uncle, maternal uncle, and maternal uncle; Transient ischemic attack (age of onset: 36) in his brother.  Social History: former smoker   Labs: 09/05/17:  TC 245, TG 176, HDL 29, LDL 181 (no therapy)  Past Medical History:  Diagnosis Date  . Adenomatous colon polyp 12/31/2005  . Diabetes mellitus   . Hypercholesterolemia   . Hypertension     Current Outpatient Medications on File Prior to Visit    Medication Sig Dispense Refill  . alendronate (FOSAMAX) 70 MG tablet Take 1 tablet (70 mg total) by mouth every 7 (seven) days. Take with a full glass of water on an empty stomach. 12 tablet 2  . amLODipine (NORVASC) 10 MG tablet Take 1 tablet (10 mg total) by mouth daily. 90 tablet 3  . aspirin 81 MG tablet Take 81 mg by mouth daily.    . chlorthalidone (HYGROTON) 25 MG tablet Take 1 tablet (25 mg total) by mouth daily. 90 tablet 3  . cholecalciferol (VITAMIN D) 1000 units tablet Take 1 tablet (1,000 Units total) by mouth daily.    . cloNIDine (CATAPRES) 0.1 MG tablet Take 1 tablet (0.1 mg total) by mouth 2 (two) times daily. 180 tablet 3  . colchicine 0.6 MG tablet TAKE 2 TABLETS BY MOUTH AT GOUT ONSET. MAY REPEAT 1 TABLET 2 HOURS LATER ONCE IN 24 HOURS 30 tablet 2  . glimepiride (AMARYL) 4 MG tablet Take 1 tablet (4 mg total) by mouth daily with breakfast. 90 tablet 0  . glucose blood (ONE TOUCH ULTRA TEST) test strip USE TO CHECK BLOOD SUGAR ONCE A DAY AS INSTRUCTED 100 each 2  . losartan (COZAAR) 50 MG tablet Take 1 tablet (50 mg total) by mouth daily. 90 tablet  1  . metFORMIN (GLUCOPHAGE) 500 MG tablet Take 1 tablet (500 mg total) by mouth 2 (two) times daily with a meal. 180 tablet 1  . Omega-3 Fatty Acids (FISH OIL) 1000 MG CAPS Take 2,000 mg by mouth 2 (two) times daily.    . Pitavastatin Calcium (LIVALO) 4 MG TABS Take 1 tablet (4 mg total) by mouth daily. 30 tablet 5  . vitamin E 400 UNIT capsule Take 1 capsule (400 Units total) by mouth daily. 30 capsule 3   No current facility-administered medications on file prior to visit.     Allergies  Allergen Reactions  . Benazepril     hyperkalemia  . Crestor [Rosuvastatin Calcium]     "like to killed me." joint pain, unable to get OOB    Assessment/Plan: Hyperlipidemia: LDL and nonHDL are not at goal. Discussed options and patient is willing to restart atorvastatin 10mg  daily. Will recheck cholesterol/hepatic panel in 3 months and  adjust atorvastatin if needed. Advised he call if he has any issues with tolerability. Follow up in lipid clinic in 3 months.    Thank you,  Lelan Pons. Patterson Hammersmith, [facility]  09/16/2017 7:54 AM

## 2017-09-16 NOTE — Patient Instructions (Addendum)
Restart atorvastatin (Lipitor) 10mg  daily in the evening  We will check a cholesterol panel in 3 months and adjust the dose if needed.  Call [phone] if you have any issues with the medication.    Cholesterol Cholesterol is a fat. Your body needs a small amount of cholesterol. Cholesterol (plaque) may build up in your blood vessels (arteries). That makes you more likely to have a heart attack or stroke. You cannot feel your cholesterol level. Having a blood test is the only way to find out if your level is high. Keep your test results. Work with your doctor to keep your cholesterol at a good level. What do the results mean?  Total cholesterol is how much cholesterol is in your blood.  LDL is bad cholesterol. This is the type that can build up. Try to have low LDL.  HDL is good cholesterol. It cleans your blood vessels and carries LDL away. Try to have high HDL.  Triglycerides are fat that the body can store or burn for energy. What are good levels of cholesterol?  Total cholesterol below 200.  LDL below 100 is good for people who have health risks. LDL below 70 is good for people who have very high risks.  HDL above 40 is good. It is best to have HDL of 60 or higher.  Triglycerides below 150. How can I lower my cholesterol? Diet Follow your diet program as told by your doctor.  Choose fish, white meat chicken, or [place] that is roasted or baked. Try not to eat red meat, fried foods, sausage, or lunch meats.  Eat lots of fresh fruits and vegetables.  Choose whole grains, beans, pasta, potatoes, and cereals.  Choose olive oil, corn oil, or canola oil. Only use small amounts.  Try not to eat butter, mayonnaise, shortening, or palm kernel oils.  Try not to eat foods with trans fats.  Choose low-fat or nonfat dairy foods. ? Drink skim or nonfat milk. ? Eat low-fat or nonfat yogurt and cheeses. ? Try not to drink whole milk or cream. ? Try not to eat ice cream, egg  yolks, or full-fat cheeses.  Healthy desserts include angel food cake, ginger snaps, animal crackers, hard candy, popsicles, and low-fat or nonfat frozen yogurt. Try not to eat pastries, cakes, pies, and cookies.  Exercise Follow your exercise program as told by your doctor.  Be more active. Try gardening, walking, and taking the stairs.  Ask your doctor about ways that you can be more active.  Medicine  Take over-the-counter and prescription medicines only as told by your doctor. This information is not intended to replace advice given to you by your health care provider. Make sure you discuss any questions you have with your health care provider. Document Released: 06/08/2008 Document Revised: 10/12/2015 Document Reviewed: 09/22/2015 Elsevier Interactive Patient Education  [employer].

## 2017-09-21 ENCOUNTER — Other Ambulatory Visit: Payer: Self-pay | Admitting: Nurse Practitioner

## 2017-10-22 ENCOUNTER — Other Ambulatory Visit: Payer: Self-pay | Admitting: Nurse Practitioner

## 2017-11-20 ENCOUNTER — Other Ambulatory Visit: Payer: Self-pay | Admitting: Nurse Practitioner

## 2017-12-01 ENCOUNTER — Other Ambulatory Visit: Payer: Self-pay | Admitting: Nurse Practitioner

## 2017-12-01 DIAGNOSIS — E119 Type 2 diabetes mellitus without complications: Secondary | ICD-10-CM

## 2017-12-02 NOTE — Telephone Encounter (Signed)
OV 12/12/17

## 2017-12-03 ENCOUNTER — Other Ambulatory Visit: Payer: PPO

## 2017-12-03 DIAGNOSIS — E782 Mixed hyperlipidemia: Secondary | ICD-10-CM

## 2017-12-03 LAB — LIPID PANEL
CHOLESTEROL TOTAL: 153 mg/dL (ref 100–199)
Chol/HDL Ratio: 5.7 ratio — ABNORMAL HIGH (ref 0.0–5.0)
HDL: 27 mg/dL — ABNORMAL LOW (ref >39)
LDL CALC: 91 mg/dL (ref 0–99)
TRIGLYCERIDES: 177 mg/dL — AB (ref 0–149)
VLDL CHOLESTEROL CAL: 35 mg/dL (ref 5–40)

## 2017-12-03 LAB — HEPATIC FUNCTION PANEL
ALBUMIN: 4.6 g/dL (ref 3.5–4.8)
ALK PHOS: 57 IU/L (ref 39–117)
ALT: 11 IU/L (ref 0–44)
AST: 13 IU/L (ref 0–40)
Bilirubin Total: 0.3 mg/dL (ref 0.0–1.2)
Bilirubin, Direct: 0.1 mg/dL (ref 0.00–0.40)
Total Protein: 7.1 g/dL (ref 6.0–8.5)

## 2017-12-05 ENCOUNTER — Encounter: Payer: Self-pay | Admitting: Pharmacist

## 2017-12-05 ENCOUNTER — Ambulatory Visit (INDEPENDENT_AMBULATORY_CARE_PROVIDER_SITE_OTHER): Payer: PPO | Admitting: Pharmacist

## 2017-12-05 DIAGNOSIS — E782 Mixed hyperlipidemia: Secondary | ICD-10-CM | POA: Diagnosis not present

## 2017-12-05 MED ORDER — ATORVASTATIN CALCIUM 10 MG PO TABS: 10.0000 mg | ORAL_TABLET | Freq: Every day | ORAL | 3 refills | Status: DC

## 2017-12-05 NOTE — Patient Instructions (Signed)
Your LDL (bad cholesterol) is now at goal. Congratulations!!!  Your triglycerides are slightly above goal. Avoid alcohol, limit carbohydrates and sugars.    Follow up with Dr. Radford Pax as recommended (June 2020).   Cholesterol Cholesterol is a fat. Your body needs a small amount of cholesterol. Cholesterol (plaque) may build up in your blood vessels (arteries). That makes you more likely to have a heart attack or stroke. You cannot feel your cholesterol level. Having a blood test is the only way to find out if your level is high. Keep your test results. Work with your doctor to keep your cholesterol at a good level. What do the results mean?  Total cholesterol is how much cholesterol is in your blood.  LDL is bad cholesterol. This is the type that can build up. Try to have low LDL.  HDL is good cholesterol. It cleans your blood vessels and carries LDL away. Try to have high HDL.  Triglycerides are fat that the body can store or burn for energy. What are good levels of cholesterol?  Total cholesterol below 200.  LDL below 100 is good for people who have health risks. LDL below 70 is good for people who have very high risks.  HDL above 40 is good. It is best to have HDL of 60 or higher.  Triglycerides below 150. How can I lower my cholesterol? Diet Follow your diet program as told by your doctor.  Choose fish, white meat chicken, or [place] that is roasted or baked. Try not to eat red meat, fried foods, sausage, or lunch meats.  Eat lots of fresh fruits and vegetables.  Choose whole grains, beans, pasta, potatoes, and cereals.  Choose olive oil, corn oil, or canola oil. Only use small amounts.  Try not to eat butter, mayonnaise, shortening, or palm kernel oils.  Try not to eat foods with trans fats.  Choose low-fat or nonfat dairy foods. ? Drink skim or nonfat milk. ? Eat low-fat or nonfat yogurt and cheeses. ? Try not to drink whole milk or cream. ? Try not to eat ice  cream, egg yolks, or full-fat cheeses.  Healthy desserts include angel food cake, ginger snaps, animal crackers, hard candy, popsicles, and low-fat or nonfat frozen yogurt. Try not to eat pastries, cakes, pies, and cookies.  Exercise Follow your exercise program as told by your doctor.  Be more active. Try gardening, walking, and taking the stairs.  Ask your doctor about ways that you can be more active.  Medicine  Take over-the-counter and prescription medicines only as told by your doctor. This information is not intended to replace advice given to you by your health care provider. Make sure you discuss any questions you have with your health care provider. Document Released: 06/08/2008 Document Revised: 10/12/2015 Document Reviewed: 09/22/2015 Elsevier Interactive Patient Education  [employer].

## 2017-12-05 NOTE — Progress Notes (Signed)
Patient ID: Peter York                 DOB: 03-02-1946                    MRN: [id number]     HPI: Peter York is a 72 y.o. male patient of Dr. Radford York that presents today for lipid evaluation.  PMH includes HTN, DM and dyslipidemia. His current ASCVD 10-year risk is 55.4% and thus should continue aggressive risk modification. He has been unable to tolerate Crestor and Livalo in the past due to muscle aches. At his most recent visit he was restarted on atorvastatin 10mg  daily.   He presents today for follow up discussion of cholesterol. He states he has done well on the atorvastatin. He denies any symptoms.   Risk Factors: HTN, DM  LDL Goal: <100 given 10-year risk, nonHDL <130  Current Medications: none Intolerances: Crestor and Livalo 4mg  daily (muslce aches, legs, back)  Diet: He does not eat meat much. He eats steak about 2 times per month. He does eat chicken and fish, but enjoys vegetables. He prepared most boiled and occasional fried. He does add butter occasionally. He drinks mostly water and some diet drinks.   Exercise: He is very active.   Family History: Cancer (age of onset: 4) in his father; Colon cancer (age of onset: 103) in his father; Colon polyps in his father; Diabetes (age of onset: 29) in his daughter; Heart attack in his maternal uncle; Heart disease in his maternal uncle, maternal uncle, and maternal uncle; Transient ischemic attack (age of onset: 81) in his brother.  Social History: former smoker   Labs: 12/03/17:  TC 153, TG 177, HDL 27, LDL 91, nonHDL 126 (atorvastatin 10mg  daily) 09/05/17:  TC 245, TG 176, HDL 29, LDL 181, nonHDL 216 (no therapy)  Past Medical History:  Diagnosis Date  . Adenomatous colon polyp 12/31/2005  . Diabetes mellitus   . Hypercholesterolemia   . Hypertension     Current Outpatient Medications on File Prior to Visit  Medication Sig Dispense Refill  . alendronate (FOSAMAX) 70 MG tablet Take 1 tablet (70 mg total) by mouth every  7 (seven) days. Take with a full glass of water on an empty stomach. 12 tablet 2  . amLODipine (NORVASC) 10 MG tablet Take 1 tablet (10 mg total) by mouth daily. 90 tablet 3  . aspirin 81 MG tablet Take 81 mg by mouth daily.    [name] atorvastatin (LIPITOR) 10 MG tablet Take 1 tablet (10 mg total) by mouth daily. 30 tablet 3  . chlorthalidone (HYGROTON) 25 MG tablet Take 1 tablet (25 mg total) by mouth daily. 90 tablet 3  . cholecalciferol (VITAMIN D) 1000 units tablet Take 1 tablet (1,000 Units total) by mouth daily.    . cloNIDine (CATAPRES) 0.1 MG tablet Take 1 tablet (0.1 mg total) by mouth 2 (two) times daily. 180 tablet 3  . colchicine 0.6 MG tablet TAKE 2 TABLETS BY MOUTH AT GOUT ONSET. MAY REPEAT 1 TABLET 2 HOURS LATER (ONCE IN 24 HOURS) 90 tablet 0  . glimepiride (AMARYL) 4 MG tablet TAKE 1 TABLET (4 MG TOTAL) BY MOUTH DAILY WITH BREAKFAST. 90 tablet 0  . glucose blood (ONE TOUCH ULTRA TEST) test strip USE TO CHECK BLOOD SUGAR ONCE A DAY AS INSTRUCTED 100 each 2  . losartan (COZAAR) 50 MG tablet Take 1 tablet (50 mg total) by mouth daily. 90 tablet 1  .  metFORMIN (GLUCOPHAGE) 500 MG tablet Take 1 tablet (500 mg total) by mouth 2 (two) times daily with a meal. 180 tablet 1  . Omega-3 Fatty Acids (FISH OIL) 1000 MG CAPS Take 2,000 mg by mouth 2 (two) times daily.    . vitamin E 400 UNIT capsule Take 1 capsule (400 Units total) by mouth daily. 30 capsule 3   No current facility-administered medications on file prior to visit.     Allergies  Allergen Reactions  . Benazepril     hyperkalemia  . Crestor [Rosuvastatin Calcium]     "like to killed me." joint pain, unable to get OOB    Assessment/Plan: Hyperlipidemia: LDL and nonHDL at goal on atorvastatin 10mg  daily. Will continue therapy as prescribed. TG just slightly above goal <150. Discussed watching diet and decreasing carbohydrates. He will continue to work on this and follow up with Dr. Radford York as scheduled next year and lipid clinic if  needed.    Thank you,  Peter York, [facility]  12/05/2017 8:36 AM

## 2017-12-06 DIAGNOSIS — E119 Type 2 diabetes mellitus without complications: Secondary | ICD-10-CM | POA: Diagnosis not present

## 2017-12-06 DIAGNOSIS — H40033 Anatomical narrow angle, bilateral: Secondary | ICD-10-CM | POA: Diagnosis not present

## 2017-12-06 LAB — HM DIABETES EYE EXAM

## 2017-12-12 ENCOUNTER — Ambulatory Visit (INDEPENDENT_AMBULATORY_CARE_PROVIDER_SITE_OTHER): Payer: PPO | Admitting: Nurse Practitioner

## 2017-12-12 ENCOUNTER — Encounter: Payer: Self-pay | Admitting: Nurse Practitioner

## 2017-12-12 VITALS — BP 133/80 | HR 54 | Temp 97.0°F | Ht 66.0 in | Wt 164.0 lb

## 2017-12-12 DIAGNOSIS — L989 Disorder of the skin and subcutaneous tissue, unspecified: Secondary | ICD-10-CM

## 2017-12-12 DIAGNOSIS — M810 Age-related osteoporosis without current pathological fracture: Secondary | ICD-10-CM

## 2017-12-12 DIAGNOSIS — E119 Type 2 diabetes mellitus without complications: Secondary | ICD-10-CM | POA: Diagnosis not present

## 2017-12-12 DIAGNOSIS — E782 Mixed hyperlipidemia: Secondary | ICD-10-CM

## 2017-12-12 DIAGNOSIS — I10 Essential (primary) hypertension: Secondary | ICD-10-CM | POA: Diagnosis not present

## 2017-12-12 LAB — BAYER DCA HB A1C WAIVED: HB A1C (BAYER DCA - WAIVED): 6.5 % (ref <7.0)

## 2017-12-12 MED ORDER — GLIMEPIRIDE 4 MG PO TABS: 4.0000 mg | ORAL_TABLET | Freq: Every day | ORAL | 0 refills | Status: DC

## 2017-12-12 MED ORDER — CLONIDINE HCL 0.1 MG PO TABS: 0.1000 mg | ORAL_TABLET | Freq: Two times a day (BID) | ORAL | 3 refills | Status: DC

## 2017-12-12 MED ORDER — METFORMIN HCL 500 MG PO TABS: 500.0000 mg | ORAL_TABLET | Freq: Two times a day (BID) | ORAL | 1 refills | Status: DC

## 2017-12-12 MED ORDER — LOSARTAN POTASSIUM 50 MG PO TABS: 50.0000 mg | ORAL_TABLET | Freq: Every day | ORAL | 1 refills | Status: DC

## 2017-12-12 MED ORDER — ATORVASTATIN CALCIUM 10 MG PO TABS: 10.0000 mg | ORAL_TABLET | Freq: Every day | ORAL | 3 refills | Status: DC

## 2017-12-12 MED ORDER — CHLORTHALIDONE 25 MG PO TABS: 25.0000 mg | ORAL_TABLET | Freq: Every day | ORAL | 3 refills | Status: DC

## 2017-12-12 MED ORDER — AMLODIPINE BESYLATE 10 MG PO TABS: 10.0000 mg | ORAL_TABLET | Freq: Every day | ORAL | 3 refills | Status: DC

## 2017-12-12 NOTE — Progress Notes (Signed)
Subjective:    Patient ID: Peter York, male    DOB: [birth year], 72 y.o.   MRN: [id number]   Chief Complaint: Medical Management of chronic issues  HPI:  1. Essential hypertension  No c/o chest pain, sob or headache. Does not check blood pressure at home. BP Readings from Last 3 Encounters:  09/05/17 (!) 147/88  08/26/17 136/72  07/22/17 109/62     2. Type 2 diabetes mellitus without complication, without long-term current use of insulin ([place]) last hgab1c was 6.5%. His blood sugars have been running around 140-150. No symptoms of hypoglycemia.  3. Osteoporosis of vertebra  Last dexascan was 03/01/16 with t score of -2.5. He is very active and has hunting dogs and horses and stays very active.  4. Mixed hyperlipidemia  Avoids fried foods most of the time.    Outpatient Encounter Medications as of 12/12/2017  Medication Sig  . alendronate (FOSAMAX) 70 MG tablet Take 1 tablet (70 mg total) by mouth every 7 (seven) days. Take with a full glass of water on an empty stomach.  [name] amLODipine (NORVASC) 10 MG tablet Take 1 tablet (10 mg total) by mouth daily.  [name] aspirin 81 MG tablet Take 81 mg by mouth daily.  [name] atorvastatin (LIPITOR) 10 MG tablet Take 1 tablet (10 mg total) by mouth daily.  . chlorthalidone (HYGROTON) 25 MG tablet Take 1 tablet (25 mg total) by mouth daily.  . cholecalciferol (VITAMIN D) 1000 units tablet Take 1 tablet (1,000 Units total) by mouth daily.  . cloNIDine (CATAPRES) 0.1 MG tablet Take 1 tablet (0.1 mg total) by mouth 2 (two) times daily.  . colchicine 0.6 MG tablet TAKE 2 TABLETS BY MOUTH AT GOUT ONSET. MAY REPEAT 1 TABLET 2 HOURS LATER (ONCE IN 24 HOURS)  . glimepiride (AMARYL) 4 MG tablet TAKE 1 TABLET (4 MG TOTAL) BY MOUTH DAILY WITH BREAKFAST.  [name] glucose blood (ONE TOUCH ULTRA TEST) test strip USE TO CHECK BLOOD SUGAR ONCE A DAY AS INSTRUCTED  . losartan (COZAAR) 50 MG tablet Take 1 tablet (50 mg total) by mouth daily.  . metFORMIN (GLUCOPHAGE) 500 MG  tablet Take 1 tablet (500 mg total) by mouth 2 (two) times daily with a meal.  . Omega-3 Fatty Acids (FISH OIL) 1000 MG CAPS Take 2,000 mg by mouth 2 (two) times daily.  . vitamin E 400 UNIT capsule Take 1 capsule (400 Units total) by mouth daily.      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  HENT: Negative.   Eyes: Negative for pain.  Respiratory: Negative for shortness of breath.   Cardiovascular: Negative for chest pain, palpitations and leg swelling.  Gastrointestinal: Negative for abdominal pain.  Endocrine: Negative for polydipsia.  Genitourinary: Negative.   Skin: Negative for rash.  Neurological: Negative for dizziness, weakness and headaches.  Hematological: Does not bruise/bleed easily.  Psychiatric/Behavioral: Negative.   All other systems reviewed and are negative.      Objective:   Physical Exam  Constitutional: He is oriented to person, place, and time. He appears well-developed and well-nourished.  HENT:  Head: Normocephalic.  Nose: Nose normal.  Mouth/Throat: Oropharynx is clear and moist.  Eyes: Pupils are equal, round, and reactive to light. EOM are normal.  Neck: Normal range of motion and phonation normal. Neck supple. No JVD present. Carotid  bruit is not present. No thyroid mass and no thyromegaly present.  Cardiovascular: Normal rate and regular rhythm.  Pulmonary/Chest: Effort normal and breath sounds normal. No respiratory distress.  Abdominal: Soft. Normal appearance, normal aorta and bowel sounds are normal. There is no tenderness.  Musculoskeletal: Normal range of motion.  Lymphadenopathy:    He has no cervical adenopathy.  Neurological: He is alert and oriented to person, place, and time.  Skin: Skin is warm and dry.  2cm papular scaley scalp lesion.  Psychiatric: He has a normal  mood and affect. His behavior is normal. Judgment and thought content normal.  Nursing note and vitals reviewed.  BP 133/80   Pulse (!) 54   Temp (!) 97 F (36.1 C) (Oral)   Ht 5' 6"  (1.676 m)   Wt 164 lb (74.4 kg)   BMI 26.47 kg/m   HGBA1c 6.5%     Assessment & Plan:  RAEGAN SIPP comes in today with chief complaint of No chief complaint on file.   Diagnosis and orders addressed:  1. Essential hypertension Low sodium diet - CMP14+EGFR - chlorthalidone (HYGROTON) 25 MG tablet; Take 1 tablet (25 mg total) by mouth daily.  Dispense: 90 tablet; Refill: 3 - amLODipine (NORVASC) 10 MG tablet; Take 1 tablet (10 mg total) by mouth daily.  Dispense: 90 tablet; Refill: 3 - cloNIDine (CATAPRES) 0.1 MG tablet; Take 1 tablet (0.1 mg total) by mouth 2 (two) times daily.  Dispense: 180 tablet; Refill: 3  2. Type 2 diabetes mellitus without complication, without long-term current use of insulin (HCC) Continue to watch carbs in diet - Bayer DCA Hb A1c Waived - glimepiride (AMARYL) 4 MG tablet; Take 1 tablet (4 mg total) by mouth daily with breakfast.  Dispense: 90 tablet; Refill: 0 - metFORMIN (GLUCOPHAGE) 500 MG tablet; Take 1 tablet (500 mg total) by mouth 2 (two) times daily with a meal.  Dispense: 180 tablet; Refill: 1  3. Osteoporosis of vertebra Weight bearing exercises encouraged  4. Mixed hyperlipidemia Continue low fat diet  5. Scalp lesion Do not pick or scratch Wear hat when out in the sun - Ambulatory referral to Dermatology   Labs pending Health Maintenance reviewed Diet and exercise encouraged  Follow up plan: 3 months   [place], FNP

## 2017-12-12 NOTE — Patient Instructions (Signed)
Bone Health Bones protect organs, store calcium, and anchor muscles. Good health habits, such as eating nutritious foods and exercising regularly, are important for maintaining healthy bones. They can also help to prevent a condition that causes bones to lose density and become weak and brittle (osteoporosis). Why is bone mass important? Bone mass refers to the amount of bone tissue that you have. The higher your bone mass, the stronger your bones. An important step toward having healthy bones throughout life is to have strong and dense bones during childhood. A young adult who has a high bone mass is more likely to have a high bone mass later in life. Bone mass at its greatest it is called peak bone mass. A large decline in bone mass occurs in older adults. In women, it occurs about the time of menopause. During this time, it is important to practice good health habits, because if more bone is lost than what is replaced, the bones will become less healthy and more likely to break (fracture). If you find that you have a low bone mass, you may be able to prevent osteoporosis or further bone loss by changing your diet and lifestyle. How can I find out if my bone mass is low? Bone mass can be measured with an X-ray test that is called a bone mineral density (BMD) test. This test is recommended for all women who are age 65 or older. It may also be recommended for men who are age 70 or older, or for people who are more likely to develop osteoporosis due to:  Having bones that break easily.  Having a long-term disease that weakens bones, such as kidney disease or rheumatoid arthritis.  Having menopause earlier than normal.  Taking medicine that weakens bones, such as steroids, thyroid hormones, or hormone treatment for breast cancer or prostate cancer.  Smoking.  Drinking three or more alcoholic drinks each day.  What are the nutritional recommendations for healthy bones? To have healthy bones, you  need to get enough of the right minerals and vitamins. Most nutrition experts recommend getting these nutrients from the foods that you eat. Nutritional recommendations vary from person to person. Ask your health care provider what is healthy for you. Here are some general guidelines. Calcium Recommendations Calcium is the most important (essential) mineral for bone health. Most people can get enough calcium from their diet, but supplements may be recommended for people who are at risk for osteoporosis. Good sources of calcium include:  Dairy products, such as low-fat or nonfat milk, cheese, and yogurt.  Dark green leafy vegetables, such as bok choy and broccoli.  Calcium-fortified foods, such as orange juice, cereal, bread, soy beverages, and tofu products.  Nuts, such as almonds.  Follow these recommended amounts for daily calcium intake:  Children, age 1?3: 700 mg.  Children, age 4?8: 1,000 mg.  Children, age 9?13: 1,300 mg.  Teens, age 14?18: 1,300 mg.  Adults, age 19?50: 1,000 mg.  Adults, age 51?70: ? Men: 1,000 mg. ? Women: 1,200 mg.  Adults, age 71 or older: 1,200 mg.  Pregnant and breastfeeding females: ? Teens: 1,300 mg. ? Adults: 1,000 mg.  Vitamin D Recommendations Vitamin D is the most essential vitamin for bone health. It helps the body to absorb calcium. Sunlight stimulates the skin to make vitamin D, so be sure to get enough sunlight. If you live in a cold climate or you do not get outside often, your health care provider may recommend that you take vitamin   D supplements. Good sources of vitamin D in your diet include:  Egg yolks.  Saltwater fish.  Milk and cereal fortified with vitamin D.  Follow these recommended amounts for daily vitamin D intake:  Children and teens, age 1?18: 600 international units.  Adults, age 50 or younger: 400-800 international units.  Adults, age 51 or older: 800-1,000 international units.  Other Nutrients Other nutrients  for bone health include:  Phosphorus. This mineral is found in meat, poultry, dairy foods, nuts, and legumes. The recommended daily intake for adult men and adult women is 700 mg.  Magnesium. This mineral is found in seeds, nuts, dark green vegetables, and legumes. The recommended daily intake for adult men is 400?420 mg. For adult women, it is 310?320 mg.  Vitamin K. This vitamin is found in green leafy vegetables. The recommended daily intake is 120 mg for adult men and 90 mg for adult women.  What type of physical activity is best for building and maintaining healthy bones? Weight-bearing and strength-building activities are important for building and maintaining peak bone mass. Weight-bearing activities cause muscles and bones to work against gravity. Strength-building activities increases muscle strength that supports bones. Weight-bearing and muscle-building activities include:  Walking and hiking.  Jogging and running.  Dancing.  Gym exercises.  Lifting weights.  Tennis and racquetball.  Climbing stairs.  Aerobics.  Adults should get at least 30 minutes of moderate physical activity on most days. Children should get at least 60 minutes of moderate physical activity on most days. Ask your health care provide what type of exercise is best for you. Where can I find more information? For more information, check out the following websites:  National Osteoporosis Foundation: http://nof.org/learn/basics  National Institutes of Health: http://www.niams.nih.gov/Health_Info/Bone/Bone_Health/bone_health_for_life.asp  This information is not intended to replace advice given to you by your health care provider. Make sure you discuss any questions you have with your health care provider. Document Released: 06/02/2003 Document Revised: 09/30/2015 Document Reviewed: 03/17/2014 Elsevier Interactive Patient Education  2018 [employer].  

## 2017-12-13 LAB — CMP14+EGFR
ALBUMIN: 4.3 g/dL (ref 3.5–4.8)
ALT: 11 IU/L (ref 0–44)
AST: 14 IU/L (ref 0–40)
Albumin/Globulin Ratio: 1.6 (ref 1.2–2.2)
Alkaline Phosphatase: 59 IU/L (ref 39–117)
BILIRUBIN TOTAL: 0.4 mg/dL (ref 0.0–1.2)
BUN / CREAT RATIO: 17 (ref 10–24)
BUN: 28 mg/dL — AB (ref 8–27)
CHLORIDE: 103 mmol/L (ref 96–106)
CO2: 21 mmol/L (ref 20–29)
Calcium: 9.3 mg/dL (ref 8.6–10.2)
Creatinine, Ser: 1.69 mg/dL — ABNORMAL HIGH (ref 0.76–1.27)
GFR calc Af Amer: 46 mL/min/{1.73_m2} — ABNORMAL LOW (ref >59)
GFR calc non Af Amer: 40 mL/min/{1.73_m2} — ABNORMAL LOW (ref >59)
GLOBULIN, TOTAL: 2.7 g/dL (ref 1.5–4.5)
GLUCOSE: 139 mg/dL — AB (ref 65–99)
Potassium: 5.2 mmol/L (ref 3.5–5.2)
SODIUM: 138 mmol/L (ref 134–144)
Total Protein: 7 g/dL (ref 6.0–8.5)

## 2018-03-11 DIAGNOSIS — R972 Elevated prostate specific antigen [PSA]: Secondary | ICD-10-CM | POA: Diagnosis not present

## 2018-03-17 ENCOUNTER — Encounter: Payer: Self-pay | Admitting: Nurse Practitioner

## 2018-03-17 ENCOUNTER — Ambulatory Visit (INDEPENDENT_AMBULATORY_CARE_PROVIDER_SITE_OTHER): Payer: PPO | Admitting: Nurse Practitioner

## 2018-03-17 VITALS — BP 125/78 | HR 60 | Temp 97.3°F | Ht 66.0 in | Wt 166.0 lb

## 2018-03-17 DIAGNOSIS — I10 Essential (primary) hypertension: Secondary | ICD-10-CM

## 2018-03-17 DIAGNOSIS — E782 Mixed hyperlipidemia: Secondary | ICD-10-CM | POA: Diagnosis not present

## 2018-03-17 DIAGNOSIS — E119 Type 2 diabetes mellitus without complications: Secondary | ICD-10-CM

## 2018-03-17 LAB — BAYER DCA HB A1C WAIVED: HB A1C (BAYER DCA - WAIVED): 7.2 % — ABNORMAL HIGH

## 2018-03-17 MED ORDER — GLIMEPIRIDE 4 MG PO TABS: 4.0000 mg | ORAL_TABLET | Freq: Every day | ORAL | 0 refills | Status: DC

## 2018-03-17 MED ORDER — METFORMIN HCL 500 MG PO TABS: 500.0000 mg | ORAL_TABLET | Freq: Two times a day (BID) | ORAL | 1 refills | Status: DC

## 2018-03-17 MED ORDER — LOSARTAN POTASSIUM 25 MG PO TABS: 50.0000 mg | ORAL_TABLET | Freq: Every day | ORAL | 1 refills | Status: DC

## 2018-03-17 NOTE — Progress Notes (Signed)
Subjective:    Patient ID: Peter York, male    DOB: 1945-08-19, 72 y.o.   MRN: [id number]   Chief Complaint: medical management of chronic issues  HPI:  1. Essential hypertension  No c/o chest  Pain, so and headache. Does not check blood pressure at home  2. Type 2 diabetes mellitus without complication, without long-term current use of insulin (HCC) last hgab1c was 6.5. blood sugars have been below 130 fasting  3. Mixed hyperlipidemia  Watches doiet most days. Stays very active and busy. 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    Outpatient Encounter Medications as of 03/17/2018  Medication Sig  . alendronate (FOSAMAX) 70 MG tablet Take 1 tablet (70 mg total) by mouth every 7 (seven) days. Take with a full glass of water on an empty stomach.  [name] amLODipine (NORVASC) 10 MG tablet Take 1 tablet (10 mg total) by mouth daily.  [name] aspirin 81 MG tablet Take 81 mg by mouth daily.  [name] atorvastatin (LIPITOR) 10 MG tablet Take 1 tablet (10 mg total) by mouth daily.  . chlorthalidone (HYGROTON) 25 MG tablet Take 1 tablet (25 mg total) by mouth daily.  . cholecalciferol (VITAMIN D) 1000 units tablet Take 1 tablet (1,000 Units total) by mouth daily.  . cloNIDine (CATAPRES) 0.1 MG tablet Take 1 tablet (0.1 mg total) by mouth 2 (two) times daily.  . colchicine 0.6 MG tablet TAKE 2 TABLETS BY MOUTH AT GOUT ONSET. MAY REPEAT 1 TABLET 2 HOURS LATER (ONCE IN 24 HOURS)  . glimepiride (AMARYL) 4 MG tablet Take 1 tablet (4 mg total) by mouth daily with breakfast.  . glucose blood (ONE TOUCH ULTRA TEST) test strip USE TO CHECK BLOOD SUGAR ONCE A DAY AS INSTRUCTED  . losartan (COZAAR) 50 MG tablet Take 1 tablet (50 mg total) by mouth daily.  . metFORMIN (GLUCOPHAGE) 500 MG tablet Take 1 tablet (500 mg total) by mouth 2 (two) times daily with a meal.  . Omega-3 Fatty Acids (FISH OIL) 1000 MG CAPS Take 2,000 mg by mouth 2 (two) times daily.  . vitamin E 400 UNIT capsule Take 1 capsule (400 Units total) by mouth  daily.     New complaints: None today  Social history: Lives with wife and daughter. 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  Review of Systems  Constitutional: Negative for activity change and appetite change.  HENT: Negative.   Eyes: Negative for pain.  Respiratory: Negative for shortness of breath.   Cardiovascular: Negative for chest pain, palpitations and leg swelling.  Gastrointestinal: Negative for abdominal pain.  Endocrine: Negative for polydipsia.  Genitourinary: Negative.   Skin: Negative for rash.  Neurological: Negative for dizziness, weakness and headaches.  Hematological: Does not bruise/bleed easily.  Psychiatric/Behavioral: Negative.   All other systems reviewed and are negative.      Objective:   Physical Exam Vitals signs and nursing note reviewed.  Constitutional:      Appearance: Normal appearance. He is well-developed.  HENT:     Head: Normocephalic.     Nose: Nose normal.  Eyes:     Pupils: Pupils are equal, round, and reactive to light.  Neck:     Musculoskeletal: Normal range of motion and neck supple.     Thyroid: No thyroid mass or thyromegaly.     Vascular: No carotid bruit or JVD.     Trachea: Phonation normal.  Cardiovascular:     Rate and Rhythm: Normal rate and  regular rhythm.  Pulmonary:     Effort: Pulmonary effort is normal. No respiratory distress.     Breath sounds: Normal breath sounds.  Abdominal:     General: Bowel sounds are normal.     Palpations: Abdomen is soft.     Tenderness: There is no abdominal tenderness.  Musculoskeletal: Normal range of motion.  Lymphadenopathy:     Cervical: No cervical adenopathy.  Skin:    General: Skin is warm and dry.  Neurological:     Mental Status: He is alert and oriented to person, place, and time.  Psychiatric:        Behavior: Behavior normal.        Thought Content: Thought content normal.        Judgment: Judgment normal.    BP 125/78   Pulse  60   Temp (!) 97.3 F (36.3 C) (Oral)   Ht 5\' 6"  (1.676 m)   Wt 166 lb (75.3 kg)   BMI 26.79 kg/m    hgba1c 7.2     Assessment & Plan:  Peter York comes in today with chief complaint of Medical Management of Chronic Issues   Diagnosis and orders addressed:  1. Essential hypertension Low sodium diet - losartan (COZAAR) 25 MG tablet; Take 2 tablets (50 mg total) by mouth daily.  Dispense: 180 tablet; Refill: 1  2. Type 2 diabetes mellitus without complication, without long-term current use of insulin (HCC) Continue to watch carbs in diet - metFORMIN (GLUCOPHAGE) 500 MG tablet; Take 1 tablet (500 mg total) by mouth 2 (two) times daily with a meal.  Dispense: 180 tablet; Refill: 1 - glimepiride (AMARYL) 4 MG tablet; Take 1 tablet (4 mg total) by mouth daily with breakfast.  Dispense: 90 tablet; Refill: 0  3. Mixed hyperlipidemia Low fat diet   Labs pending Health Maintenance reviewed Diet and exercise encouraged  Follow up plan: 3 months   Mary-Margaret Hassell Done, FNP

## 2018-03-17 NOTE — Patient Instructions (Signed)
Diabetes Mellitus and Foot Care  Foot care is an important part of your health, especially when you have diabetes. Diabetes may cause you to have problems because of poor blood flow (circulation) to your feet and legs, which can cause your skin to:   Become thinner and drier.   Break more easily.   Heal more slowly.   Peel and crack.  You may also have nerve damage (neuropathy) in your legs and feet, causing decreased feeling in them. This means that you may not notice minor injuries to your feet that could lead to more serious problems. Noticing and addressing any potential problems early is the best way to prevent future foot problems.  How to care for your feet  Foot hygiene   Wash your feet daily with warm water and mild soap. Do not use hot water. Then, pat your feet and the areas between your toes until they are completely dry. Do not soak your feet as this can dry your skin.   Trim your toenails straight across. Do not dig under them or around the cuticle. File the edges of your nails with an emery board or nail file.   Apply a moisturizing lotion or petroleum jelly to the skin on your feet and to dry, brittle toenails. Use lotion that does not contain alcohol and is unscented. Do not apply lotion between your toes.  Shoes and socks   Wear clean socks or stockings every day. Make sure they are not too tight. Do not wear knee-high stockings since they may decrease blood flow to your legs.   Wear shoes that fit properly and have enough cushioning. Always look in your shoes before you put them on to be sure there are no objects inside.   To break in new shoes, wear them for just a few hours a day. This prevents injuries on your feet.  Wounds, scrapes, corns, and calluses   Check your feet daily for blisters, cuts, bruises, sores, and redness. If you cannot see the bottom of your feet, use a mirror or ask someone for help.   Do not cut corns or calluses or try to remove them with medicine.   If you  find a minor scrape, cut, or break in the skin on your feet, keep it and the skin around it clean and dry. You may clean these areas with mild soap and water. Do not clean the area with peroxide, alcohol, or iodine.   If you have a wound, scrape, corn, or callus on your foot, look at it several times a day to make sure it is healing and not infected. Check for:  ? Redness, swelling, or pain.  ? Fluid or blood.  ? Warmth.  ? Pus or a bad smell.  General instructions   Do not cross your legs. This may decrease blood flow to your feet.   Do not use heating pads or hot water bottles on your feet. They may burn your skin. If you have lost feeling in your feet or legs, you may not know this is happening until it is too late.   Protect your feet from hot and cold by wearing shoes, such as at the beach or on hot pavement.   Schedule a complete foot exam at least once a year (annually) or more often if you have foot problems. If you have foot problems, report any cuts, sores, or bruises to your health care provider immediately.  Contact a health care provider if:     You have a medical condition that increases your risk of infection and you have any cuts, sores, or bruises on your feet.   You have an injury that is not healing.   You have redness on your legs or feet.   You feel burning or tingling in your legs or feet.   You have pain or cramps in your legs and feet.   Your legs or feet are numb.   Your feet always feel cold.   You have pain around a toenail.  Get help right away if:   You have a wound, scrape, corn, or callus on your foot and:  ? You have pain, swelling, or redness that gets worse.  ? You have fluid or blood coming from the wound, scrape, corn, or callus.  ? Your wound, scrape, corn, or callus feels warm to the touch.  ? You have pus or a bad smell coming from the wound, scrape, corn, or callus.  ? You have a fever.  ? You have a red line going up your leg.  Summary   Check your feet every day  for cuts, sores, red spots, swelling, and blisters.   Moisturize feet and legs daily.   Wear shoes that fit properly and have enough cushioning.   If you have foot problems, report any cuts, sores, or bruises to your health care provider immediately.   Schedule a complete foot exam at least once a year (annually) or more often if you have foot problems.  This information is not intended to replace advice given to you by your health care provider. Make sure you discuss any questions you have with your health care provider.  Document Released: 03/09/2000 Document Revised: 04/24/2017 Document Reviewed: 04/13/2016  Elsevier Interactive Patient Education  2019 [employer]

## 2018-03-17 NOTE — Addendum Note (Signed)
Addended by: Chevis Pretty on: 03/17/2018 02:39 PM   Modules accepted: Orders

## 2018-03-18 LAB — SPECIMEN STATUS

## 2018-03-20 LAB — CMP14+EGFR
A/G RATIO: 1.7 (ref 1.2–2.2)
ALT: 17 IU/L (ref 0–44)
AST: 14 IU/L (ref 0–40)
Albumin: 4.3 g/dL (ref 3.5–4.8)
Alkaline Phosphatase: 68 IU/L (ref 39–117)
BILIRUBIN TOTAL: 0.3 mg/dL (ref 0.0–1.2)
BUN/Creatinine Ratio: 16 (ref 10–24)
BUN: 28 mg/dL — ABNORMAL HIGH (ref 8–27)
CALCIUM: 9.1 mg/dL (ref 8.6–10.2)
CHLORIDE: 100 mmol/L (ref 96–106)
CO2: 22 mmol/L (ref 20–29)
Creatinine, Ser: 1.76 mg/dL — ABNORMAL HIGH (ref 0.76–1.27)
GFR, EST AFRICAN AMERICAN: 44 mL/min/{1.73_m2} — AB (ref >59)
GFR, EST NON AFRICAN AMERICAN: 38 mL/min/{1.73_m2} — AB (ref >59)
GLOBULIN, TOTAL: 2.5 g/dL (ref 1.5–4.5)
Glucose: 160 mg/dL — ABNORMAL HIGH (ref 65–99)
POTASSIUM: 4.7 mmol/L (ref 3.5–5.2)
SODIUM: 138 mmol/L (ref 134–144)
TOTAL PROTEIN: 6.8 g/dL (ref 6.0–8.5)

## 2018-03-20 LAB — LIPID PANEL
CHOL/HDL RATIO: 5.3 ratio — AB (ref 0.0–5.0)
Cholesterol, Total: 170 mg/dL (ref 100–199)
HDL: 32 mg/dL — AB (ref >39)
LDL Calculated: 98 mg/dL (ref 0–99)
Triglycerides: 199 mg/dL — ABNORMAL HIGH (ref 0–149)
VLDL Cholesterol Cal: 40 mg/dL (ref 5–40)

## 2018-03-20 LAB — SPECIMEN STATUS REPORT

## 2018-03-25 DIAGNOSIS — N400 Enlarged prostate without lower urinary tract symptoms: Secondary | ICD-10-CM | POA: Diagnosis not present

## 2018-03-25 DIAGNOSIS — R972 Elevated prostate specific antigen [PSA]: Secondary | ICD-10-CM | POA: Diagnosis not present

## 2018-03-26 DIAGNOSIS — I629 Nontraumatic intracranial hemorrhage, unspecified: Secondary | ICD-10-CM

## 2018-03-26 HISTORY — DX: Nontraumatic intracranial hemorrhage, unspecified: I62.9

## 2018-03-31 ENCOUNTER — Encounter: Payer: Self-pay | Admitting: Internal Medicine

## 2018-05-29 ENCOUNTER — Other Ambulatory Visit: Payer: Self-pay | Admitting: Nurse Practitioner

## 2018-06-18 ENCOUNTER — Other Ambulatory Visit: Payer: Self-pay

## 2018-06-18 ENCOUNTER — Telehealth (INDEPENDENT_AMBULATORY_CARE_PROVIDER_SITE_OTHER): Payer: PPO | Admitting: Nurse Practitioner

## 2018-06-18 DIAGNOSIS — E782 Mixed hyperlipidemia: Secondary | ICD-10-CM

## 2018-06-18 DIAGNOSIS — I10 Essential (primary) hypertension: Secondary | ICD-10-CM

## 2018-06-18 DIAGNOSIS — M810 Age-related osteoporosis without current pathological fracture: Secondary | ICD-10-CM | POA: Diagnosis not present

## 2018-06-18 DIAGNOSIS — E119 Type 2 diabetes mellitus without complications: Secondary | ICD-10-CM | POA: Diagnosis not present

## 2018-06-18 MED ORDER — AMLODIPINE BESYLATE 10 MG PO TABS: 10.0000 mg | ORAL_TABLET | Freq: Every day | ORAL | 3 refills | Status: DC

## 2018-06-18 MED ORDER — CHLORTHALIDONE 25 MG PO TABS: 25.0000 mg | ORAL_TABLET | Freq: Every day | ORAL | 3 refills | Status: DC

## 2018-06-18 MED ORDER — ATORVASTATIN CALCIUM 10 MG PO TABS: 10.0000 mg | ORAL_TABLET | Freq: Every day | ORAL | 3 refills | Status: DC

## 2018-06-18 MED ORDER — ALENDRONATE SODIUM 70 MG PO TABS: 70.0000 mg | ORAL_TABLET | ORAL | 2 refills | Status: DC

## 2018-06-18 MED ORDER — GLIMEPIRIDE 4 MG PO TABS: 4.0000 mg | ORAL_TABLET | Freq: Every day | ORAL | 0 refills | Status: DC

## 2018-06-18 MED ORDER — LOSARTAN POTASSIUM 25 MG PO TABS: 50.0000 mg | ORAL_TABLET | Freq: Every day | ORAL | 1 refills | Status: DC

## 2018-06-18 MED ORDER — METFORMIN HCL 500 MG PO TABS: 500.0000 mg | ORAL_TABLET | Freq: Two times a day (BID) | ORAL | 1 refills | Status: DC

## 2018-06-18 MED ORDER — CLONIDINE HCL 0.1 MG PO TABS: 0.1000 mg | ORAL_TABLET | Freq: Every day | ORAL | 1 refills | Status: DC

## 2018-06-18 NOTE — Progress Notes (Signed)
Virtual Visit via telephone Note  I connected with Peter York on 06/18/18 at 8:05 by telephone and verified that I am speaking with the correct person using two identifiers. Peter York is currently located at home and wife is currently with her during visit. The provider, Mary-Margaret Hassell Done, FNP is located in their office at time of visit.  I discussed the limitations, risks, security and privacy concerns of performing an evaluation and management service by telephone and the availability of in person appointments. I also discussed with the patient that there may be a patient responsible charge related to this service. The patient expressed understanding and agreed to proceed.   History and Present Illness:    Chief Complaint:  Medical management of chronic issues  HPI:  1. Essential hypertension  No c/o chest pain, sob or headache.  Does not check blood pressure at home. BP Readings from Last 3 Encounters:  03/17/18 125/78  12/12/17 133/80  09/05/17 (!) 147/88     2. Type 2 diabetes mellitus without complication, without long-term current use of insulin ([place]) last hgba1c was 7.2%. blood [place] was 139 this morning  3. Mixed hyperlipidemia  Watches diet and stays very busy  4.     osteoporosis         He is on fosamax and vitamin d and calcium. 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  Outpatient Encounter Medications as of 06/18/2018  Medication Sig  . alendronate (FOSAMAX) 70 MG tablet Take 1 tablet (70 mg total) by mouth every 7 (seven) days. Take with a full glass of water on an empty stomach.  [name] amLODipine (NORVASC) 10 MG tablet Take 1 tablet (10 mg total) by mouth daily.  [name] aspirin 81 MG tablet Take 81 mg by mouth daily.  [name] atorvastatin (LIPITOR) 10 MG tablet Take 1 tablet (10 mg total) by mouth daily.  . chlorthalidone (HYGROTON) 25 MG tablet Take 1 tablet (25 mg total) by mouth daily.  . cholecalciferol (VITAMIN D) 1000 units tablet  Take 1 tablet (1,000 Units total) by mouth daily.  . cloNIDine (CATAPRES) 0.1 MG tablet Take 0.1 mg by mouth daily.  . colchicine 0.6 MG tablet TAKE 2 TABLETS BY MOUTH AT GOUT ONSET. MAY REPEAT 1 TABLET 2 HOURS LATER (ONCE IN 24 HOURS)  . glimepiride (AMARYL) 4 MG tablet Take 1 tablet (4 mg total) by mouth daily with breakfast.  . glucose blood test strip USE TO CHECK BLOOD SUGAR ONCE A DAY AS INSTRUCTED  . losartan (COZAAR) 25 MG tablet Take 2 tablets (50 mg total) by mouth daily.  . metFORMIN (GLUCOPHAGE) 500 MG tablet Take 1 tablet (500 mg total) by mouth 2 (two) times daily with a meal.  . Omega-3 Fatty Acids (FISH OIL) 1000 MG CAPS Take 2,000 mg by mouth 2 (two) times daily.  . vitamin E 400 UNIT capsule Take 1 capsule (400 Units total) by mouth daily.      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    History obtained from the patient General ROS: negative for - chills, fever, sleep disturbance, weight gain or weight loss Respiratory ROS: negative for - cough or shortness of breath Cardiovascular ROS: no chest pain or dyspnea on exertion Gastrointestinal ROS: no abdominal pain, change in bowel habits, or black or bloody stools Genito-Urinary ROS: no dysuria, trouble voiding, or hematuria Neurological ROS: no TIA or stroke symptoms  Observations/Objective: Alert and oriented .- answers all questions appropriately Self taken:  Bp 130/72  HR 82 Resp 16  BS 139  Assessment and Plan: Peter York comes in today with chief complaint of No chief complaint on file.   Diagnosis and orders addressed:  1. Essential hypertension Low sodium diet - losartan (COZAAR) 25 MG tablet; Take 2 tablets (50 mg total) by mouth daily.  Dispense: 180 tablet; Refill: 1 - chlorthalidone (HYGROTON) 25 MG tablet; Take 1 tablet (25 mg total) by mouth daily.  Dispense: 90 tablet; Refill: 3 - amLODipine (NORVASC) 10 MG tablet; Take 1  tablet (10 mg total) by mouth daily.  Dispense: 90 tablet; Refill: 3 - cloNIDine (CATAPRES) 0.1 MG tablet; Take 1 tablet (0.1 mg total) by mouth daily.  Dispense: 180 tablet; Refill: 1  2. Type 2 diabetes mellitus without complication, without long-term current use of insulin ([facility]CC) continue to watch carbs in diet - glimepiride (AMARYL) 4 MG tablet; Take 1 tablet (4 mg total) by mouth daily with breakfast.  Dispense: 90 tablet; Refill: 0 - metFORMIN (GLUCOPHAGE) 500 MG tablet; Take 1 tablet (500 mg total) by mouth 2 (two) times daily with a meal.  Dispense: 180 tablet; Refill: 1  3. Mixed hyperlipidemia Low fat diet - atorvastatin (LIPITOR) 10 MG tablet; Take 1 tablet (10 mg total) by mouth daily.  Dispense: 90 tablet; Refill: 3  4. Osteoporosis of vertebra Weight bearing exercises encouraged. - alendronate (FOSAMAX) 70 MG tablet; Take 1 tablet (70 mg total) by mouth every 7 (seven) days. Take with a full glass of water on an empty stomach.  Dispense: 12 tablet; Refill: 2   Previous labs reviewed Health Maintenance reviewed Diet and exercise encouraged   Follow Up Instructions: 3 months if office if possible    I discussed the assessment and treatment plan with the patient. The patient was provided an opportunity to ask questions and all were answered. The patient agreed with the plan and demonstrated an understanding of the instructions.   The patient was advised to call back or seek an in-person evaluation if the symptoms worsen or if the condition fails to improve as anticipated.  The above assessment and management plan was discussed with the patient. The patient verbalized understanding of and has agreed to the management plan. Patient is aware to call the clinic if symptoms persist or worsen. Patient is aware when to return to the clinic for a follow-up visit. Patient educated on when it is appropriate to go to the emergency department.    I provided 13 minutes of  non-face-to-face time during this encounter.    Mary-Margaret Hassell Done, FNP

## 2018-06-29 ENCOUNTER — Inpatient Hospital Stay (HOSPITAL_COMMUNITY)
Admission: EM | Admit: 2018-06-29 | Discharge: 2018-07-01 | DRG: 065 | Disposition: A | Discharge status: Rehabilitation Facility | Payer: PPO | Attending: Neurology | Admitting: Neurology

## 2018-06-29 ENCOUNTER — Emergency Department (HOSPITAL_COMMUNITY): Payer: PPO

## 2018-06-29 ENCOUNTER — Encounter (HOSPITAL_COMMUNITY): Payer: Self-pay | Admitting: Emergency Medicine

## 2018-06-29 ENCOUNTER — Inpatient Hospital Stay (HOSPITAL_COMMUNITY): Payer: PPO

## 2018-06-29 ENCOUNTER — Other Ambulatory Visit: Payer: Self-pay

## 2018-06-29 DIAGNOSIS — Z888 Allergy status to other drugs, medicaments and biological substances status: Secondary | ICD-10-CM | POA: Diagnosis not present

## 2018-06-29 DIAGNOSIS — I69254 Hemiplegia and hemiparesis following other nontraumatic intracranial hemorrhage affecting left non-dominant side: Secondary | ICD-10-CM | POA: Diagnosis not present

## 2018-06-29 DIAGNOSIS — H04123 Dry eye syndrome of bilateral lacrimal glands: Secondary | ICD-10-CM | POA: Diagnosis present

## 2018-06-29 DIAGNOSIS — Z7983 Long term (current) use of bisphosphonates: Secondary | ICD-10-CM | POA: Diagnosis not present

## 2018-06-29 DIAGNOSIS — I69398 Other sequelae of cerebral infarction: Secondary | ICD-10-CM | POA: Diagnosis not present

## 2018-06-29 DIAGNOSIS — N189 Chronic kidney disease, unspecified: Secondary | ICD-10-CM | POA: Diagnosis not present

## 2018-06-29 DIAGNOSIS — I69320 Aphasia following cerebral infarction: Secondary | ICD-10-CM | POA: Diagnosis not present

## 2018-06-29 DIAGNOSIS — Z8371 Family history of colonic polyps: Secondary | ICD-10-CM

## 2018-06-29 DIAGNOSIS — Z87891 Personal history of nicotine dependence: Secondary | ICD-10-CM

## 2018-06-29 DIAGNOSIS — I619 Nontraumatic intracerebral hemorrhage, unspecified: Secondary | ICD-10-CM | POA: Diagnosis not present

## 2018-06-29 DIAGNOSIS — R402362 Coma scale, best motor response, obeys commands, at arrival to emergency department: Secondary | ICD-10-CM | POA: Diagnosis present

## 2018-06-29 DIAGNOSIS — I618 Other nontraumatic intracerebral hemorrhage: Principal | ICD-10-CM | POA: Diagnosis present

## 2018-06-29 DIAGNOSIS — Z7982 Long term (current) use of aspirin: Secondary | ICD-10-CM

## 2018-06-29 DIAGNOSIS — E1159 Type 2 diabetes mellitus with other circulatory complications: Secondary | ICD-10-CM | POA: Diagnosis not present

## 2018-06-29 DIAGNOSIS — E7800 Pure hypercholesterolemia, unspecified: Secondary | ICD-10-CM | POA: Diagnosis present

## 2018-06-29 DIAGNOSIS — Z7984 Long term (current) use of oral hypoglycemic drugs: Secondary | ICD-10-CM | POA: Diagnosis not present

## 2018-06-29 DIAGNOSIS — K8681 Exocrine pancreatic insufficiency: Secondary | ICD-10-CM | POA: Diagnosis not present

## 2018-06-29 DIAGNOSIS — Z20828 Contact with and (suspected) exposure to other viral communicable diseases: Secondary | ICD-10-CM | POA: Diagnosis not present

## 2018-06-29 DIAGNOSIS — I610 Nontraumatic intracerebral hemorrhage in hemisphere, subcortical: Secondary | ICD-10-CM

## 2018-06-29 DIAGNOSIS — R29704 NIHSS score 4: Secondary | ICD-10-CM | POA: Diagnosis not present

## 2018-06-29 DIAGNOSIS — E1122 Type 2 diabetes mellitus with diabetic chronic kidney disease: Secondary | ICD-10-CM | POA: Diagnosis not present

## 2018-06-29 DIAGNOSIS — E119 Type 2 diabetes mellitus without complications: Secondary | ICD-10-CM

## 2018-06-29 DIAGNOSIS — Z833 Family history of diabetes mellitus: Secondary | ICD-10-CM | POA: Diagnosis not present

## 2018-06-29 DIAGNOSIS — R29898 Other symptoms and signs involving the musculoskeletal system: Secondary | ICD-10-CM | POA: Diagnosis not present

## 2018-06-29 DIAGNOSIS — Z7902 Long term (current) use of antithrombotics/antiplatelets: Secondary | ICD-10-CM | POA: Diagnosis not present

## 2018-06-29 DIAGNOSIS — I2542 Coronary artery dissection: Secondary | ICD-10-CM | POA: Diagnosis not present

## 2018-06-29 DIAGNOSIS — R402252 Coma scale, best verbal response, oriented, at arrival to emergency department: Secondary | ICD-10-CM | POA: Diagnosis present

## 2018-06-29 DIAGNOSIS — Z86012 Personal history of benign carcinoid tumor: Secondary | ICD-10-CM | POA: Diagnosis not present

## 2018-06-29 DIAGNOSIS — Z86010 Personal history of colonic polyps: Secondary | ICD-10-CM | POA: Diagnosis not present

## 2018-06-29 DIAGNOSIS — R531 Weakness: Secondary | ICD-10-CM | POA: Diagnosis not present

## 2018-06-29 DIAGNOSIS — R269 Unspecified abnormalities of gait and mobility: Secondary | ICD-10-CM | POA: Diagnosis not present

## 2018-06-29 DIAGNOSIS — E1142 Type 2 diabetes mellitus with diabetic polyneuropathy: Secondary | ICD-10-CM | POA: Diagnosis not present

## 2018-06-29 DIAGNOSIS — I7090 Unspecified atherosclerosis: Secondary | ICD-10-CM | POA: Diagnosis not present

## 2018-06-29 DIAGNOSIS — J320 Chronic maxillary sinusitis: Secondary | ICD-10-CM | POA: Diagnosis not present

## 2018-06-29 DIAGNOSIS — N183 Chronic kidney disease, stage 3 unspecified: Secondary | ICD-10-CM | POA: Diagnosis not present

## 2018-06-29 DIAGNOSIS — R0689 Other abnormalities of breathing: Secondary | ICD-10-CM | POA: Diagnosis not present

## 2018-06-29 DIAGNOSIS — Z79899 Other long term (current) drug therapy: Secondary | ICD-10-CM

## 2018-06-29 DIAGNOSIS — E785 Hyperlipidemia, unspecified: Secondary | ICD-10-CM | POA: Diagnosis not present

## 2018-06-29 DIAGNOSIS — E782 Mixed hyperlipidemia: Secondary | ICD-10-CM | POA: Diagnosis not present

## 2018-06-29 DIAGNOSIS — E1151 Type 2 diabetes mellitus with diabetic peripheral angiopathy without gangrene: Secondary | ICD-10-CM | POA: Diagnosis present

## 2018-06-29 DIAGNOSIS — N182 Chronic kidney disease, stage 2 (mild): Secondary | ICD-10-CM | POA: Diagnosis not present

## 2018-06-29 DIAGNOSIS — I671 Cerebral aneurysm, nonruptured: Secondary | ICD-10-CM | POA: Diagnosis not present

## 2018-06-29 DIAGNOSIS — I10 Essential (primary) hypertension: Secondary | ICD-10-CM | POA: Diagnosis not present

## 2018-06-29 DIAGNOSIS — R202 Paresthesia of skin: Secondary | ICD-10-CM | POA: Diagnosis not present

## 2018-06-29 DIAGNOSIS — I611 Nontraumatic intracerebral hemorrhage in hemisphere, cortical: Secondary | ICD-10-CM | POA: Diagnosis not present

## 2018-06-29 DIAGNOSIS — R930 Abnormal findings on diagnostic imaging of skull and head, not elsewhere classified: Secondary | ICD-10-CM | POA: Diagnosis not present

## 2018-06-29 DIAGNOSIS — I161 Hypertensive emergency: Secondary | ICD-10-CM | POA: Diagnosis not present

## 2018-06-29 DIAGNOSIS — R402142 Coma scale, eyes open, spontaneous, at arrival to emergency department: Secondary | ICD-10-CM | POA: Diagnosis not present

## 2018-06-29 DIAGNOSIS — I480 Paroxysmal atrial fibrillation: Secondary | ICD-10-CM | POA: Diagnosis not present

## 2018-06-29 DIAGNOSIS — R918 Other nonspecific abnormal finding of lung field: Secondary | ICD-10-CM | POA: Diagnosis not present

## 2018-06-29 DIAGNOSIS — Z79890 Hormone replacement therapy (postmenopausal): Secondary | ICD-10-CM | POA: Diagnosis not present

## 2018-06-29 DIAGNOSIS — R197 Diarrhea, unspecified: Secondary | ICD-10-CM | POA: Diagnosis not present

## 2018-06-29 DIAGNOSIS — I129 Hypertensive chronic kidney disease with stage 1 through stage 4 chronic kidney disease, or unspecified chronic kidney disease: Secondary | ICD-10-CM | POA: Diagnosis present

## 2018-06-29 DIAGNOSIS — I25110 Atherosclerotic heart disease of native coronary artery with unstable angina pectoris: Secondary | ICD-10-CM | POA: Diagnosis not present

## 2018-06-29 DIAGNOSIS — F0391 Unspecified dementia with behavioral disturbance: Secondary | ICD-10-CM | POA: Diagnosis not present

## 2018-06-29 DIAGNOSIS — F4320 Adjustment disorder, unspecified: Secondary | ICD-10-CM | POA: Diagnosis not present

## 2018-06-29 DIAGNOSIS — I69154 Hemiplegia and hemiparesis following nontraumatic intracerebral hemorrhage affecting left non-dominant side: Secondary | ICD-10-CM | POA: Diagnosis not present

## 2018-06-29 DIAGNOSIS — I6782 Cerebral ischemia: Secondary | ICD-10-CM | POA: Diagnosis not present

## 2018-06-29 DIAGNOSIS — I2111 ST elevation (STEMI) myocardial infarction involving right coronary artery: Secondary | ICD-10-CM | POA: Diagnosis not present

## 2018-06-29 DIAGNOSIS — M1990 Unspecified osteoarthritis, unspecified site: Secondary | ICD-10-CM | POA: Diagnosis not present

## 2018-06-29 DIAGNOSIS — Z955 Presence of coronary angioplasty implant and graft: Secondary | ICD-10-CM | POA: Diagnosis not present

## 2018-06-29 DIAGNOSIS — Z933 Colostomy status: Secondary | ICD-10-CM | POA: Diagnosis not present

## 2018-06-29 DIAGNOSIS — I97190 Other postprocedural cardiac functional disturbances following cardiac surgery: Secondary | ICD-10-CM | POA: Diagnosis not present

## 2018-06-29 DIAGNOSIS — I639 Cerebral infarction, unspecified: Secondary | ICD-10-CM | POA: Insufficient documentation

## 2018-06-29 DIAGNOSIS — R262 Difficulty in walking, not elsewhere classified: Secondary | ICD-10-CM | POA: Diagnosis not present

## 2018-06-29 DIAGNOSIS — J441 Chronic obstructive pulmonary disease with (acute) exacerbation: Secondary | ICD-10-CM | POA: Diagnosis not present

## 2018-06-29 LAB — URINALYSIS, ROUTINE W REFLEX MICROSCOPIC
Bilirubin Urine: NEGATIVE
Bilirubin Urine: NEGATIVE
Glucose, UA: NEGATIVE mg/dL
Glucose, UA: NEGATIVE mg/dL
Hgb urine dipstick: NEGATIVE
Hgb urine dipstick: NEGATIVE
Ketones, ur: NEGATIVE mg/dL
Ketones, ur: NEGATIVE mg/dL
Leukocytes,Ua: NEGATIVE
Leukocytes,Ua: NEGATIVE
Nitrite: NEGATIVE
Nitrite: NEGATIVE
Protein, ur: NEGATIVE mg/dL
Protein, ur: NEGATIVE mg/dL
Specific Gravity, Urine: 1.015 (ref 1.005–1.030)
Specific Gravity, Urine: 1.035 — ABNORMAL HIGH (ref 1.005–1.030)
pH: 7 (ref 5.0–8.0)
pH: 6 (ref 5.0–8.0)

## 2018-06-29 LAB — DIFFERENTIAL
Abs Immature Granulocytes: 0.03 10*3/uL (ref 0.00–0.07)
Basophils Absolute: 0.1 10*3/uL (ref 0.0–0.1)
Basophils Relative: 1 %
Eosinophils Absolute: 0.5 10*3/uL (ref 0.0–0.5)
Eosinophils Relative: 7 %
Immature Granulocytes: 1 %
Lymphocytes Relative: 18 %
Lymphs Abs: 1.1 10*3/uL (ref 0.7–4.0)
Monocytes Absolute: 0.4 10*3/uL (ref 0.1–1.0)
Monocytes Relative: 6 %
Neutro Abs: 4.1 10*3/uL (ref 1.7–7.7)
Neutrophils Relative %: 67 %

## 2018-06-29 LAB — COMPREHENSIVE METABOLIC PANEL
ALT: 17 U/L (ref 0–44)
ALT: 17 U/L (ref 0–44)
AST: 19 U/L (ref 15–41)
AST: 18 U/L (ref 15–41)
Albumin: 4.6 g/dL (ref 3.5–5.0)
Albumin: 4.3 g/dL (ref 3.5–5.0)
Alkaline Phosphatase: 65 U/L (ref 38–126)
Alkaline Phosphatase: 59 U/L (ref 38–126)
Anion gap: 9 (ref 5–15)
Anion gap: 13 (ref 5–15)
BUN: 36 mg/dL — ABNORMAL HIGH (ref 8–23)
BUN: 31 mg/dL — ABNORMAL HIGH (ref 8–23)
CO2: 22 mmol/L (ref 22–32)
CO2: 19 mmol/L — ABNORMAL LOW (ref 22–32)
Calcium: 9.4 mg/dL (ref 8.9–10.3)
Calcium: 9.5 mg/dL (ref 8.9–10.3)
Chloride: 105 mmol/L (ref 98–111)
Chloride: 105 mmol/L (ref 98–111)
Creatinine, Ser: 1.55 mg/dL — ABNORMAL HIGH (ref 0.61–1.24)
Creatinine, Ser: 1.47 mg/dL — ABNORMAL HIGH (ref 0.61–1.24)
GFR calc Af Amer: 51 mL/min — ABNORMAL LOW (ref >60)
GFR calc Af Amer: 54 mL/min — ABNORMAL LOW (ref >60)
GFR calc non Af Amer: 44 mL/min — ABNORMAL LOW (ref >60)
GFR calc non Af Amer: 47 mL/min — ABNORMAL LOW (ref >60)
Glucose, Bld: 158 mg/dL — ABNORMAL HIGH (ref 70–99)
Glucose, Bld: 182 mg/dL — ABNORMAL HIGH (ref 70–99)
Potassium: 4.5 mmol/L (ref 3.5–5.1)
Potassium: 3.9 mmol/L (ref 3.5–5.1)
Sodium: 136 mmol/L (ref 135–145)
Sodium: 137 mmol/L (ref 135–145)
Total Bilirubin: 0.7 mg/dL (ref 0.3–1.2)
Total Bilirubin: 0.9 mg/dL (ref 0.3–1.2)
Total Protein: 8.1 g/dL (ref 6.5–8.1)
Total Protein: 7.5 g/dL (ref 6.5–8.1)

## 2018-06-29 LAB — CBC
HCT: 35.5 % — ABNORMAL LOW (ref 39.0–52.0)
HCT: 33.8 % — ABNORMAL LOW (ref 39.0–52.0)
Hemoglobin: 12.1 g/dL — ABNORMAL LOW (ref 13.0–17.0)
Hemoglobin: 11.9 g/dL — ABNORMAL LOW (ref 13.0–17.0)
MCH: 31.8 pg (ref 26.0–34.0)
MCH: 31.7 pg (ref 26.0–34.0)
MCHC: 34.1 g/dL (ref 30.0–36.0)
MCHC: 35.2 g/dL (ref 30.0–36.0)
MCV: 93.2 fL (ref 80.0–100.0)
MCV: 90.1 fL (ref 80.0–100.0)
Platelets: 258 10*3/uL (ref 150–400)
Platelets: 259 10*3/uL (ref 150–400)
RBC: 3.81 MIL/uL — ABNORMAL LOW (ref 4.22–5.81)
RBC: 3.75 MIL/uL — ABNORMAL LOW (ref 4.22–5.81)
RDW: 13 % (ref 11.5–15.5)
RDW: 12.8 % (ref 11.5–15.5)
WBC: 6.2 10*3/uL (ref 4.0–10.5)
WBC: 10.2 10*3/uL (ref 4.0–10.5)
nRBC: 0 % (ref 0.0–0.2)
nRBC: 0 % (ref 0.0–0.2)

## 2018-06-29 LAB — MRSA PCR SCREENING: MRSA by PCR: NEGATIVE

## 2018-06-29 LAB — GLUCOSE, CAPILLARY
Glucose-Capillary: 159 mg/dL — ABNORMAL HIGH (ref 70–99)
Glucose-Capillary: 172 mg/dL — ABNORMAL HIGH (ref 70–99)

## 2018-06-29 LAB — PROTIME-INR
INR: 1 (ref 0.8–1.2)
INR: 1.1 (ref 0.8–1.2)
Prothrombin Time: 13.2 seconds (ref 11.4–15.2)
Prothrombin Time: 14.3 seconds (ref 11.4–15.2)

## 2018-06-29 LAB — LIPID PANEL
Cholesterol: 163 mg/dL (ref 0–200)
HDL: 31 mg/dL — ABNORMAL LOW (ref >40)
LDL Cholesterol: 97 mg/dL (ref 0–99)
Total CHOL/HDL Ratio: 5.3 RATIO
Triglycerides: 177 mg/dL — ABNORMAL HIGH (ref <150)
VLDL: 35 mg/dL (ref 0–40)

## 2018-06-29 LAB — RAPID URINE DRUG SCREEN, HOSP PERFORMED
Amphetamines: NOT DETECTED
Barbiturates: NOT DETECTED
Benzodiazepines: NOT DETECTED
Cocaine: NOT DETECTED
Opiates: NOT DETECTED
Tetrahydrocannabinol: NOT DETECTED

## 2018-06-29 LAB — HEMOGLOBIN A1C
Hgb A1c MFr Bld: 6.8 % — ABNORMAL HIGH (ref 4.8–5.6)
Mean Plasma Glucose: 148.46 mg/dL

## 2018-06-29 LAB — APTT
aPTT: 29 seconds (ref 24–36)
aPTT: 30 seconds (ref 24–36)

## 2018-06-29 LAB — ETHANOL: Alcohol, Ethyl (B): <10 mg/dL (ref <10)

## 2018-06-29 MED ORDER — AMLODIPINE BESYLATE 10 MG PO TABS
10.0000 mg | ORAL_TABLET | Freq: Every day | ORAL | Status: DC
  Administered 2018-06-29 – 2018-06-30 (×2): 10 mg via ORAL
  Filled 2018-06-29 (×2): qty 1

## 2018-06-29 MED ORDER — ACETAMINOPHEN 325 MG PO TABS
650.0000 mg | ORAL_TABLET | ORAL | Status: DC | PRN
  Administered 2018-07-01: 650 mg via ORAL
  Filled 2018-06-29: qty 2

## 2018-06-29 MED ORDER — VITAMIN E 180 MG (400 UNIT) PO CAPS
400.0000 [IU] | ORAL_CAPSULE | Freq: Every day | ORAL | Status: DC
  Administered 2018-06-29 – 2018-06-30 (×2): 400 [IU] via ORAL
  Filled 2018-06-29 (×3): qty 1

## 2018-06-29 MED ORDER — LABETALOL HCL 5 MG/ML IV SOLN: 20.0000 mg | Freq: Once | INTRAVENOUS | Status: DC

## 2018-06-29 MED ORDER — LOSARTAN POTASSIUM 50 MG PO TABS
50.0000 mg | ORAL_TABLET | Freq: Every day | ORAL | Status: DC
  Administered 2018-06-29 – 2018-07-01 (×3): 50 mg via ORAL
  Filled 2018-06-29 (×3): qty 1

## 2018-06-29 MED ORDER — CALCIUM CARBONATE ANTACID 500 MG PO CHEW
1.0000 | CHEWABLE_TABLET | Freq: Every day | ORAL | Status: DC | PRN
  Administered 2018-06-29: 200 mg via ORAL
  Filled 2018-06-29: qty 1

## 2018-06-29 MED ORDER — IOHEXOL 350 MG/ML SOLN
75.0000 mL | Freq: Once | INTRAVENOUS | Status: AC | PRN
  Administered 2018-06-29: 75 mL via INTRAVENOUS

## 2018-06-29 MED ORDER — SENNOSIDES-DOCUSATE SODIUM 8.6-50 MG PO TABS
1.0000 | ORAL_TABLET | Freq: Two times a day (BID) | ORAL | Status: DC
  Administered 2018-06-29 – 2018-07-01 (×4): 1 via ORAL
  Filled 2018-06-29 (×4): qty 1

## 2018-06-29 MED ORDER — PANTOPRAZOLE SODIUM 40 MG IV SOLR
40.0000 mg | Freq: Every day | INTRAVENOUS | Status: DC
  Administered 2018-06-29: 40 mg via INTRAVENOUS
  Filled 2018-06-29: qty 40

## 2018-06-29 MED ORDER — CLONIDINE HCL 0.1 MG PO TABS
0.1000 mg | ORAL_TABLET | Freq: Every day | ORAL | Status: DC
  Administered 2018-06-29 – 2018-07-01 (×3): 0.1 mg via ORAL
  Filled 2018-06-29 (×3): qty 1

## 2018-06-29 MED ORDER — INSULIN ASPART 100 UNIT/ML ~~LOC~~ SOLN
0.0000 [IU] | Freq: Three times a day (TID) | SUBCUTANEOUS | Status: DC
  Administered 2018-06-29 – 2018-07-01 (×5): 3 [IU] via SUBCUTANEOUS
  Administered 2018-07-01: 5 [IU] via SUBCUTANEOUS

## 2018-06-29 MED ORDER — ACETAMINOPHEN 650 MG RE SUPP: 650.0000 mg | RECTAL | Status: DC | PRN

## 2018-06-29 MED ORDER — HYDRALAZINE HCL 20 MG/ML IJ SOLN
5.0000 mg | Freq: Once | INTRAMUSCULAR | Status: AC
  Administered 2018-06-29: 5 mg via INTRAVENOUS
  Filled 2018-06-29: qty 1

## 2018-06-29 MED ORDER — CHLORTHALIDONE 25 MG PO TABS
25.0000 mg | ORAL_TABLET | Freq: Every day | ORAL | Status: DC
  Administered 2018-06-29 – 2018-07-01 (×3): 25 mg via ORAL
  Filled 2018-06-29 (×3): qty 1

## 2018-06-29 MED ORDER — NICARDIPINE HCL IN NACL 20-0.86 MG/200ML-% IV SOLN
3.0000 mg/h | INTRAVENOUS | Status: DC
  Administered 2018-06-29 (×2): 5 mg/h via INTRAVENOUS
  Administered 2018-06-29 (×3): 7.5 mg/h via INTRAVENOUS
  Filled 2018-06-29 (×5): qty 200

## 2018-06-29 MED ORDER — ACETAMINOPHEN 160 MG/5ML PO SOLN: 650.0000 mg | ORAL | Status: DC | PRN

## 2018-06-29 MED ORDER — STROKE: EARLY STAGES OF RECOVERY BOOK
Freq: Once | Status: AC
  Administered 2018-06-29: 18:00:00

## 2018-06-29 MED ORDER — ATORVASTATIN CALCIUM 10 MG PO TABS
10.0000 mg | ORAL_TABLET | Freq: Every day | ORAL | Status: DC
  Administered 2018-06-29 – 2018-06-30 (×2): 10 mg via ORAL
  Filled 2018-06-29 (×2): qty 1

## 2018-06-29 MED ORDER — CLEVIDIPINE BUTYRATE 0.5 MG/ML IV EMUL: 0.0000 mg/h | INTRAVENOUS | Status: DC

## 2018-06-29 NOTE — Progress Notes (Signed)
Spoke to RN 06/29/2018 @8pm  she stated pt wanted to rest and did not want to go for MRI at that time. We will check back in the am.

## 2018-06-29 NOTE — Consult Note (Signed)
[facility] TeleNeurology Consult Services   Date of Service:   06/29/2018 09:35:03  Impression:     .  R BG ICH  Comments/Sign-Out: 73yo M w pmh of htn and t2dm p/w L sided weakness. R BG ICH likely due to uncontrolled HTN.  Mechanism of Stroke:  likely hypertensive  Metrics: Last Known Well: 06/29/2018 08:30:00 [facility] Notification Time: 06/29/2018 09:35:03 Arrival Time: 06/29/2018 09:22:00 Stamp Time: 06/29/2018 09:35:03 Time First Login Attempt: 06/29/2018 09:38:54 Video Start Time: 06/29/2018 09:38:54  Symptoms: L sided weakness NIHSS Start Assessment Time: 06/29/2018 09:38:54 Patient is not a candidate for tPA. Patient was not deemed candidate for tPA thrombolytics because of Current or Previous ICH. Video End Time: 06/29/2018 09:54:09  CT head was reviewed and results were: R BG ICH  ED Physician notified of diagnostic impression and management plan on 06/29/2018 09:57:39  Our recommendations are outlined below.  Recommendations:     .  Activate Stroke Protocol Admission/Order Set     .  Stroke/Telemetry Floor     .  Neuro Checks     .  Bedside Swallow Eval     .  DVT Prophylaxis     .  IV Fluids, Normal Saline     .  Head of Bed Below 30 Degrees     .  Euglycemia and Avoid Hyperthermia (PRN Acetaminophen)     .  Hold Antithrombotics for Now     .  Strict bp control SBP < 140     .  NSx consult (likey no intervention due to location and likely mechanism)  Routine Consultation with [facility] for Follow up Care  Sign Out:     .  Discussed with Emergency Department Provider    ------------------------------------------------------------------------------  History of Present Illness: Patient is a 73 year old Male.  Patient was brought by EMS for symptoms of L sided weakness  73yo M w pmh of htn and t2dm p/w L sided weakness. On asa at baseline.  CT head was reviewed.  There is history of hemorrhagic  complications or intracranial hemorrhage. There is no history of Recent Anticoagulants.  Examination: BP(170/82), 1A: Level of Consciousness - Alert; keenly responsive + 0 1B: Ask Month and Age - Both Questions Right + 0 1C: Blink Eyes & Squeeze Hands - Performs Both Tasks + 0 2: Test Horizontal Extraocular Movements - Normal + 0 3: Test Visual Fields - No Visual Loss + 0 4: Test Facial Palsy (Use Grimace if Obtunded) - Normal symmetry + 0 5A: Test Left Arm Motor Drift - Drift, but doesn't hit bed + 1 5B: Test Right Arm Motor Drift - No Drift for 10 Seconds + 0 6A: Test Left Leg Motor Drift - Drift, but doesn't hit bed + 1 6B: Test Right Leg Motor Drift - No Drift for 5 Seconds + 0 7: Test Limb Ataxia (FNF/Heel-Shin) - Ataxia in 1 Limb + 1 8: Test Sensation - Mild-Moderate Loss: Less Sharp/More Dull + 1 9: Test Language/Aphasia - Normal; No aphasia + 0 10: Test Dysarthria - Normal + 0 11: Test Extinction/Inattention - No abnormality + 0  NIHSS Score: 4  Patient was informed the Neurology Consult would happen via TeleHealth consult by way of interactive audio and video telecommunications and consented to receiving care in this manner.  Due to the immediate potential for life-threatening deterioration due to underlying acute neurologic illness, I spent 35 minutes providing critical care. This time includes time for face to face visit via telemedicine, review  of medical records, imaging studies and discussion of findings with providers, the patient and/or family.   Dr Barron Schmid   [facility] [phone]   Case [id number]

## 2018-06-29 NOTE — ED Provider Notes (Signed)
Emergency Department Provider Note   I have reviewed the triage vital signs and the nursing notes.   HISTORY  Chief Complaint Code Stroke   HPI Peter York is a 74 y.o. male with PMH of HTN, HLD, and DM presents to the emergency department by EMS with left leg weakness and tingling sensation in the left arm.  Last seen normal at 8:25 AM.  Patient states he woke up and was feeling well with no perceived deficits.  He walked to the restroom without difficulty.  He states he stood up from the commode and was unable to move his left leg like normal.  He began having tingling sensation in the left hand and arm.  Patient states that he almost fell because of this deficit.  Denies any sudden severe headache, chest pain, difficulty swallowing, or vision change.  EMS activated code stroke in route. No radiation of symptoms or modifying factors.   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 Cornett, MD;  Location: [facility];  Service: General;  Laterality: N/A;  excision perianal mass    APPENDECTOMY     COLONOSCOPY W/ POLYPECTOMY  12/31/2005   Dr. Silvano Rusk   UMBILICAL HERNIA REPAIR      Allergies Benazepril and Crestor [rosuvastatin calcium]  Family History  Problem Relation Age of Onset   Colon cancer  Father 3   Colon polyps Father    Cancer Father 27       Colon   Heart attack Maternal Uncle        X 4   Heart disease Maternal Uncle    Heart disease Maternal Uncle    Heart disease Maternal Uncle    Diabetes Daughter 79       Type 1   Transient ischemic attack Brother 51    Social History Social History   Tobacco Use   Smoking status: Former Smoker    Packs/day: 0.75    Years: 15.00    Pack years: 11.25    Types: Cigarettes    Last attempt to quit: 03/26/1978    Years since quitting: 40.2   Smokeless tobacco: Never Used  Substance Use Topics   Alcohol use: Yes    Comment: socially   Drug use: No    Review of Systems  Constitutional: No fever/chills Eyes: No visual changes. ENT: No sore throat. Cardiovascular: Denies chest pain. Respiratory: Denies shortness of breath. Gastrointestinal: No abdominal pain.  No nausea, no vomiting.  No diarrhea.  No constipation. Genitourinary: Negative for dysuria. Musculoskeletal: Negative for back pain. Skin: Negative for rash. Neurological: Negative for headaches. Positive left leg weakness and left hand numbness.   10-point ROS otherwise negative.  ____________________________________________   PHYSICAL EXAM:  VITAL SIGNS: Vitals:   06/29/18 0938 06/29/18 1012  BP:  (!) 144/88  Pulse: 65 62  Resp: 18  16  SpO2: 100% 98%    Constitutional: Alert and oriented. Well appearing and in no acute distress. Eyes: Conjunctivae are normal. PERRL. EOMI.  Head: Atraumatic. Nose: No congestion/rhinnorhea. Mouth/Throat: Mucous membranes are moist.  Neck: No stridor.   Cardiovascular: Normal rate, regular rhythm. Good peripheral circulation. Grossly normal heart sounds.   Respiratory: Normal respiratory effort.  No retractions. Lungs CTAB. Gastrointestinal: Soft and nontender. No distention.  Musculoskeletal: No gross deformities of extremities. Neurologic:  Normal speech and language. CN exam 2-12 WNL. Patient  with left arm drift but equal grips. Subjective numbness over the left hand but not extending to the forearm or shoulder. 5/5 strength in the hip flexors and ankles. Slight left leg drift but minimal.  Skin:  Skin is warm, dry and intact. No rash noted.  ____________________________________________   LABS (all labs ordered are listed, but only abnormal results are displayed)  Labs Reviewed  CBC - Abnormal; Notable for the following components:      Result Value   RBC 3.81 (*)    Hemoglobin 12.1 (*)    HCT 35.5 (*)    All other components within normal limits  PROTIME-INR  APTT  DIFFERENTIAL  ETHANOL  COMPREHENSIVE METABOLIC PANEL  RAPID URINE DRUG SCREEN, HOSP PERFORMED  URINALYSIS, ROUTINE W REFLEX MICROSCOPIC   ____________________________________________  EKG   EKG Interpretation  Date/Time:  Sunday June 29 2018 09:37:58 EDT Ventricular Rate:  64 PR Interval:    QRS Duration: 90 QT Interval:  407 QTC Calculation: 420 R Axis:   28 Text Interpretation:  Sinus rhythm Abnormal R-wave progression, early transition No STEMI. Similar to 2017 tracing.  Confirmed by Nanda Quinton [phone]) on 06/29/2018 9:54:41 AM       ____________________________________________  RADIOLOGY  Ct Head Code Stroke Wo Contrast  Result Date: 06/29/2018 CLINICAL DATA:  Code stroke.  Left arm and leg weakness. EXAM: CT HEAD WITHOUT CONTRAST TECHNIQUE: Contiguous axial images were obtained from the base of the skull through the vertex without intravenous contrast. COMPARISON:  None. FINDINGS: Brain: 15 mm high-density hematoma in the posterior right putamen and extending into the posterior limb internal capsule. No superimposed acute infarct is noted. Mild small vessel ischemic type change in the cerebral white matter. Remote lacunar infarct in the right caudate body. No hydrocephalus, collection, or masslike finding Vascular: Atherosclerotic calcification.  No hyperdense vessel Skull: Negative  Sinuses/Orbits: Opacified left maxillary sinus with findings of mixed density secretions surrounded by low-density mucosal thickening. Other: Critical Value/emergent results were called by telephone at the time of interpretation on 06/29/2018 at 9:38 am to Dr. Nanda Quinton , who verbally acknowledged these results. ASPECTS [facility] Stroke Program Early CT Score) not scored in the setting of hemorrhage IMPRESSION: 1. 15 mm acute hematoma in the right basal ganglia/internal capsule. 2. Chronic small vessel disease. 3. Left maxillary sinusitis. Electronically Signed   By: Monte Fantasia M.D.   On: 06/29/2018 09:41    ____________________________________________   PROCEDURES  Procedure(s) performed:   .Critical Care Performed by: Margette Fast, MD Authorized by: Margette Fast, MD   Critical care provider statement:    Critical care time (minutes):  35   Critical care time was exclusive of:  Separately billable procedures and treating other patients   Critical care was necessary to treat or prevent imminent or life-threatening deterioration of the following conditions:  CNS failure or compromise   Critical care was time spent personally by me on the following activities:  Blood draw for specimens,  development of treatment plan with patient or surrogate, discussions with consultants, evaluation of patient's response to treatment, examination of patient, ordering and performing treatments and interventions, ordering and review of laboratory studies, ordering and review of radiographic studies, pulse oximetry, re-evaluation of patient's condition and review of old charts   I assumed direction of critical care for this patient from another provider in my specialty: no       ____________________________________________   INITIAL IMPRESSION / ASSESSMENT AND PLAN / ED COURSE  Pertinent labs & imaging results that were available during my care of the patient were reviewed by me and considered in my  medical decision making (see chart for details).   Patient arrives to the emergency department by EMS as a code stroke.  Last seen normal 8:25 AM.  Patient is not anticoagulated.  No prior history of stroke.  Patient with mild weakness of the hip flexors and sensory deficit in the left hand/arm.  Patient taken directly to CT and will be evaluated by tele-neurology on his return. Patient with fluent speech and no concern for immediate airway compromise.   CT appears to have a right basal ganglion hemorrhage by my read. Updated Tele-neuro nurse on screen.   09:42 AM  Called by radiology to discuss CT findings.  Patient with right basal ganglia hemorrhage which correlates with the patient's deficits.  Ordered hydralazine as the patient does have some mild bradycardia (60s) followed by nicardipine infusion with SBP goals in place. Tele-neruro evaluation pending.   No severe anemia. Platelets are normal.   10:15 AM  Discussed patient's case with Neurology, Dr. Cheral Marker to request admission. Patient and family (if present) updated with plan. Care transferred to Neurology service.  I reviewed all nursing notes, vitals, pertinent old records, EKGs, labs, imaging (as available).  ____________________________________________  FINAL CLINICAL IMPRESSION(S) / ED DIAGNOSES  Final diagnoses:  Hemorrhagic stroke ([place])  Left-sided weakness    MEDICATIONS GIVEN DURING THIS VISIT:  Medications  nicardipine (CARDENE) 20mg  in 0.86% saline 246ml IV infusion (0.1 mg/ml) (5 mg/hr Intravenous New Bag/Given 06/29/18 1011)  hydrALAZINE (APRESOLINE) injection 5 mg (5 mg Intravenous Given 06/29/18 1013)    Note:  This document was prepared using Dragon voice recognition software and may include unintentional dictation errors.  Nanda Quinton, MD Emergency Medicine    Shannell Mikkelsen, [facility] Old[facility], MD 06/29/18 1027

## 2018-06-29 NOTE — ED Triage Notes (Signed)
Per EMS: patient called due to left sided weakness. EMS states patient had equal strength in all extremities at 0910. The state noticeable left foot drop while trying to ambulate. In triage patient Alert x 4 with mild drift to left arm and leg. Last known well 0830.

## 2018-06-29 NOTE — H&P (Signed)
H&P  CC: Left sided weakness and left hand numbness  History is obtained from:Patient and chart review   HPI: Peter York is a 73 y.o. male remote Smoker, HTN, on ASA, is admitted at [facility] in transfer from the [facility] ED with a basal ganglia ICH. He states that he experienced acute L arm and leg weakness, and as a result he lost his balance, prompting neurological workup . CT Brain revealed a 15 mm acute hematoma in the right basal ganglia/internal capsule. Chronic small vessel disease.and left maxillary sinusitis were also noted.  He admits to HA 3/10, left hand numbness, left side weakness. He denies CP, SOB, heart palpitations, or N/V. He denies uncontrolled blood pressure as he checks it daily and states that he takes his medications as prescribed.   ROS: A 14 point ROS was performed and is negative except as noted in the HPI.  Past Medical History:  Diagnosis Date  . Adenomatous colon polyp 12/31/2005  . Diabetes mellitus   . Hypercholesterolemia   . Hypertension      Family History  Problem Relation Age of Onset  . Colon cancer Father 31  . Colon polyps Father   . Cancer Father 63       Colon  . Heart attack Maternal Uncle        X 4  . Heart disease Maternal Uncle   . Heart disease Maternal Uncle   . Heart disease Maternal Uncle   . Diabetes Daughter 13       Type 1  . Transient ischemic attack Brother 55    Social History:   reports that he quit smoking about 40 years ago. His smoking use included cigarettes. He has a 11.25 pack-year smoking history. He has never used smokeless tobacco. He reports current alcohol use. He reports that he does not use drugs.  Medications  Current Facility-Administered Medications:  .   stroke: mapping our early stages of recovery book, , Does not apply, Once, Kerney Elbe, MD .  acetaminophen (TYLENOL) tablet 650 mg, 650 mg, Oral, Q4H PRN **OR** acetaminophen (TYLENOL) solution 650 mg, 650 mg, Per Tube, Q4H PRN **OR**  acetaminophen (TYLENOL) suppository 650 mg, 650 mg, Rectal, Q4H PRN, Kerney Elbe, MD .  nicardipine (CARDENE) 20mg  in 0.86% saline 296ml IV infusion (0.1 mg/ml), 3-15 mg/hr, Intravenous, Continuous, Long, Wonda Old[facility], MD, Last Rate: 75 mL/hr at 06/29/18 1410, 7.5 mg/hr at 06/29/18 1410 .  pantoprazole (PROTONIX) injection 40 mg, 40 mg, Intravenous, QHS, Kerney Elbe, MD .  senna-docusate (Senokot-S) tablet 1 tablet, 1 tablet, Oral, BID, Kerney Elbe, MD   Exam: Current vital signs: BP 137/66   Pulse 83   Resp 17   Ht 5\' 8"  (1.727 m)   Wt 72 kg   SpO2 97%   BMI 24.14 kg/m  Vital signs in last 24 hours: Pulse Rate:  [62-83] 83 (04/05 1500) Resp:  [11-18] 17 (04/05 1500) BP: (136-170)/(61-88) 137/66 (04/05 1500) SpO2:  [97 %-100 %] 97 % (04/05 1500) Weight:  [72 kg-74.4 kg] 72 kg (04/05 1305)  GENERAL: Awake, alert in NAD HEENT: - Normocephalic and atraumatic, dry mm, no LN++, no Thyromegally LUNGS - Clear to auscultation bilaterally with no wheezes CV - S1S2 RRR, no m/r/g, equal pulses bilaterally. ABDOMEN - Soft, nontender, nondistended with normoactive BS Ext: warm, well perfused, intact peripheral pulses, edema  NEURO:  Mental Status: AA&Ox3. Speech is clear.  Naming, repetition, fluency, and comprehension intact. Cranial Nerves: PERRL mm/brisk. EOMI, visual fields  full, no facial asymmetry, facial sensation intact, hearing intact, tongue deviates to L, Palate elevates midline, normal sternocleidomastoid and trapezius muscle strength. No evidence of tongue atrophy  Motor:  LLE: 4+/5  LUE: 4+/5 deltoid and triceps otherwise 5/5  RLE and RUE: 5/5 Tone is normal and bulk is normal Sensation- Intact to light touch bilaterally. Temp sensation decreased to LUE. No extinction.  DTR: 2+- all reflexes upper and lower extremities. Coordination: FTN with dysmetria on the left Gait- deferred   Labs I have reviewed labs in epic and the results pertinent to this consultation  are: Cr. 1.47, BUN 31  CBC    Component Value Date/Time   WBC 10.2 06/29/2018 1401   RBC 3.75 (L) 06/29/2018 1401   HGB 11.9 (L) 06/29/2018 1401   HGB WILL FOLLOW 03/17/2018 1512   HCT 33.8 (L) 06/29/2018 1401   HCT WILL FOLLOW 03/17/2018 1512   PLT 259 06/29/2018 1401   PLT WILL FOLLOW 03/17/2018 1512   MCV 90.1 06/29/2018 1401   MCV WILL FOLLOW 03/17/2018 1512   MCH 31.7 06/29/2018 1401   MCHC 35.2 06/29/2018 1401   RDW 12.8 06/29/2018 1401   RDW WILL FOLLOW 03/17/2018 1512   LYMPHSABS 1.1 06/29/2018 0925   LYMPHSABS WILL FOLLOW 03/17/2018 1512   MONOABS 0.4 06/29/2018 0925   EOSABS 0.5 06/29/2018 0925   EOSABS WILL FOLLOW 03/17/2018 1512   BASOSABS 0.1 06/29/2018 0925   BASOSABS WILL FOLLOW 03/17/2018 1512    CMP     Component Value Date/Time   NA 137 06/29/2018 1401   NA 138 03/17/2018 1512   K 3.9 06/29/2018 1401   CL 105 06/29/2018 1401   CO2 19 (L) 06/29/2018 1401   GLUCOSE 182 (H) 06/29/2018 1401   BUN 31 (H) 06/29/2018 1401   BUN 28 (H) 03/17/2018 1512   CREATININE 1.47 (H) 06/29/2018 1401   CREATININE 1.28 (H) 05/16/2015 0918   CALCIUM 9.5 06/29/2018 1401   PROT 7.5 06/29/2018 1401   PROT 6.8 03/17/2018 1512   ALBUMIN 4.3 06/29/2018 1401   ALBUMIN 4.3 03/17/2018 1512   AST 18 06/29/2018 1401   ALT 17 06/29/2018 1401   ALKPHOS 59 06/29/2018 1401   BILITOT 0.9 06/29/2018 1401   BILITOT 0.3 03/17/2018 1512   GFRNONAA 47 (L) 06/29/2018 1401   GFRNONAA 54 (L) 10/02/2012 0834   GFRAA 54 (L) 06/29/2018 1401   GFRAA 63 10/02/2012 0834    Lipid Panel     Component Value Date/Time   CHOL 163 06/29/2018 1401   CHOL 170 03/17/2018 1512   CHOL 165 10/02/2012 0834   TRIG 177 (H) 06/29/2018 1401   TRIG 115 03/09/2014 0818   TRIG 95 10/02/2012 0834   HDL 31 (L) 06/29/2018 1401   HDL 32 (L) 03/17/2018 1512   HDL 43 03/09/2014 0818   HDL 51 10/02/2012 0834   CHOLHDL 5.3 06/29/2018 1401   VLDL 35 06/29/2018 1401   LDLCALC 97 06/29/2018 1401   LDLCALC  98 03/17/2018 1512   LDLCALC 223 (H) 09/10/2013 0905   LDLCALC 95 10/02/2012 0834     Imaging I have reviewed the images obtained:  CT-scan of the brain 1. 15 mm acute hematoma in the right basal ganglia/internal capsule. 2. Chronic small vessel disease. 3. Left maxillary sinusitis.  CTA head and neck  No large or medium vessel occlusion. No flow limiting proximal stenosis. Diffuse atherosclerotic change of the more distal intracranial branch vessels. 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 This would not relate  to the right basal ganglia intraparenchymal hemorrhage.  MRI examination of the brain- pending   Assessment: 73 year old male with 15 mm acute hematoma in the right basal ganglia/internal capsule. 1. Exam reveals mild left sided deficits.  2. Etiology: Suspicion of hypertensive urgency with or without underlying amyloid angiopathy or chronic hypertensive small vessel disease.  Recommendations:Recommend # MRI of the brain without contrast # Repeat CT of head in 24 hours  # Stop ASA with current BG ICH- higher risk of bleeding  # SBP goal < 140 # Telemetry monitoring # Frequent neuro checks # NPO until passes stroke swallow screen- if passed then initiate diet  # please page stroke NP  Or  PA  Or MD from 8am -4 pm  as this patient from this time will be  followed by the stroke.   You can look them up on [email or web address]  Password [email or web address]  Lilyan Gilford NP @[email or web address] @  I have interviewed and examined the patient. My exam findings were observed and documented by the Neurohospitalist NP. I have formulated the assessment and plan.  Electronically signed: Dr. Kerney Elbe

## 2018-06-29 NOTE — Progress Notes (Signed)
CODE STROKE [id number] Beeper time [phone] exam started 0927 exam finished , images sent to [place] exam completed in Epic, called GR spoke with ?Richardson Landry

## 2018-06-29 NOTE — Evaluation (Signed)
Speech Language Pathology Evaluation Patient Details Name: Peter York MRN: [id number] DOB: April 08, 1945 Today's Date: 06/29/2018 Time: 8563-1497 SLP Time Calculation (min) (ACUTE ONLY): 28 min  Problem List:  Patient Active Problem List   Diagnosis Date Noted  . Hemorrhagic stroke ([place]) 06/29/2018  . Stroke (cerebrum) ([place]) 06/29/2018  . 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 Compression fracture of thoracic vertebra ([place]) 04/11/2016  . Family history of coronary arteriosclerosis 04/07/2015  . Type 2 diabetes mellitus without complication ([place]) 02/63/7858  . Mixed hyperlipidemia 10/06/2012  . Hypertension 10/06/2012  . History of colonic polyps 07/19/2011   Past Medical History:  Past Medical History:  Diagnosis Date  . Adenomatous colon polyp 12/31/2005  . Diabetes mellitus   . Hypercholesterolemia   . Hypertension    Past Surgical History:  Past Surgical History:  Procedure Laterality Date  . ANAL FISTULECTOMY  10/02/2011   Procedure: FISTULECTOMY ANAL;  Surgeon: Joyice Faster. Cornett, MD;  Location: [facility];  Service: General;  Laterality: N/A;  excision perianal mass   . APPENDECTOMY    . COLONOSCOPY W/ POLYPECTOMY  12/31/2005   Dr. Silvano Rusk  . UMBILICAL HERNIA REPAIR     HPI:  Pt is a 73 y.o. male with PMH of HTN, HLD, and DM presented to the emergency department by EMS with left leg weakness and tingling sensation in the left arm. CT of the head revealed 15 mm acute hematoma in the right basal ganglia/internal capsule.   Assessment / Plan / Recommendation Clinical Impression  Pt participated in speech/language/cognition evaluation and he denied any new deficits in these areas but stated that he has noticed some changes in his memory with age. The [facility] Cognitive Assessment 8.1 was completed to evaluate the pt's cognitive-linguistic skills. He achieved a score of 30/30 which is within the normal limits of 26 or more out of 30 and no speech/language  deficits were demonstrated. It is noteworthy that the pt was able to achieve this score despite the session being interrupted by a phone call from pharmacy for the pt to provide and verify all of his home medications. Further skilled SLP services are not clinically indicated at this time. Pt and nursing were educated regarding this and both parties verbalized understanding as well as agreement with plan of care.    SLP Assessment  SLP Recommendation/Assessment: Patient does not need any further Speech Lanaguage Pathology Services SLP Visit Diagnosis: Cognitive communication deficit (R41.841)    Follow Up Recommendations  None    Frequency and Duration           SLP Evaluation Cognition  Overall Cognitive Status: Within Functional Limits for tasks assessed Arousal/Alertness: Awake/alert Orientation Level: Oriented X4 Attention: Focused;Sustained Focused Attention: Appears intact(Vigilance WNL: 1/1) Sustained Attention: Appears intact(Serial 7s: 3/3) Memory: Appears intact Awareness: Appears intact(Immediate: 5/5; delayed: 5/5) Problem Solving: Appears intact Executive Function: Reasoning;Sequencing Reasoning: Appears intact(Abstraction: 2/2) Sequencing: Appears intact(Clock drawing: 3/3)       Comprehension  Auditory Comprehension Overall Auditory Comprehension: Appears within functional limits for tasks assessed Yes/No Questions: Within Functional Limits Commands: Within Functional Limits(Complex commands- trail completion: 1/1) Conversation: Complex Reading Comprehension Reading Status: Not tested    Expression Expression Primary Mode of Expression: Verbal Verbal Expression Overall Verbal Expression: Appears within functional limits for tasks assessed Initiation: No impairment Level of Generative/Spontaneous Verbalization: Conversation;Sentence Repetition: No impairment(Sentence repetition: 2/2) Naming: No impairment(Confrontational: 3/3; Divergent: 1/1) Pragmatics: No  impairment Written Expression Dominant Hand: Right Written Expression: (Copying cube: 1/1)  Oral / Motor  Oral Motor/Sensory Function Overall Oral Motor/Sensory Function: Within functional limits Motor Speech Overall Motor Speech: Appears within functional limits for tasks assessed Respiration: Within functional limits Phonation: Normal Resonance: Within functional limits Articulation: Within functional limitis Intelligibility: Intelligible Motor Planning: Witnin functional limits Motor Speech Errors: Not applicable   [name] I. Hardin Negus, [place], [place] Office number [phone] Pager [place] 06/29/2018, 4:28 PM

## 2018-06-29 NOTE — Progress Notes (Signed)
Pt just had CT. Needs to be seen by neurology first, TBD later time per RN.

## 2018-06-29 NOTE — Progress Notes (Signed)
PT Cancellation Note  Patient Details Name: Peter York MRN: [id number] DOB: Jan 20, 1946   Cancelled Treatment:    Reason Eval/Treat Not Completed: Active bedrest order   Duncan Dull 06/29/2018, 1:41 PM Alben Deeds, PT DPT  Board Certified Neurologic Specialist Acute Rehabilitation Services Pager [phone] Office [phone]

## 2018-06-30 ENCOUNTER — Inpatient Hospital Stay (HOSPITAL_COMMUNITY): Payer: PPO

## 2018-06-30 DIAGNOSIS — N183 Chronic kidney disease, stage 3 (moderate): Secondary | ICD-10-CM

## 2018-06-30 DIAGNOSIS — E785 Hyperlipidemia, unspecified: Secondary | ICD-10-CM

## 2018-06-30 DIAGNOSIS — I161 Hypertensive emergency: Secondary | ICD-10-CM

## 2018-06-30 LAB — GLUCOSE, CAPILLARY
Glucose-Capillary: 199 mg/dL — ABNORMAL HIGH (ref 70–99)
Glucose-Capillary: 171 mg/dL — ABNORMAL HIGH (ref 70–99)
Glucose-Capillary: 172 mg/dL — ABNORMAL HIGH (ref 70–99)

## 2018-06-30 MED ORDER — PANTOPRAZOLE SODIUM 40 MG PO TBEC
40.0000 mg | DELAYED_RELEASE_TABLET | Freq: Every day | ORAL | Status: DC
  Administered 2018-06-30: 40 mg via ORAL
  Filled 2018-06-30: qty 1

## 2018-06-30 MED ORDER — LABETALOL HCL 5 MG/ML IV SOLN: 5.0000 mg | INTRAVENOUS | Status: DC | PRN

## 2018-06-30 NOTE — Progress Notes (Signed)
OT Cancellation Note  Patient Details Name: Peter York MRN: [id number] DOB: [birth year]   Cancelled Treatment:    Reason Eval/Treat Not Completed: Active bedrest order. Will return as schedule allows. Thank you.  [place], OTR/L Acute Rehab Pager: [phone] Office: [phone] 06/30/2018, 7:04 AM

## 2018-06-30 NOTE — Progress Notes (Signed)
Rehab Admissions Coordinator Note:  Patient was screened by Cleatrice Burke for appropriateness for an Inpatient Acute Rehab Consult per PT recommendation.  At this time, we are recommending Inpatient Rehab consult.  Cleatrice Burke RN MSN 06/30/2018, 11:41 AM  I can be reached at [phone].

## 2018-06-30 NOTE — Progress Notes (Addendum)
STROKE TEAM PROGRESS NOTE   INTERVAL HISTORY Pt lying in bed, not in distress.  Still has left arm and leg mild weakness.  Pending swallow, on diet.  BP under control.  Off Cardene.  Vitals:   06/30/18 0700 06/30/18 0800 06/30/18 0900 06/30/18 1000  BP: 123/75 140/70 129/70 130/78  Pulse:  71 69 65  Resp: 13 (!) 21 13 12   Temp:  98.6 F (37 C)    TempSrc:  Oral    SpO2: 98% 98% 95% 97%  Weight:      Height:        CBC:  Recent Labs  Lab 06/29/18 0925 06/29/18 1401  WBC 6.2 10.2  NEUTROABS 4.1  --   HGB 12.1* 11.9*  HCT 35.5* 33.8*  MCV 93.2 90.1  PLT 258 355    Basic Metabolic Panel:  Recent Labs  Lab 06/29/18 0925 06/29/18 1401  NA 136 137  K 4.5 3.9  CL 105 105  CO2 22 19*  GLUCOSE 158* 182*  BUN 36* 31*  CREATININE 1.55* 1.47*  CALCIUM 9.4 9.5   Lipid Panel:     Component Value Date/Time   CHOL 163 06/29/2018 1401   CHOL 170 03/17/2018 1512   CHOL 165 10/02/2012 0834   TRIG 177 (H) 06/29/2018 1401   TRIG 115 03/09/2014 0818   TRIG 95 10/02/2012 0834   HDL 31 (L) 06/29/2018 1401   HDL 32 (L) 03/17/2018 1512   HDL 43 03/09/2014 0818   HDL 51 10/02/2012 0834   CHOLHDL 5.3 06/29/2018 1401   VLDL 35 06/29/2018 1401   LDLCALC 97 06/29/2018 1401   LDLCALC 98 03/17/2018 1512   LDLCALC 223 (H) 09/10/2013 0905   LDLCALC 95 10/02/2012 0834   HgbA1c:  Lab Results  Component Value Date   HGBA1C 6.8 (H) 06/29/2018   Urine Drug Screen:     Component Value Date/Time   LABOPIA NONE DETECTED 06/29/2018 0925   COCAINSCRNUR NONE DETECTED 06/29/2018 0925   LABBENZ NONE DETECTED 06/29/2018 0925   AMPHETMU NONE DETECTED 06/29/2018 0925   THCU NONE DETECTED 06/29/2018 0925   LABBARB NONE DETECTED 06/29/2018 0925    Alcohol Level     Component Value Date/Time   ETH <10 06/29/2018 0925    IMAGING Ct Angio Head W Or Wo Contrast  Result Date: 06/29/2018 CLINICAL DATA:  Left-sided deficit.  Right basal ganglia hemorrhage. EXAM: CT ANGIOGRAPHY HEAD  TECHNIQUE: Multidetector CT imaging of the head was performed using the standard protocol during bolus administration of intravenous contrast. Multiplanar CT image reconstructions and MIPs were obtained to evaluate the vascular anatomy. CONTRAST:  36mL OMNIPAQUE IOHEXOL 350 MG/ML SOLN COMPARISON:  Head CT earlier same day FINDINGS: CTA HEAD Anterior circulation: Both internal carotid arteries are patent through the skull base and siphon regions. There is atherosclerotic calcification in the carotid siphon regions but no stenosis greater than 30%. The anterior and middle cerebral vessels are patent. There is mild atherosclerotic irregularity but no evidence of flow limiting stenosis or large/medium vessel occlusion. 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 This would not relate to the right basal ganglia hemorrhage. Posterior circulation: Both vertebral arteries are patent with the left being dominant. No basilar stenosis. Posterior circulation branch vessels are patent, but show considerable atherosclerotic irregularity. Venous sinuses: Patent and normal. Anatomic variants: None significant. Delayed phase: No abnormal enhancement. IMPRESSION: No large or medium vessel occlusion. No flow limiting proximal stenosis. Diffuse atherosclerotic change of the  more distal intracranial branch vessels. 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 This would not relate to the right basal ganglia intraparenchymal hemorrhage. Electronically Signed   By: Nelson Chimes M.D.   On: 06/29/2018 15:09   Chest 2 View  Result Date: 06/29/2018 CLINICAL DATA:  Intracerebral hemorrhage.  Left-sided weakness. EXAM: CHEST - 2 VIEW COMPARISON:  02/11/2016 FINDINGS: The heart size and mediastinal contours are within normal limits. Both lungs are clear. Several old midthoracic vertebral body compression deformities are again seen. IMPRESSION: No active cardiopulmonary disease. Electronically  Signed   By: Earle Gell M.D.   On: 06/29/2018 15:03   [name] Contrast  Result Date: 06/30/2018 CLINICAL DATA:  Abnormal head CT EXAM: MRI HEAD WITHOUT CONTRAST TECHNIQUE: Multiplanar, multiecho pulse sequences of the brain and surrounding structures were obtained without intravenous contrast. COMPARISON:  Head CT and CTA from yesterday FINDINGS: Brain: T2 iso- and T1 mildly hyper-intense acute hematoma centered in the right putamen, measuring 16 mm. There is a rim of mild vasogenic edema. Diffusion signal extends superiorly towards the lateral ventricle that is blood products rather than neighboring infarct based on preceding CTA. No suspected underlying infarct. No evidence of mass. No evidence of amyloid angiopathy. There is mild chronic small vessel ischemic type change in the cerebral white matter. Vascular: Major flow voids are preserved, reference preceding CTA concerning and anterior communicating artery region aneurysm. Skull and upper cervical spine: Negative for marrow lesion Sinuses/Orbits: Left maxillary sinusitis with central inspissated material and peripheral mucosal thickening. Is also left anterior ethmoid sinusitis. IMPRESSION: 1. Small hematoma in the right basal ganglia without evidence of underlying lesion. No evidence of amyloid angiopathy. Chronic small vessel disease is mild. 2. Obstructed left maxillary sinus. Electronically Signed   By: [place] Fantasia M.D.   On: 06/30/2018 05:59   Ct Head Code Stroke Wo Contrast  Result Date: 06/29/2018 CLINICAL DATA:  Code stroke.  Left arm and leg weakness. EXAM: CT HEAD WITHOUT CONTRAST TECHNIQUE: Contiguous axial images were obtained from the base of the skull through the vertex without intravenous contrast. COMPARISON:  None. FINDINGS: Brain: 15 mm high-density hematoma in the posterior right putamen and extending into the posterior limb internal capsule. No superimposed acute infarct is noted. Mild small vessel ischemic type change in the  cerebral white matter. Remote lacunar infarct in the right caudate body. No hydrocephalus, collection, or masslike finding Vascular: Atherosclerotic calcification.  No hyperdense vessel Skull: Negative Sinuses/Orbits: Opacified left maxillary sinus with findings of mixed density secretions surrounded by low-density mucosal thickening. Other: Critical Value/emergent results were called by telephone at the time of interpretation on 06/29/2018 at 9:38 am to Dr. Nanda Quinton , who verbally acknowledged these results. ASPECTS [facility] Stroke Program Early CT Score) not scored in the setting of hemorrhage IMPRESSION: 1. 15 mm acute hematoma in the right basal ganglia/internal capsule. 2. Chronic small vessel disease. 3. Left maxillary sinusitis. Electronically Signed   By: [place] Fantasia M.D.   On: 06/29/2018 09:41    PHYSICAL EXAM  General - Well nourished, well developed, in no apparent distress.  Ophthalmologic - fundi not visualized due to noncooperation.  Cardiovascular - Regular rate and rhythm.  Mental Status -  Level of arousal and orientation to time, place, and person were intact. Language including expression, naming, repetition, comprehension was assessed and found intact.  Cranial Nerves II - XII - II - Visual field intact OU. III, IV, VI - Extraocular movements intact. V - Facial sensation intact bilaterally. VII -  Facial movement intact bilaterally. VIII - Hearing & vestibular intact bilaterally. X - Palate elevates symmetrically. XI - Chin turning & shoulder shrug intact bilaterally. XII - Tongue protrusion intact.  Motor Strength - The patient's strength was normal in right upper and lower extremities.  However, left upper and lower extremity 4+/5 with mild pronator drift and left hand dexterity difficulty.  Bulk was normal and fasciculations were absent.   Motor Tone - Muscle tone was assessed at the neck and appendages and was normal.  Reflexes - The patient's reflexes were  symmetrical in all extremities and he had no pathological reflexes.  Sensory - Light touch, temperature/pinprick were assessed and were symmetrical.    Coordination - The patient had mild left anger to nose dysmetria.  Tremor was absent.  Gait and Station - deferred.    ASSESSMENT/PLAN Peter York is a 73 y.o. male with history of HTN, HLD, DB presenting with L sided weakness and L hand weakness.   ICH:  right basal ganglia ICH, likely secondary to HTN  Code Stroke CT head R BG/IC hmg, small vessel disease. L maxillary sinusitis  CXR NAD  CTA head no LVO. No stenosis. Diffuse atherosclerosis. 5.5 mm R ACom  MRI  Small R BG hmg w/o underlying lesion. Mild SVD. Obstructed L maxillary sinus  LDL 97  HgbA1c 6.8  UDS normal  SCDs for VTE prophylaxis  No antithrombotic prior to admission, now on No antithrombotic given hmg  Therapy recommendations:  CIR  Disposition:  pending   Hypertension  BP as high as 170/82, now stable   SBP goal < 160  Home meds: norvasc 10, clonidine 0.1, losartan 50, chlorthalidone 25  Off cardene  On norvasc 10, clonidine 0.1, losartan 50  Hyperlipidemia  Home meds:  lipitor 10, resumed in hospital  LDL 97, goal < 70  Continue statin at discharge  Diabetes type II Controlled  HgbA1c 6.8, goal < 7.0  Home meds: glimepiride 4, metformin 500  CBGs  SSI  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 This patient's care requires constant monitoring of vital signs, hemodynamics, respiratory and cardiac monitoring, review of multiple databases, neurological assessment, discussion with family, other specialists and medical decision making of high complexity. I spent 35 minutes of neurocritical care time in the care of this patient.  Rosalin Hawking, MD PhD Stroke Neurology 06/30/2018 2:30 PM    To contact Stroke Continuity provider, please refer to [email or web address] After hours, contact General Neurology

## 2018-06-30 NOTE — Evaluation (Addendum)
Occupational Therapy Evaluation Patient Details Name: Peter York MRN: [id number] DOB: April 27, 1945 Today's Date: 06/30/2018    History of Present Illness Peter York is a 73 y.o. male remote Smoker, HTN, on ASA, is admitted at [facility] in transfer from the [facility] ED with a basal ganglia ICH.    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 Pt currently requiring Min A for ADLs and functional mobility. Pt presenting with impulsivity, decreased safety awareness, and poor coordination at left UE/LE impacting his balance and functional performance. Pt highly motivated to participate in therapy and wants to return to his normal routine and PLOF. Pt would benefit from further acute OT to facilitate safe dc. Recommend dc to CIR for intensive OT to optimize safety, independence with ADLs, and return to PLOF.      Follow Up Recommendations  CIR;Supervision/Assistance - 24 hour    Equipment Recommendations  3 in 1 bedside commode;Other (comment)(Defer to next venue)    Recommendations for Other Services Rehab consult;PT consult;Speech consult     Precautions / Restrictions Precautions Precautions: Fall Restrictions Weight Bearing Restrictions: No      Mobility Bed Mobility Overal bed mobility: Needs Assistance Bed Mobility: Supine to Sit     Supine to sit: Min guard     General bed mobility comments: Min Guard A for safety  Transfers Overall transfer level: Needs assistance Equipment used: None Transfers: Sit to/from Stand Sit to Stand: Min assist         General transfer comment: minA due to slight impulsivity, pt very quick    Balance Overall balance assessment: Needs assistance Sitting-balance support: Feet supported;No upper extremity supported Sitting balance-Leahy Scale: Good     Standing balance support: During functional  activity;Single extremity supported Standing balance-Leahy Scale: Fair Standing balance comment: Able to maintain satic standing but requires atleast single UE support for dynamic                            ADL either performed or assessed with clinical judgement   ADL Overall ADL's : Needs assistance/impaired Eating/Feeding: Independent;Sitting   Grooming: Oral care;Minimal assistance;Standing Grooming Details (indicate cue type and reason): Pt with decreased bilateraly cooridnation, poor grasp with L hand, and coorindation at LUE. Pt also moving quickly and requiring cues to slow down. Mi nA for facilitate normalized movement at LUE. Min A for standing balance Upper Body Bathing: Minimal assistance;Sitting   Lower Body Bathing: Minimal assistance;Sit to/from stand   Upper Body Dressing : Minimal assistance;Sitting   Lower Body Dressing: Minimal assistance;Sit to/from stand   Toilet Transfer: Minimal assistance;Ambulation;RW(simulated to recliner)           Functional mobility during ADLs: Minimal assistance;Rolling walker General ADL Comments: Pt with decreased coorindation at LUE, poor safety awarnees, and impusivity impacting his functional performance. Pt highly motivated     Vision Baseline Vision/History: Wears glasses Wears Glasses: At all times Patient Visual Report: No change from baseline       Perception     Praxis      Pertinent Vitals/Pain Pain Assessment: No/denies pain     Hand Dominance Right   Extremity/Trunk Assessment Upper Extremity Assessment Upper Extremity Assessment: LUE deficits/detail LUE Deficits / Details: Decreased cooridnation both with fine and gross motor. Pt with poor grasp strength.  LUE Coordination: decreased fine  motor;decreased gross motor   Lower Extremity Assessment Lower Extremity Assessment: Defer to PT evaluation LLE Deficits / Details: MMT testing at 4/5 except DF at 3-/5, however impaired co-ordination LLE  Coordination: decreased fine motor;decreased gross motor   Cervical / Trunk Assessment Cervical / Trunk Assessment: Kyphotic   Communication Communication Communication: No difficulties   Cognition Arousal/Alertness: Awake/alert Behavior During Therapy: WFL for tasks assessed/performed;Impulsive Overall Cognitive Status: Impaired/Different from baseline Area of Impairment: Safety/judgement                         Safety/Judgement: Decreased awareness of safety     General Comments: pt mildly impulsive however pt moves fast at baseline. pt aware of deficits however has decreased insight to safety   General Comments  VSS    Exercises Exercises: Hand exercises Hand Exercises Digit Composite Flexion: AROM;Left;10 reps;Seated Composite Extension: AROM;Left;10 reps;Seated Digit Composite Abduction: AROM;Left;10 reps;Seated Digit Composite Adduction: AROM;Left;10 reps;Seated Thumb Abduction: AROM;Left;10 reps;Seated Thumb Adduction: AROM;Left;10 reps;Seated Opposition: AROM;Left;10 reps;Seated(Poor coorindation and unable to reach fifth digit)   Shoulder Instructions      Home Living Family/patient expects to be discharged to:: Private residence Living Arrangements: Spouse/significant other;Children Available Help at Discharge: Family;Available PRN/intermittently(wife is a Psychiatric nurse) Type of Home: House Home Access: Stairs to enter [employer] of Steps: 5(in the front, 10 in the back) Entrance Stairs-Rails: Can reach both Home Layout: One level     Bathroom Shower/Tub: Occupational psychologist: Standard     Home Equipment: Environmental consultant - 2 wheels;Shower seat;Cane - single point      Lives With: Spouse    Prior Functioning/Environment Level of Independence: Independent        Comments: rides horses and trains bird dogs, rides horses to keep up with the dogs        OT Problem List: Decreased strength;Decreased range of motion;Decreased  activity tolerance;Impaired balance (sitting and/or standing);Decreased coordination;Decreased cognition;Decreased safety awareness;Decreased knowledge of use of DME or AE;Decreased knowledge of precautions;Impaired UE functional use      OT Treatment/Interventions: Self-care/ADL training;Therapeutic exercise;Energy conservation;DME and/or AE instruction;Therapeutic activities;Patient/family education;Cognitive remediation/compensation    OT Goals(Current goals can be found in the care plan section) Acute Rehab OT Goals Patient Stated Goal: get back on my horses OT Goal Formulation: With patient Time For Goal Achievement: 07/14/18 Potential to Achieve Goals: Good  OT Frequency: Min 3X/week   Barriers to D/C:            Co-evaluation              AM-PAC OT "6 Clicks" Daily Activity     Outcome Measure Help from another person eating meals?: None Help from another person taking care of personal grooming?: A Little Help from another person toileting, which includes using toliet, bedpan, or urinal?: A Little Help from another person bathing (including washing, rinsing, drying)?: A Little Help from another person to put on and taking off regular upper body clothing?: A Little Help from another person to put on and taking off regular lower body clothing?: A Little 6 Click Score: 19   End of Session Equipment Utilized During Treatment: Gait belt;Rolling walker Nurse Communication: Mobility status  Activity Tolerance: Patient tolerated treatment well Patient left: in chair;with call bell/phone within reach  OT Visit Diagnosis: Unsteadiness on feet (R26.81);Other abnormalities of gait and mobility (R26.89);Muscle weakness (generalized) (M62.81);Other symptoms and signs involving cognitive function;Hemiplegia and hemiparesis Hemiplegia - Right/Left: Left Hemiplegia - dominant/non-dominant: Non-Dominant Hemiplegia -  caused by: Cerebral infarction                Time: 0926-0959 OT  Time Calculation (min): 33 min Charges:  OT General Charges $OT Visit: 1 Visit OT Evaluation $OT Eval Moderate Complexity: [place], OTR/L Acute Rehab Pager: [phone] Office: [place] 06/30/2018, 12:07 PM

## 2018-06-30 NOTE — Evaluation (Signed)
Physical Therapy Evaluation Patient Details Name: Peter York MRN: [id number] DOB: 12/26/1945 Today's Date: 06/30/2018   History of Present Illness  Peter York is a 73 y.o. male remote Smoker, HTN, on ASA, is admitted at [facility] in transfer from the [facility] ED with a basal ganglia ICH.   Clinical Impression  Pt admitted with above. 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 Pt indep and very active PTA. Pt very motivated to rehab to return to work and riding horses. Pt with noted L sided weakness, impaired balance, decreased insight to safety and currently requires physical assist and RW for safe ambulation and ADLs. Pt to strongly benefit from short CIR stay for aggressive therapy to address above mentioned deficits to allow for maximal functional recovery.    Follow Up Recommendations CIR;Supervision/Assistance - 24 hour    Equipment Recommendations  Rolling walker with 5" wheels    Recommendations for Other Services Rehab consult     Precautions / Restrictions Precautions Precautions: Fall Restrictions Weight Bearing Restrictions: No      Mobility  Bed Mobility Overal bed mobility: Needs Assistance             General bed mobility comments: pt sitting EOB with OT, appears to have been min guard  Transfers Overall transfer level: Needs assistance Equipment used: None Transfers: Sit to/from Stand Sit to Stand: Min assist         General transfer comment: minA due to slight impulsivity, pt very quick  Ambulation/Gait Ambulation/Gait assistance: Min assist;Mod assist Gait Distance (Feet): 120 Feet Assistive device: Rolling walker (2 wheeled);1 person hand held assist Gait Pattern/deviations: Step-through pattern;Decreased stride length;Decreased dorsiflexion - left Gait velocity: impulsively fast Gait velocity interpretation: <1.8 ft/sec, indicate of risk for recurrent falls General Gait Details: pt very quick to move stating "i'm a  fast walker"  pt with noted difficulty clearing L foot and tripping, attempted L HHA however unsuccessful, pt given a RW, pt with increased UE dependence, verbal cues and minA for walker management as pt pushing too far out in front, with onset of fatigue pt with increased freq of inability to clear L foot and trip over it requiring min/modA to prevent fall despite having RW. max verbal cues to slow down  Stairs            Wheelchair Mobility    Modified Rankin (Stroke Patients Only) Modified Rankin (Stroke Patients Only) Pre-Morbid Rankin Score: No symptoms Modified Rankin: Moderate disability     Balance Overall balance assessment: Needs assistance Sitting-balance support: Feet supported;No upper extremity supported Sitting balance-Leahy Scale: Good     Standing balance support: During functional activity Standing balance-Leahy Scale: Fair Standing balance comment: stood at sink with OT, may have had unilateral UE supportive on counter                             Pertinent Vitals/Pain Pain Assessment: No/denies pain    Home Living Family/patient expects to be discharged to:: Private residence Living Arrangements: Spouse/significant other;Children Available Help at Discharge: Family;Available PRN/intermittently(wife is a Psychiatric nurse) Type of Home: House Home Access: Stairs to enter Entrance Stairs-Rails: Can reach both Entrance Stairs-Number of Steps: 5(in the front, 10 in the back) Home Layout: One level Home Equipment: Walker - 2 wheels;Shower seat;Cane - single point      Prior Function Level of Independence: Independent  Comments: rides horses and trains bird dogs, rides horses to keep up with the dogs     Hand Dominance   Dominant Hand: Right    Extremity/Trunk Assessment   Upper Extremity Assessment Upper Extremity Assessment: Defer to OT evaluation    Lower Extremity Assessment Lower Extremity Assessment: LLE deficits/detail LLE  Deficits / Details: MMT testing at 4/5 except DF at 3-/5, however impaired co-ordination LLE Coordination: decreased fine motor;decreased gross motor    Cervical / Trunk Assessment Cervical / Trunk Assessment: Kyphotic  Communication   Communication: No difficulties  Cognition Arousal/Alertness: Awake/alert Behavior During Therapy: WFL for tasks assessed/performed;Impulsive Overall Cognitive Status: Impaired/Different from baseline Area of Impairment: Safety/judgement                         Safety/Judgement: Decreased awareness of safety     General Comments: pt mildly impulsive however pt moves fast at baseline. pt aware of deficits however has decreased insight to safety      General Comments General comments (skin integrity, edema, etc.): VSS    Exercises     Assessment/Plan    PT Assessment Patient needs continued PT services  PT Problem List Decreased strength;Decreased activity tolerance;Decreased balance;Decreased mobility;Decreased coordination;Decreased cognition;Decreased knowledge of use of DME;Decreased safety awareness;Decreased knowledge of precautions       PT Treatment Interventions DME instruction;Gait training;Stair training;Functional mobility training;Therapeutic activities;Therapeutic exercise;Balance training;Neuromuscular re-education    PT Goals (Current goals can be found in the Care Plan section)  Acute Rehab PT Goals Patient Stated Goal: get back on my horses PT Goal Formulation: With patient Time For Goal Achievement: 07/14/18 Potential to Achieve Goals: Good Additional Goals Additional Goal #1: Pt to score >19 on DGI to indicate minimall falls risk.    Frequency Min 4X/week   Barriers to discharge        Co-evaluation               AM-PAC PT "6 Clicks" Mobility  Outcome Measure Help needed turning from your back to your side while in a flat bed without using bedrails?: None Help needed moving from lying on your back  to sitting on the side of a flat bed without using bedrails?: A Little Help needed moving to and from a bed to a chair (including a wheelchair)?: A Little Help needed standing up from a chair using your arms (e.g., wheelchair or bedside chair)?: A Little Help needed to walk in hospital room?: A Little Help needed climbing 3-5 steps with a railing? : A Lot 6 Click Score: 18    End of Session Equipment Utilized During Treatment: Gait belt Activity Tolerance: Patient tolerated treatment well Patient left: in chair;with call bell/phone within reach Nurse Communication: Mobility status PT Visit Diagnosis: Unsteadiness on feet (R26.81);Difficulty in walking, not elsewhere classified (R26.2)    Time: 9485-4627 PT Time Calculation (min) (ACUTE ONLY): 28 min   Charges:   PT Evaluation $PT Eval Moderate Complexity: 1 Mod          Kittie Plater, PT, DPT Acute Rehabilitation Services Pager #: [phone] Office #: [phone]   Berline Lopes 06/30/2018, 11:15 AM

## 2018-06-30 NOTE — Progress Notes (Addendum)
Inpatient Rehab Admissions:  Inpatient Rehab Consult received.  I met with patient at the bedside for rehabilitation assessment and to discuss goals and expectations of an inpatient rehab admission.  I will speak with his wife, per his request, and await insurance approval and MD approval for a possible rehab admission tomorrow.    Addendum: I have spoken with patient's wife and she confirms that she and daughter are able to provide 24/7 supervision if needed.   Signed: Shann Medal, PT, DPT Admissions Coordinator [phone] 06/30/18  3:37 PM

## 2018-07-01 ENCOUNTER — Encounter (HOSPITAL_COMMUNITY): Payer: Self-pay

## 2018-07-01 ENCOUNTER — Inpatient Hospital Stay (HOSPITAL_COMMUNITY)
Admission: RE | Admit: 2018-07-01 | Discharge: 2018-07-09 | DRG: 057 | Disposition: A | Discharge status: Home Health | Payer: PPO | Source: Intra-hospital | Attending: Physical Medicine & Rehabilitation | Admitting: Physical Medicine & Rehabilitation

## 2018-07-01 DIAGNOSIS — R002 Palpitations: Secondary | ICD-10-CM | POA: Diagnosis not present

## 2018-07-01 DIAGNOSIS — I639 Cerebral infarction, unspecified: Secondary | ICD-10-CM | POA: Diagnosis not present

## 2018-07-01 DIAGNOSIS — Z79899 Other long term (current) drug therapy: Secondary | ICD-10-CM

## 2018-07-01 DIAGNOSIS — E119 Type 2 diabetes mellitus without complications: Secondary | ICD-10-CM

## 2018-07-01 DIAGNOSIS — Z833 Family history of diabetes mellitus: Secondary | ICD-10-CM | POA: Diagnosis not present

## 2018-07-01 DIAGNOSIS — Z9889 Other specified postprocedural states: Secondary | ICD-10-CM | POA: Diagnosis not present

## 2018-07-01 DIAGNOSIS — Z87891 Personal history of nicotine dependence: Secondary | ICD-10-CM | POA: Diagnosis not present

## 2018-07-01 DIAGNOSIS — Z7982 Long term (current) use of aspirin: Secondary | ICD-10-CM | POA: Diagnosis not present

## 2018-07-01 DIAGNOSIS — I10 Essential (primary) hypertension: Secondary | ICD-10-CM | POA: Diagnosis not present

## 2018-07-01 DIAGNOSIS — Z7984 Long term (current) use of oral hypoglycemic drugs: Secondary | ICD-10-CM | POA: Diagnosis not present

## 2018-07-01 DIAGNOSIS — E1142 Type 2 diabetes mellitus with diabetic polyneuropathy: Secondary | ICD-10-CM | POA: Diagnosis not present

## 2018-07-01 DIAGNOSIS — N189 Chronic kidney disease, unspecified: Secondary | ICD-10-CM | POA: Diagnosis present

## 2018-07-01 DIAGNOSIS — I619 Nontraumatic intracerebral hemorrhage, unspecified: Secondary | ICD-10-CM

## 2018-07-01 DIAGNOSIS — R531 Weakness: Secondary | ICD-10-CM

## 2018-07-01 DIAGNOSIS — I610 Nontraumatic intracerebral hemorrhage in hemisphere, subcortical: Secondary | ICD-10-CM | POA: Diagnosis not present

## 2018-07-01 DIAGNOSIS — R269 Unspecified abnormalities of gait and mobility: Secondary | ICD-10-CM | POA: Diagnosis not present

## 2018-07-01 DIAGNOSIS — E1151 Type 2 diabetes mellitus with diabetic peripheral angiopathy without gangrene: Secondary | ICD-10-CM | POA: Diagnosis present

## 2018-07-01 DIAGNOSIS — I69154 Hemiplegia and hemiparesis following nontraumatic intracerebral hemorrhage affecting left non-dominant side: Secondary | ICD-10-CM | POA: Diagnosis not present

## 2018-07-01 DIAGNOSIS — I69398 Other sequelae of cerebral infarction: Secondary | ICD-10-CM | POA: Diagnosis not present

## 2018-07-01 DIAGNOSIS — I611 Nontraumatic intracerebral hemorrhage in hemisphere, cortical: Secondary | ICD-10-CM

## 2018-07-01 DIAGNOSIS — K922 Gastrointestinal hemorrhage, unspecified: Secondary | ICD-10-CM | POA: Diagnosis not present

## 2018-07-01 DIAGNOSIS — E782 Mixed hyperlipidemia: Secondary | ICD-10-CM | POA: Diagnosis present

## 2018-07-01 DIAGNOSIS — E7800 Pure hypercholesterolemia, unspecified: Secondary | ICD-10-CM | POA: Diagnosis not present

## 2018-07-01 DIAGNOSIS — N182 Chronic kidney disease, stage 2 (mild): Secondary | ICD-10-CM | POA: Diagnosis not present

## 2018-07-01 DIAGNOSIS — E1159 Type 2 diabetes mellitus with other circulatory complications: Secondary | ICD-10-CM | POA: Diagnosis not present

## 2018-07-01 DIAGNOSIS — I129 Hypertensive chronic kidney disease with stage 1 through stage 4 chronic kidney disease, or unspecified chronic kidney disease: Secondary | ICD-10-CM | POA: Diagnosis present

## 2018-07-01 DIAGNOSIS — M542 Cervicalgia: Secondary | ICD-10-CM | POA: Diagnosis not present

## 2018-07-01 DIAGNOSIS — K529 Noninfective gastroenteritis and colitis, unspecified: Secondary | ICD-10-CM | POA: Diagnosis not present

## 2018-07-01 DIAGNOSIS — E1122 Type 2 diabetes mellitus with diabetic chronic kidney disease: Secondary | ICD-10-CM | POA: Diagnosis not present

## 2018-07-01 LAB — GLUCOSE, CAPILLARY
Glucose-Capillary: 158 mg/dL — ABNORMAL HIGH (ref 70–99)
Glucose-Capillary: 209 mg/dL — ABNORMAL HIGH (ref 70–99)
Glucose-Capillary: 95 mg/dL (ref 70–99)
Glucose-Capillary: 163 mg/dL — ABNORMAL HIGH (ref 70–99)

## 2018-07-01 MED ORDER — INSULIN ASPART 100 UNIT/ML ~~LOC~~ SOLN
0.0000 [IU] | Freq: Three times a day (TID) | SUBCUTANEOUS | Status: DC
  Administered 2018-07-02: 3 [IU] via SUBCUTANEOUS
  Administered 2018-07-02: 18:00:00 5 [IU] via SUBCUTANEOUS
  Administered 2018-07-03: 13:00:00 3 [IU] via SUBCUTANEOUS
  Administered 2018-07-03: 19:00:00 2 [IU] via SUBCUTANEOUS
  Administered 2018-07-03 – 2018-07-04 (×2): 3 [IU] via SUBCUTANEOUS
  Administered 2018-07-04: 08:00:00 8 [IU] via SUBCUTANEOUS
  Administered 2018-07-05 (×3): 3 [IU] via SUBCUTANEOUS
  Administered 2018-07-06: 13:00:00 2 [IU] via SUBCUTANEOUS
  Administered 2018-07-06: 08:00:00 3 [IU] via SUBCUTANEOUS
  Administered 2018-07-07 – 2018-07-08 (×4): 2 [IU] via SUBCUTANEOUS

## 2018-07-01 MED ORDER — CHLORTHALIDONE 25 MG PO TABS
25.0000 mg | ORAL_TABLET | Freq: Every day | ORAL | Status: DC
  Administered 2018-07-02 – 2018-07-09 (×8): 25 mg via ORAL
  Filled 2018-07-01 (×8): qty 1

## 2018-07-01 MED ORDER — ATORVASTATIN CALCIUM 10 MG PO TABS
10.0000 mg | ORAL_TABLET | Freq: Every day | ORAL | Status: DC
  Administered 2018-07-01 – 2018-07-08 (×8): 10 mg via ORAL
  Filled 2018-07-01 (×8): qty 1

## 2018-07-01 MED ORDER — VITAMIN E 180 MG (400 UNIT) PO CAPS
400.0000 [IU] | ORAL_CAPSULE | Freq: Every day | ORAL | Status: DC
  Administered 2018-07-01 – 2018-07-08 (×8): 400 [IU] via ORAL
  Filled 2018-07-01 (×9): qty 1

## 2018-07-01 MED ORDER — COLCHICINE 0.6 MG PO TABS: 0.6000 mg | ORAL_TABLET | ORAL | Status: DC

## 2018-07-01 MED ORDER — ACETAMINOPHEN 650 MG RE SUPP: 650.0000 mg | RECTAL | Status: DC | PRN

## 2018-07-01 MED ORDER — CALCIUM CARBONATE ANTACID 500 MG PO CHEW: 1.0000 | CHEWABLE_TABLET | Freq: Every day | ORAL | Status: DC | PRN

## 2018-07-01 MED ORDER — SORBITOL 70 % SOLN: 30.0000 mL | Freq: Every day | Status: DC | PRN

## 2018-07-01 MED ORDER — LOSARTAN POTASSIUM 50 MG PO TABS
50.0000 mg | ORAL_TABLET | Freq: Every day | ORAL | Status: DC
  Administered 2018-07-02 – 2018-07-09 (×8): 50 mg via ORAL
  Filled 2018-07-01 (×8): qty 1

## 2018-07-01 MED ORDER — ACETAMINOPHEN 325 MG PO TABS
650.0000 mg | ORAL_TABLET | ORAL | Status: DC | PRN
  Administered 2018-07-02 – 2018-07-08 (×9): 650 mg via ORAL
  Filled 2018-07-01 (×8): qty 2

## 2018-07-01 MED ORDER — VITAMIN D 1000 UNITS PO TABS: 1000.0000 [IU] | ORAL_TABLET | Freq: Every day | ORAL | Status: DC

## 2018-07-01 MED ORDER — ATORVASTATIN CALCIUM 10 MG PO TABS: 10.0000 mg | ORAL_TABLET | Freq: Every day | ORAL | Status: DC

## 2018-07-01 MED ORDER — LOSARTAN POTASSIUM 25 MG PO TABS: 50.0000 mg | ORAL_TABLET | Freq: Every day | ORAL | Status: DC

## 2018-07-01 MED ORDER — ACETAMINOPHEN 160 MG/5ML PO SOLN: 650.0000 mg | ORAL | Status: DC | PRN

## 2018-07-01 MED ORDER — CLONIDINE HCL 0.1 MG PO TABS
0.1000 mg | ORAL_TABLET | Freq: Every day | ORAL | Status: DC
  Administered 2018-07-02 – 2018-07-09 (×8): 0.1 mg via ORAL
  Filled 2018-07-01 (×8): qty 1

## 2018-07-01 MED ORDER — SENNOSIDES-DOCUSATE SODIUM 8.6-50 MG PO TABS
1.0000 | ORAL_TABLET | Freq: Two times a day (BID) | ORAL | Status: DC
  Administered 2018-07-01 – 2018-07-09 (×16): 1 via ORAL
  Filled 2018-07-01 (×16): qty 1

## 2018-07-01 MED ORDER — AMLODIPINE BESYLATE 10 MG PO TABS
10.0000 mg | ORAL_TABLET | Freq: Every day | ORAL | Status: DC
  Administered 2018-07-01 – 2018-07-08 (×8): 10 mg via ORAL
  Filled 2018-07-01 (×8): qty 1

## 2018-07-01 MED ORDER — PANTOPRAZOLE SODIUM 40 MG PO TBEC
40.0000 mg | DELAYED_RELEASE_TABLET | Freq: Every day | ORAL | Status: DC
  Administered 2018-07-01 – 2018-07-08 (×8): 40 mg via ORAL
  Filled 2018-07-01 (×8): qty 1

## 2018-07-01 MED ORDER — SENNOSIDES-DOCUSATE SODIUM 8.6-50 MG PO TABS: 1.0000 | ORAL_TABLET | Freq: Two times a day (BID) | ORAL | Status: DC

## 2018-07-01 MED ORDER — INSULIN ASPART 100 UNIT/ML ~~LOC~~ SOLN: 0.0000 [IU] | Freq: Three times a day (TID) | SUBCUTANEOUS | 11 refills | Status: DC

## 2018-07-01 NOTE — Progress Notes (Signed)
Occupational Therapy Treatment Patient Details Name: Peter York MRN: [id number] DOB: 02-09-1946 Today's Date: 07/01/2018    History of present illness Peter York is a 73 y.o. male remote Smoker, HTN, on ASA, is admitted at [facility] in transfer from the [facility] ED with a basal ganglia ICH.    OT comments  Pt progressing well towards established OT goals and is highly motivated to participate in therapy. Pt shaving his face at the sink with Min Guard A for safety and cues to weight bear through LUE; noting flexion synergy pattern and inattention of LUE during grooming. Pt performing functional mobility with Min Guard and RW requiring cues to slow down and normalize step pattern of LLE. Pt participating in activity focused on LUE coordination and dynamic balance. Continue to recommend dc to CIR and will continue to follow acutely as admitted.    Follow Up Recommendations  CIR;Supervision/Assistance - 24 hour    Equipment Recommendations  3 in 1 bedside commode;Other (comment)(Defer to next venue)    Recommendations for Other Services Rehab consult;PT consult;Speech consult    Precautions / Restrictions Precautions Precautions: Fall Restrictions Weight Bearing Restrictions: No       Mobility Bed Mobility Overal bed mobility: Needs Assistance Bed Mobility: Supine to Sit;Sit to Supine     Supine to sit: Min guard Sit to supine: Min guard   General bed mobility comments: Min Guard A for safety  Transfers Overall transfer level: Needs assistance Equipment used: None Transfers: Sit to/from Stand Sit to Stand: Min guard         General transfer comment: Min Guard A for safety in standing. Cues for hand placement    Balance Overall balance assessment: Needs assistance Sitting-balance support: Feet supported;No upper extremity supported Sitting balance-Leahy Scale: Good     Standing balance support: During functional activity;Bilateral upper extremity  supported Standing balance-Leahy Scale: Poor Standing balance comment: Reliant on BUE support                            ADL either performed or assessed with clinical judgement   ADL Overall ADL's : Needs assistance/impaired Eating/Feeding: Independent;Sitting   Grooming: Oral care;Standing;Min guard Grooming Details (indicate cue type and reason): Pt shaving while at sink with Min guard A for safety. Pt demonstrating flexion synergy pattern of left hand while he shaves with right. brought to pt's attention for education. Cues for weight bearing of LUE on sink.                  Toilet Transfer: Minimal assistance;Min guard;Ambulation;RW(simulated in room)           Functional mobility during ADLs: Minimal assistance;Min guard;Rolling walker General ADL Comments: Pt continues to present with decreased coordination and attention to left UE/LE.      Vision       Perception     Praxis      Cognition Arousal/Alertness: Awake/alert Behavior During Therapy: WFL for tasks assessed/performed;Impulsive Overall Cognitive Status: Impaired/Different from baseline Area of Impairment: Safety/judgement                         Safety/Judgement: Decreased awareness of safety     General Comments: Continues to present with decreased safety awareness. Very motivated.         Exercises Exercises: Other exercises Other Exercises Other Exercises: Reviewed hand and UE exericses from prior session. Pt demosntrating recall.  Other Exercises: Challenging pt sitting balance and LUE coorindation. Pt reaching across mid line with LUE for objects then tossing them into bucket on right side. Pt reporting how awkward it felt but he was miotivated to continue to increase his function and coordination   Shoulder Instructions       General Comments      Pertinent Vitals/ Pain       Pain Assessment: No/denies pain  Home Living                                           Prior Functioning/Environment              Frequency  Min 3X/week        Progress Toward Goals  OT Goals(current goals can now be found in the care plan section)  Progress towards OT goals: Progressing toward goals  Acute Rehab OT Goals Patient Stated Goal: get back on my horses OT Goal Formulation: With patient Time For Goal Achievement: 07/14/18 Potential to Achieve Goals: Good ADL Goals Pt Will Perform Grooming: Independently;standing Pt Will Transfer to Toilet: Independently;ambulating;regular height toilet Pt Will Perform Toileting - Clothing Manipulation and hygiene: Independently;sit to/from stand Pt/caregiver will Perform Home Exercise Program: Increased ROM;Increased strength;Independently;With written HEP provided(FM)  Plan Discharge plan remains appropriate    Co-evaluation                 AM-PAC OT "6 Clicks" Daily Activity     Outcome Measure   Help from another person eating meals?: None Help from another person taking care of personal grooming?: A Little Help from another person toileting, which includes using toliet, bedpan, or urinal?: A Little Help from another person bathing (including washing, rinsing, drying)?: A Little Help from another person to put on and taking off regular upper body clothing?: A Little Help from another person to put on and taking off regular lower body clothing?: A Little 6 Click Score: 19    End of Session Equipment Utilized During Treatment: Gait belt;Rolling walker  OT Visit Diagnosis: Unsteadiness on feet (R26.81);Other abnormalities of gait and mobility (R26.89);Muscle weakness (generalized) (M62.81);Other symptoms and signs involving cognitive function;Hemiplegia and hemiparesis Hemiplegia - Right/Left: Left Hemiplegia - dominant/non-dominant: Non-Dominant Hemiplegia - caused by: Cerebral infarction   Activity Tolerance Patient tolerated treatment well   Patient Left in chair;with call  bell/phone within reach   Nurse Communication Mobility status        Time: 1356-1420 OT Time Calculation (min): 24 min  Charges: OT General Charges $OT Visit: 1 Visit OT Treatments $Self Care/Home Management : 8-22 mins $Therapeutic Activity: 8-22 mins  [place], OTR/L Acute Rehab Pager: [phone] Office: [place] 07/01/2018, 5:04 PM

## 2018-07-01 NOTE — H&P (Signed)
Physical Medicine and Rehabilitation Admission H&P    Chief Complaint  Patient presents with   Code Stroke  Chief complaint:left side weakness  HPI: Peter York is a 73 year old right-handed male history of hypertension maintained on Norvasc 10 mg daily, clonidine 0.1 mg daily, chlorthalidone 25 mg daily,Cozaar 50 mg daily diabetes mellitus maintained on Amaryl 4 mg daily and metformin 500 mg twice a day, hyperlipidemia.Per chart review and patient, patient lives with spouse. Independent and very active.One level home with 5 steps to entry. Wife is a Psychiatric nurse. They have a daughter that can also assist. Presented 06/29/2018 to [facility] with left side weakness and balance deficis. Blood pressure 170/82. CT of the head showed a 15 mm acute hematoma right basal ganglia internal capsule. CT angiogram of the head showed no large or medium vessel occlusion. 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 Patient was transferred to [facility] for further evaluation. Maintained on Cardene drip for blood pressure control. Follow-up MRI 06/30/2018 showed small hematoma right basal ganglia without evidence of underlying lesion. No evidence of amyloid angiopathy. Neurology follow-up noted blood pressure control Cardene drip later discontinued. Patient is tolerating a regular diet. Therapy evaluations completed with recommendations of physical medicine rehabilitation consult. Patient was admitted for a comprehensive rehabilitation program.  Review of Systems  Constitutional: Negative for chills and fever.  HENT: Negative for hearing loss.   Eyes: Negative for blurred vision and double vision.  Respiratory: Negative for cough and shortness of breath.   Cardiovascular: Negative for chest pain, palpitations and leg swelling.  Gastrointestinal: Positive for constipation. Negative for nausea and vomiting.  Genitourinary: Negative for dysuria, flank pain and hematuria.  Musculoskeletal: Positive for  joint pain and myalgias.  Skin: Negative for rash.  Neurological: Positive for focal weakness and weakness. Negative for speech change.       Poor balance as well as intermittent headaches  All other systems reviewed and are negative.  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 Cornett, MD;  Location: [facility];  Service: General;  Laterality: N/A;  excision perianal mass    APPENDECTOMY     COLONOSCOPY W/ POLYPECTOMY  12/31/2005   Dr. Silvano Rusk   UMBILICAL HERNIA REPAIR     Family History  Problem Relation Age of Onset   Colon cancer Father 13   Colon polyps Father    Cancer Father 6       Colon   Heart attack Maternal Uncle        X 4   Heart disease Maternal Uncle    Heart disease Maternal Uncle    Heart disease Maternal Uncle    Diabetes Daughter 5       Type 1   Transient ischemic attack Brother 32   Social History:  reports that he quit smoking about 40 years ago. His smoking use included cigarettes. He has a 11.25 pack-year smoking history. He has never used smokeless tobacco. He reports current alcohol use. He reports that he does not use drugs. Allergies:  Allergies  Allergen Reactions   Crestor [Rosuvastatin Calcium] Other (See Comments)    "like to killed me." joint pain, unable to get OOB   Benazepril Other (See Comments)  hyperkalemia   Medications Prior to Admission  Medication Sig Dispense Refill   acetaminophen (TYLENOL) 650 MG CR tablet Take 650 mg by mouth daily as needed for pain.     amLODipine (NORVASC) 10 MG tablet Take 1 tablet (10 mg total) by mouth daily. (Patient taking differently: Take 10 mg by mouth at bedtime. ) 90 tablet 3   aspirin EC 81 MG tablet Take 81 mg by mouth  at bedtime.     atorvastatin (LIPITOR) 10 MG tablet Take 1 tablet (10 mg total) by mouth daily. (Patient taking differently: Take 10 mg by mouth at bedtime. ) 90 tablet 3   Calcium Carbonate-Vitamin D (CALCIUM-D PO) Take 1 tablet by mouth at bedtime.     chlorthalidone (HYGROTON) 25 MG tablet Take 1 tablet (25 mg total) by mouth daily. 90 tablet 3   cholecalciferol (VITAMIN D) 1000 units tablet Take 1 tablet (1,000 Units total) by mouth daily. (Patient taking differently: Take 1,000 Units by mouth at bedtime. )     cloNIDine (CATAPRES) 0.1 MG tablet Take 1 tablet (0.1 mg total) by mouth daily. 180 tablet 1   colchicine 0.6 MG tablet TAKE 2 TABLETS BY MOUTH AT GOUT ONSET. MAY REPEAT 1 TABLET 2 HOURS LATER (ONCE IN 24 HOURS) (Patient taking differently: Take 0.6-1.2 mg by mouth See admin instructions. Take 2 tablets (1.2 mg) by mouth at onset of gout, may take 1 additional tablet (0.6 mg) 2 hours later (once in 24 hours)) 90 tablet 0   cycloSPORINE (RESTASIS) 0.05 % ophthalmic emulsion Place 1 drop into both eyes 2 (two) times daily as needed (dry eyes).     glimepiride (AMARYL) 4 MG tablet Take 1 tablet (4 mg total) by mouth daily with breakfast. (Patient taking differently: Take 2-4 mg by mouth daily with breakfast. ) 90 tablet 0   losartan (COZAAR) 25 MG tablet Take 2 tablets (50 mg total) by mouth daily. (Patient taking differently: Take 50 mg by mouth at bedtime. ) 180 tablet 1   metFORMIN (GLUCOPHAGE) 500 MG tablet Take 1 tablet (500 mg total) by mouth 2 (two) times daily with a meal. (Patient taking differently: Take 1,000 mg by mouth at bedtime. ) 180 tablet 1   OVER THE COUNTER MEDICATION Take 1 tablet by mouth 2 (two) times daily. Beta Prostrate supplement     vitamin E 400 UNIT capsule Take 1 capsule (400 Units total) by mouth daily. (Patient taking differently: Take 400 Units by mouth at bedtime. ) 30 capsule 3   alendronate (FOSAMAX) 70 MG tablet Take 1 tablet (70 mg total) by  mouth every 7 (seven) days. Take with a full glass of water on an empty stomach. (Patient not taking: Reported on 06/29/2018) 12 tablet 2   glucose blood test strip USE TO CHECK BLOOD SUGAR ONCE A DAY AS INSTRUCTED 100 each 8    Drug Regimen Review Drug regimen was reviewed and remains appropriate with no significant issues identified  Home: Home Living Family/patient expects to be discharged to:: Private residence Living Arrangements: Spouse/significant other, Children Available Help at Discharge: Family, Available PRN/intermittently(wife is a Psychiatric nurse) Type of Home: House Home Access: Stairs to enter [employer] of Steps: 5(in the front, 10 in the back) Entrance Stairs-Rails: Can reach both Home Layout: One level Bathroom Shower/Tub: Multimedia programmer: Standard Home Equipment: Environmental consultant - 2 wheels, Shower seat, Radio producer - single point  Lives With: Spouse   Functional History: Prior Function Level of Independence: Independent Comments: rides horses and  trains bird dogs, rides horses to keep up with the dogs  Functional Status:  Mobility: Bed Mobility Overal bed mobility: Needs Assistance Bed Mobility: Supine to Sit Supine to sit: Min guard General bed mobility comments: Min Guard A for safety Transfers Overall transfer level: Needs assistance Equipment used: None Transfers: Sit to/from Stand Sit to Stand: Min assist General transfer comment: minA due to slight impulsivity, pt very quick Ambulation/Gait Ambulation/Gait assistance: Min assist, Mod assist Gait Distance (Feet): 120 Feet Assistive device: Rolling walker (2 wheeled), 1 person hand held assist Gait Pattern/deviations: Step-through pattern, Decreased stride length, Decreased dorsiflexion - left General Gait Details: pt very quick to move stating "i'm a fast walker"  pt with noted difficulty clearing L foot and tripping, attempted L HHA however unsuccessful, pt given a RW, pt with increased UE  dependence, verbal cues and minA for walker management as pt pushing too far out in front, with onset of fatigue pt with increased freq of inability to clear L foot and trip over it requiring min/modA to prevent fall despite having RW. max verbal cues to slow down Gait velocity: impulsively fast Gait velocity interpretation: <1.8 ft/sec, indicate of risk for recurrent falls    ADL: ADL Overall ADL's : Needs assistance/impaired Eating/Feeding: Independent, Sitting Grooming: Oral care, Minimal assistance, Standing Grooming Details (indicate cue type and reason): Pt with decreased bilateraly cooridnation, poor grasp with L hand, and coorindation at LUE. Pt also moving quickly and requiring cues to slow down. Mi nA for facilitate normalized movement at LUE. Min A for standing balance Upper Body Bathing: Minimal assistance, Sitting Lower Body Bathing: Minimal assistance, Sit to/from stand Upper Body Dressing : Minimal assistance, Sitting Lower Body Dressing: Minimal assistance, Sit to/from stand Toilet Transfer: Minimal assistance, Ambulation, RW(simulated to recliner) Functional mobility during ADLs: Minimal assistance, Rolling walker General ADL Comments: Pt with decreased coorindation at LUE, poor safety awarnees, and impusivity impacting his functional performance. Pt highly motivated  Cognition: Cognition Overall Cognitive Status: Impaired/Different from baseline Arousal/Alertness: Awake/alert Orientation Level: Oriented X4 Attention: Focused, Sustained Focused Attention: Appears intact(Vigilance WNL: 1/1) Sustained Attention: Appears intact(Serial 7s: 3/3) Memory: Appears intact Awareness: Appears intact(Immediate: 5/5; delayed: 5/5) Problem Solving: Appears intact Executive Function: Reasoning, Sequencing Reasoning: Appears intact(Abstraction: 2/2) Sequencing: Appears intact(Clock drawing: 3/3) Cognition Arousal/Alertness: Awake/alert Behavior During Therapy: WFL for tasks  assessed/performed, Impulsive Overall Cognitive Status: Impaired/Different from baseline Area of Impairment: Safety/judgement Safety/Judgement: Decreased awareness of safety General Comments: pt mildly impulsive however pt moves fast at baseline. pt aware of deficits however has decreased insight to safety  Physical Exam: Blood pressure 123/70, pulse 69, temperature 98.8 F (37.1 C), temperature source Oral, resp. rate 17, height 5\' 8"  (1.727 m), weight 72 kg, SpO2 96 %. Physical Exam  Vitals reviewed. Constitutional: He is oriented to person, place, and time. He appears well-developed and well-nourished.  HENT:  Head: Normocephalic and atraumatic.  Eyes: EOM are normal. Right eye exhibits no discharge. Left eye exhibits no discharge.  Respiratory: Effort normal.  GI: He exhibits no distension.  Musculoskeletal:     Comments: No edema or tenderness in lower extremities  Neurological: He is alert and oriented to person, place, and time.  Follows full commands.  Speech is fluent. Motor: RLE: 5/5 proximal to distal LUE/LLE: 4-/5 proximal to distal  Skin: Skin is warm and dry.  Psychiatric: He has a normal mood and affect. His behavior is normal.    Results for orders placed or performed during the hospital encounter of 06/29/18 (from  the past 48 hour(s))  Ethanol     Status: None   Collection Time: 06/29/18  9:25 AM  Result Value Ref Range   Alcohol, Ethyl (B) <10 <10 mg/dL    Comment: (NOTE) Lowest detectable limit for serum alcohol is 10 mg/dL. For medical purposes only. Performed at [facility], [address], [place], [place] [address]   Protime-INR     Status: None   Collection Time: 06/29/18  9:25 AM  Result Value Ref Range   Prothrombin Time 13.2 11.4 - 15.2 seconds   INR 1.0 0.8 - 1.2    Comment: (NOTE) INR goal varies based on device and disease states. Performed at [facility], [address] [place], [place] [address]   APTT     Status: None   Collection  Time: 06/29/18  9:25 AM  Result Value Ref Range   aPTT 29 24 - 36 seconds    Comment: Performed at [facility], [address], [place], [place] [address]  CBC     Status: Abnormal   Collection Time: 06/29/18  9:25 AM  Result Value Ref Range   WBC 6.2 4.0 - 10.5 K/uL   RBC 3.81 (L) 4.22 - 5.81 MIL/uL   Hemoglobin 12.1 (L) 13.0 - 17.0 g/dL   HCT 35.5 (L) 39.0 - 52.0 %   MCV 93.2 80.0 - 100.0 fL   MCH 31.8 26.0 - 34.0 pg   MCHC 34.1 30.0 - 36.0 g/dL   RDW 13.0 11.5 - 15.5 %   Platelets 258 150 - 400 K/uL   nRBC 0.0 0.0 - 0.2 %    Comment: Performed at [facility], [address] [place], [place] [address]  Differential     Status: None   Collection Time: 06/29/18  9:25 AM  Result Value Ref Range   Neutrophils Relative % 67 %   Neutro Abs 4.1 1.7 - 7.7 K/uL   Lymphocytes Relative 18 %   Lymphs Abs 1.1 0.7 - 4.0 K/uL   Monocytes Relative 6 %   Monocytes Absolute 0.4 0.1 - 1.0 K/uL   Eosinophils Relative 7 %   Eosinophils Absolute 0.5 0.0 - 0.5 K/uL   Basophils Relative 1 %   Basophils Absolute 0.1 0.0 - 0.1 K/uL   Immature Granulocytes 1 %   Abs Immature Granulocytes 0.03 0.00 - 0.07 K/uL    Comment: Performed at [facility], [address], [place], [place] [address]  Comprehensive metabolic panel     Status: Abnormal   Collection Time: 06/29/18  9:25 AM  Result Value Ref Range   Sodium 136 135 - 145 mmol/L   Potassium 4.5 3.5 - 5.1 mmol/L   Chloride 105 98 - 111 mmol/L   CO2 22 22 - 32 mmol/L   Glucose, Bld 158 (H) 70 - 99 mg/dL   BUN 36 (H) 8 - 23 mg/dL   Creatinine, Ser 1.55 (H) 0.61 - 1.24 mg/dL   Calcium 9.4 8.9 - 10.3 mg/dL   Total Protein 8.1 6.5 - 8.1 g/dL   Albumin 4.6 3.5 - 5.0 g/dL   AST 19 15 - 41 U/L   ALT 17 0 - 44 U/L   Alkaline Phosphatase 65 38 - 126 U/L   Total Bilirubin 0.7 0.3 - 1.2 mg/dL   GFR calc non Af Amer 44 (L) >60 mL/min   GFR calc Af Amer 51 (L) >60 mL/min   Anion gap 9 5 - 15    Comment: Performed at [facility], [address]  [place], [place] [address]  Urine rapid drug screen (hosp performed)     Status: None   Collection Time: 06/29/18  9:25 AM  Result Value Ref Range   Opiates NONE DETECTED NONE DETECTED   Cocaine NONE DETECTED NONE DETECTED   Benzodiazepines NONE DETECTED NONE DETECTED   Amphetamines NONE DETECTED NONE DETECTED   Tetrahydrocannabinol NONE DETECTED NONE DETECTED   Barbiturates NONE DETECTED NONE DETECTED    Comment: (NOTE) DRUG SCREEN FOR MEDICAL PURPOSES ONLY.  IF CONFIRMATION IS NEEDED FOR ANY PURPOSE, NOTIFY LAB WITHIN 5 DAYS. LOWEST DETECTABLE LIMITS FOR URINE DRUG SCREEN Drug Class                     Cutoff (ng/mL) Amphetamine and metabolites    1000 Barbiturate and metabolites    200 Benzodiazepine                 998 Tricyclics and metabolites     300 Opiates and metabolites        300 Cocaine and metabolites        300 THC                            50 Performed at [facility] [place], [place] [address]   Urinalysis, Routine w reflex microscopic     Status: None   Collection Time: 06/29/18  9:25 AM  Result Value Ref Range   Color, Urine YELLOW YELLOW   APPearance CLEAR CLEAR   Specific Gravity, Urine 1.015 1.005 - 1.030   pH 7.0 5.0 - 8.0   Glucose, UA NEGATIVE NEGATIVE mg/dL   Hgb urine dipstick NEGATIVE NEGATIVE   Bilirubin Urine NEGATIVE NEGATIVE   Ketones, ur NEGATIVE NEGATIVE mg/dL   Protein, ur NEGATIVE NEGATIVE mg/dL   Nitrite NEGATIVE NEGATIVE   Leukocytes,Ua NEGATIVE NEGATIVE    Comment: Performed at [facility], [address], [place], [place] [address]  MRSA PCR Screening     Status: None   Collection Time: 06/29/18  1:21 PM  Result Value Ref Range   MRSA by PCR NEGATIVE NEGATIVE    Comment:        The GeneXpert MRSA Assay (FDA approved for NASAL specimens only), is one component of a comprehensive MRSA colonization surveillance program. It is not intended to diagnose MRSA infection nor to guide or monitor treatment for MRSA  infections. Performed at [facility], [place] [address], [place], [place] [address]   CBC     Status: Abnormal   Collection Time: 06/29/18  2:01 PM  Result Value Ref Range   WBC 10.2 4.0 - 10.5 K/uL   RBC 3.75 (L) 4.22 - 5.81 MIL/uL   Hemoglobin 11.9 (L) 13.0 - 17.0 g/dL   HCT 33.8 (L) 39.0 - 52.0 %   MCV 90.1 80.0 - 100.0 fL   MCH 31.7 26.0 - 34.0 pg   MCHC 35.2 30.0 - 36.0 g/dL   RDW 12.8 11.5 - 15.5 %   Platelets 259 150 - 400 K/uL   nRBC 0.0 0.0 - 0.2 %    Comment: Performed at [facility], [place] [address] [place], [place] [address]  Comprehensive metabolic panel     Status: Abnormal   Collection Time: 06/29/18  2:01 PM  Result Value Ref Range   Sodium 137 135 - 145 mmol/L   Potassium 3.9 3.5 - 5.1 mmol/L   Chloride 105  98 - 111 mmol/L   CO2 19 (L) 22 - 32 mmol/L   Glucose, Bld 182 (H) 70 - 99 mg/dL   BUN 31 (H) 8 - 23 mg/dL   Creatinine, Ser 1.47 (H) 0.61 - 1.24 mg/dL   Calcium 9.5 8.9 - 10.3 mg/dL   Total Protein 7.5 6.5 - 8.1 g/dL   Albumin 4.3 3.5 - 5.0 g/dL   AST 18 15 - 41 U/L   ALT 17 0 - 44 U/L   Alkaline Phosphatase 59 38 - 126 U/L   Total Bilirubin 0.9 0.3 - 1.2 mg/dL   GFR calc non Af Amer 47 (L) >60 mL/min   GFR calc Af Amer 54 (L) >60 mL/min   Anion gap 13 5 - 15    Comment: Performed at [facility] [address] [place], [place] [address]  APTT     Status: None   Collection Time: 06/29/18  2:01 PM  Result Value Ref Range   aPTT 30 24 - 36 seconds    Comment: Performed at [facility], [place] [address] [place], [place] [address]  Protime-INR     Status: None   Collection Time: 06/29/18  2:01 PM  Result Value Ref Range   Prothrombin Time 14.3 11.4 - 15.2 seconds   INR 1.1 0.8 - 1.2    Comment: (NOTE) INR goal varies based on device and disease states. Performed at [facility], [place] [address] [place], [place] [address]   Hemoglobin A1c     Status: Abnormal   Collection Time: 06/29/18  2:01 PM  Result Value Ref  Range   Hgb A1c MFr Bld 6.8 (H) 4.8 - 5.6 %    Comment: (NOTE) Pre diabetes:          5.7%-6.4% Diabetes:              >6.4% Glycemic control for   <7.0% adults with diabetes    Mean Plasma Glucose 148.46 mg/dL    Comment: Performed at [facility] [address] [place], [place] [address]  Lipid panel     Status: Abnormal   Collection Time: 06/29/18  2:01 PM  Result Value Ref Range   Cholesterol 163 0 - 200 mg/dL   Triglycerides 177 (H) <150 mg/dL   HDL 31 (L) >40 mg/dL   Total CHOL/HDL Ratio 5.3 RATIO   VLDL 35 0 - 40 mg/dL   LDL Cholesterol 97 0 - 99 mg/dL    Comment:        Total Cholesterol/HDL:CHD Risk Coronary Heart Disease Risk Table                     Men   Women  1/2 Average Risk   3.4   3.3  Average Risk       5.0   4.4  2 X Average Risk   9.6   7.1  3 X Average Risk  23.4   11.0        Use the calculated Patient Ratio above and the CHD Risk Table to determine the patient's CHD Risk.        ATP III CLASSIFICATION (LDL):  <100     mg/dL   Optimal  100-129  mg/dL   Near or Above                    Optimal  130-159  mg/dL   Borderline  160-189  mg/dL  High  >190     mg/dL   Very High Performed at [facility] [address], [place], [place] [address]   Glucose, capillary     Status: Abnormal   Collection Time: 06/29/18  6:08 PM  Result Value Ref Range   Glucose-Capillary 159 (H) 70 - 99 mg/dL   Comment 1 Notify RN    Comment 2 Document in Chart   Urinalysis, Routine w reflex microscopic     Status: Abnormal   Collection Time: 06/29/18  6:36 PM  Result Value Ref Range   Color, Urine YELLOW YELLOW   APPearance CLEAR CLEAR   Specific Gravity, Urine 1.035 (H) 1.005 - 1.030   pH 6.0 5.0 - 8.0   Glucose, UA NEGATIVE NEGATIVE mg/dL   Hgb urine dipstick NEGATIVE NEGATIVE   Bilirubin Urine NEGATIVE NEGATIVE   Ketones, ur NEGATIVE NEGATIVE mg/dL   Protein, ur NEGATIVE NEGATIVE mg/dL   Nitrite NEGATIVE NEGATIVE   Leukocytes,Ua NEGATIVE  NEGATIVE    Comment: Performed at [facility] [address], [place], [place] [address]  Glucose, capillary     Status: Abnormal   Collection Time: 06/29/18  9:41 PM  Result Value Ref Range   Glucose-Capillary 172 (H) 70 - 99 mg/dL  Glucose, capillary     Status: Abnormal   Collection Time: 06/30/18  7:56 AM  Result Value Ref Range   Glucose-Capillary 199 (H) 70 - 99 mg/dL   Comment 1 Notify RN    Comment 2 Document in Chart   Glucose, capillary     Status: Abnormal   Collection Time: 06/30/18 11:49 AM  Result Value Ref Range   Glucose-Capillary 171 (H) 70 - 99 mg/dL   Comment 1 Notify RN    Comment 2 Document in Chart   Glucose, capillary     Status: Abnormal   Collection Time: 06/30/18  4:21 PM  Result Value Ref Range   Glucose-Capillary 172 (H) 70 - 99 mg/dL   Ct Angio Head W Or Wo Contrast  Result Date: 06/29/2018 CLINICAL DATA:  Left-sided deficit.  Right basal ganglia hemorrhage. EXAM: CT ANGIOGRAPHY HEAD TECHNIQUE: Multidetector CT imaging of the head was performed using the standard protocol during bolus administration of intravenous contrast. Multiplanar CT image reconstructions and MIPs were obtained to evaluate the vascular anatomy. CONTRAST:  61mL OMNIPAQUE IOHEXOL 350 MG/ML SOLN COMPARISON:  Head CT earlier same day FINDINGS: CTA HEAD Anterior circulation: Both internal carotid arteries are patent through the skull base and siphon regions. There is atherosclerotic calcification in the carotid siphon regions but no stenosis greater than 30%. The anterior and middle cerebral vessels are patent. There is mild atherosclerotic irregularity but no evidence of flow limiting stenosis or large/medium vessel occlusion. 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 This would not relate to the right basal ganglia hemorrhage. Posterior circulation: Both vertebral arteries are patent with the left being dominant. No basilar stenosis. Posterior  circulation branch vessels are patent, but show considerable atherosclerotic irregularity. Venous sinuses: Patent and normal. Anatomic variants: None significant. Delayed phase: No abnormal enhancement. IMPRESSION: No large or medium vessel occlusion. No flow limiting proximal stenosis. Diffuse atherosclerotic change of the more distal intracranial branch vessels. 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 This would not relate to the right basal ganglia intraparenchymal hemorrhage. Electronically Signed   By: Nelson Chimes M.D.   On: 06/29/2018 15:09   Chest 2 View  Result Date: 06/29/2018 CLINICAL DATA:  Intracerebral hemorrhage.  Left-sided weakness. EXAM: CHEST - 2 VIEW COMPARISON:  02/11/2016 FINDINGS: The heart size and mediastinal contours are within normal limits. Both lungs are clear. Several old midthoracic vertebral body compression deformities are again seen. IMPRESSION: No active cardiopulmonary disease. Electronically Signed   By: Earle Gell M.D.   On: 06/29/2018 15:03   [name] Contrast  Result Date: 06/30/2018 CLINICAL DATA:  Abnormal head CT EXAM: MRI HEAD WITHOUT CONTRAST TECHNIQUE: Multiplanar, multiecho pulse sequences of the brain and surrounding structures were obtained without intravenous contrast. COMPARISON:  Head CT and CTA from yesterday FINDINGS: Brain: T2 iso- and T1 mildly hyper-intense acute hematoma centered in the right putamen, measuring 16 mm. There is a rim of mild vasogenic edema. Diffusion signal extends superiorly towards the lateral ventricle that is blood products rather than neighboring infarct based on preceding CTA. No suspected underlying infarct. No evidence of mass. No evidence of amyloid angiopathy. There is mild chronic small vessel ischemic type change in the cerebral white matter. Vascular: Major flow voids are preserved, reference preceding CTA concerning and anterior communicating artery region aneurysm. Skull and upper cervical spine: Negative for  marrow lesion Sinuses/Orbits: Left maxillary sinusitis with central inspissated material and peripheral mucosal thickening. Is also left anterior ethmoid sinusitis. IMPRESSION: 1. Small hematoma in the right basal ganglia without evidence of underlying lesion. No evidence of amyloid angiopathy. Chronic small vessel disease is mild. 2. Obstructed left maxillary sinus. Electronically Signed   By: Monte Fantasia M.D.   On: 06/30/2018 05:59   Ct Head Code Stroke Wo Contrast  Result Date: 06/29/2018 CLINICAL DATA:  Code stroke.  Left arm and leg weakness. EXAM: CT HEAD WITHOUT CONTRAST TECHNIQUE: Contiguous axial images were obtained from the base of the skull through the vertex without intravenous contrast. COMPARISON:  None. FINDINGS: Brain: 15 mm high-density hematoma in the posterior right putamen and extending into the posterior limb internal capsule. No superimposed acute infarct is noted. Mild small vessel ischemic type change in the cerebral white matter. Remote lacunar infarct in the right caudate body. No hydrocephalus, collection, or masslike finding Vascular: Atherosclerotic calcification.  No hyperdense vessel Skull: Negative Sinuses/Orbits: Opacified left maxillary sinus with findings of mixed density secretions surrounded by low-density mucosal thickening. Other: Critical Value/emergent results were called by telephone at the time of interpretation on 06/29/2018 at 9:38 am to Dr. Nanda Quinton , who verbally acknowledged these results. ASPECTS [facility] Stroke Program Early CT Score) not scored in the setting of hemorrhage IMPRESSION: 1. 15 mm acute hematoma in the right basal ganglia/internal capsule. 2. Chronic small vessel disease. 3. Left maxillary sinusitis. Electronically Signed   By: Monte Fantasia M.D.   On: 06/29/2018 09:41       Medical Problem List and Plan: 1.  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  Admit to CIR 2.   Antithrombotics: -DVT/anticoagulation:  SCDs  -antiplatelet therapy: N/A 3. Pain Management:  Tylenol as needed 4. Mood:  Provide emotional support  -antipsychotic agents: N/A 5. Neuropsych: This patient is capable of making decisions on his own behalf. 6. Skin/Wound Care:  Routine skin checks 7. Fluids/Electrolytes/Nutrition:  Routine in and out's with follow-up chemistries in the AM. 8. Hypertension. Norvasc 10 mg daily, clonidine 0.1 mg daily, chlorthalidone 25 mg daily, Cozaar 50 mg daily. Monitor with increased mobility.  9. Diabetes mellitus. Hemoglobin A1c 6.8. SSI. Patient on Amaryl 4 mg daily and metformin 500 mg twice a day prior to  admission. Resume as needed. Monitor with increased mobility.  10. Hyperlipidemia. Lipitor  Post Admission Physician Evaluation: 1. Preadmission assessment reviewed and changes made below. 2. Functional deficits secondary  to right basal ganglia ICH. 3. Patient is admitted to receive collaborative, interdisciplinary care between the physiatrist, rehab nursing staff, and therapy team. 4. Patient's level of medical complexity and substantial therapy needs in context of that medical necessity cannot be provided at a lesser intensity of care such as a SNF. 5. Patient has experienced substantial functional loss from his/her baseline which was documented above under the "Functional History" and "Functional Status" headings.  Judging by the patient's diagnosis, physical exam, and functional history, the patient has potential for functional progress which will result in measurable gains while on inpatient rehab.  These gains will be of substantial and practical use upon discharge  in facilitating mobility and self-care at the household level. 41. Physiatrist will provide 24 hour management of medical needs as well as oversight of the therapy plan/treatment and provide guidance as appropriate regarding the interaction of the two. 7. 24 hour rehab nursing will assist  with safety, disease management and patient education  and help integrate therapy concepts, techniques,education, etc. 8. PT will assess and treat for/with: Lower extremity strength, range of motion, stamina, balance, functional mobility, safety, adaptive techniques and equipment, wound care, coping skills, pain control, education. Goals are: Supervision. 9. OT will assess and treat for/with: ADL's, functional mobility, safety, upper extremity strength, adaptive techniques and equipment, wound mgt, ego support, and community reintegration.   Goals are: Supervision. Therapy may proceed with showering this patient. 10. Case Management and Social Worker will assess and treat for psychological issues and discharge planning. 11. Team conference will be held weekly to assess progress toward goals and to determine barriers to discharge. 12. Patient will receive at least 3 hours of therapy per day at least 5 days per week. 13. ELOS: 12-16 days.       14. Prognosis:  good  I have personally performed a face to face diagnostic evaluation, including, but not limited to relevant history and physical exam findings, of this patient and developed relevant assessment and plan.  Additionally, I have reviewed and concur with the physician assistant's documentation above.  Delice Lesch, MD, ABPMR Lavon Paganini Angiulli, PA-C 07/01/2018

## 2018-07-01 NOTE — Discharge Summary (Addendum)
Stroke Discharge Summary  Patient ID: Peter York   MRN: [id number]      DOB: [birth year]  Date of Admission: 06/29/2018 Date of Discharge: 07/01/2018  Attending Physician:  Rosalin Hawking, MD, Stroke MD Consultant(s):   None Patient's PCP:  Chevis Pretty, FNP  Discharge Diagnoses:  Principal Problem:   ICH (intracerebral hemorrhage) ([place]) hypertensive R Basal ganglia bleed  Past Medical History:  Diagnosis Date  . Adenomatous colon polyp 12/31/2005  . Diabetes mellitus   . Hypercholesterolemia   . Hypertension    Past Surgical History:  Procedure Laterality Date  . ANAL FISTULECTOMY  10/02/2011   Procedure: FISTULECTOMY ANAL;  Surgeon: Joyice Faster. Cornett, MD;  Location: [facility];  Service: General;  Laterality: N/A;  excision perianal mass   . APPENDECTOMY    . COLONOSCOPY W/ POLYPECTOMY  12/31/2005   Dr. Silvano Rusk  . UMBILICAL HERNIA REPAIR      Medications to be continued on Rehab Allergies as of 07/01/2018      Reactions   Crestor [rosuvastatin Calcium] Other (See Comments)   "like to killed me." joint pain, unable to get OOB   Benazepril Other (See Comments)   hyperkalemia      Medication List    STOP taking these medications   aspirin EC 81 MG tablet     TAKE these medications   acetaminophen 650 MG CR tablet Commonly known as:  TYLENOL Take 650 mg by mouth daily as needed for pain.   alendronate 70 MG tablet Commonly known as:  FOSAMAX Take 1 tablet (70 mg total) by mouth every 7 (seven) days. Take with a full glass of water on an empty stomach.   amLODipine 10 MG tablet Commonly known as:  NORVASC Take 1 tablet (10 mg total) by mouth daily. What changed:  when to take this   atorvastatin 10 MG tablet Commonly known as:  LIPITOR Take 1 tablet (10 mg total) by mouth at bedtime.   calcium carbonate 500 MG chewable tablet Commonly known as:  TUMS - dosed in mg elemental calcium Chew 1 tablet (200 mg of elemental calcium  total) by mouth daily as needed for indigestion or heartburn.   CALCIUM-D PO Take 1 tablet by mouth at bedtime.   chlorthalidone 25 MG tablet Commonly known as:  HYGROTON Take 1 tablet (25 mg total) by mouth daily.   cholecalciferol 1000 units tablet Commonly known as:  VITAMIN D Take 1 tablet (1,000 Units total) by mouth at bedtime.   cloNIDine 0.1 MG tablet Commonly known as:  CATAPRES Take 1 tablet (0.1 mg total) by mouth daily.   colchicine 0.6 MG tablet Take 1-2 tablets (0.6-1.2 mg total) by mouth See admin instructions. Take 2 tablets (1.2 mg) by mouth at onset of gout, may take 1 additional tablet (0.6 mg) 2 hours later (once in 24 hours) What changed:  See the new instructions.   cycloSPORINE 0.05 % ophthalmic emulsion Commonly known as:  RESTASIS Place 1 drop into both eyes 2 (two) times daily as needed (dry eyes).   glimepiride 4 MG tablet Commonly known as:  AMARYL Take 1 tablet (4 mg total) by mouth daily with breakfast. What changed:  how much to take   glucose blood test strip USE TO CHECK BLOOD SUGAR ONCE A DAY AS INSTRUCTED   insulin aspart 100 UNIT/ML injection Commonly known as:  novoLOG Inject 0-15 Units into the skin 3 (three) times daily with meals.   losartan  25 MG tablet Commonly known as:  Cozaar Take 2 tablets (50 mg total) by mouth at bedtime.   metFORMIN 500 MG tablet Commonly known as:  GLUCOPHAGE Take 1 tablet (500 mg total) by mouth 2 (two) times daily with a meal. What changed:    how much to take  when to take this   OVER THE COUNTER MEDICATION Take 1 tablet by mouth 2 (two) times daily. Beta Prostrate supplement   senna-docusate 8.6-50 MG tablet Commonly known as:  Senokot-S Take 1 tablet by mouth 2 (two) times daily.   vitamin E 400 UNIT capsule Take 1 capsule (400 Units total) by mouth daily. What changed:  when to take this       LABORATORY STUDIES CBC    Component Value Date/Time   WBC 10.2 06/29/2018 1401   RBC  3.75 (L) 06/29/2018 1401   HGB 11.9 (L) 06/29/2018 1401   HGB WILL FOLLOW 03/17/2018 1512   HCT 33.8 (L) 06/29/2018 1401   HCT WILL FOLLOW 03/17/2018 1512   PLT 259 06/29/2018 1401   PLT WILL FOLLOW 03/17/2018 1512   MCV 90.1 06/29/2018 1401   MCV WILL FOLLOW 03/17/2018 1512   MCH 31.7 06/29/2018 1401   MCHC 35.2 06/29/2018 1401   RDW 12.8 06/29/2018 1401   RDW WILL FOLLOW 03/17/2018 1512   LYMPHSABS 1.1 06/29/2018 0925   LYMPHSABS WILL FOLLOW 03/17/2018 1512   MONOABS 0.4 06/29/2018 0925   EOSABS 0.5 06/29/2018 0925   EOSABS WILL FOLLOW 03/17/2018 1512   BASOSABS 0.1 06/29/2018 0925   BASOSABS WILL FOLLOW 03/17/2018 1512   CMP    Component Value Date/Time   NA 137 06/29/2018 1401   NA 138 03/17/2018 1512   K 3.9 06/29/2018 1401   CL 105 06/29/2018 1401   CO2 19 (L) 06/29/2018 1401   GLUCOSE 182 (H) 06/29/2018 1401   BUN 31 (H) 06/29/2018 1401   BUN 28 (H) 03/17/2018 1512   CREATININE 1.47 (H) 06/29/2018 1401   CREATININE 1.28 (H) 05/16/2015 0918   CALCIUM 9.5 06/29/2018 1401   PROT 7.5 06/29/2018 1401   PROT 6.8 03/17/2018 1512   ALBUMIN 4.3 06/29/2018 1401   ALBUMIN 4.3 03/17/2018 1512   AST 18 06/29/2018 1401   ALT 17 06/29/2018 1401   ALKPHOS 59 06/29/2018 1401   BILITOT 0.9 06/29/2018 1401   BILITOT 0.3 03/17/2018 1512   GFRNONAA 47 (L) 06/29/2018 1401   GFRNONAA 54 (L) 10/02/2012 0834   GFRAA 54 (L) 06/29/2018 1401   GFRAA 63 10/02/2012 0834   COAGS Lab Results  Component Value Date   INR 1.1 06/29/2018   INR 1.0 06/29/2018   Lipid Panel    Component Value Date/Time   CHOL 163 06/29/2018 1401   CHOL 170 03/17/2018 1512   CHOL 165 10/02/2012 0834   TRIG 177 (H) 06/29/2018 1401   TRIG 115 03/09/2014 0818   TRIG 95 10/02/2012 0834   HDL 31 (L) 06/29/2018 1401   HDL 32 (L) 03/17/2018 1512   HDL 43 03/09/2014 0818   HDL 51 10/02/2012 0834   CHOLHDL 5.3 06/29/2018 1401   VLDL 35 06/29/2018 1401   LDLCALC 97 06/29/2018 1401   LDLCALC 98 03/17/2018  1512   LDLCALC 223 (H) 09/10/2013 0905   LDLCALC 95 10/02/2012 0834   HgbA1C  Lab Results  Component Value Date   HGBA1C 6.8 (H) 06/29/2018   Urinalysis    Component Value Date/Time   COLORURINE YELLOW 06/29/2018 1836   APPEARANCEUR CLEAR 06/29/2018  1836   LABSPEC 1.035 (H) 06/29/2018 1836   PHURINE 6.0 06/29/2018 1836   GLUCOSEU NEGATIVE 06/29/2018 1836   HGBUR NEGATIVE 06/29/2018 1836   BILIRUBINUR NEGATIVE 06/29/2018 1836   KETONESUR NEGATIVE 06/29/2018 1836   PROTEINUR NEGATIVE 06/29/2018 1836   NITRITE NEGATIVE 06/29/2018 1836   LEUKOCYTESUR NEGATIVE 06/29/2018 1836   Urine Drug Screen     Component Value Date/Time   LABOPIA NONE DETECTED 06/29/2018 0925   COCAINSCRNUR NONE DETECTED 06/29/2018 0925   LABBENZ NONE DETECTED 06/29/2018 0925   AMPHETMU NONE DETECTED 06/29/2018 0925   THCU NONE DETECTED 06/29/2018 0925   LABBARB NONE DETECTED 06/29/2018 0925    Alcohol Level    Component Value Date/Time   ETH <10 06/29/2018 0925    SIGNIFICANT DIAGNOSTIC STUDIES Ct Angio Head W Or Wo Contrast  Result Date: 06/29/2018 CLINICAL DATA:  Left-sided deficit.  Right basal ganglia hemorrhage. EXAM: CT ANGIOGRAPHY HEAD TECHNIQUE: Multidetector CT imaging of the head was performed using the standard protocol during bolus administration of intravenous contrast. Multiplanar CT image reconstructions and MIPs were obtained to evaluate the vascular anatomy. CONTRAST:  74mL OMNIPAQUE IOHEXOL 350 MG/ML SOLN COMPARISON:  Head CT earlier same day FINDINGS: CTA HEAD Anterior circulation: Both internal carotid arteries are patent through the skull base and siphon regions. There is atherosclerotic calcification in the carotid siphon regions but no stenosis greater than 30%. The anterior and middle cerebral vessels are patent. There is mild atherosclerotic irregularity but no evidence of flow limiting stenosis or large/medium vessel occlusion. 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 This would not relate to the right basal ganglia hemorrhage. Posterior circulation: Both vertebral arteries are patent with the left being dominant. No basilar stenosis. Posterior circulation branch vessels are patent, but show considerable atherosclerotic irregularity. Venous sinuses: Patent and normal. Anatomic variants: None significant. Delayed phase: No abnormal enhancement. IMPRESSION: No large or medium vessel occlusion. No flow limiting proximal stenosis. Diffuse atherosclerotic change of the more distal intracranial branch vessels. 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 This would not relate to the right basal ganglia intraparenchymal hemorrhage. Electronically Signed   By: Nelson Chimes M.D.   On: 06/29/2018 15:09   Chest 2 View  Result Date: 06/29/2018 CLINICAL DATA:  Intracerebral hemorrhage.  Left-sided weakness. EXAM: CHEST - 2 VIEW COMPARISON:  02/11/2016 FINDINGS: The heart size and mediastinal contours are within normal limits. Both lungs are clear. Several old midthoracic vertebral body compression deformities are again seen. IMPRESSION: No active cardiopulmonary disease. Electronically Signed   By: Earle Gell M.D.   On: 06/29/2018 15:03   [name] Contrast  Result Date: 06/30/2018 CLINICAL DATA:  Abnormal head CT EXAM: MRI HEAD WITHOUT CONTRAST TECHNIQUE: Multiplanar, multiecho pulse sequences of the brain and surrounding structures were obtained without intravenous contrast. COMPARISON:  Head CT and CTA from yesterday FINDINGS: Brain: T2 iso- and T1 mildly hyper-intense acute hematoma centered in the right putamen, measuring 16 mm. There is a rim of mild vasogenic edema. Diffusion signal extends superiorly towards the lateral ventricle that is blood products rather than neighboring infarct based on preceding CTA. No suspected underlying infarct. No evidence of mass. No evidence of amyloid angiopathy. There is mild chronic small vessel ischemic  type change in the cerebral white matter. Vascular: Major flow voids are preserved, reference preceding CTA concerning and anterior communicating artery region aneurysm. Skull and upper cervical spine: Negative for marrow lesion Sinuses/Orbits: Left maxillary sinusitis with central inspissated  material and peripheral mucosal thickening. Is also left anterior ethmoid sinusitis. IMPRESSION: 1. Small hematoma in the right basal ganglia without evidence of underlying lesion. No evidence of amyloid angiopathy. Chronic small vessel disease is mild. 2. Obstructed left maxillary sinus. Electronically Signed   By: Monte Fantasia M.D.   On: 06/30/2018 05:59   Ct Head Code Stroke Wo Contrast  Result Date: 06/29/2018 CLINICAL DATA:  Code stroke.  Left arm and leg weakness. EXAM: CT HEAD WITHOUT CONTRAST TECHNIQUE: Contiguous axial images were obtained from the base of the skull through the vertex without intravenous contrast. COMPARISON:  None. FINDINGS: Brain: 15 mm high-density hematoma in the posterior right putamen and extending into the posterior limb internal capsule. No superimposed acute infarct is noted. Mild small vessel ischemic type change in the cerebral white matter. Remote lacunar infarct in the right caudate body. No hydrocephalus, collection, or masslike finding Vascular: Atherosclerotic calcification.  No hyperdense vessel Skull: Negative Sinuses/Orbits: Opacified left maxillary sinus with findings of mixed density secretions surrounded by low-density mucosal thickening. Other: Critical Value/emergent results were called by telephone at the time of interpretation on 06/29/2018 at 9:38 am to Dr. Nanda Quinton , who verbally acknowledged these results. ASPECTS [facility] Stroke Program Early CT Score) not scored in the setting of hemorrhage IMPRESSION: 1. 15 mm acute hematoma in the right basal ganglia/internal capsule. 2. Chronic small vessel disease. 3. Left maxillary sinusitis. Electronically Signed   By:  Monte Fantasia M.D.   On: 06/29/2018 09:41       HISTORY OF PRESENT ILLNESS AARNAV STEAGALL is a 73 y.o. male remote smoker, HTN, on ASA, is admitted at [facility] in transfer from the [facility] ED with a basal ganglia ICH. He states that he experienced acute L arm and leg weakness, and as a result he lost his balance, prompting neurological workup . CT Brain revealed a 15 mm acute hematoma in the right basal ganglia/internal capsule. Chronic small vessel disease.and left maxillary sinusitis were also noted. He admits to HA 3/10, left hand numbness, left side weakness. He denies CP, SOB, heart palpitations, or N/V. He denies uncontrolled blood pressure as he checks it daily and states that he takes his medications as prescribed. He was admitted to the neuro ICU for further evaluation and treatment.   HOSPITAL COURSE Mr. KATHERINE SYME is a 73 y.o. male with history of HTN, HLD, DB presenting with L sided weakness and L hand weakness. imaging shows R basal ganglia HTN hemorrhage.   ICH:  right basal ganglia ICH, likely secondary to HTN  Code Stroke CT head R BG/IC hmg, small vessel disease. L maxillary sinusitis  CXR NAD  CTA head no LVO. No stenosis. Diffuse atherosclerosis. 5.5 mm R ACom  MRI  Small R BG hmg w/o underlying lesion. Mild SVD. Obstructed L maxillary sinus  LDL 97  HgbA1c 6.8  UDS normal  On no antithrombotic prior to admission, now on No antithrombotic given hmg  Therapy recommendations:  CIR  Disposition:  CIR   Hypertension  BP as high as 170/82, now stable   Home meds: norvasc 10, clonidine 0.1, losartan 50, chlorthalidone 25  Treated with cardene on admission in the ICU  On norvasc 10, clonidine 0.1, losartan 50  Hyperlipidemia  Home meds:  lipitor 10, resumed in hospital  LDL 97, goal < 70  Continue statin at discharge  Diabetes type II Controlled  HgbA1c 6.8, goal < 7.0  Home meds: glimepiride 4, metformin 500  Home  meds resumed at d/c  along with SSI as needed  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 rate 18, height 5\' 8"  (1.727 m), weight 72 kg, SpO2 99 %. General - Well nourished, well developed, in no apparent distress.  Ophthalmologic - fundi not visualized due to noncooperation.  Cardiovascular - Regular rate and rhythm.  Mental Status -  Level of arousal and orientation to time, place, and person were intact. Language including expression, naming, repetition, comprehension was assessed and found intact.  Cranial Nerves II - XII - II - Visual field intact OU. III, IV, VI - Extraocular movements intact. V - Facial sensation intact bilaterally. VII - Facial movement intact bilaterally. VIII - Hearing & vestibular intact bilaterally. X - Palate elevates symmetrically. XI - Chin turning & shoulder shrug intact bilaterally. XII - Tongue protrusion intact.  Motor Strength - The patient's strength was normal in right upper and lower extremities.  However, left upper and lower extremity 4+/5 with mild pronator drift and left hand dexterity difficulty.  Bulk was normal and fasciculations were absent.   Motor Tone - Muscle tone was assessed at the neck and appendages and was normal.  Reflexes - The patient's reflexes were symmetrical in all extremities and he had no pathological reflexes.  Sensory - Light touch, temperature/pinprick were assessed and were symmetrical.    Coordination - The patient had mild left anger to nose dysmetria.  Tremor was absent.  Gait and Station - deferred.     Discharge Diet    Diet Order            Diet heart healthy/carb modified Room service appropriate? Yes with Assist; Fluid consistency: Thin  Diet effective now             liquids  DISCHARGE PLAN  Disposition:  Transfer to [place] for ongoing PT, OT and ST  Due to hemorrhage and risk of bleeding, do not take aspirin, aspirin-containing medications, or ibuprofen products  Follow up L ACom aneurysm with Dr. Estanislado Pandy  Recommend ongoing risk factor control by Primary Care Physician at time of discharge from inpatient rehabilitation.  Follow-up Chevis Pretty, FNP in 2 weeks following discharge from rehab.  Follow-up in [facility] in 4 weeks following discharge from rehab, office to schedule an appointment.   35 minutes were spent preparing discharge.  Rosalin Hawking, MD PhD Stroke Neurology 07/01/2018 7:31 PM

## 2018-07-01 NOTE — Progress Notes (Signed)
Inpatient Rehab Admissions Coordinator:   I have insurance authorization for rehab admission today.  I have spoken with Dr. Erlinda Hong and he is in agreement.  I will let pt, RN, CM, and CSW know.    Shann Medal, PT, DPT Admissions Coordinator [phone] 07/01/18  2:05 PM

## 2018-07-01 NOTE — H&P (Signed)
Physical Medicine and Rehabilitation Admission H&P    Chief Complaint  Patient presents with   Code Stroke  Chief complaint:left side weakness  HPI: Peter York is a 73 year old right-handed male history of hypertension maintained on Norvasc 10 mg daily, clonidine 0.1 mg daily, chlorthalidone 25 mg daily,Cozaar 50 mg daily diabetes mellitus maintained on Amaryl 4 mg daily and metformin 500 mg twice a day, hyperlipidemia.Per chart review and patient, patient lives with spouse. Independent and very active.One level home with 5 steps to entry. Wife is a Psychiatric nurse. They have a daughter that can also assist. Presented 06/29/2018 to [facility] with left side weakness and balance deficis. Blood pressure 170/82. CT of the head showed a 15 mm acute hematoma right basal ganglia internal capsule. CT angiogram of the head showed no large or medium vessel occlusion. 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 Patient was transferred to [facility] for further evaluation. Maintained on Cardene drip for blood pressure control. Follow-up MRI 06/30/2018 showed small hematoma right basal ganglia without evidence of underlying lesion. No evidence of amyloid angiopathy. Neurology follow-up noted blood pressure control Cardene drip later discontinued. Patient is tolerating a regular diet. Therapy evaluations completed with recommendations of physical medicine rehabilitation consult. Patient was admitted for a comprehensive rehabilitation program.  Review of Systems  Constitutional: Negative for chills and fever.  HENT: Negative for hearing loss.   Eyes: Negative for blurred vision and double vision.  Respiratory: Negative for cough and shortness of breath.   Cardiovascular: Negative for chest pain, palpitations and leg swelling.  Gastrointestinal: Positive for constipation. Negative for nausea and vomiting.  Genitourinary: Negative for dysuria, flank pain and hematuria.  Musculoskeletal: Positive for  joint pain and myalgias.  Skin: Negative for rash.  Neurological: Positive for focal weakness and weakness. Negative for speech change.       Poor balance as well as intermittent headaches  All other systems reviewed and are negative.  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 Cornett, MD;  Location: [facility];  Service: General;  Laterality: N/A;  excision perianal mass    APPENDECTOMY     COLONOSCOPY W/ POLYPECTOMY  12/31/2005   Dr. Silvano Rusk   UMBILICAL HERNIA REPAIR     Family History  Problem Relation Age of Onset   Colon cancer Father 37   Colon polyps Father    Cancer Father 34       Colon   Heart attack Maternal Uncle        X 4   Heart disease Maternal Uncle    Heart disease Maternal Uncle    Heart disease Maternal Uncle    Diabetes Daughter 42       Type 1   Transient ischemic attack Brother 80   Social History:  reports that he quit smoking about 40 years ago. His smoking use included cigarettes. He has a 11.25 pack-year smoking history. He has never used smokeless tobacco. He reports current alcohol use. He reports that he does not use drugs. Allergies:  Allergies  Allergen Reactions   Crestor [Rosuvastatin Calcium] Other (See Comments)    "like to killed me." joint pain, unable to get OOB   Benazepril Other (See Comments)  hyperkalemia   Medications Prior to Admission  Medication Sig Dispense Refill   acetaminophen (TYLENOL) 650 MG CR tablet Take 650 mg by mouth daily as needed for pain.     amLODipine (NORVASC) 10 MG tablet Take 1 tablet (10 mg total) by mouth daily. (Patient taking differently: Take 10 mg by mouth at bedtime. ) 90 tablet 3   aspirin EC 81 MG tablet Take 81 mg by mouth  at bedtime.     atorvastatin (LIPITOR) 10 MG tablet Take 1 tablet (10 mg total) by mouth daily. (Patient taking differently: Take 10 mg by mouth at bedtime. ) 90 tablet 3   Calcium Carbonate-Vitamin D (CALCIUM-D PO) Take 1 tablet by mouth at bedtime.     chlorthalidone (HYGROTON) 25 MG tablet Take 1 tablet (25 mg total) by mouth daily. 90 tablet 3   cholecalciferol (VITAMIN D) 1000 units tablet Take 1 tablet (1,000 Units total) by mouth daily. (Patient taking differently: Take 1,000 Units by mouth at bedtime. )     cloNIDine (CATAPRES) 0.1 MG tablet Take 1 tablet (0.1 mg total) by mouth daily. 180 tablet 1   colchicine 0.6 MG tablet TAKE 2 TABLETS BY MOUTH AT GOUT ONSET. MAY REPEAT 1 TABLET 2 HOURS LATER (ONCE IN 24 HOURS) (Patient taking differently: Take 0.6-1.2 mg by mouth See admin instructions. Take 2 tablets (1.2 mg) by mouth at onset of gout, may take 1 additional tablet (0.6 mg) 2 hours later (once in 24 hours)) 90 tablet 0   cycloSPORINE (RESTASIS) 0.05 % ophthalmic emulsion Place 1 drop into both eyes 2 (two) times daily as needed (dry eyes).     glimepiride (AMARYL) 4 MG tablet Take 1 tablet (4 mg total) by mouth daily with breakfast. (Patient taking differently: Take 2-4 mg by mouth daily with breakfast. ) 90 tablet 0   losartan (COZAAR) 25 MG tablet Take 2 tablets (50 mg total) by mouth daily. (Patient taking differently: Take 50 mg by mouth at bedtime. ) 180 tablet 1   metFORMIN (GLUCOPHAGE) 500 MG tablet Take 1 tablet (500 mg total) by mouth 2 (two) times daily with a meal. (Patient taking differently: Take 1,000 mg by mouth at bedtime. ) 180 tablet 1   OVER THE COUNTER MEDICATION Take 1 tablet by mouth 2 (two) times daily. Beta Prostrate supplement     vitamin E 400 UNIT capsule Take 1 capsule (400 Units total) by mouth daily. (Patient taking differently: Take 400 Units by mouth at bedtime. ) 30 capsule 3   alendronate (FOSAMAX) 70 MG tablet Take 1 tablet (70 mg total) by  mouth every 7 (seven) days. Take with a full glass of water on an empty stomach. (Patient not taking: Reported on 06/29/2018) 12 tablet 2   glucose blood test strip USE TO CHECK BLOOD SUGAR ONCE A DAY AS INSTRUCTED 100 each 8    Drug Regimen Review Drug regimen was reviewed and remains appropriate with no significant issues identified  Home: Home Living Family/patient expects to be discharged to:: Private residence Living Arrangements: Spouse/significant other, Children Available Help at Discharge: Family, Available PRN/intermittently(wife is a Psychiatric nurse) Type of Home: House Home Access: Stairs to enter [employer] of Steps: 5(in the front, 10 in the back) Entrance Stairs-Rails: Can reach both Home Layout: One level Bathroom Shower/Tub: Multimedia programmer: Standard Home Equipment: Environmental consultant - 2 wheels, Shower seat, Radio producer - single point  Lives With: Spouse   Functional History: Prior Function Level of Independence: Independent Comments: rides horses and  trains bird dogs, rides horses to keep up with the dogs  Functional Status:  Mobility: Bed Mobility Overal bed mobility: Needs Assistance Bed Mobility: Supine to Sit Supine to sit: Min guard General bed mobility comments: Min Guard A for safety Transfers Overall transfer level: Needs assistance Equipment used: None Transfers: Sit to/from Stand Sit to Stand: Min assist General transfer comment: minA due to slight impulsivity, pt very quick Ambulation/Gait Ambulation/Gait assistance: Min assist, Mod assist Gait Distance (Feet): 120 Feet Assistive device: Rolling walker (2 wheeled), 1 person hand held assist Gait Pattern/deviations: Step-through pattern, Decreased stride length, Decreased dorsiflexion - left General Gait Details: pt very quick to move stating "i'm a fast walker"  pt with noted difficulty clearing L foot and tripping, attempted L HHA however unsuccessful, pt given a RW, pt with increased UE  dependence, verbal cues and minA for walker management as pt pushing too far out in front, with onset of fatigue pt with increased freq of inability to clear L foot and trip over it requiring min/modA to prevent fall despite having RW. max verbal cues to slow down Gait velocity: impulsively fast Gait velocity interpretation: <1.8 ft/sec, indicate of risk for recurrent falls    ADL: ADL Overall ADL's : Needs assistance/impaired Eating/Feeding: Independent, Sitting Grooming: Oral care, Minimal assistance, Standing Grooming Details (indicate cue type and reason): Pt with decreased bilateraly cooridnation, poor grasp with L hand, and coorindation at LUE. Pt also moving quickly and requiring cues to slow down. Mi nA for facilitate normalized movement at LUE. Min A for standing balance Upper Body Bathing: Minimal assistance, Sitting Lower Body Bathing: Minimal assistance, Sit to/from stand Upper Body Dressing : Minimal assistance, Sitting Lower Body Dressing: Minimal assistance, Sit to/from stand Toilet Transfer: Minimal assistance, Ambulation, RW(simulated to recliner) Functional mobility during ADLs: Minimal assistance, Rolling walker General ADL Comments: Pt with decreased coorindation at LUE, poor safety awarnees, and impusivity impacting his functional performance. Pt highly motivated  Cognition: Cognition Overall Cognitive Status: Impaired/Different from baseline Arousal/Alertness: Awake/alert Orientation Level: Oriented X4 Attention: Focused, Sustained Focused Attention: Appears intact(Vigilance WNL: 1/1) Sustained Attention: Appears intact(Serial 7s: 3/3) Memory: Appears intact Awareness: Appears intact(Immediate: 5/5; delayed: 5/5) Problem Solving: Appears intact Executive Function: Reasoning, Sequencing Reasoning: Appears intact(Abstraction: 2/2) Sequencing: Appears intact(Clock drawing: 3/3) Cognition Arousal/Alertness: Awake/alert Behavior During Therapy: WFL for tasks  assessed/performed, Impulsive Overall Cognitive Status: Impaired/Different from baseline Area of Impairment: Safety/judgement Safety/Judgement: Decreased awareness of safety General Comments: pt mildly impulsive however pt moves fast at baseline. pt aware of deficits however has decreased insight to safety  Physical Exam: Blood pressure 123/70, pulse 69, temperature 98.8 F (37.1 C), temperature source Oral, resp. rate 17, height 5\' 8"  (1.727 m), weight 72 kg, SpO2 96 %. Physical Exam  Vitals reviewed. Constitutional: He is oriented to person, place, and time. He appears well-developed and well-nourished.  HENT:  Head: Normocephalic and atraumatic.  Eyes: EOM are normal. Right eye exhibits no discharge. Left eye exhibits no discharge.  Respiratory: Effort normal.  GI: He exhibits no distension.  Musculoskeletal:     Comments: No edema or tenderness in lower extremities  Neurological: He is alert and oriented to person, place, and time.  Follows full commands.  Speech is fluent. Motor: RLE: 5/5 proximal to distal LUE/LLE: 4-/5 proximal to distal  Skin: Skin is warm and dry.  Psychiatric: He has a normal mood and affect. His behavior is normal.    Results for orders placed or performed during the hospital encounter of 06/29/18 (from  the past 48 hour(s))  Ethanol     Status: None   Collection Time: 06/29/18  9:25 AM  Result Value Ref Range   Alcohol, Ethyl (B) <10 <10 mg/dL    Comment: (NOTE) Lowest detectable limit for serum alcohol is 10 mg/dL. For medical purposes only. Performed at [facility], [address] [place], [place] [address]   Protime-INR     Status: None   Collection Time: 06/29/18  9:25 AM  Result Value Ref Range   Prothrombin Time 13.2 11.4 - 15.2 seconds   INR 1.0 0.8 - 1.2    Comment: (NOTE) INR goal varies based on device and disease states. Performed at [facility], [address] [place], [place] [address]   APTT     Status: None   Collection  Time: 06/29/18  9:25 AM  Result Value Ref Range   aPTT 29 24 - 36 seconds    Comment: Performed at [facility], [address], [place], [place] [address]  CBC     Status: Abnormal   Collection Time: 06/29/18  9:25 AM  Result Value Ref Range   WBC 6.2 4.0 - 10.5 K/uL   RBC 3.81 (L) 4.22 - 5.81 MIL/uL   Hemoglobin 12.1 (L) 13.0 - 17.0 g/dL   HCT 35.5 (L) 39.0 - 52.0 %   MCV 93.2 80.0 - 100.0 fL   MCH 31.8 26.0 - 34.0 pg   MCHC 34.1 30.0 - 36.0 g/dL   RDW 13.0 11.5 - 15.5 %   Platelets 258 150 - 400 K/uL   nRBC 0.0 0.0 - 0.2 %    Comment: Performed at [facility], [address], [place], [place] [address]  Differential     Status: None   Collection Time: 06/29/18  9:25 AM  Result Value Ref Range   Neutrophils Relative % 67 %   Neutro Abs 4.1 1.7 - 7.7 K/uL   Lymphocytes Relative 18 %   Lymphs Abs 1.1 0.7 - 4.0 K/uL   Monocytes Relative 6 %   Monocytes Absolute 0.4 0.1 - 1.0 K/uL   Eosinophils Relative 7 %   Eosinophils Absolute 0.5 0.0 - 0.5 K/uL   Basophils Relative 1 %   Basophils Absolute 0.1 0.0 - 0.1 K/uL   Immature Granulocytes 1 %   Abs Immature Granulocytes 0.03 0.00 - 0.07 K/uL    Comment: Performed at [facility], [address], [place], [place] [address]  Comprehensive metabolic panel     Status: Abnormal   Collection Time: 06/29/18  9:25 AM  Result Value Ref Range   Sodium 136 135 - 145 mmol/L   Potassium 4.5 3.5 - 5.1 mmol/L   Chloride 105 98 - 111 mmol/L   CO2 22 22 - 32 mmol/L   Glucose, Bld 158 (H) 70 - 99 mg/dL   BUN 36 (H) 8 - 23 mg/dL   Creatinine, Ser 1.55 (H) 0.61 - 1.24 mg/dL   Calcium 9.4 8.9 - 10.3 mg/dL   Total Protein 8.1 6.5 - 8.1 g/dL   Albumin 4.6 3.5 - 5.0 g/dL   AST 19 15 - 41 U/L   ALT 17 0 - 44 U/L   Alkaline Phosphatase 65 38 - 126 U/L   Total Bilirubin 0.7 0.3 - 1.2 mg/dL   GFR calc non Af Amer 44 (L) >60 mL/min   GFR calc Af Amer 51 (L) >60 mL/min   Anion gap 9 5 - 15    Comment: Performed at [facility], [address],  [place], [place] [address]  Urine rapid drug screen (hosp performed)     Status: None   Collection Time: 06/29/18  9:25 AM  Result Value Ref Range   Opiates NONE DETECTED NONE DETECTED   Cocaine NONE DETECTED NONE DETECTED   Benzodiazepines NONE DETECTED NONE DETECTED   Amphetamines NONE DETECTED NONE DETECTED   Tetrahydrocannabinol NONE DETECTED NONE DETECTED   Barbiturates NONE DETECTED NONE DETECTED    Comment: (NOTE) DRUG SCREEN FOR MEDICAL PURPOSES ONLY.  IF CONFIRMATION IS NEEDED FOR ANY PURPOSE, NOTIFY LAB WITHIN 5 DAYS. LOWEST DETECTABLE LIMITS FOR URINE DRUG SCREEN Drug Class                     Cutoff (ng/mL) Amphetamine and metabolites    1000 Barbiturate and metabolites    200 Benzodiazepine                 962 Tricyclics and metabolites     300 Opiates and metabolites        300 Cocaine and metabolites        300 THC                            50 Performed at [facility] [place], [place] [address]   Urinalysis, Routine w reflex microscopic     Status: None   Collection Time: 06/29/18  9:25 AM  Result Value Ref Range   Color, Urine YELLOW YELLOW   APPearance CLEAR CLEAR   Specific Gravity, Urine 1.015 1.005 - 1.030   pH 7.0 5.0 - 8.0   Glucose, UA NEGATIVE NEGATIVE mg/dL   Hgb urine dipstick NEGATIVE NEGATIVE   Bilirubin Urine NEGATIVE NEGATIVE   Ketones, ur NEGATIVE NEGATIVE mg/dL   Protein, ur NEGATIVE NEGATIVE mg/dL   Nitrite NEGATIVE NEGATIVE   Leukocytes,Ua NEGATIVE NEGATIVE    Comment: Performed at [facility], [address], [place], [place] [address]  MRSA PCR Screening     Status: None   Collection Time: 06/29/18  1:21 PM  Result Value Ref Range   MRSA by PCR NEGATIVE NEGATIVE    Comment:        The GeneXpert MRSA Assay (FDA approved for NASAL specimens only), is one component of a comprehensive MRSA colonization surveillance program. It is not intended to diagnose MRSA infection nor to guide or monitor treatment for MRSA  infections. Performed at [facility], [place] [address] [place], [place] [address]   CBC     Status: Abnormal   Collection Time: 06/29/18  2:01 PM  Result Value Ref Range   WBC 10.2 4.0 - 10.5 K/uL   RBC 3.75 (L) 4.22 - 5.81 MIL/uL   Hemoglobin 11.9 (L) 13.0 - 17.0 g/dL   HCT 33.8 (L) 39.0 - 52.0 %   MCV 90.1 80.0 - 100.0 fL   MCH 31.7 26.0 - 34.0 pg   MCHC 35.2 30.0 - 36.0 g/dL   RDW 12.8 11.5 - 15.5 %   Platelets 259 150 - 400 K/uL   nRBC 0.0 0.0 - 0.2 %    Comment: Performed at [facility], [place] [address], [place], [place] [address]  Comprehensive metabolic panel     Status: Abnormal   Collection Time: 06/29/18  2:01 PM  Result Value Ref Range   Sodium 137 135 - 145 mmol/L   Potassium 3.9 3.5 - 5.1 mmol/L   Chloride 105  98 - 111 mmol/L   CO2 19 (L) 22 - 32 mmol/L   Glucose, Bld 182 (H) 70 - 99 mg/dL   BUN 31 (H) 8 - 23 mg/dL   Creatinine, Ser 1.47 (H) 0.61 - 1.24 mg/dL   Calcium 9.5 8.9 - 10.3 mg/dL   Total Protein 7.5 6.5 - 8.1 g/dL   Albumin 4.3 3.5 - 5.0 g/dL   AST 18 15 - 41 U/L   ALT 17 0 - 44 U/L   Alkaline Phosphatase 59 38 - 126 U/L   Total Bilirubin 0.9 0.3 - 1.2 mg/dL   GFR calc non Af Amer 47 (L) >60 mL/min   GFR calc Af Amer 54 (L) >60 mL/min   Anion gap 13 5 - 15    Comment: Performed at [facility] [address], [place], [place] [address]  APTT     Status: None   Collection Time: 06/29/18  2:01 PM  Result Value Ref Range   aPTT 30 24 - 36 seconds    Comment: Performed at [facility], [place] [address], [place], [place] [address]  Protime-INR     Status: None   Collection Time: 06/29/18  2:01 PM  Result Value Ref Range   Prothrombin Time 14.3 11.4 - 15.2 seconds   INR 1.1 0.8 - 1.2    Comment: (NOTE) INR goal varies based on device and disease states. Performed at [facility], [place] [address], [place], [place] [address]   Hemoglobin A1c     Status: Abnormal   Collection Time: 06/29/18  2:01 PM  Result Value Ref  Range   Hgb A1c MFr Bld 6.8 (H) 4.8 - 5.6 %    Comment: (NOTE) Pre diabetes:          5.7%-6.4% Diabetes:              >6.4% Glycemic control for   <7.0% adults with diabetes    Mean Plasma Glucose 148.46 mg/dL    Comment: Performed at [facility] [address], [place], [place] [address]  Lipid panel     Status: Abnormal   Collection Time: 06/29/18  2:01 PM  Result Value Ref Range   Cholesterol 163 0 - 200 mg/dL   Triglycerides 177 (H) <150 mg/dL   HDL 31 (L) >40 mg/dL   Total CHOL/HDL Ratio 5.3 RATIO   VLDL 35 0 - 40 mg/dL   LDL Cholesterol 97 0 - 99 mg/dL    Comment:        Total Cholesterol/HDL:CHD Risk Coronary Heart Disease Risk Table                     Men   Women  1/2 Average Risk   3.4   3.3  Average Risk       5.0   4.4  2 X Average Risk   9.6   7.1  3 X Average Risk  23.4   11.0        Use the calculated Patient Ratio above and the CHD Risk Table to determine the patient's CHD Risk.        ATP III CLASSIFICATION (LDL):  <100     mg/dL   Optimal  100-129  mg/dL   Near or Above                    Optimal  130-159  mg/dL   Borderline  160-189  mg/dL  High  >190     mg/dL   Very High Performed at [facility] [address] [place], [place] [address]   Glucose, capillary     Status: Abnormal   Collection Time: 06/29/18  6:08 PM  Result Value Ref Range   Glucose-Capillary 159 (H) 70 - 99 mg/dL   Comment 1 Notify RN    Comment 2 Document in Chart   Urinalysis, Routine w reflex microscopic     Status: Abnormal   Collection Time: 06/29/18  6:36 PM  Result Value Ref Range   Color, Urine YELLOW YELLOW   APPearance CLEAR CLEAR   Specific Gravity, Urine 1.035 (H) 1.005 - 1.030   pH 6.0 5.0 - 8.0   Glucose, UA NEGATIVE NEGATIVE mg/dL   Hgb urine dipstick NEGATIVE NEGATIVE   Bilirubin Urine NEGATIVE NEGATIVE   Ketones, ur NEGATIVE NEGATIVE mg/dL   Protein, ur NEGATIVE NEGATIVE mg/dL   Nitrite NEGATIVE NEGATIVE   Leukocytes,Ua NEGATIVE  NEGATIVE    Comment: Performed at [facility] [address] [place], [place] [address]  Glucose, capillary     Status: Abnormal   Collection Time: 06/29/18  9:41 PM  Result Value Ref Range   Glucose-Capillary 172 (H) 70 - 99 mg/dL  Glucose, capillary     Status: Abnormal   Collection Time: 06/30/18  7:56 AM  Result Value Ref Range   Glucose-Capillary 199 (H) 70 - 99 mg/dL   Comment 1 Notify RN    Comment 2 Document in Chart   Glucose, capillary     Status: Abnormal   Collection Time: 06/30/18 11:49 AM  Result Value Ref Range   Glucose-Capillary 171 (H) 70 - 99 mg/dL   Comment 1 Notify RN    Comment 2 Document in Chart   Glucose, capillary     Status: Abnormal   Collection Time: 06/30/18  4:21 PM  Result Value Ref Range   Glucose-Capillary 172 (H) 70 - 99 mg/dL   Ct Angio Head W Or Wo Contrast  Result Date: 06/29/2018 CLINICAL DATA:  Left-sided deficit.  Right basal ganglia hemorrhage. EXAM: CT ANGIOGRAPHY HEAD TECHNIQUE: Multidetector CT imaging of the head was performed using the standard protocol during bolus administration of intravenous contrast. Multiplanar CT image reconstructions and MIPs were obtained to evaluate the vascular anatomy. CONTRAST:  62mL OMNIPAQUE IOHEXOL 350 MG/ML SOLN COMPARISON:  Head CT earlier same day FINDINGS: CTA HEAD Anterior circulation: Both internal carotid arteries are patent through the skull base and siphon regions. There is atherosclerotic calcification in the carotid siphon regions but no stenosis greater than 30%. The anterior and middle cerebral vessels are patent. There is mild atherosclerotic irregularity but no evidence of flow limiting stenosis or large/medium vessel occlusion. 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 This would not relate to the right basal ganglia hemorrhage. Posterior circulation: Both vertebral arteries are patent with the left being dominant. No basilar stenosis. Posterior  circulation branch vessels are patent, but show considerable atherosclerotic irregularity. Venous sinuses: Patent and normal. Anatomic variants: None significant. Delayed phase: No abnormal enhancement. IMPRESSION: No large or medium vessel occlusion. No flow limiting proximal stenosis. Diffuse atherosclerotic change of the more distal intracranial branch vessels. 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 This would not relate to the right basal ganglia intraparenchymal hemorrhage. Electronically Signed   By: Nelson Chimes M.D.   On: 06/29/2018 15:09   Chest 2 View  Result Date: 06/29/2018 CLINICAL DATA:  Intracerebral hemorrhage.  Left-sided weakness. EXAM: CHEST - 2 VIEW COMPARISON:  02/11/2016 FINDINGS: The heart size and mediastinal contours are within normal limits. Both lungs are clear. Several old midthoracic vertebral body compression deformities are again seen. IMPRESSION: No active cardiopulmonary disease. Electronically Signed   By: Earle Gell M.D.   On: 06/29/2018 15:03   [name] Contrast  Result Date: 06/30/2018 CLINICAL DATA:  Abnormal head CT EXAM: MRI HEAD WITHOUT CONTRAST TECHNIQUE: Multiplanar, multiecho pulse sequences of the brain and surrounding structures were obtained without intravenous contrast. COMPARISON:  Head CT and CTA from yesterday FINDINGS: Brain: T2 iso- and T1 mildly hyper-intense acute hematoma centered in the right putamen, measuring 16 mm. There is a rim of mild vasogenic edema. Diffusion signal extends superiorly towards the lateral ventricle that is blood products rather than neighboring infarct based on preceding CTA. No suspected underlying infarct. No evidence of mass. No evidence of amyloid angiopathy. There is mild chronic small vessel ischemic type change in the cerebral white matter. Vascular: Major flow voids are preserved, reference preceding CTA concerning and anterior communicating artery region aneurysm. Skull and upper cervical spine: Negative for  marrow lesion Sinuses/Orbits: Left maxillary sinusitis with central inspissated material and peripheral mucosal thickening. Is also left anterior ethmoid sinusitis. IMPRESSION: 1. Small hematoma in the right basal ganglia without evidence of underlying lesion. No evidence of amyloid angiopathy. Chronic small vessel disease is mild. 2. Obstructed left maxillary sinus. Electronically Signed   By: Monte Fantasia M.D.   On: 06/30/2018 05:59   Ct Head Code Stroke Wo Contrast  Result Date: 06/29/2018 CLINICAL DATA:  Code stroke.  Left arm and leg weakness. EXAM: CT HEAD WITHOUT CONTRAST TECHNIQUE: Contiguous axial images were obtained from the base of the skull through the vertex without intravenous contrast. COMPARISON:  None. FINDINGS: Brain: 15 mm high-density hematoma in the posterior right putamen and extending into the posterior limb internal capsule. No superimposed acute infarct is noted. Mild small vessel ischemic type change in the cerebral white matter. Remote lacunar infarct in the right caudate body. No hydrocephalus, collection, or masslike finding Vascular: Atherosclerotic calcification.  No hyperdense vessel Skull: Negative Sinuses/Orbits: Opacified left maxillary sinus with findings of mixed density secretions surrounded by low-density mucosal thickening. Other: Critical Value/emergent results were called by telephone at the time of interpretation on 06/29/2018 at 9:38 am to Dr. Nanda Quinton , who verbally acknowledged these results. ASPECTS [facility] Stroke Program Early CT Score) not scored in the setting of hemorrhage IMPRESSION: 1. 15 mm acute hematoma in the right basal ganglia/internal capsule. 2. Chronic small vessel disease. 3. Left maxillary sinusitis. Electronically Signed   By: Monte Fantasia M.D.   On: 06/29/2018 09:41       Medical Problem List and Plan: 1.  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   Antithrombotics: -DVT/anticoagulation:  SCDs  -antiplatelet therapy: N/A 3. Pain Management:  Tylenol as needed 4. Mood:  Provide emotional support  -antipsychotic agents: N/A 5. Neuropsych: This patient is capable of making decisions on his own behalf. 6. Skin/Wound Care:  Routine skin checks 7. Fluids/Electrolytes/Nutrition:  Routine in and out's with follow-up chemistries in the AM. 8. Hypertension. Norvasc 10 mg daily, clonidine 0.1 mg daily, chlorthalidone 25 mg daily, Cozaar 50 mg daily. Monitor with increased mobility.  9. Diabetes mellitus. Hemoglobin A1c 6.8. SSI. Patient on Amaryl 4 mg daily and metformin 500 mg twice a day prior to  admission. Resume as needed. Monitor with increased mobility.  10. Hyperlipidemia. Lipitor  Cathlyn Parsons, PA-C 07/01/2018

## 2018-07-01 NOTE — PMR Pre-admission (Signed)
PMR Admission Coordinator Pre-Admission Assessment  Patient: Peter York is an 73 y.o., male MRN: [id number] DOB: 1945-05-15 Height: 5' 8"  (172.7 cm) Weight: 72 kg  Insurance Information HMO:     PPO: yes     PCP:      IPA:      80/20:      OTHER:  PRIMARY: [facility] [place] Policy#: [id number]      Subscriber: PATIENT CM Name: Peter York      Phone#: [phone]     Fax#: [phone] Pre-Cert#: [id number]      Employer:  Benefits:  Phone #: [phone]     Name:  Peter York. Date: 03/26/2018     Deduct: $0      Out of Pocket Max: $3,400 (met $0)      Life Max: n/a CIR: $295/day for days 1-6, $0/day remaining days SNF:  $20/day for days 1-20, $160/day for days 21-100, limited to 100 days Outpatient:      Co-Pay: $15/visit limited to medical necessity Home Health: 100%      Co-Pay: $0 *limited to medical necessity DME: 80%     Co-Pay: 20%  SECONDARY:       Policy#:       Subscriber:  CM Name:       Phone#:      Fax#:  Pre-Cert#:       Employer:  Benefits:  Phone #:      Name:  Eff. Date:      Deduct:       Out of Pocket Max:       Life Max:  CIR:       SNF:  Outpatient:      Co-Pay:  Home Health:       Co-Pay:  DME:      Co-Pay:   Medicaid Application Date:       Case Manager:  Disability Application Date:       Case Worker:   The "Data Collection Information Summary" for patients in Inpatient Rehabilitation Facilities with attached "Privacy Act [place] Records" was provided and verbally reviewed with: Patient  Emergency Contact Information Contact Information    Name Relation Home Work Mobile   Peter York,Peter York Spouse [phone] Peter York [phone]  [phone]      Current Medical History  Patient Admitting Diagnosis: R basal ganglia ICH 2/2 hypertension  History of Present Illness: SIRUS LABRIE is a 73 year old right-handed male history of hypertension maintained on Norvasc 10 mg daily, clonidine  0.1 mg daily, chlorthalidone 25 mg daily,Cozaar 50 mg daily diabetes mellitus maintained on Amaryl 4 mg daily and metformin 500 mg twice a day, hyperlipidemia.  Presented 06/28/2020 to [facility] with left side weakness and impaired balance. Blood pressure 170/82. CT of the head showed a 15 mm acute hematoma right basal ganglia internal capsule. CT angiogram of the head showed no large or medium vessel occlusion. 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 Patient was transferred to [facility] for [facility] evaluation. Maintained on Cardene drip for blood pressure control. Follow-up MRI 06/30/2018 showed small hematoma right basal ganglia without evidence of underlying lesion. No evidence of amyloid angiopathy. Neurology follow-up noted blood pressure control, Cardene drip later discontinued. Patient is tolerating a regular diet.   Complete NIHSS TOTAL: 1  Patient's medical record from [facility] has been reviewed by the rehabilitation admission coordinator and physician.  Past Medical History  Past Medical History:  Diagnosis Date  . Adenomatous colon polyp 12/31/2005  . Diabetes mellitus   . Hypercholesterolemia   . Hypertension     Family History   family history includes Cancer (age of onset: 49) in his father; Colon cancer (age of onset: 61) in his father; Colon polyps in his father; Diabetes (age of onset: 80) in his [place]; Heart attack in his maternal uncle; Heart disease in his maternal uncle, maternal uncle, and maternal uncle; Transient ischemic attack (age of onset: 48) in his brother.  Prior Rehab/Hospitalizations Has the patient had prior rehab or hospitalizations prior to admission? No  Has the patient had major surgery during 100 days prior to admission? No   Current Medications  Current Facility-Administered Medications:  .  acetaminophen (TYLENOL) tablet 650 mg, 650 mg, Oral, Q4H PRN, 650 mg at 07/01/18 0608 **OR**  acetaminophen (TYLENOL) solution 650 mg, 650 mg, Per Tube, Q4H PRN **OR** acetaminophen (TYLENOL) suppository 650 mg, 650 mg, Rectal, Q4H PRN, Kerney Elbe, MD .  amLODipine (NORVASC) tablet 10 mg, 10 mg, Oral, Daily, Kerney Elbe, MD, 10 mg at 06/30/18 2203 .  atorvastatin (LIPITOR) tablet 10 mg, 10 mg, Oral, Daily, Kerney Elbe, MD, 10 mg at 06/30/18 2203 .  calcium carbonate (TUMS - dosed in mg elemental calcium) chewable tablet 200 mg of elemental calcium, 1 tablet, Oral, Daily PRN, Kerney Elbe, MD, 200 mg of elemental calcium at 06/29/18 1734 .  chlorthalidone (HYGROTON) tablet 25 mg, 25 mg, Oral, Daily, Kerney Elbe, MD, 25 mg at 07/01/18 0957 .  cloNIDine (CATAPRES) tablet 0.1 mg, 0.1 mg, Oral, Daily, Kerney Elbe, MD, 0.1 mg at 07/01/18 0957 .  insulin aspart (novoLOG) injection 0-15 Units, 0-15 Units, Subcutaneous, TID WC, Kerney Elbe, MD, 3 Units at 07/01/18 [phone] .  labetalol (NORMODYNE,TRANDATE) injection 5-10 mg, 5-10 mg, Intravenous, Q2H PRN, Rosalin Hawking, MD .  losartan (COZAAR) tablet 50 mg, 50 mg, Oral, Daily, Kerney Elbe, MD, 50 mg at 07/01/18 0957 .  pantoprazole (PROTONIX) EC tablet 40 mg, 40 mg, Oral, QHS, Kerney Elbe, MD, 40 mg at 06/30/18 2203 .  senna-docusate (Senokot-S) tablet 1 tablet, 1 tablet, Oral, BID, Kerney Elbe, MD, 1 tablet at 07/01/18 0957 .  vitamin E capsule 400 Units, 400 Units, Oral, Daily, Kerney Elbe, MD, 400 Units at 06/30/18 2203  Patients Current Diet:  Diet Order            Diet heart healthy/carb modified Room service appropriate? Yes with Assist; Fluid consistency: Thin  Diet effective now              Precautions / Restrictions Precautions Precautions: Fall Restrictions Weight Bearing Restrictions: No   Has the patient had 2 or more falls or a fall with injury in the past year? No  Prior Activity Level Community (5-7x/wk): Pt very active, still driving, riding horses, and training hunting dogs  Prior Functional  Level Self Care: Did the patient need help bathing, dressing, using the toilet or eating? Independent  Indoor Mobility: Did the patient need assistance with walking from room to room (with or without device)? Independent  Stairs: Did the patient need assistance with internal or external stairs (with or without device)? Independent  Functional Cognition: Did the patient need help planning regular tasks such as shopping or remembering to take medications? [place] Devices/Equipment: Cane (specify quad or straight), Walker (specify type) Home Equipment: Walker - 2 wheels, Shower seat, Cane - single point  Prior Device Use: Indicate devices/aids  used by the patient prior to current illness, exacerbation or injury? None of the above  Current Functional Level Cognition  Arousal/Alertness: Awake/alert Overall Cognitive Status: Impaired/Different from baseline Orientation Level: Oriented X4 Safety/Judgement: Decreased awareness of safety General Comments: pt mildly impulsive however pt moves fast at baseline. pt aware of deficits however has decreased insight to safety Attention: Focused, Sustained Focused Attention: Appears intact(Vigilance WNL: 1/1) Sustained Attention: Appears intact(Serial 7s: 3/3) Memory: Appears intact Awareness: Appears intact(Immediate: 5/5; delayed: 5/5) Problem Solving: Appears intact Executive Function: Reasoning, Sequencing Reasoning: Appears intact(Abstraction: 2/2) Sequencing: Appears intact(Clock drawing: 3/3)    Extremity Assessment (includes Sensation/Coordination)  Upper Extremity Assessment: LUE deficits/detail LUE Deficits / Details: Decreased cooridnation both with fine and gross motor. Pt with poor grasp strength.  LUE Coordination: decreased fine motor, decreased gross motor  Lower Extremity Assessment: Defer to PT evaluation LLE Deficits / Details: MMT testing at 4/5 except DF at 3-/5, however  impaired co-ordination LLE Coordination: decreased fine motor, decreased gross motor    ADLs  Overall ADL's : Needs assistance/impaired Eating/Feeding: Independent, Sitting Grooming: Oral care, Minimal assistance, Standing Grooming Details (indicate cue type and reason): Pt with decreased bilateraly cooridnation, poor grasp with L hand, and coorindation at LUE. Pt also moving quickly and requiring cues to slow down. Mi nA for facilitate normalized movement at LUE. Min A for standing balance Upper Body Bathing: Minimal assistance, Sitting Lower Body Bathing: Minimal assistance, Sit to/from stand Upper Body Dressing : Minimal assistance, Sitting Lower Body Dressing: Minimal assistance, Sit to/from stand Toilet Transfer: Minimal assistance, Ambulation, RW(simulated to recliner) Functional mobility during ADLs: Minimal assistance, Rolling walker General ADL Comments: Pt with decreased coorindation at LUE, poor safety awarnees, and impusivity impacting his functional performance. Pt highly motivated    Mobility  Overal bed mobility: Needs Assistance Bed Mobility: Supine to Sit Supine to sit: Min guard General bed mobility comments: In chair upon entry.     Transfers  Overall transfer level: Needs assistance Equipment used: None Transfers: Sit to/from Stand Sit to Stand: Min assist General transfer comment: Min A for steadying assist. Pt initially pulling up from RW, and slightly impulsive. Cued pt to return to sitting and practiced pressing up from armrests on chair.     Ambulation / Gait / Stairs / Wheelchair Mobility  Ambulation/Gait Ambulation/Gait assistance: Min assist, Mod assist Gait Distance (Feet): 120 Feet Assistive device: Rolling walker (2 wheeled), 1 person hand held assist Gait Pattern/deviations: Step-through pattern, Decreased stride length, Decreased dorsiflexion - left General Gait Details: Pt requiring gross min A for steadying A, however, had 2 instances where he  required mod A secondary to LOB. Pt requiring continuous cues for appropriate step height on LLE to avoid dragging toes. Pt would speed up and required cues for safe gait speed. Heavy reliance on UE support.  Gait velocity: impulsively fast Gait velocity interpretation: <1.8 ft/sec, indicate of risk for recurrent falls    Posture / Balance Balance Overall balance assessment: Needs assistance Sitting-balance support: Feet supported, No upper extremity supported Sitting balance-Leahy Scale: Good Standing balance support: During functional activity, Bilateral upper extremity supported Standing balance-Leahy Scale: Poor Standing balance comment: Reliant on BUE support     Special needs/care consideration BiPAP/CPAP Cpap CPM  no Continuous Drip IV no Dialysis no        Days n/a Life Vest no Oxygen no Special Bed no Trach Size no Wound Vac (area) no      Location n/a Skin intact  Location  Bowel mgmt: continent, last BM 07/01/2018 Bladder mgmt: continent Diabetic mgmt: yes, novolog Behavioral consideration no Chemo/radiation no   Previous Home Environment (from acute therapy documentation) Living Arrangements: Spouse/significant other, Children  Lives With: Spouse Available Help at Discharge: Family, Available PRN/intermittently(wife is a Psychiatric nurse) Type of Home: House Home Layout: One level Home Access: Stairs to enter Entrance Stairs-Rails: Can reach both Entrance Stairs-Number of Steps: 5(in the front, 10 in the back) Bathroom Shower/Tub: Multimedia programmer: Peter [place]: No  Discharge Living Setting Plans for Discharge Living Setting: Patient's home Type of Home at Discharge: House Discharge Home Layout: One level Discharge Home Access: Stairs to enter Entrance Stairs-Rails: Right, Left, Can reach both Entrance Stairs-Number of Steps: 5-10(5 in front, 10 in back) Discharge Bathroom Shower/Tub: Walk-in shower Discharge  Bathroom Toilet: Standard Discharge Bathroom Accessibility: Yes How Accessible: Accessible via walker Does the patient have any problems obtaining your medications?: No  Social/Family/Support Systems Patient Roles: Spouse, Parent Anticipated Caregiver: Spouse [name]) and [place] Jonelle Sidle) Anticipated Caregiver's Contact Information: [name] [phone] Ability/Limitations of Caregiver: none, Jonelle Sidle works in the school system and is home now, [name] owns a Psychiatric nurse shop and has flexibility to be home when needed Caregiver Availability: 24/7 Discharge Plan Discussed with Primary Caregiver: Yes Is Caregiver In Agreement with Plan?: Yes Does Caregiver/Family have Issues with Lodging/Transportation while Pt is in Rehab?: No  Goals/Additional Needs Patient/Family Goal for Rehab: PT/OT supervision to mod I Expected length of stay: 7-10 days Cultural Considerations: none Dietary Needs: heart healthy, carb modified, thin Equipment Needs: tbd Pt/Family Agrees to Admission and willing to participate: Yes Program Orientation Provided & Reviewed with Pt/Caregiver Including Roles  & Responsibilities: Yes   Possible need for SNF placement upon discharge: not anticipated  Patient Condition: I have reviewed medical records from [facility], spoken with CM, and patient and spouse. I met with patient at the bedside and discussed via phone for inpatient rehabilitation assessment.  Patient will benefit from ongoing PT and OT, can actively participate in 3 hours of therapy a day 5 days of the week, and can make measurable gains during the admission.  Patient will also benefit from the coordinated team approach during an Inpatient Acute Rehabilitation admission.  The patient will receive intensive therapy as well as Rehabilitation physician, nursing, social worker, and care management interventions.  Due to safety, disease management, medication administration, pain management and  patient education the patient requires 24 hour a day rehabilitation nursing.  The patient is currently min assist with mobility and basic ADLs.  Discharge setting and therapy post discharge at home with home health is anticipated.  Patient has agreed to participate in the Acute Inpatient Rehabilitation Program and will admit today.  Preadmission Screen Completed By:  Michel Santee, 07/01/2018 12:51 PM ______________________________________________________________________   Discussed status with Dr. Posey Pronto on 07/01/18  at 1:03 PM and received approval for admission today.  Admission Coordinator:  Michel Santee, PT, time 1:04 PM  /R basal ganglia ICH  Date 07/01/18    Assessment/Plan: Diagnosis: Right basal ganglia ICH 1. Does the need for close, 24 hr/day Medical supervision in concert with the patient's rehab needs make it unreasonable for this patient to be served in a less intensive setting? Yes  2. Co-Morbidities requiring supervision/potential complications: HTN (monitor and provide prns in accordance with increased physical exertion and pain), DM (Monitor in accordance with exercise and adjust meds as necessary), hyperlipidemia 3. Due to bladder management, safety, skin/wound  care, disease management and patient education, does the patient require 24 hr/day rehab nursing? Yes 4. Does the patient require coordinated care of a physician, rehab nurse, PT (1-2 hrs/day, 5 days/week) and OT (1-2 hrs/day, 5 days/week) to address physical and functional deficits in the context of the above medical diagnosis(es)? Yes Addressing deficits in the following areas: balance, endurance, locomotion, strength, transferring, bathing, dressing, toileting and psychosocial support 5. Can the patient actively participate in an intensive therapy program of at least 3 hrs of therapy 5 days a week? Yes 6. The potential for patient to make measurable gains while on inpatient rehab is excellent 7. Anticipated  functional outcomes upon discharge from inpatients are: modified independent and supervision PT, modified independent and supervision OT, n/a SLP 8. Estimated rehab length of stay to reach the above functional goals is: 10-15 days. 9. Anticipated D/C setting: Home 10. Anticipated post D/C treatments: HH therapy and Home excercise program 11. Overall Rehab/Functional Prognosis: good  MD Signature: Delice Lesch, MD, ABPMR

## 2018-07-01 NOTE — Progress Notes (Signed)
Peter Arn, MD  Physician  Physical Medicine and Rehabilitation  PMR Pre-admission  Signed  Date of Service:  07/01/2018 12:51 PM       Related encounter: ED to Hosp-Admission (Discharged) from 06/29/2018 in [facility]      Signed         Show:Clear all [x] Manual[x] Template[x] Copied  Added by: [x] Posey Pronto, Domenick Bookbinder, MD[x] Michel Santee, PT  [] Hover for details PMR Admission Coordinator Pre-Admission Assessment  Patient: Peter York is an 73 y.o., male MRN: [id number] DOB: 1946-02-02 Height: 5' 8"  (172.7 cm) Weight: 72 kg  Insurance Information HMO:     PPO: yes     PCP:      IPA:      80/20:      OTHER:  PRIMARY: [facility] [place] Policy#: [id number]      Subscriber: PATIENT CM Name: Benjamine Mola      Phone#: [phone]     Fax#: [phone] Pre-Cert#: [id number]      Employer:  Benefits:  Phone #: [phone]     Name:  Irene Shipper. Date: 03/26/2018     Deduct: $0      Out of Pocket Max: $3,400 (met $0)      Life Max: n/a CIR: $295/day for days 1-6, $0/day remaining days SNF:  $20/day for days 1-20, $160/day for days 21-100, limited to 100 days Outpatient:      Co-Pay: $15/visit limited to medical necessity Home Health: 100%      Co-Pay: $0 *limited to medical necessity DME: 80%     Co-Pay: 20%  SECONDARY:       Policy#:       Subscriber:  CM Name:       Phone#:      Fax#:  Pre-Cert#:       Employer:  Benefits:  Phone #:      Name:  Eff. Date:      Deduct:       Out of Pocket Max:       Life Max:  CIR:       SNF:  Outpatient:      Co-Pay:  Home Health:       Co-Pay:  DME:      Co-Pay:   Medicaid Application Date:       Case Manager:  Disability Application Date:       Case Worker:   The "Data Collection Information Summary" for patients in Inpatient Rehabilitation Facilities with attached "Privacy Act [place] Records" was provided and verbally reviewed with: Patient  Emergency Contact Information          Contact Information    Name Relation Home Work Mobile   Kawano,Carol Spouse [phone] [place] Daughter [phone]  [phone]      Current Medical History  Patient Admitting Diagnosis: 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  Presented 06/28/2020 to [facility] with left side weakness and impaired balance. Blood pressure 170/82. CT of the head showed a 15 mm acute hematoma right basal ganglia internal capsule. CT angiogram of the head showed no large or medium vessel occlusion. 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 Patient was transferred  to [facility] for further evaluation. Maintained on Cardene drip for blood pressure control. Follow-up MRI 06/30/2018 showed small hematoma right basal ganglia without evidence of underlying lesion. No evidence of amyloid angiopathy. Neurology follow-up noted blood pressure control, Cardene drip later discontinued. Patient is tolerating a regular diet.   Complete NIHSS TOTAL: 1  Patient's medical record from [facility] has been reviewed by the rehabilitation admission coordinator and physician.  Past Medical History  Past Medical History:  Diagnosis Date  . Adenomatous colon polyp 12/31/2005  . Diabetes mellitus   . Hypercholesterolemia   . Hypertension     Family History   family history includes Cancer (age of onset: 4) in his father; Colon cancer (age of onset: 39) in his father; Colon polyps in his father; Diabetes (age of onset: 68) in his daughter; Heart attack in his maternal uncle; Heart disease in his maternal uncle, maternal  uncle, and maternal uncle; Transient ischemic attack (age of onset: 77) in his brother.  Prior Rehab/Hospitalizations Has the patient had prior rehab or hospitalizations prior to admission? No  Has the patient had major surgery during 100 days prior to admission? No              Current Medications  Current Facility-Administered Medications:  .  acetaminophen (TYLENOL) tablet 650 mg, 650 mg, Oral, Q4H PRN, 650 mg at 07/01/18 0608 **OR** acetaminophen (TYLENOL) solution 650 mg, 650 mg, Per Tube, Q4H PRN **OR** acetaminophen (TYLENOL) suppository 650 mg, 650 mg, Rectal, Q4H PRN, Kerney Elbe, MD .  amLODipine (NORVASC) tablet 10 mg, 10 mg, Oral, Daily, Kerney Elbe, MD, 10 mg at 06/30/18 2203 .  atorvastatin (LIPITOR) tablet 10 mg, 10 mg, Oral, Daily, Kerney Elbe, MD, 10 mg at 06/30/18 2203 .  calcium carbonate (TUMS - dosed in mg elemental calcium) chewable tablet 200 mg of elemental calcium, 1 tablet, Oral, Daily PRN, Kerney Elbe, MD, 200 mg of elemental calcium at 06/29/18 1734 .  chlorthalidone (HYGROTON) tablet 25 mg, 25 mg, Oral, Daily, Kerney Elbe, MD, 25 mg at 07/01/18 0957 .  cloNIDine (CATAPRES) tablet 0.1 mg, 0.1 mg, Oral, Daily, Kerney Elbe, MD, 0.1 mg at 07/01/18 0957 .  insulin aspart (novoLOG) injection 0-15 Units, 0-15 Units, Subcutaneous, TID WC, Kerney Elbe, MD, 3 Units at 07/01/18 [phone] .  labetalol (NORMODYNE,TRANDATE) injection 5-10 mg, 5-10 mg, Intravenous, Q2H PRN, Rosalin Hawking, MD .  losartan (COZAAR) tablet 50 mg, 50 mg, Oral, Daily, Kerney Elbe, MD, 50 mg at 07/01/18 0957 .  pantoprazole (PROTONIX) EC tablet 40 mg, 40 mg, Oral, QHS, Kerney Elbe, MD, 40 mg at 06/30/18 2203 .  senna-docusate (Senokot-S) tablet 1 tablet, 1 tablet, Oral, BID, Kerney Elbe, MD, 1 tablet at 07/01/18 0957 .  vitamin E capsule 400 Units, 400 Units, Oral, Daily, Kerney Elbe, MD, 400 Units at 06/30/18 2203  Patients Current Diet:     Diet Order                  Diet  heart healthy/carb modified Room service appropriate? Yes with Assist; Fluid consistency: Thin  Diet effective now               Precautions / Restrictions Precautions Precautions: Fall Restrictions Weight Bearing Restrictions: No   Has the patient had 2 or more falls or a fall with injury in the past year? No  Prior Activity Level Community (5-7x/wk): Pt very active, still driving, riding horses, and training hunting dogs  Prior Functional Level Self Care:  Did the patient need help bathing, dressing, using the toilet or eating? Independent  Indoor Mobility: Did the patient need assistance with walking from room to room (with or without device)? Independent  Stairs: Did the patient need assistance with internal or external stairs (with or without device)? Independent  Functional Cognition: Did the patient need help planning regular tasks such as shopping or remembering to take medications? [place] Devices/Equipment: Cane (specify quad or straight), Walker (specify type) Home Equipment: Walker - 2 wheels, Shower seat, Cane - single point  Prior Device Use: Indicate devices/aids used by the patient prior to current illness, exacerbation or injury? None of the above  Current Functional Level Cognition  Arousal/Alertness: Awake/alert Overall Cognitive Status: Impaired/Different from baseline Orientation Level: Oriented X4 Safety/Judgement: Decreased awareness of safety General Comments: pt mildly impulsive however pt moves fast at baseline. pt aware of deficits however has decreased insight to safety Attention: Focused, Sustained Focused Attention: Appears intact(Vigilance WNL: 1/1) Sustained Attention: Appears intact(Serial 7s: 3/3) Memory: Appears intact Awareness: Appears intact(Immediate: 5/5; delayed: 5/5) Problem Solving: Appears intact Executive Function: Reasoning, Sequencing Reasoning: Appears  intact(Abstraction: 2/2) Sequencing: Appears intact(Clock drawing: 3/3)    Extremity Assessment (includes Sensation/Coordination)  Upper Extremity Assessment: LUE deficits/detail LUE Deficits / Details: Decreased cooridnation both with fine and gross motor. Pt with poor grasp strength.  LUE Coordination: decreased fine motor, decreased gross motor  Lower Extremity Assessment: Defer to PT evaluation LLE Deficits / Details: MMT testing at 4/5 except DF at 3-/5, however impaired co-ordination LLE Coordination: decreased fine motor, decreased gross motor    ADLs  Overall ADL's : Needs assistance/impaired Eating/Feeding: Independent, Sitting Grooming: Oral care, Minimal assistance, Standing Grooming Details (indicate cue type and reason): Pt with decreased bilateraly cooridnation, poor grasp with L hand, and coorindation at LUE. Pt also moving quickly and requiring cues to slow down. Mi nA for facilitate normalized movement at LUE. Min A for standing balance Upper Body Bathing: Minimal assistance, Sitting Lower Body Bathing: Minimal assistance, Sit to/from stand Upper Body Dressing : Minimal assistance, Sitting Lower Body Dressing: Minimal assistance, Sit to/from stand Toilet Transfer: Minimal assistance, Ambulation, RW(simulated to recliner) Functional mobility during ADLs: Minimal assistance, Rolling walker General ADL Comments: Pt with decreased coorindation at LUE, poor safety awarnees, and impusivity impacting his functional performance. Pt highly motivated    Mobility  Overal bed mobility: Needs Assistance Bed Mobility: Supine to Sit Supine to sit: Min guard General bed mobility comments: In chair upon entry.     Transfers  Overall transfer level: Needs assistance Equipment used: None Transfers: Sit to/from Stand Sit to Stand: Min assist General transfer comment: Min A for steadying assist. Pt initially pulling up from RW, and slightly impulsive. Cued pt to return to  sitting and practiced pressing up from armrests on chair.     Ambulation / Gait / Stairs / Wheelchair Mobility  Ambulation/Gait Ambulation/Gait assistance: Min assist, Mod assist Gait Distance (Feet): 120 Feet Assistive device: Rolling walker (2 wheeled), 1 person hand held assist Gait Pattern/deviations: Step-through pattern, Decreased stride length, Decreased dorsiflexion - left General Gait Details: Pt requiring gross min A for steadying A, however, had 2 instances where he required mod A secondary to LOB. Pt requiring continuous cues for appropriate step height on LLE to avoid dragging toes. Pt would speed up and required cues for safe gait speed. Heavy reliance on UE support.  Gait velocity: impulsively fast Gait velocity interpretation: <1.8 ft/sec, indicate of risk  for recurrent falls    Posture / Balance Balance Overall balance assessment: Needs assistance Sitting-balance support: Feet supported, No upper extremity supported Sitting balance-Leahy Scale: Good Standing balance support: During functional activity, Bilateral upper extremity supported Standing balance-Leahy Scale: Poor Standing balance comment: Reliant on BUE support     Special needs/care consideration BiPAP/CPAP Cpap CPM  no Continuous Drip IV no Dialysis no        Days n/a Life Vest no Oxygen no Special Bed no Trach Size no Wound Vac (area) no      Location n/a Skin intact                              Location  Bowel mgmt: continent, last BM 07/01/2018 Bladder mgmt: continent Diabetic mgmt: yes, novolog Behavioral consideration no Chemo/radiation no   Previous Home Environment (from acute therapy documentation) Living Arrangements: Spouse/significant other, Children  Lives With: Spouse Available Help at Discharge: Family, Available PRN/intermittently(wife is a Psychiatric nurse) Type of Home: House Home Layout: One level Home Access: Stairs to enter Entrance Stairs-Rails: Can reach both Entrance  Stairs-Number of Steps: 5(in the front, 10 in the back) Bathroom Shower/Tub: Multimedia programmer: [facility]: No  Discharge Living Setting Plans for Discharge Living Setting: Patient's home Type of Home at Discharge: House Discharge Home Layout: One level Discharge Home Access: Stairs to enter Entrance Stairs-Rails: Right, Left, Can reach both Entrance Stairs-Number of Steps: 5-10(5 in front, 10 in back) Discharge Bathroom Shower/Tub: Walk-in shower Discharge Bathroom Toilet: Standard Discharge Bathroom Accessibility: Yes How Accessible: Accessible via walker Does the patient have any problems obtaining your medications?: No  Social/Family/Support Systems Patient Roles: Spouse, Parent Anticipated Caregiver: Spouse [name]) and daughter Jonelle Sidle) Anticipated Caregiver's Contact Information: [name] [phone] Ability/Limitations of Caregiver: none, Jonelle Sidle works in the school system and is home now, [name] owns a Psychiatric nurse shop and has flexibility to be home when needed Caregiver Availability: 24/7 Discharge Plan Discussed with Primary Caregiver: Yes Is Caregiver In Agreement with Plan?: Yes Does Caregiver/Family have Issues with Lodging/Transportation while Pt is in Rehab?: No  Goals/Additional Needs Patient/Family Goal for Rehab: PT/OT supervision to mod I Expected length of stay: 7-10 days Cultural Considerations: none Dietary Needs: heart healthy, carb modified, thin Equipment Needs: tbd Pt/Family Agrees to Admission and willing to participate: Yes Program Orientation Provided & Reviewed with Pt/Caregiver Including Roles  & Responsibilities: Yes   Possible need for SNF placement upon discharge: not anticipated  Patient Condition: I have reviewed medical records from [facility], spoken with CM, and patient and spouse. I met with patient at the bedside and discussed via phone for inpatient rehabilitation assessment.   Patient will benefit from ongoing PT and OT, can actively participate in 3 hours of therapy a day 5 days of the week, and can make measurable gains during the admission.  Patient will also benefit from the coordinated team approach during an Inpatient Acute Rehabilitation admission.  The patient will receive intensive therapy as well as Rehabilitation physician, nursing, social worker, and care management interventions.  Due to safety, disease management, medication administration, pain management and patient education the patient requires 24 hour a day rehabilitation nursing.  The patient is currently min assist with mobility and basic ADLs.  Discharge setting and therapy post discharge at home with home health is anticipated.  Patient has agreed to participate in the Acute Inpatient Rehabilitation Program and will admit today.  Preadmission  Screen Completed By:  Michel Santee, 07/01/2018 12:51 PM ______________________________________________________________________   Discussed status with Dr. Posey Pronto on 07/01/18  at 1:03 PM and received approval for admission today.  Admission Coordinator:  Michel Santee, PT, time 1:04 PM  /R basal ganglia ICH  Date 07/01/18    Assessment/Plan: Diagnosis: Right basal ganglia ICH 1. Does the need for close, 24 hr/day Medical supervision in concert with the patient's rehab needs make it unreasonable for this patient to be served in a less intensive setting? Yes  2. Co-Morbidities requiring supervision/potential complications: HTN (monitor and provide prns in accordance with increased physical exertion and pain), DM (Monitor in accordance with exercise and adjust meds as necessary), hyperlipidemia 3. Due to bladder management, safety, skin/wound care, disease management and patient education, does the patient require 24 hr/day rehab nursing? Yes 4. Does the patient require coordinated care of a physician, rehab nurse, PT (1-2 hrs/day, 5 days/week) and OT (1-2  hrs/day, 5 days/week) to address physical and functional deficits in the context of the above medical diagnosis(es)? Yes Addressing deficits in the following areas: balance, endurance, locomotion, strength, transferring, bathing, dressing, toileting and psychosocial support 5. Can the patient actively participate in an intensive therapy program of at least 3 hrs of therapy 5 days a week? Yes 6. The potential for patient to make measurable gains while on inpatient rehab is excellent 7. Anticipated functional outcomes upon discharge from inpatients are: modified independent and supervision PT, modified independent and supervision OT, n/a SLP 8. Estimated rehab length of stay to reach the above functional goals is: 10-15 days. 9. Anticipated D/C setting: Home 10. Anticipated post D/C treatments: HH therapy and Home excercise program 11. Overall Rehab/Functional Prognosis: good  MD Signature: Delice Lesch, MD, ABPMR        Revision History

## 2018-07-01 NOTE — Progress Notes (Signed)
Pt being transferred to CIR at this time.  Pt alert and oriented.  No complaints.  Report called to 4W.

## 2018-07-01 NOTE — Progress Notes (Signed)
Physical Therapy Treatment Patient Details Name: Peter York MRN: [id number] DOB: [birth year] Today's Date: 07/01/2018    History of Present Illness Peter York is a 73 y.o. male remote Smoker, HTN, on ASA, is admitted at [facility] in transfer from the [facility] ED with a basal ganglia ICH.     PT Comments    Pt progressing towards goals. Pt remains unsteady and slightly impulsive. Required safety cues for increased step height on LLE to avoid tripping and safe gait speed. Required min to mod A for gait with RW secondary to instability. Educated and performed standing/seated LE HEP. Current recommendations appropriate. Will continue to follow acutely to maximize functional mobility independence and safety.    Follow Up Recommendations  CIR;Supervision/Assistance - 24 hour     Equipment Recommendations  Rolling walker with 5" wheels    Recommendations for Other Services Rehab consult     Precautions / Restrictions Precautions Precautions: Fall Restrictions Weight Bearing Restrictions: No    Mobility  Bed Mobility               General bed mobility comments: In chair upon entry.   Transfers Overall transfer level: Needs assistance Equipment used: None Transfers: Sit to/from Stand Sit to Stand: Min assist         General transfer comment: Min A for steadying assist. Pt initially pulling up from RW, and slightly impulsive. Cued pt to return to sitting and practiced pressing up from armrests on chair.   Ambulation/Gait Ambulation/Gait assistance: Min assist;Mod assist Gait Distance (Feet): 120 Feet Assistive device: Rolling walker (2 wheeled);1 person hand held assist Gait Pattern/deviations: Step-through pattern;Decreased stride length;Decreased dorsiflexion - left Gait velocity: impulsively fast   General Gait Details: Pt requiring gross min A for steadying A, however, had 2 instances where he required mod A secondary to LOB. Pt requiring continuous cues for  appropriate step height on LLE to avoid dragging toes. Pt would speed up and required cues for safe gait speed. Heavy reliance on UE support.    Stairs             Wheelchair Mobility    Modified Rankin (Stroke Patients Only) Modified Rankin (Stroke Patients Only) Pre-Morbid Rankin Score: No symptoms Modified Rankin: Moderately severe disability     Balance Overall balance assessment: Needs assistance Sitting-balance support: Feet supported;No upper extremity supported Sitting balance-Leahy Scale: Good     Standing balance support: During functional activity;Bilateral upper extremity supported Standing balance-Leahy Scale: Poor Standing balance comment: Reliant on BUE support                             Cognition Arousal/Alertness: Awake/alert Behavior During Therapy: WFL for tasks assessed/performed;Impulsive Overall Cognitive Status: Impaired/Different from baseline Area of Impairment: Safety/judgement                         Safety/Judgement: Decreased awareness of safety            Exercises General Exercises - Lower Extremity Ankle Circles/Pumps: AROM;Left;10 reps Long Arc Quad: AROM;Left;10 reps(focused on controlled movement ) Hip Flexion/Marching: AROM;Both;10 reps;Standing(using RW and min A ) Mini-Sqauts: AROM;Both;10 reps;Standing(using RW; min A for steadying assist. )    General Comments        Pertinent Vitals/Pain Pain Assessment: No/denies pain    Home Living  Prior Function            PT Goals (current goals can now be found in the care plan section) Acute Rehab PT Goals Patient Stated Goal: get back on my horses PT Goal Formulation: With patient Time For Goal Achievement: 07/14/18 Potential to Achieve Goals: Good Progress towards PT goals: Progressing toward goals    Frequency    Min 4X/week      PT Plan Current plan remains appropriate    Co-evaluation               AM-PAC PT "6 Clicks" Mobility   Outcome Measure  Help needed turning from your back to your side while in a flat bed without using bedrails?: None Help needed moving from lying on your back to sitting on the side of a flat bed without using bedrails?: A Little Help needed moving to and from a bed to a chair (including a wheelchair)?: A Little Help needed standing up from a chair using your arms (e.g., wheelchair or bedside chair)?: A Little Help needed to walk in hospital room?: A Little Help needed climbing 3-5 steps with a railing? : A Lot 6 Click Score: 18    End of Session Equipment Utilized During Treatment: Gait belt Activity Tolerance: Patient tolerated treatment well Patient left: in chair;with call bell/phone within reach Nurse Communication: Mobility status PT Visit Diagnosis: Unsteadiness on feet (R26.81);Difficulty in walking, not elsewhere classified (R26.2)     Time: 3094-0768 PT Time Calculation (min) (ACUTE ONLY): 16 min  Charges:  $Gait Training: 8-22 mins                     Leighton Ruff, PT, DPT  Acute Rehabilitation Services  Pager: [phone] Office: [phone]    Rudean Hitt 07/01/2018, 11:37 AM

## 2018-07-01 NOTE — Progress Notes (Signed)
Patient arrived about 1620 to unit. Patient educated on rehab process, fall safety and received rehab notebook. All questions answered. Urinal, call bell, and cell phone left within reach.

## 2018-07-01 NOTE — Progress Notes (Signed)
Pt is discharging to CIR today. CM signing off.

## 2018-07-02 ENCOUNTER — Inpatient Hospital Stay (HOSPITAL_COMMUNITY): Payer: PPO | Admitting: Occupational Therapy

## 2018-07-02 ENCOUNTER — Inpatient Hospital Stay (HOSPITAL_COMMUNITY): Payer: PPO | Admitting: Physical Therapy

## 2018-07-02 DIAGNOSIS — I610 Nontraumatic intracerebral hemorrhage in hemisphere, subcortical: Secondary | ICD-10-CM

## 2018-07-02 DIAGNOSIS — I69398 Other sequelae of cerebral infarction: Secondary | ICD-10-CM

## 2018-07-02 DIAGNOSIS — R269 Unspecified abnormalities of gait and mobility: Secondary | ICD-10-CM

## 2018-07-02 DIAGNOSIS — I10 Essential (primary) hypertension: Secondary | ICD-10-CM

## 2018-07-02 LAB — COMPREHENSIVE METABOLIC PANEL
ALT: 14 U/L (ref 0–44)
AST: 15 U/L (ref 15–41)
Albumin: 4 g/dL (ref 3.5–5.0)
Alkaline Phosphatase: 63 U/L (ref 38–126)
Anion gap: 12 (ref 5–15)
BUN: 29 mg/dL — ABNORMAL HIGH (ref 8–23)
CO2: 23 mmol/L (ref 22–32)
Calcium: 9.4 mg/dL (ref 8.9–10.3)
Chloride: 99 mmol/L (ref 98–111)
Creatinine, Ser: 1.7 mg/dL — ABNORMAL HIGH (ref 0.61–1.24)
GFR calc Af Amer: 45 mL/min — ABNORMAL LOW (ref >60)
GFR calc non Af Amer: 39 mL/min — ABNORMAL LOW (ref >60)
Glucose, Bld: 179 mg/dL — ABNORMAL HIGH (ref 70–99)
Potassium: 3.7 mmol/L (ref 3.5–5.1)
Sodium: 134 mmol/L — ABNORMAL LOW (ref 135–145)
Total Bilirubin: 1.3 mg/dL — ABNORMAL HIGH (ref 0.3–1.2)
Total Protein: 7.4 g/dL (ref 6.5–8.1)

## 2018-07-02 LAB — CBC WITH DIFFERENTIAL/PLATELET
Abs Immature Granulocytes: 0.03 10*3/uL (ref 0.00–0.07)
Basophils Absolute: 0.1 10*3/uL (ref 0.0–0.1)
Basophils Relative: 1 %
Eosinophils Absolute: 0.3 10*3/uL (ref 0.0–0.5)
Eosinophils Relative: 3 %
HCT: 34.8 % — ABNORMAL LOW (ref 39.0–52.0)
Hemoglobin: 12.1 g/dL — ABNORMAL LOW (ref 13.0–17.0)
Immature Granulocytes: 0 %
Lymphocytes Relative: 12 %
Lymphs Abs: 1.2 10*3/uL (ref 0.7–4.0)
MCH: 31.8 pg (ref 26.0–34.0)
MCHC: 34.8 g/dL (ref 30.0–36.0)
MCV: 91.3 fL (ref 80.0–100.0)
Monocytes Absolute: 0.8 10*3/uL (ref 0.1–1.0)
Monocytes Relative: 8 %
Neutro Abs: 7.6 10*3/uL (ref 1.7–7.7)
Neutrophils Relative %: 76 %
Platelets: 265 10*3/uL (ref 150–400)
RBC: 3.81 MIL/uL — ABNORMAL LOW (ref 4.22–5.81)
RDW: 12.7 % (ref 11.5–15.5)
WBC: 9.9 10*3/uL (ref 4.0–10.5)
nRBC: 0 % (ref 0.0–0.2)

## 2018-07-02 LAB — GLUCOSE, CAPILLARY
Glucose-Capillary: 169 mg/dL — ABNORMAL HIGH (ref 70–99)
Glucose-Capillary: 175 mg/dL — ABNORMAL HIGH (ref 70–99)
Glucose-Capillary: 229 mg/dL — ABNORMAL HIGH (ref 70–99)
Glucose-Capillary: 189 mg/dL — ABNORMAL HIGH (ref 70–99)

## 2018-07-02 MED ORDER — FLUTICASONE PROPIONATE 50 MCG/ACT NA SUSP
1.0000 | Freq: Every day | NASAL | Status: DC
  Administered 2018-07-02 – 2018-07-09 (×8): 1 via NASAL
  Filled 2018-07-02 (×2): qty 16

## 2018-07-02 NOTE — Progress Notes (Signed)
PHYSICAL MEDICINE & REHABILITATION PROGRESS NOTE   Subjective/Complaints:  Dry cough only in shower and with exercise, sinus congestion   ROS- Negative CP, SOB, N/V/D                                                                                 Objective:   No results found. Recent Labs    06/29/18 1401 07/02/18 0504  WBC 10.2 9.9  HGB 11.9* 12.1*  HCT 33.8* 34.8*  PLT 259 265   Recent Labs    06/29/18 1401 07/02/18 0504  NA 137 134*  K 3.9 3.7  CL 105 99  CO2 19* 23  GLUCOSE 182* 179*  BUN 31* 29*  CREATININE 1.47* 1.70*  CALCIUM 9.5 9.4    Intake/Output Summary (Last 24 hours) at 07/02/2018 0905 Last data filed at 07/02/2018 0700 Gross per 24 hour  Intake 240 ml  Output 750 ml  Net -510 ml     Physical Exam: Vital Signs Blood pressure (!) 144/88, pulse 74, temperature 98 F (36.7 C), temperature source Oral, resp. rate 18, height 5\' 7"  (1.702 m), weight 72.1 kg, SpO2 97 %.   General: No acute distress Mood and affect are appropriate Heart: Regular rate and rhythm no rubs murmurs or extra sounds Lungs: Clear to auscultation, breathing unlabored, no rales or wheezes Abdomen: Positive bowel sounds, soft nontender to palpation, nondistended Extremities: No clubbing, cyanosis, or edema Skin: No evidence of breakdown, no evidence of rash Neurologic: Cranial nerves II through XII intact, motor strength is 5/5 in bilateral deltoid, bicep, tricep, grip, hip flexor, knee extensors, ankle dorsiflexor and plantar flexor Sensory exam normal sensation to light touch and proprioception in bilateral upper and lower extremities + dysdiadochokinesis with RAM on left Reduced finger to thumb opposition Musculoskeletal: Full range of motion in all 4 extremities. No joint swelling   Assessment/Plan: 1. Functional deficits secondary to RIght ICH which require 3+ hours per day of interdisciplinary therapy in a comprehensive inpatient rehab setting.  Physiatrist is  providing close team supervision and 24 hour management of active medical problems listed below.  Physiatrist and rehab team continue to assess barriers to discharge/monitor patient progress toward functional and medical goals  Care Tool:  Bathing              Bathing assist       Upper Body Dressing/Undressing Upper body dressing        Upper body assist      Lower Body Dressing/Undressing Lower body dressing            Lower body assist       Toileting Toileting    Toileting assist       Transfers Chair/bed transfer  Transfers assist           Locomotion Ambulation   Ambulation assist              Walk 10 feet activity   Assist           Walk 50 feet activity   Assist           Walk 150 feet activity   Assist  Walk 10 feet on uneven surface  activity   Assist           Wheelchair     Assist               Wheelchair 50 feet with 2 turns activity    Assist            Wheelchair 150 feet activity     Assist            Medical Problem List and Plan: 1.  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            CIR evals  2.  Antithrombotics: -DVT/anticoagulation:  SCDs             -antiplatelet therapy: N/A 3. Pain Management:  Tylenol as needed 4. Mood:  Provide emotional support             -antipsychotic agents: N/A 5. Neuropsych: This patient is capable of making decisions on his own behalf. 6. Skin/Wound Care:  Routine skin checks 7. Fluids/Electrolytes/Nutrition:  Routine in and out's with follow-up chemistries in the AM. 8. Hypertension. Norvasc 10 mg daily, clonidine 0.1 mg daily, chlorthalidone 25 mg daily, Cozaar 50 mg daily. Monitor with increased mobility.  Vitals:   07/01/18 1943 07/02/18 0418  BP: 127/74 (!) 144/88  Pulse: 66 74  Resp: 16 18  Temp: 98.3 F (36.8 C) 98 F (36.7 C)  SpO2: 97% 97%   9.  Diabetes mellitus. Hemoglobin A1c 6.8. SSI. Patient on Amaryl 4 mg daily and metformin 500 mg twice a day prior to admission. Resume as needed. Monitor with increased mobility.  CBG (last 3)  Recent Labs    07/01/18 1754 07/01/18 2113 07/02/18 0623  GLUCAP 95 163* 169*   10. Hyperlipidemia. Lipitor 11.  Sinus congestion flonase daily with prn ocean LOS: 1 days A FACE TO FACE EVALUATION WAS PERFORMED  Charlett Blake 07/02/2018, 9:05 AM

## 2018-07-02 NOTE — Evaluation (Signed)
Occupational Therapy Assessment and Plan  Patient Details  Name: Peter York MRN: [id number] Date of Birth: 1945/11/01  OT Diagnosis: abnormal posture, hemiplegia affecting non-dominant side, muscle weakness (generalized) and coordination disorder Rehab Potential: Rehab Potential (ACUTE ONLY): Good ELOS: 7 days   Today's Date: 07/02/2018 OT Individual Time: 2505-3976 and 1230-1330 OT Individual Time Calculation (min): 71 min   And 60 min   Problem List:  Patient Active Problem List   Diagnosis Date Noted  . Hemorrhagic stroke ([place])   . Left-sided weakness   . Benign essential HTN   . Diabetes mellitus type 2 in nonobese (HCC)   . ICH (intracerebral hemorrhage) (HCC) hypertensive R Basal ganglia bleed 06/29/2018  . Stroke (cerebrum) ([place]) 06/29/2018  . 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 Compression fracture of thoracic vertebra ([place]) 04/11/2016  . Family history of coronary arteriosclerosis 04/07/2015  . Type 2 diabetes mellitus without complication ([place]) 73/41/9379  . Mixed hyperlipidemia 10/06/2012  . Hypertension 10/06/2012  . History of colonic polyps 07/19/2011    Past Medical History:  Past Medical History:  Diagnosis Date  . Adenomatous colon polyp 12/31/2005  . Diabetes mellitus   . Hypercholesterolemia   . Hypertension    Past Surgical History:  Past Surgical History:  Procedure Laterality Date  . ANAL FISTULECTOMY  10/02/2011   Procedure: FISTULECTOMY ANAL;  Surgeon: Joyice Faster. Cornett, MD;  Location: [facility];  Service: General;  Laterality: N/A;  excision perianal mass   . APPENDECTOMY    . COLONOSCOPY W/ POLYPECTOMY  12/31/2005   Dr. Silvano Rusk  . UMBILICAL HERNIA REPAIR      Assessment & Plan Clinical Impression: Patient is a 73 y.o. year old male with history of hypertension maintained on Norvasc 10 mg daily, clonidine 0.1 mg daily, chlorthalidone 25 mg daily,Cozaar 50 mg daily diabetes mellitus maintained on Amaryl 4 mg daily and  metformin 500 mg twice a day, hyperlipidemia.Per chart review and patient, patient lives with spouse. Independent and very active.One level home with 5 steps to entry. Wife is a Psychiatric nurse. They have a daughter that can also assist. Presented 06/29/2018 to [facility] with left side weakness and balance deficis. Blood pressure 170/82. CT of the head showed a 15 mm acute hematoma right basal ganglia internal capsule. CT angiogram of the head showed no large or medium vessel occlusion. 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 Patient was transferred to [facility] for further evaluation. Maintained on Cardene drip for blood pressure control. Follow-up MRI 06/30/2018 showed small hematoma right basal ganglia without evidence of underlying lesion. No evidence of amyloid angiopathy. Neurology follow-up noted blood pressure control Cardene drip later discontinued. Patient is tolerating a regular diet. Therapy evaluations completed with recommendations of physical medicine rehabilitation consult. .  Patient transferred to CIR on 07/01/2018 .    Patient currently requires min with basic self-care skills and IADL secondary to muscle weakness, decreased cardiorespiratoy endurance, ataxia, decreased coordination and decreased motor planning and decreased standing balance and decreased balance strategies.  Prior to hospitalization, patient could complete ADLs and IADLs with independent .  Patient will benefit from skilled intervention to increase independence with basic self-care skills prior to discharge home with care partner.  Anticipate patient will require intermittent supervision and follow up home health.  OT - End of Session Activity Tolerance: Decreased this session Endurance Deficit: Yes Endurance Deficit Description: multiple rest breaks secondary to fatigue OT Assessment Rehab Potential (ACUTE ONLY): Good OT Barriers to Discharge:  Other (comments) OT Barriers to Discharge Comments: none known at  this time OT Patient demonstrates impairments in the following area(s): Balance;Cognition;Endurance;Motor;Pain;Safety OT Basic ADL's Functional Problem(s): Grooming;Bathing;Dressing;Toileting OT Transfers Functional Problem(s): Toilet;Tub/Shower OT Plan OT Intensity: Minimum of 1-2 x/day, 45 to 90 minutes OT Frequency: 5 out of 7 days OT Duration/Estimated Length of Stay: 7 days OT Treatment/Interventions: Balance/vestibular training;Neuromuscular re-education;Self Care/advanced ADL retraining;Therapeutic Exercise;DME/adaptive equipment instruction;Pain management;Skin care/wound managment;UE/LE Strength taining/ROM;Community reintegration;Patient/family education;Splinting/orthotics;UE/LE Coordination activities;Discharge planning;Functional mobility training;Psychosocial support;Therapeutic Activities OT Self Feeding Anticipated Outcome(s): n/a OT Basic Self-Care Anticipated Outcome(s): mod I  OT Toileting Anticipated Outcome(s): mod I  OT Bathroom Transfers Anticipated Outcome(s): mod I - toilet and S- shower OT Recommendation Recommendations for Other Services: Therapeutic Recreation consult Therapeutic Recreation Interventions: Other (comment);Pet therapy(loves horses and trains dogs) Patient destination: Home Follow Up Recommendations: Home health OT Equipment Recommended: To be determined   Skilled Therapeutic Intervention Session 1: Upon entering the room, pt supine in bed with no c/o pain and agreeable to OT intervention. Pt performed bed mobility with supervision. Pt ambulating with RW to bathroom with min guard. Pt transferred onto TTB for bathing and needing min verbal cuing for safety awareness. Min guard for standing balance while washing buttocks and peri area. Pt returning to sit on EOB to don clothing items with overall min A for standing balance with LB clothing management. Pt standing at sink for grooming tasks and returning to sit in recliner chair. Chair alarm activated and  call bell within reach. OT educated pt on OT purpose, POC, and goals with pt verbalizing understanding.   Session 2: Pt ambulating to dynavision gym with min guard and use of RW 100'. Pt taking seated rest break. Pt engaged in 4 minutes standing and then 2 minutes standing on air ex foam to complete tasks of hitting targets with L UE on L side of board and use of R UE on R side of board. Pt with no LOB and average of L side being 2.1 seconds and R being 1.44 seconds. Pt engaged in 9 hole peg test with average being 26.31seconds on the R and 67.7 seconds on the L with 2 drops occurring during test. Pt ambulating back to room at at end of session with call bell and all needed items within reach.  OT Evaluation Precautions/Restrictions  Precautions Precautions: Fall   Pain Pain Assessment Pain Scale: 0-10 Pain Score: 0-No pain Home Living/Prior Functioning Home Living Family/patient expects to be discharged to:: Private residence Living Arrangements: Spouse/significant other Available Help at Discharge: Family, Available PRN/intermittently Type of Home: House Home Access: Stairs to enter [employer] of Steps: 5 in front and 10 steps in back Entrance Stairs-Rails: Can reach both(at back door ) Home Layout: One level Bathroom Shower/Tub: Multimedia programmer: Associate Professor Accessibility: Yes  Lives With: Spouse, Daughter Prior Function Level of Independence: Independent with transfers, Independent with homemaking with ambulation, Independent with gait  Able to Take Stairs?: Yes Driving: Yes Vocation: Retired Leisure: Hobbies-yes (Comment) Comments: train bird dogds  Vision Baseline Vision/History: Wears glasses Wears Glasses: At all times Patient Visual Report: No change from baseline Vision Assessment?: No apparent visual deficits Perception  Perception: Within Functional Limits Praxis Praxis: Intact Cognition Overall Cognitive Status: Within  Functional Limits for tasks assessed Arousal/Alertness: Awake/alert Orientation Level: Person;Situation;Place Person: Oriented Place: Oriented Situation: Oriented Year: 2020 Month: April Day of Week: Correct Memory: Appears intact Immediate Memory Recall: Sock;Blue;Bed Memory Recall: Sock;Blue;Bed Memory Recall Sock: Without Cue Memory Recall Blue:  Without Cue Memory Recall Bed: Without Cue Focused Attention: Appears intact Sustained Attention: Appears intact Awareness: Appears intact Problem Solving: Appears intact Sensation Sensation Light Touch: Impaired Detail Light Touch Impaired Details: Impaired LUE(WFL in the LLE) Hot/Cold: Appears Intact Proprioception: Impaired Detail Proprioception Impaired Details: Impaired LUE;Impaired LLE Coordination Gross Motor Movements are Fluid and Coordinated: No Fine Motor Movements are Fluid and Coordinated: No Coordination and Movement Description: dysmetria  Finger Nose Finger Test: decreased coordination and dexterity of L UE Heel Shin Test: dysmetric Motor  Motor Motor: Hemiplegia;Ataxia Motor - Skilled Clinical Observations: Lside Hemiplegia UE>LE Mobility  Bed Mobility Bed Mobility: Rolling Left;Rolling Right;Supine to Sit;Sit to Supine Rolling Right: Supervision/verbal cueing Rolling Left: Supervision/Verbal cueing Supine to Sit: Supervision/Verbal cueing Sit to Supine: Supervision/Verbal cueing Transfers Sit to Stand: Minimal Assistance - Patient > 75%  Trunk/Postural Assessment  Cervical Assessment Cervical Assessment: Within Functional Limits Thoracic Assessment Thoracic Assessment: Within Functional Limits Lumbar Assessment Lumbar Assessment: Within Functional Limits Postural Control Postural Control: Deficits on evaluation  Balance Balance Balance Assessed: Yes Standardized Balance Assessment Standardized Balance Assessment: Berg Balance Test Berg Balance Test Sit to Stand: Able to stand without using hands  and stabilize independently Standing Unsupported: Able to stand safely 2 minutes Sitting with Back Unsupported but Feet Supported on Floor or Stool: Able to sit safely and securely 2 minutes Stand to Sit: Sits safely with minimal use of hands Transfers: Able to transfer safely, definite need of hands Standing Unsupported with Eyes Closed: Able to stand 10 seconds with supervision Standing Ubsupported with Feet Together: Able to place feet together independently and stand for 1 minute with supervision From Standing, Reach Forward with Outstretched Arm: Can reach forward >12 cm safely (5") From Standing Position, Pick up Object from Floor: Able to pick up shoe, needs supervision From Standing Position, Turn to Look Behind Over each Shoulder: Looks behind one side only/other side shows less weight shift Turn 360 Degrees: Needs close supervision or verbal cueing Standing Unsupported, Alternately Place Feet on Step/Stool: Able to complete >2 steps/needs minimal assist Standing Unsupported, One Foot in Front: Able to take small step independently and hold 30 seconds Standing on One Leg: Able to lift leg independently and hold 5-10 seconds Total Score: 41 Dynamic Sitting Balance Dynamic Sitting - Level of Assistance: 5: Stand by assistance Static Standing Balance Static Standing - Level of Assistance: 5: Stand by assistance Dynamic Standing Balance Dynamic Standing - Level of Assistance: 4: Min assist Extremity/Trunk Assessment RUE Assessment RUE Assessment: Within Functional Limits LUE Assessment LUE Assessment: Within Functional Limits General Strength Comments: 3+/5 throughout with decreased coordination     Refer to Care Plan for Long Term Goals  Recommendations for other services: Neuropsych and Therapeutic Recreation  Pet therapy and Outing/community reintegration   Discharge Criteria: Patient will be discharged from OT if patient refuses treatment 3 consecutive times without  medical reason, if treatment goals not met, if there is a change in medical status, if patient makes no progress towards goals or if patient is discharged from hospital.  The above assessment, treatment plan, treatment alternatives and goals were discussed and mutually agreed upon: by patient  Gypsy Decant 07/02/2018, 12:18 PM

## 2018-07-02 NOTE — Patient Care Conference (Signed)
Inpatient RehabilitationTeam Conference and Plan of Care Update Date: 07/02/2018   Time: 10:25 AM    Patient Name: Peter York      Medical Record Number: [id number]  Date of Birth: 03/25/1946 Sex: Male         Room/Bed: 4W26C/4W26C-01 Payor Info: Payor: [employer] / Plan: [employer] PPO / Product Type: *No Product type* /    Admitting Diagnosis: right basal ganglia hemorrhage  Admit Date/Time:  07/01/2018  4:02 PM Admission Comments: No comment available   Primary Diagnosis:  <principal problem not specified> Principal Problem: <principal problem not specified>  Patient Active Problem List   Diagnosis Date Noted  . Hemorrhagic stroke ([place])   . Left-sided weakness   . Benign essential HTN   . Diabetes mellitus type 2 in nonobese (HCC)   . ICH (intracerebral hemorrhage) (HCC) hypertensive R Basal ganglia bleed 06/29/2018  . Stroke (cerebrum) ([place]) 06/29/2018  . 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 Compression fracture of thoracic vertebra ([place]) 04/11/2016  . Family history of coronary arteriosclerosis 04/07/2015  . Type 2 diabetes mellitus without complication ([place]) 33/00/7622  . Mixed hyperlipidemia 10/06/2012  . Hypertension 10/06/2012  . History of colonic polyps 07/19/2011    Expected Discharge Date:    Team Members Present: Physician leading conference: Dr. Alysia Penna Social Worker Present: Ovidio Kin, LCSW Nurse Present: Rayetta Humphrey, RN PT Present: Barrie Folk, PT;[name], PTA OT Present: Darleen Crocker, OT SLP Present: Charolett Bumpers, SLP PPS Coordinator present : Gunnar Fusi     Current Status/Progress Goal Weekly Team Focus  Medical   Mild left upper extremity fine motor deficits.  Ambulating with walker.  Blood pressures well controlled.  Maintain medical stability reduce fall risk  Initiate rehabilitation program   Bowel/Bladder   Continent of B&B; LBM 06/30/18   Patient remain continent of B& B  Assess Bowel and Bladder  q shift and as needed   Swallow/Nutrition/ Hydration             ADL's     eval pending        Mobility     eval pending        Communication             Safety/Cognition/ Behavioral Observations            Pain   C/ o generalized pain and Headaches  Pain < or = 2   Assess pain q shift and prrovide prn pain medications as needed and provide alternative pain management   Skin   Bilateral upper and lower  skin abrasion and ecchymosis  Remain free of infection and skin abrasion and ecchymosis  Monitor and assess skin q shift and provide any necessasy treatment as ordered      *See Care Plan and progress notes for long and short-term goals.     Barriers to Discharge  Current Status/Progress Possible Resolutions Date Resolved   Physician    Medical stability     Initiating therapy  See above      Nursing                  PT  Decreased caregiver support;Home environment access/layout                 OT Other (comments)  none known at this time             SLP  SW                Discharge Planning/Teaching Needs:    Home with wife who does work part time, but flexible and may need to provide care to pt at discharge from rehab. Daughter also involved in his care     Team Discussion:  Evaluating in all areas, setting goals and ELOS. Medically MD is adjusting medications.   Revisions to Treatment Plan:  New eval    Continued Need for Acute Rehabilitation Level of Care: The patient requires daily medical management by a physician with specialized training in physical medicine and rehabilitation for the following conditions: Daily direction of a multidisciplinary physical rehabilitation program to ensure safe treatment while eliciting the highest outcome that is of practical value to the patient.: Yes Daily medical management of patient stability for increased activity during participation in an intensive rehabilitation regime.: Yes Daily analysis of laboratory  values and/or radiology reports with any subsequent need for medication adjustment of medical intervention for : Neurological problems   I attest that I was present, lead the team conference, and concur with the assessment and plan of the team. Teleconference held due to COVID-19   Elease Hashimoto 07/02/2018, 2:34 PM

## 2018-07-02 NOTE — Progress Notes (Signed)
Inpatient Rehabilitation  Patient information reviewed and entered into eRehab system by Gifford Ballon M. Caroly Purewal, M.A., CCC/SLP, PPS Coordinator.  Information including medical coding, functional ability and quality indicators will be reviewed and updated through discharge.    

## 2018-07-02 NOTE — Progress Notes (Signed)
Social Work  Social Work Assessment and Plan  Patient Details  Name: Peter York MRN: [id number] Date of Birth: 1945/07/27  Today's Date: 07/02/2018  Problem List:  Patient Active Problem List   Diagnosis Date Noted  . Hemorrhagic stroke ([place])   . Left-sided weakness   . Benign essential HTN   . Diabetes mellitus type 2 in nonobese (HCC)   . ICH (intracerebral hemorrhage) (HCC) hypertensive R Basal ganglia bleed 06/29/2018  . Stroke (cerebrum) ([place]) 06/29/2018  . 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 Compression fracture of thoracic vertebra ([place]) 04/11/2016  . Family history of coronary arteriosclerosis 04/07/2015  . Type 2 diabetes mellitus without complication ([place]) 10/62/6948  . Mixed hyperlipidemia 10/06/2012  . Hypertension 10/06/2012  . History of colonic polyps 07/19/2011   Past Medical History:  Past Medical History:  Diagnosis Date  . Adenomatous colon polyp 12/31/2005  . Diabetes mellitus   . Hypercholesterolemia   . Hypertension    Past Surgical History:  Past Surgical History:  Procedure Laterality Date  . ANAL FISTULECTOMY  10/02/2011   Procedure: FISTULECTOMY ANAL;  Surgeon: Joyice Faster. Cornett, MD;  Location: [facility];  Service: General;  Laterality: N/A;  excision perianal mass   . APPENDECTOMY    . COLONOSCOPY W/ POLYPECTOMY  12/31/2005   Dr. Silvano Rusk  . UMBILICAL HERNIA REPAIR     Social History:  reports that he quit smoking about 40 years ago. His smoking use included cigarettes. He has a 11.25 pack-year smoking history. He has never used smokeless tobacco. He reports current alcohol use. He reports that he does not use drugs.  Family / Support Systems Marital Status: Married Patient Roles: Spouse, Parent, Other (Comment)(Trainer-dogs and horses) Spouse/Significant Other: [name] [phone]  [phone]  [phone] Children: Tamara-daughter [phone] Other Supports: Friends and neighbors Anticipated Caregiver: Wife  and daughter-laid off due to COVID-19 Ability/Limitations of Caregiver: Wife owns florist so is felexible and daugher is a Pharmacist, hospital and off work at this time Careers adviser: 24/7 Family Dynamics: Close knit fmaily who rely upon one another, all will do whatever is needed to assist pt and help with his progress. He is doing well and feels he can reach his mod/i level goals.  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 he feels his main issue is his coordination he has good movement but needs to be able to coordinate it again in his arm and leg. He has always been independent and stubborn and will get there he feels. Recent Psychosocial Issues: other health issues were managed Psychiatric History: No history deferred depression screen due to doing well and able to verbalize his concerns and feelings. May benefit from seeing neuro-psych if will not reach the goals he has set for himself. Will montitor and get input from team. Substance Abuse History: No issues  Patient / Family Perceptions, Expectations & Goals Pt/Family understanding of illness & functional limitations: Pt is able to explain  his stroke and deficits, he does talk with the MD daily and feels his questions and concerns are being  addressed. He feels it is really up to him and his progress now. He is glad to be here on CIR Premorbid pt/family roles/activities: husband, father, trainer, friend, nieghbor, etc Anticipated changes in roles/activities/participation: resume Pt/family expectations/goals: Pt states: " I plan to be able to return to training my animals, but have to be independent to do this."  Wife states: " If there is a will there is a way for him, he will figure it out he always does."  [employer]: None Premorbid Home Care/DME Agencies: Other (Comment)(has rw, tub seat from other family members) Transportation available at discharge: Family Resource referrals recommended: Support group (specify)  Discharge Planning Living Arrangements: Spouse/significant other, Children Support Systems: Spouse/significant other, Children Type of Residence: Private residence Insurance Resources: Multimedia programmer (specify)(Health Team Adv) Financial Resources: Social Security, Family Support Financial Screen Referred: No Living Expenses: Own Money Management: Patient, Spouse Does the patient have any problems obtaining your medications?: No Home Management: Wife and daughter Patient/Family Preliminary Plans: Return home with wife and daughter to assist if needed. He is hoping this will not be necessary and he will be independent when he leaves here. Will await team evaluations and work on a safe plan for him. Social Work Anticipated Follow Up Needs: HH/OP, Support Group  Clinical Impression Very pleasant gentleman who is used to being very independent and outside all day. Wife and daughter are involved and supportive. Daughter laid off due to teacher and can assist. Wife runs a florist and is flexible. Will work on plans and keep wife update on pt's progress.  Elease Hashimoto 07/02/2018, 12:29 PM

## 2018-07-02 NOTE — Plan of Care (Signed)
Inpatient Rehabilitation  Due to the current state of emergency from COVID-19, patients may not be receiving their 3-hours of Medicare-mandated therapy.    

## 2018-07-02 NOTE — Care Management Note (Signed)
Inpatient Rehabilitation Center Individual Statement of Services  Patient Name:  Peter York  Date:  07/02/2018  Welcome to the [facility].  Our goal is to provide you with an individualized program based on your diagnosis and situation, designed to meet your specific needs.  With this comprehensive rehabilitation program, you will be expected to participate in at least 3 hours of rehabilitation therapies Monday-Friday, with modified therapy programming on the weekends.  Your rehabilitation program will include the following services:  Physical Therapy (PT), Occupational Therapy (OT), 24 hour per day rehabilitation nursing, Case Management (Social Worker), Rehabilitation Medicine, Nutrition Services and Pharmacy Services  Weekly team conferences will be held on Wednesday to discuss your progress.  Your Social Worker will talk with you frequently to get your input and to update you on team discussions.  Team conferences with you and your family in attendance may also be held.  Expected length of stay: 7-10 days  Overall anticipated outcome: mod/i level  Depending on your progress and recovery, your program may change. Your Social Worker will coordinate services and will keep you informed of any changes. Your Social Worker's name and contact numbers are listed  below.  The following services may also be recommended but are not provided by the [facility] will be made to provide these services after discharge if needed.  Arrangements include referral to agencies that provide these services.  Your insurance has been verified to be:  Health team Advantage Your primary doctor is:  Chevis Pretty  Pertinent information will be shared with your doctor and your insurance company.  Social Worker:  Ovidio Kin,  [place] or ([phone]  Information discussed with and copy given to patient by: Elease Hashimoto, 07/02/2018, 12:53 PM

## 2018-07-02 NOTE — Evaluation (Signed)
Physical Therapy Assessment and Plan  Patient Details  Name: Peter York MRN: [id number] Date of Birth: [birth year]  PT Diagnosis: Abnormality of gait, Hemiplegia non-dominant and Muscle weakness Rehab Potential: Good ELOS: 7-10 days    Today's Date: 07/02/2018 PT Individual Time: 1100-1200 1515-1600 PT Individual Time Calculation (min): 60 min  And 45 min   Problem List:  Patient Active Problem List   Diagnosis Date Noted  . Hemorrhagic stroke ([place])   . Left-sided weakness   . Benign essential HTN   . Diabetes mellitus type 2 in nonobese (HCC)   . ICH (intracerebral hemorrhage) (HCC) hypertensive R Basal ganglia bleed 06/29/2018  . Stroke (cerebrum) ([place]) 06/29/2018  . 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 Compression fracture of thoracic vertebra ([place]) 04/11/2016  . Family history of coronary arteriosclerosis 04/07/2015  . Type 2 diabetes mellitus without complication ([place]) 81/77/1165  . Mixed hyperlipidemia 10/06/2012  . Hypertension 10/06/2012  . History of colonic polyps 07/19/2011    Past Medical History:  Past Medical History:  Diagnosis Date  . Adenomatous colon polyp 12/31/2005  . Diabetes mellitus   . Hypercholesterolemia   . Hypertension    Past Surgical History:  Past Surgical History:  Procedure Laterality Date  . ANAL FISTULECTOMY  10/02/2011   Procedure: FISTULECTOMY ANAL;  Surgeon: Joyice Faster. Cornett, MD;  Location: [facility];  Service: General;  Laterality: N/A;  excision perianal mass   . APPENDECTOMY    . COLONOSCOPY W/ POLYPECTOMY  12/31/2005   Dr. Silvano Rusk  . UMBILICAL HERNIA REPAIR      Assessment & Plan Clinical Impression: Patient is a35 year old right-handed male history of hypertension maintained on Norvasc 10 mg daily, clonidine 0.1 mg daily, chlorthalidone 25 mg daily,Cozaar 50 mg daily diabetes mellitus maintained on Amaryl 4 mg daily and metformin 500 mg twice a day, hyperlipidemia.Per chart review and patient, patient  lives with spouse. Independent and very active.One level home with 5 steps to entry. Wife is a Psychiatric nurse. They have a daughter that can also assist. Presented 06/29/2018 to [facility] with left side weakness and balance deficis. Blood pressure 170/82. CT of the head showed a 15 mm acute hematoma right basal ganglia internal capsule. CT angiogram of the head showed no large or medium vessel occlusion. 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 Patient was transferred to [facility] for further evaluation. Maintained on Cardene drip for blood pressure control. Follow-up MRI 06/30/2018 showed small hematoma right basal ganglia without evidence of underlying lesion. No evidence of amyloid angiopathy. Neurology follow-up noted blood pressure control Cardene drip later discontinued.   Patient transferred to CIR on 07/01/2018 .   Patient currently requires min with mobility secondary to muscle weakness, unbalanced muscle activation, ataxia and decreased coordination and decreased standing balance, decreased postural control, hemiplegia and decreased balance strategies.  Prior to hospitalization, patient was independent  with mobility and lived with Spouse, Daughter in a House home.  Home access is 5 in front and 10 steps in backStairs to enter.  Patient will benefit from skilled PT intervention to maximize safe functional mobility, minimize fall risk and decrease caregiver burden for planned discharge home with intermittent assist.  Anticipate patient will benefit from follow up [facility] at discharge.  PT - End of Session Activity Tolerance: Tolerates 10 - 20 min activity with multiple rests Endurance Deficit: Yes PT Assessment Rehab Potential (ACUTE/IP ONLY): Good PT Barriers to Discharge: Decreased caregiver support;Home environment access/layout PT Patient demonstrates impairments in the  following area(s): Balance;Edema;Endurance;Motor;Nutrition;Pain;Safety;Sensory PT Transfers Functional Problem(s): Bed  Mobility;Car;Bed to Chair;Furniture;Floor PT Locomotion Functional Problem(s): Ambulation;Wheelchair Mobility;Stairs PT Plan PT Intensity: Minimum of 1-2 x/day ,45 to 90 minutes PT Frequency: 5 out of 7 days PT Duration Estimated Length of Stay: 7-10 days  PT Treatment/Interventions: Ambulation/gait training;Balance/vestibular training;Community reintegration;Disease management/prevention;Discharge planning;DME/adaptive equipment instruction;Functional electrical stimulation;Functional mobility training;Neuromuscular re-education;Pain management;Patient/family education;Skin care/wound management;Psychosocial support;Splinting/orthotics;Stair training;Therapeutic Exercise;UE/LE Coordination activities;Wheelchair propulsion/positioning;Visual/perceptual remediation/compensation;UE/LE Strength taining/ROM;Therapeutic Activities PT Transfers Anticipated Outcome(s): Mod I with LRAD  PT Locomotion Anticipated Outcome(s): Md I with LRAD  PT Recommendation Recommendations for Other Services: Therapeutic Recreation consult Therapeutic Recreation Interventions: Outing/community reintergration Follow Up Recommendations: Home health PT Patient destination: Home Equipment Recommended: To be determined  Skilled Therapeutic Intervention Session 1 Pt received sitting in WC and agreeable to PT.  PT instructed patient in PT Evaluation and initiated treatment intervention; see below for results. PT educated patient in [place], rehab potential, rehab goals, and discharge recommendations.  Patient demonstrates increased fall risk as noted by score o41/56 f  on Berg Balance Scale.  (<36= high risk for falls, close to 100%; 37-45 significant >80%; 46-51 moderate >50%; 52-55 lower >25%) Patient returned to room and left sitting in [facility] with call bell in reach and all needs met.     Session 2  Pt received supine in bed and agreeable to PT. Supine>sit transfer with supervision assist and min cues. Sit>stand from bed with  supervision assist and RW.   Gait training with RW through hall 2x135f CGA from PT for safety and intermittent min assist from PT for turns to the R. Dynamic gait training with and without AD to weave through 4 cones x 4 each. Min assist from PT with moderate cues for gait pattern and AD management. Pt also instructed in stepping over 1 inch canes 2 x 4  with min-mod assist from PT and no AD.   Nustep reciprocal movement and endurance training 6 min + 4 min level 4>6. Min cues for symmetrical movement R and L.  Pt returned to room and performed ambulatory transfer to bed with min assist from PT. Sit>supine completed with supervision assist, and left supine in bed with call bell in reach and all needs met.      PT Evaluation Precautions/Restrictions Precautions Precautions: Fall General   Vital Signs Pain Pain Assessment Pain Scale: 0-10 Pain Score: 0-No pain Home Living/Prior Functioning Home Living Available Help at Discharge: Family;Available PRN/intermittently Type of Home: House Home Access: Stairs to enter [employer] Steps: 5 in front and 10 steps in back Entrance Stairs-Rails: Can reach both(at back door ) Home Layout: One level Bathroom Shower/Tub: WMultimedia programmer Standard Bathroom Accessibility: Yes  Lives With: Spouse;Daughter Prior Function Level of Independence: Independent with transfers;Independent with homemaking with ambulation;Independent with gait  Able to Take Stairs?: Yes Driving: Yes Vocation: Retired Leisure: Hobbies-yes (Comment) Comments: train bird dogds  Vision/Perception  Perception Perception: Within Functional Limits Praxis Praxis: Intact  Cognition Overall Cognitive Status: Within Functional Limits for tasks assessed Arousal/Alertness: Awake/alert Orientation Level: Oriented X4 Focused Attention: Appears intact Sustained Attention: Appears intact Memory: Appears intact Awareness: Appears intact Problem  Solving: Appears intact Sensation Sensation Light Touch: Impaired Detail Light Touch Impaired Details: Impaired LUE(WFL in the LLE) Hot/Cold: Appears Intact Proprioception: Impaired Detail Proprioception Impaired Details: Impaired LUE;Impaired LLE Coordination Gross Motor Movements are Fluid and Coordinated: No Fine Motor Movements are Fluid and Coordinated: No Coordination and Movement Description: dysmetria  Finger Nose Finger Test: decreased coordination and  dexterity of L UE Heel Shin Test: dysmetric Motor  Motor Motor: Hemiplegia;Ataxia Motor - Skilled Clinical Observations: Lside Hemiplegia UE>LE  Mobility Bed Mobility Bed Mobility: Rolling Left;Rolling Right;Supine to Sit;Sit to Supine Rolling Right: Supervision/verbal cueing Rolling Left: Supervision/Verbal cueing Supine to Sit: Supervision/Verbal cueing Sit to Supine: Supervision/Verbal cueing Transfers Transfers: Sit to Stand;Stand Pivot Transfers Sit to Stand: Minimal Assistance - Patient > 75% Stand Pivot Transfers: Minimal Assistance - Patient > 75% Transfer (Assistive device): None Locomotion  Gait Ambulation: Yes Gait Assistance: Minimal Assistance - Patient > 75% Gait Distance (Feet): 150 Feet Assistive device: None Gait Gait: Yes Gait Pattern: Impaired Gait Pattern: Lateral hip instability;Poor foot clearance - left;Left flexed knee in stance Stairs / Additional Locomotion Stairs: Yes Stairs Assistance: Minimal Assistance - Patient > 75% Stair Management Technique: Two rails Number of Stairs: 12 Height of Stairs: 6 Wheelchair Mobility Wheelchair Mobility: Yes Wheelchair Assistance: Development worker, international aid: Both upper extremities Wheelchair Parts Management: Needs assistance Distance: 150  Trunk/Postural Assessment  Cervical Assessment Cervical Assessment: Within Functional Limits Thoracic Assessment Thoracic Assessment: Within Functional Limits Lumbar  Assessment Lumbar Assessment: Within Functional Limits Postural Control Postural Control: Deficits on evaluation(mild L lateral lean )  Balance Balance Balance Assessed: Yes Standardized Balance Assessment Standardized Balance Assessment: Berg Balance Test Berg Balance Test Sit to Stand: Able to stand without using hands and stabilize independently Standing Unsupported: Able to stand safely 2 minutes Sitting with Back Unsupported but Feet Supported on Floor or Stool: Able to sit safely and securely 2 minutes Stand to Sit: Sits safely with minimal use of hands Transfers: Able to transfer safely, definite need of hands Standing Unsupported with Eyes Closed: Able to stand 10 seconds with supervision Standing Ubsupported with Feet Together: Able to place feet together independently and stand for 1 minute with supervision From Standing, Reach Forward with Outstretched Arm: Can reach forward >12 cm safely (5") From Standing Position, Pick up Object from Floor: Able to pick up shoe, needs supervision From Standing Position, Turn to Look Behind Over each Shoulder: Looks behind one side only/other side shows less weight shift Turn 360 Degrees: Needs close supervision or verbal cueing Standing Unsupported, Alternately Place Feet on Step/Stool: Able to complete >2 steps/needs minimal assist Standing Unsupported, One Foot in Front: Able to take small step independently and hold 30 seconds Standing on One Leg: Able to lift leg independently and hold 5-10 seconds Total Score: 41 Dynamic Sitting Balance Dynamic Sitting - Level of Assistance: 5: Stand by assistance Static Standing Balance Static Standing - Level of Assistance: 5: Stand by assistance Dynamic Standing Balance Dynamic Standing - Level of Assistance: 4: Min assist Extremity Assessment      RLE Assessment RLE Assessment: Within Functional Limits LLE Assessment LLE Assessment: Exceptions to [facility] Strength Comments: 4/5 hip  flexion, hip abduction, hip extension. 5/5 knee flexion and extension with delayed activation     Refer to Care Plan for Long Term Goals  Recommendations for other services: Therapeutic Recreation  Stress management and Outing/community reintegration  Discharge Criteria: Patient will be discharged from PT if patient refuses treatment 3 consecutive times without medical reason, if treatment goals not met, if there is a change in medical status, if patient makes no progress towards goals or if patient is discharged from hospital.  The above assessment, treatment plan, treatment alternatives and goals were discussed and mutually agreed upon: by patient  Lorie Phenix 07/02/2018, 11:56 AM

## 2018-07-03 ENCOUNTER — Other Ambulatory Visit: Payer: Self-pay

## 2018-07-03 ENCOUNTER — Inpatient Hospital Stay (HOSPITAL_COMMUNITY): Payer: PPO | Admitting: Occupational Therapy

## 2018-07-03 ENCOUNTER — Inpatient Hospital Stay (HOSPITAL_COMMUNITY): Payer: PPO | Admitting: Physical Therapy

## 2018-07-03 ENCOUNTER — Inpatient Hospital Stay (HOSPITAL_COMMUNITY): Payer: PPO

## 2018-07-03 LAB — GLUCOSE, CAPILLARY
Glucose-Capillary: 183 mg/dL — ABNORMAL HIGH (ref 70–99)
Glucose-Capillary: 150 mg/dL — ABNORMAL HIGH (ref 70–99)
Glucose-Capillary: 236 mg/dL — ABNORMAL HIGH (ref 70–99)

## 2018-07-03 MED ORDER — GLIMEPIRIDE 2 MG PO TABS
1.0000 mg | ORAL_TABLET | Freq: Every day | ORAL | Status: DC
  Administered 2018-07-04: 08:00:00 1 mg via ORAL
  Filled 2018-07-03: qty 1

## 2018-07-03 MED ORDER — GLIMEPIRIDE 2 MG PO TABS: 1.0000 mg | ORAL_TABLET | Freq: Every day | ORAL | Status: DC

## 2018-07-03 NOTE — Progress Notes (Signed)
[facility] PHYSICAL MEDICINE & REHABILITATION PROGRESS NOTE   Subjective/Complaints:  Patient was seen in the gym.  We discussed his medications as well as his kidney function.  He had been on metformin in the past but with his CKD this is not a good choice.  He states that the glimepiride caused some low blood sugars at home.  We discussed that his activity level is not going to be as high since he has had a stroke and he is unlikely to bottom out but we would need to monitor  ROS- Negative CP, SOB, N/V/D                                                                                 Objective:   No results found. Recent Labs    07/02/18 0504  WBC 9.9  HGB 12.1*  HCT 34.8*  PLT 265   Recent Labs    07/02/18 0504  NA 134*  K 3.7  CL 99  CO2 23  GLUCOSE 179*  BUN 29*  CREATININE 1.70*  CALCIUM 9.4    Intake/Output Summary (Last 24 hours) at 07/03/2018 1006 Last data filed at 07/03/2018 0616 Gross per 24 hour  Intake 360 ml  Output 800 ml  Net -440 ml     Physical Exam: Vital Signs Blood pressure (!) 146/82, pulse 82, temperature 98.3 F (36.8 C), temperature source Oral, resp. rate 18, height 5\' 7"  (1.702 m), weight 72.1 kg, SpO2 96 %.   General: No acute distress Mood and affect are appropriate Heart: Regular rate and rhythm no rubs murmurs or extra sounds Lungs: Clear to auscultation, breathing unlabored, no rales or wheezes Abdomen: Positive bowel sounds, soft nontender to palpation, nondistended Extremities: No clubbing, cyanosis, or edema Skin: No evidence of breakdown, no evidence of rash Neurologic: Cranial nerves II through XII intact, motor strength is 5/5 in bilateral deltoid, bicep, tricep, grip, hip flexor, knee extensors, ankle dorsiflexor and plantar flexor Sensory exam normal sensation to light touch and proprioception in bilateral upper and lower extremities + 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 No joint swelling   Assessment/Plan: 1. Functional deficits secondary to RIght ICH which require 3+ hours per day of interdisciplinary therapy in a comprehensive inpatient rehab setting.  Physiatrist is providing close team supervision and 24 hour management of active medical problems listed below.  Physiatrist and rehab team continue to assess barriers to discharge/monitor patient progress toward functional and medical goals  Care Tool:  Bathing    Body parts bathed by patient: Right arm, Left arm, Chest, Abdomen, Front perineal area, Buttocks, Right upper leg, Left upper leg, Right lower leg, Left lower leg, Face         Bathing assist Assist Level: Supervision/Verbal cueing     Upper Body Dressing/Undressing Upper body dressing   What is the patient wearing?: Pull over shirt    Upper body assist Assist Level: Supervision/Verbal cueing    Lower Body Dressing/Undressing Lower body dressing      What is the patient wearing?: Underwear/pull  up, Pants     Lower body assist Assist for lower body dressing: Minimal Assistance - Patient > 75%     Toileting Toileting    Toileting assist Assist for toileting: Supervision/Verbal cueing     Transfers Chair/bed transfer  Transfers assist     Chair/bed transfer assist level: Contact Guard/Touching assist     Locomotion Ambulation   Ambulation assist      Assist level: Minimal Assistance - Patient > 75% Assistive device: Walker-rolling Max distance: 150   Walk 10 feet activity   Assist     Assist level: Minimal Assistance - Patient > 75%     Walk 50 feet activity   Assist    Assist level: Minimal Assistance - Patient > 75%      Walk 150 feet activity   Assist    Assist level: Minimal Assistance - Patient > 75%      Walk 10 feet on uneven surface  activity   Assist     Assist level:  Minimal Assistance - Patient > 75%     Wheelchair     Assist Will patient use wheelchair at discharge?: No Type of Wheelchair: Manual    Wheelchair assist level: Contact Guard/Touching assist Max wheelchair distance: 120    Wheelchair 50 feet with 2 turns activity    Assist        Assist Level: Contact Guard/Touching assist   Wheelchair 150 feet activity     Assist     Assist Level: Contact Guard/Touching assist      Medical Problem List and Plan: 1.  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            CIR PT, OT, SLP 2.  Antithrombotics: -DVT/anticoagulation:  SCDs             -antiplatelet therapy: N/A 3. Pain Management:  Tylenol as needed 4. Mood:  Provide emotional support             -antipsychotic agents: N/A 5. Neuropsych: This patient is capable of making decisions on his own behalf. 6. Skin/Wound Care:  Routine skin checks 7. Fluids/Electrolytes/Nutrition:  Routine in and out's with follow-up chemistries in the AM. 8. Hypertension. Norvasc 10 mg daily, clonidine 0.1 mg daily, chlorthalidone 25 mg daily, Cozaar 50 mg daily. Monitor with increased mobility.  Vitals:   07/02/18 1933 07/03/18 0545  BP: (!) 144/74 (!) 146/82  Pulse: 70 82  Resp: 18 18  Temp: 98.3 F (36.8 C) 98.3 F (36.8 C)  SpO2: 97% 96%  Patient took his amlodipine at night and his chlorthalidone and Cozaar in the morning my review of med list show that this is indeed what he is scheduled at the current time. We discussed that his blood pressure is in an acceptable range and that we would not want to further lower it until next week 9. Diabetes mellitus. Hemoglobin A1c 6.8. SSI. Patient on Amaryl 4 mg daily and metformin 500 mg twice a day prior to admission. Resume as needed. Monitor with increased mobility.  CBG (last 3)  Recent Labs    07/02/18 1742 07/02/18 2127 07/03/18 0615  GLUCAP 229* 189* 183*  would avoid  metformin due to CKD, start amaryl 1 mg every morning 10. Hyperlipidemia. Lipitor 11.  Sinus congestion flonase daily with prn ocean 12.  CKD Creat ~1.7 in Sept 2019 LOS: 2 days A FACE TO FACE EVALUATION WAS PERFORMED  Charlett Blake 07/03/2018, 10:06 AM

## 2018-07-03 NOTE — Progress Notes (Signed)
Occupational Therapy Session Note  Patient Details  Name: LAWRANCE WIEDEMANN MRN: [id number] Date of Birth: July 04, 1945  Today's Date: 07/03/2018 OT Individual Time: 9892-1194 and 1740-8144 OT Individual Time Calculation (min): 30 min and 60 min   Short Term Goals: Week 1:  OT Short Term Goal 1 (Week 1): STGs=LTGs  Skilled Therapeutic Interventions/Progress Updates:    1) Treatment session with focus on ADL retraining and dynamic standing balance.  Pt reports headache upon arrival, recently medicated.  Pt agreeable to shower, reporting it often helps with headache.  Pt ambulated to toilet with RW with CGA and completed toileting with supervision.  Transferred from toilet to tub bench in room shower with close supervision with RW.  Pt completed bathing at sit > stand level, standing for >75% of shower.  Supervision for bathing, pt demonstrating increased standing balance and tolerance.  Pt attempted to don underwear in standing, required min assist for standing balance while threading LLE.  Therapist educated on recommendation to sit for LB dressing.  Pt completed grooming tasks (shaving and oral care) in standing at sink with supervision.  Returned to sitting EOB and donned pants and socks at supervision level.  Pt left supine in bed with all needs in reach.  2) Treatment session with focus on LUE NMR and dynamic standing balance.  Pt received supine in bed reporting headache decreased to 3/10.  Ambulated to therapy gym >150' with RW and supervision.  Engaged in [place] in sitting with focus on fine motor control with grasp and release as well as manipulation of various sized items.  Pt demonstrating good use of LUE, however continues to demonstrate mild coordination impairments.  Engaged in pipe tree puzzle in standing with focus on functional use of BUE together while assembling.  Pt reports pleased with ability to utilize LUE and noting improvements in coordination.  Returned to room as above.  Once in  room, pt reports need to toilet.  Ambulated to toilet without AD with Min assist.  Pt completed toileting in standing with CGA and then returned to recliner with CGA for mobility.  Left upright in recliner with chair alarm on and all needs in reach.  Therapy Documentation Precautions:  Precautions Precautions: Fall Restrictions Weight Bearing Restrictions: No General:   Vital Signs: Therapy Vitals Temp: 98.3 F (36.8 C) Temp Source: Oral Pulse Rate: 82 Resp: 18 BP: (!) 146/82 Patient Position (if appropriate): Lying Oxygen Therapy SpO2: 96 % O2 Device: Room Air Pain: Pain Assessment Pain Scale: 0-10 Pain Score: 5  Pain Type: Acute pain Pain Location: Head Pain Orientation: Anterior Pain Descriptors / Indicators: Headache Pain Frequency: Intermittent Pain Onset: Gradual Patients Stated Pain Goal: 2 Pain Intervention(s): Medication (See eMAR)   Therapy/Group: Individual Therapy  Simonne Come 07/03/2018, 9:35 AM

## 2018-07-03 NOTE — Progress Notes (Addendum)
Physical Therapy Session Note  Patient Details  Name: Peter York MRN: [id number] Date of Birth: May 23, 1945  Today's Date: 07/03/2018 PT Individual Time: 1332-1415 PT Individual Time Calculation (min): 43 min   Short Term Goals: Week 1:  PT Short Term Goal 1 (Week 1): STG=LTG due to ELOS  Skilled Therapeutic Interventions/Progress Updates:     Patient in bed upon PT arrival. Patient alert and agreeable to PT session.  Therapeutic Activity: Bed Mobility: Patient performed supine to/from sit with supervision. Provided verbal cues for using UE's to push up. Transfers: Patient performed sit to/from stand x5 with out UE support x2, first trial 14.5 sec, second trial 10.9 seconds, and x10 without UE support x1 in 16.2 seconds with 5/10 RPE. Provided verbal cues for fully sitting and standing between stands. Patient performed sit to/from stand x4 with RW. All transfers were performed with supervision for safety.   Gait Training:  Patient ambulated 120 feet x2 using RW during first trial with supervision and without AD during second trial with CGA for safety and balance. Ambulated with decreased gait speed, decreased step length on L, increased B knee and hip flexion L>R, decreased arm swing and trunk rotation, downward head gaze. Provided verbal cues for looking ahead, erect posture, increasing gait speed and step length. Patient ambulated 10 feet going around 4 cones with CGA x2, stepping over 4 cones with L LE with min A x1 for LOB for 2 passes and stepping over 4 cones with R LE with CGA and no LOB for 2 passes.   Neuromuscular Re-ed: Patient performed step taps to small cones alternating feet with increased difficulty on the L with CGA to min A without UE support for 2 min. Also performed SLS with single UE support for up to 10 seconds 4-5 trials each side, static standing with 1 foot forward B x2 without UE support 30 seconds each, tandem stance x5 each sit up to 20 seconds with L foot forward  and 13 seconds with R foot forward without UE support. All performed with CGA for safety. Cues provided for improved activation of gluts and quads in L LE during activities.   Patient in bed at end of session with breaks locked, bed alarm set, and all needs within reach.   Therapy Documentation Precautions:  Precautions Precautions: Fall Restrictions Weight Bearing Restrictions: No Pain: Patient denied pain throughout session.  Therapy/Group: Individual Therapy  Doreene Burke, PT, DPT 07/03/2018, 3:33 PM

## 2018-07-03 NOTE — IPOC Note (Signed)
Overall Plan of Care [facility]) Patient Details Name: Peter [place] MRN: [id number] DOB: April 19, 1945  Admitting Diagnosis: <principal problem not specified>  Hospital Problems: Active Problems:   ICH (intracerebral hemorrhage) (HCC) hypertensive R Basal ganglia bleed     Functional Problem List: Nursing Medication Management, Skin Integrity  PT Balance, Edema, Endurance, Motor, Nutrition, Pain, Safety, Sensory  OT Balance, Cognition, Endurance, Motor, Pain, Safety  SLP    TR         Basic ADL's: OT Grooming, Bathing, Dressing, Toileting     Advanced  ADL's: OT       Transfers: PT Bed Mobility, Car, Bed to Chair, [employer], Futures trader, Metallurgist: PT Ambulation, Emergency planning/management officer, Stairs     Additional Impairments: OT    SLP        TR      Anticipated Outcomes Item Anticipated Outcome  Self Feeding n/a  Swallowing      Basic self-care  mod I   Toileting  mod I    Bathroom Transfers mod I - toilet and S- shower  Bowel/Bladder  mod I   Transfers  Mod I with LRAD   Locomotion  Md I with LRAD   Communication     Cognition     Pain  less than 3 out of 10  Safety/Judgment  mod I    Therapy Plan: PT Intensity: Minimum of 1-2 x/day ,45 to 90 minutes PT Frequency: 5 out of 7 days PT Duration Estimated Length of Stay: 7-10 days  OT Intensity: Minimum of 1-2 x/day, 45 to 90 minutes OT Frequency: 5 out of 7 days OT Duration/Estimated Length of Stay: 7 days      Team Interventions: Nursing Interventions Patient/Family Education, Medication Management, Skin Care/Wound Management, Discharge Planning  PT interventions Ambulation/gait training, Training and development officer, Community reintegration, Disease management/prevention, Discharge planning, DME/adaptive equipment instruction, Functional electrical stimulation, Functional mobility training, Neuromuscular re-education, Pain management, Patient/family education, Skin care/wound  management, Psychosocial support, Splinting/orthotics, Stair training, Therapeutic Exercise, UE/LE Coordination activities, Wheelchair propulsion/positioning, Visual/perceptual remediation/compensation, UE/LE Strength taining/ROM, Therapeutic Activities  OT Interventions Balance/vestibular training, Neuromuscular re-education, Self Care/advanced ADL retraining, Therapeutic Exercise, DME/adaptive equipment instruction, Pain management, Skin care/wound managment, UE/LE Strength taining/ROM, Community reintegration, Barrister's clerk education, Splinting/orthotics, UE/LE Coordination activities, Discharge planning, Functional mobility training, Psychosocial support, Therapeutic Activities  SLP Interventions    TR Interventions    SW/CM Interventions Discharge Planning, Psychosocial Support, Patient/Family Education   Barriers to Discharge MD  Lack of/limited family support  Nursing      PT Decreased caregiver support, Home environment access/layout    OT Other (comments) none known at this time  SLP      SW       Team Discharge Planning: Destination: PT-Home ,OT- Home , SLP-  Projected Follow-up: PT-Home health PT, OT-  Home health OT, SLP-  Projected Equipment Needs: PT-To be determined, OT- To be determined, SLP-  Equipment Details: PT- , OT-  Patient/family involved in discharge planning: PT-  ,  OT-Patient, SLP-   MD ELOS: 7d Medical Rehab Prognosis:  Excellent Assessment:  73 year old right-handed male history of hypertension maintained on Norvasc 10 mg daily, clonidine 0.1 mg daily, chlorthalidone 25 mg daily,Cozaar 50 mg daily diabetes mellitus maintained on Amaryl 4 mg daily and metformin 500 mg twice a day, hyperlipidemia.Per chart review and patient, patient lives with spouse. Independent and very active.One level home with 5 steps to entry. Wife is a Psychiatric nurse. They have a daughter that  can also assist. Presented 06/29/2018 to [facility] hospital with left side weakness and balance  deficis. Blood pressure 170/82. CT of the head showed a 15 mm acute hematoma right basal ganglia internal capsule. CT angiogram of the head showed no large or medium vessel occlusion. 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 Patient was transferred to [facility] for further evaluation. Maintained on Cardene drip for blood pressure control. Follow-up MRI 06/30/2018 showed small hematoma right basal ganglia without evidence of underlying lesion. No evidence of amyloid angiopathy. Neurology follow-up noted blood pressure control Cardene drip later discontinued. Patient is tolerating a regular diet   Now requiring 24/7 Rehab RN,MD, as well as CIR level PT, OT and SLP.  Treatment team will focus on ADLs and mobility with goals set at Mod I See Team Conference Notes for weekly updates to the plan of care

## 2018-07-03 NOTE — Progress Notes (Signed)
Physical Therapy Session Note  Patient Details  Name: Peter York MRN: [id number] Date of Birth: 07-30-1945  Today's Date: 07/03/2018 PT Individual Time: 1115-1227 AND 1645-1730 PT Individual Time Calculation (min): 72 min and 45 min   Short Term Goals: Week 1:  PT Short Term Goal 1 (Week 1): STG=LTG due to ELOS  Skilled Therapeutic Interventions/Progress Updates:   Pt received sitting in [place] and agreeable to PT  Gait training with RW to rehab and from gym 2x 285f supervision assist from PT for safety. intermittent foot drag on the L, but improved from previous treatment. PT also instructed pt in gait training without AD x 1528fand supervision assist/CGA for safety in turns as well as improved UE/trunk rotation to improve core activation.    PT instructed pt in dynamic balance training. Standing on Airex pad while completing Moderate difficulty peg board puzzle; supervision assist for balance, mild increased time for problem solving. PT instructed pt in DGI. See below for results. Demonstrates increased fall risk with score of 12/24. (<19 indicates increased fall risk)    dynamic gait training to perform forward/retrogrdae gait training 3 x 1560fach. Side stepping R and L 3 x 10 Bil. 4 square: forward/bacward x 8 each. R/L x 8 each. Clockwise/counterclockwise x 4 each. Min assist from PT for improved upright posture and pelvic rotation on the L to normalize movements. Gait  Over unlevel surface [id number]. Min assist- CGA throughout dynamic gait training for improved posture, weight shifting, and to prevent L LOB intermittently with turns and stepping onto unlevel mat.   [facility] returned to room and [facility] and all needs met.     Session  2  Pt received supine in bed, Asleep, but unable to arouse. PT returned in 30mi15md found pt asleep in bed. Pt easily aroused and agreeable to PT. Supine>sit transfer with supervision assist and min cues for awareness  of LUE position for safety. Pt reports need for urination. Min assist ambulatory to toilet, poor attention to the LLE.   Gait training with RW to day room x 120ft 6fpervision assist from PT with min cues initially for improved step height  And heel contact on the L. PT instructed pt in dynamic standing balance while engaged in will bowling x [id number] wii boxing x 8 minutes. Supervision assist overall with min cues for weight shifting and use of ankle strategy to prevent posterior LOB.   Nustep reciprocal endurance training x 8 minutes, level 5>6. Min cues initially for proper steps per minute to prevent early fatigue, and equal step length on the L.   Patient returned to room and left sitting up in bed with call bell in reach and all needs met.         Therapy Documentation Precautions:  Precautions Precautions: Fall Restrictions Weight Bearing Restrictions: No Pain: Pain Assessment Pain Score: 3      Balance: Standardized Balance Assessment Standardized Balance Assessment: Dynamic Gait Index Dynamic Gait Index Level Surface: Mild Impairment Change in Gait Speed: Moderate Impairment Gait with Horizontal Head Turns: Moderate Impairment Gait with Vertical Head Turns: Mild Impairment Gait and Pivot Turn: Mild Impairment Step Over Obstacle: Moderate Impairment Step Around Obstacles: Moderate Impairment Steps: Mild Impairment Total Score: 12    Therapy/Group: Individual Therapy  Peter York Peter [place]ix20, 12:31 PM

## 2018-07-04 ENCOUNTER — Inpatient Hospital Stay (HOSPITAL_COMMUNITY): Payer: PPO | Admitting: Physical Therapy

## 2018-07-04 ENCOUNTER — Inpatient Hospital Stay (HOSPITAL_COMMUNITY): Payer: PPO | Admitting: Occupational Therapy

## 2018-07-04 LAB — GLUCOSE, CAPILLARY
Glucose-Capillary: 163 mg/dL — ABNORMAL HIGH (ref 70–99)
Glucose-Capillary: 196 mg/dL — ABNORMAL HIGH (ref 70–99)
Glucose-Capillary: 272 mg/dL — ABNORMAL HIGH (ref 70–99)
Glucose-Capillary: 185 mg/dL — ABNORMAL HIGH (ref 70–99)
Glucose-Capillary: 111 mg/dL — ABNORMAL HIGH (ref 70–99)
Glucose-Capillary: 163 mg/dL — ABNORMAL HIGH (ref 70–99)

## 2018-07-04 MED ORDER — LORATADINE 5 MG/5ML PO SYRP
5.0000 mg | ORAL_SOLUTION | Freq: Every day | ORAL | Status: DC | PRN
  Administered 2018-07-04 – 2018-07-08 (×5): 5 mg via ORAL
  Filled 2018-07-04 (×8): qty 5

## 2018-07-04 MED ORDER — SALINE SPRAY 0.65 % NA SOLN
2.0000 | Freq: Three times a day (TID) | NASAL | Status: DC
  Administered 2018-07-04 – 2018-07-09 (×20): 2 via NASAL
  Filled 2018-07-04: qty 44

## 2018-07-04 MED ORDER — GLIMEPIRIDE 2 MG PO TABS
2.0000 mg | ORAL_TABLET | Freq: Every day | ORAL | Status: DC
  Administered 2018-07-05 – 2018-07-09 (×5): 2 mg via ORAL
  Filled 2018-07-04 (×5): qty 1

## 2018-07-04 NOTE — Progress Notes (Signed)
Physical Therapy Session Note  Patient Details  Name: Peter York MRN: [id number] Date of Birth: 19-Oct-1945  Today's Date: 07/04/2018 PT Individual Time: 0930-1030 PT Individual Time Calculation (min): 60 min   Short Term Goals: Week 1:  PT Short Term Goal 1 (Week 1): STG=LTG due to ELOS  Skilled Therapeutic Interventions/Progress Updates:    Patient received in bed, very pleasant and willing to participate in PT today. Continued gait training without assistive device multiple distances of 276f with min guard, improved balance noted without external support today however continue to note significant weakness L LE during stance phase and reduced proprioception during swing. Also incorporated B LE strengthening/reciprocal pattern training for gait on Nustep with B LEs on resistance 4 today. Otherwise continued working on functional balance task on steady and unsteady surfaces including tandem stance, single leg stance, cross midline cone tapping, backwards walking, and sidestepping requiring up to MinA to maintain balance. He was left up in recliner with all needs met, chair alarm active.   Therapy Documentation Precautions:  Precautions Precautions: Fall Restrictions Weight Bearing Restrictions: No General:   Vital Signs:   Pain: Pain Assessment Pain Scale: 0-10 Pain Score: 0-No pain    Therapy/Group: Individual Therapy  KDeniece ReePT, DPT, CBIS  Supplemental Physical Therapist [facility]   Pager [phone] Rehab Office [phone]  07/04/2018, 12:48 PM

## 2018-07-04 NOTE — Progress Notes (Signed)
[place] PHYSICAL MEDICINE & REHABILITATION PROGRESS NOTE   Subjective/Complaints:  Stuffy nose with some pressure feeling left forehead, no nasal drainage no sweats or chill, just started flonase  ROS- Negative CP, SOB, N/V/D                                                                                 Objective:   No results found. Recent Labs    07/02/18 0504  WBC 9.9  HGB 12.1*  HCT 34.8*  PLT 265   Recent Labs    07/02/18 0504  NA 134*  K 3.7  CL 99  CO2 23  GLUCOSE 179*  BUN 29*  CREATININE 1.70*  CALCIUM 9.4    Intake/Output Summary (Last 24 hours) at 07/04/2018 1043 Last data filed at 07/04/2018 0832 Gross per 24 hour  Intake 840 ml  Output 1050 ml  Net -210 ml     Physical Exam: Vital Signs Blood pressure 137/79, pulse 69, temperature 98.5 F (36.9 C), temperature source Oral, resp. rate 17, height 5\' 7"  (1.702 m), weight 72.1 kg, SpO2 98 %.   General: No acute distress Mood and affect are appropriate Heart: Regular rate and rhythm no rubs murmurs or extra sounds Lungs: Clear to auscultation, breathing unlabored, no rales or wheezes Abdomen: Positive bowel sounds, soft nontender to palpation, nondistended Extremities: No clubbing, cyanosis, or edema Skin: No evidence of breakdown, no evidence of rash Neurologic: Cranial nerves II through XII intact, motor strength is 5/5 in bilateral deltoid, bicep, tricep, grip, hip flexor, knee extensors, ankle dorsiflexor and plantar flexor Sensory exam normal sensation to light touch and proprioception in bilateral upper and lower extremities + 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 No joint swelling   Assessment/Plan: 1. Functional deficits secondary to RIght ICH which require 3+ hours per day of interdisciplinary therapy in a comprehensive inpatient rehab  setting.  Physiatrist is providing close team supervision and 24 hour management of active medical problems listed below.  Physiatrist and rehab team continue to assess barriers to discharge/monitor patient progress toward functional and medical goals  Care Tool:  Bathing    Body parts bathed by patient: Right arm, Left arm, Chest, Abdomen, Front perineal area, Buttocks, Right upper leg, Left upper leg, Right lower leg, Left lower leg, Face         Bathing assist Assist Level: Supervision/Verbal cueing     Upper Body Dressing/Undressing Upper body dressing   What is the patient wearing?: Pull over shirt    Upper body assist Assist Level: Supervision/Verbal cueing    Lower Body Dressing/Undressing Lower body dressing      What is the patient wearing?: Underwear/pull up, Pants     Lower body assist Assist for lower body dressing: Minimal Assistance - Patient > 75%     Toileting Toileting    Toileting assist Assist for toileting: Supervision/Verbal cueing     Transfers Chair/bed transfer  Transfers assist     Chair/bed transfer assist level: Supervision/Verbal cueing     Locomotion Ambulation   Ambulation  assist      Assist level: Supervision/Verbal cueing Assistive device: Walker-rolling Max distance: 150   Walk 10 feet activity   Assist     Assist level: Supervision/Verbal cueing Assistive device: Walker-rolling   Walk 50 feet activity   Assist    Assist level: Supervision/Verbal cueing Assistive device: Walker-rolling    Walk 150 feet activity   Assist    Assist level: Supervision/Verbal cueing Assistive device: Walker-rolling    Walk 10 feet on uneven surface  activity   Assist     Assist level: Minimal Assistance - Patient > 75%     Wheelchair     Assist Will patient use wheelchair at discharge?: No Type of Wheelchair: Manual    Wheelchair assist level: Contact Guard/Touching assist Max wheelchair distance:  120    Wheelchair 50 feet with 2 turns activity    Assist        Assist Level: Contact Guard/Touching assist   Wheelchair 150 feet activity     Assist     Assist Level: Contact Guard/Touching assist      Medical Problem List and Plan: 1.  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            CIR PT, OT, SLP 2.  Antithrombotics: -DVT/anticoagulation:  SCDs             -antiplatelet therapy: N/A 3. Pain Management:  Tylenol as needed 4. Mood:  Provide emotional support             -antipsychotic agents: N/A 5. Neuropsych: This patient is capable of making decisions on his own behalf. 6. Skin/Wound Care:  Routine skin checks 7. Fluids/Electrolytes/Nutrition:  Routine in and out's with follow-up chemistries in the AM. 8. Hypertension. Norvasc 10 mg daily, clonidine 0.1 mg daily, chlorthalidone 25 mg daily, Cozaar 50 mg daily. Monitor with increased mobility.  Vitals:   07/03/18 2116 07/04/18 0600  BP: 128/75 137/79  Pulse:  69  Resp:  17  Temp:  98.5 F (36.9 C)  SpO2:  98%  Patient took his amlodipine at night and his chlorthalidone and Cozaar in the morning my review of med list show that this is indeed what he is scheduled at the current time. We discussed that his blood pressure is in an acceptable range and that we would not want to further lower it until next week 9. Diabetes mellitus. Hemoglobin A1c 6.8. SSI. Patient on Amaryl 4 mg daily and metformin 500 mg twice a day prior to admission. Resume as needed. Monitor with increased mobility.  CBG (last 3)  Recent Labs    07/03/18 2109 07/04/18 0631 07/04/18 0747  GLUCAP 236* 196* 272*  would avoid metformin due to CKD, increase amaryl to 2 mg every morning Cont SSI 10. Hyperlipidemia. Lipitor 11.  Sinus congestion flonase daily with prn ocean, add claritin 12.  CKD Creat ~1.7 in Sept 2019, looks stable LOS: 3 days A FACE TO FACE EVALUATION WAS  PERFORMED  Charlett Blake 07/04/2018, 10:43 AM

## 2018-07-04 NOTE — Progress Notes (Signed)
Physical Therapy Session Note  Patient Details  Name: Peter York MRN: [id number] Date of Birth: 1946-02-27  Today's Date: 07/04/2018 PT Individual Time: 1115-1230 PT Individual Time Calculation (min): 75 min   Short Term Goals: Week 1:  PT Short Term Goal 1 (Week 1): STG=LTG due to ELOS  Skilled Therapeutic Interventions/Progress Updates:   Pt received sitting in WC and agreeable to PT. Gait training with RW x 287f with supervision assist and min cues for improved step height with fatigue and AD management in turns. Additional gait training with Rollator x [id number] supervision assist from PT for AD parts management as well as step height L intermittently. Significant improvement with safety in turns with rollator. Dynamic gait training to weave through 4 cones x 4 with supervision assist from PT and only min cues for safety.   PT instructed pt in dynamic balance training:  Standing on blue wedge to toss 8 bean bags to target x 4. Supervision assist from PT with cues and ankle strategy to prevent posterior LOB.  SLS to tap BLE on 1 cone with UE support x 2, no UE support x 8 with min assist. Tapping to 2 cones with no UE support x 10, tapping on  1 of 3 cones 2 x 10 with min assist from PT. Moderate cues for improved weight shifting and L side glute activation to improve stability.  Wii fit balance board, penguin slide x 2 and table tilt x 1. Min assist throughout with moderate cues for ankle strategy for weight shifting and improved extension in the LLE to increase weight shift.   Pt returned to room and performed ambulatory transfer to bed with supervision assist and Rollator. Sit>supine completed without assist and left supine in bed with call bell in reach and all needs met.        Therapy Documentation Precautions:  Precautions Precautions: Fall Restrictions Weight Bearing Restrictions: No Pain: Pain Assessment Pain Scale: 0-10 Pain Score: 4      Therapy/Group: Individual Therapy  AustLorie Phenix0/2020, 12:43 PM

## 2018-07-04 NOTE — Progress Notes (Signed)
Occupational Therapy Session Note  Patient Details  Name: Peter York MRN: [id number] Date of Birth: October 30, 1945  Today's Date: 07/04/2018 OT Individual Time: 1335-1446 OT Individual Time Calculation (min): 71 min   Short Term Goals: Week 1:  OT Short Term Goal 1 (Week 1): STGs=LTGs  Skilled Therapeutic Interventions/Progress Updates:    Pt greeted in bed with no c/o pain. Wanting to shower and shave. He completed bathing (standing in shower), dressing (sit<stand from standard toilet using rollator), shaving + oral care (standing at sink), bedmaking (ambulating around bed without device), and ambulation around unit, and to [employer] during session. All functional transfers completed at ambulatory level using rollator with supervision and max vcs for DME mgt. During LB dressing he needed vcs to sit vs stand to increase safety as well. He was mindful about increasing functional use of L UE for NMR, but still required min vcs. Worked on higher level balance and B UE coordination while stripping bed and remaking it with new linen. He ambulated down hallway using device to gather this linen for room with supervision. B UE strengthening when applying fitted sheet. He was motivated to walk after, and ambulated approx 1790 ft down hallways, including to [employer]. Min vcs for Lt foot clearance at this time. When he returned to room, pt transferred in and out of bed (without bedrails, bed raised to similar height as the one at home) with setup. Pt opted to remain in bed, and was left with all needs within reach and bed alarm set.   Therapy Documentation Precautions:  Precautions Precautions: Fall Restrictions Weight Bearing Restrictions: No Vital Signs: Therapy Vitals Temp: 97.7 F (36.5 C) Temp Source: Oral Pulse Rate: 68 Resp: 18 BP: 126/75 Patient Position (if appropriate): Sitting Oxygen Therapy SpO2: 98 % O2 Device: Room Air Pain: No s/s pain during tx  Pain Assessment Pain  Scale: 0-10 Pain Score: 0-No pain ADL:       Therapy/Group: Individual Therapy  Cj Beecher A Catriona Dillenbeck 07/04/2018, 4:45 PM

## 2018-07-05 ENCOUNTER — Inpatient Hospital Stay (HOSPITAL_COMMUNITY): Payer: PPO | Admitting: Occupational Therapy

## 2018-07-05 ENCOUNTER — Inpatient Hospital Stay (HOSPITAL_COMMUNITY): Payer: PPO | Admitting: Physical Therapy

## 2018-07-05 DIAGNOSIS — E1159 Type 2 diabetes mellitus with other circulatory complications: Secondary | ICD-10-CM

## 2018-07-05 LAB — GLUCOSE, CAPILLARY
Glucose-Capillary: 173 mg/dL — ABNORMAL HIGH (ref 70–99)
Glucose-Capillary: 154 mg/dL — ABNORMAL HIGH (ref 70–99)
Glucose-Capillary: 167 mg/dL — ABNORMAL HIGH (ref 70–99)
Glucose-Capillary: 226 mg/dL — ABNORMAL HIGH (ref 70–99)

## 2018-07-05 NOTE — Progress Notes (Signed)
Occupational Therapy Session Note  Patient Details  Name: ULYESS MUTO MRN: [id number] Date of Birth: 11-May-1945  Today's Date: 07/05/2018 OT Individual Time: 0459-9774 OT Individual Time Calculation (min): 28 min   Short Term Goals: Week 1:  OT Short Term Goal 1 (Week 1): STGs=LTGs  Skilled Therapeutic Interventions/Progress Updates:    Pt greeted in bed, finishing up lunch. Amenable to tx. Started session with ambulating to dresser to put away clothes. Vcs required for rollator mgt and positioning during task. Pt incorporating Lt to reach and sort clothing, stooping to 2nd drawer with supervision. Toileting completed at ambulatory level using rollator, once again vcs required for safe device mgt. He put on shoes with vcs to sit vs stand for task, and then ambulated with device down hallway and back approx 235 ft in total with supervision assist. Pt left in bed with all needs within reach and bed alarm set. Tx focus placed on Lt NMR, functional ambulation, balance, and DME safety.   Therapy Documentation Precautions:  Precautions Precautions: Fall Restrictions Weight Bearing Restrictions: No Vital Signs: Therapy Vitals Temp: 99.4 F (37.4 C) Temp Source: Oral Pulse Rate: 68 Resp: 19 BP: 116/77 Patient Position (if appropriate): Lying Oxygen Therapy SpO2: 96 % O2 Device: Room Air Pain: No c/o pain during tx   ADL:       Therapy/Group: Individual Therapy  Shadi Sessler A Lakisha Peyser 07/05/2018, 3:52 PM

## 2018-07-05 NOTE — Progress Notes (Signed)
Physical Therapy Session Note  Patient Details  Name: Peter York MRN: [id number] Date of Birth: 12-30-1945  Today's Date: 07/05/2018 PT Individual Time: 1400-1500 PT Individual Time Calculation (min): 60 min   Short Term Goals: Week 1:  PT Short Term Goal 1 (Week 1): STG=LTG due to ELOS  Skilled Therapeutic Interventions/Progress Updates:   Pt received supine in bed and agreeable to PT. Supine>sit transfer without assist or cues. Sit<>stand from EOB with supervision assist from PT.   Gait training with rollator 2 x 253f with supervision assist from PT for safety. Improved step length, height and weight shifting noted on this day.   Sit>Supine without assist from PT. SupineNMR: SAQ, Bridges, SLR, hip abduction, heel slides, PNF lumbar rotaion stabilizing reversals 2 x 5 with 6 sec hold. Trunk rotation with 4kg weight ball. Min-mod cues for full ROM, improved L glute activation, improved control with eccentric component and decreased compensations.   Seated rotation R and L with 4kg weighted ball cues for postural alignment and improve L side activation. PT instructed pt in otago level B balance program with hand out provided: forward/back gait. Side stepping, figure 8, sit<>stand, SLS, tandem stance, mini squats Supervision assist with min cues for proper UE support as needed and use of hip strategy to prevent L lateral LOB with SLS.    Pt returned to room and performed ambulatory transfer to bed with supervision assist and rollator. Sit>supine completed without assist, and left supine in bed with call bell in reach and all needs met.        Therapy Documentation Precautions:  Precautions Precautions: Fall Restrictions Weight Bearing Restrictions: No    Vital Signs: Therapy Vitals Temp: 99.4 F (37.4 C) Temp Source: Oral Pulse Rate: 68 Resp: 19 BP: 116/77 Patient Position (if appropriate): Lying Oxygen Therapy SpO2: 96 % O2 Device: Room  Air Pain: denies   Therapy/Group: Individual Therapy  ALorie Phenix4/01/2019, 3:35 PM

## 2018-07-05 NOTE — Progress Notes (Signed)
Occupational Therapy Session Note  Patient Details  Name: Peter York MRN: [id number] Date of Birth: [birth year]  Today's Date: 07/05/2018 OT Individual Time: 0930-1040 and 1130-1200 OT Individual Time Calculation (min): 70 min and 30 min    Short Term Goals: Week 1:  OT Short Term Goal 1 (Week 1): STGs=LTGs  Skilled Therapeutic Interventions/Progress Updates:    Session 1: Upon entering the room, pt supine in bed with no c/o pain and requesting to shower. Pt performed bed mobility with supervision and ambulating with rollator in room to obtain all needed items to shower and dress. Pt standing to doff clothing items with close supervision for safety and remained standing during entire shower with supervision overall and no LOB. Pt sitting on rollator chair to fully dry self and don LB clothing with sit <>stand. Pt standing at sink for all grooming tasks with use of bilateral coordination to complete with supervision. Pt ambulating without use of rollator 150' x 2 reps with close supervision and min cuing to swing arms and forward gaze. Pt taking seated rest break. Pt standing at table for card task with focus on use of L UE to deal cards, flip cards, and palmar translation tasks. Pt returning to room at end of session and seated on bed. Call bell and all needed items within reach upon exiting the room.   Session 2: Upon entering the room, pt seated on EOB awaiting therapist. OT provided pt with paper handout for theraputty HEP. OT demonstrated and pt returning demonstrations for exercises 1 -23 on printed sheet with min verbal cuing for proper technique with use of red resistive putty. Pt remaining in bed with NT present to check blood glucose. Call bell and all needed items within reach upon exiting the room.   Therapy Documentation Precautions:  Precautions Precautions: Fall Restrictions Weight Bearing Restrictions: No   Therapy/Group: Individual Therapy  Gypsy Decant 07/05/2018,  10:46 AM

## 2018-07-05 NOTE — Progress Notes (Signed)
Peter York is a 74 y.o. male Jan 03, 1946 [id number]  Subjective: No new complaints. No new problems. Slept well. Feeling OK.  Objective: Vital signs in last 24 hours: Temp:  [97.7 F (36.5 C)-98.9 F (37.2 C)] 98.8 F (37.1 C) (04/11 0500) Pulse Rate:  [62-71] 62 (04/11 0500) Resp:  [14-18] 14 (04/11 0500) BP: (126-138)/(75-76) 132/76 (04/11 0500) SpO2:  [96 %-99 %] 99 % (04/11 0500) Weight change:  Last BM Date: 07/04/18  Intake/Output from previous day: 04/10 0701 - 04/11 0700 In: 240 [P.O.:240] Out: 700 [Urine:700] Last cbgs: CBG (last 3)  Recent Labs    07/04/18 1702 07/04/18 2120 07/05/18 0625  GLUCAP 111* 163* 173*     Physical Exam General: No apparent distress   HEENT: not dry Lungs: Normal effort. Lungs clear to auscultation, no crackles or wheezes. Cardiovascular: Regular rate and rhythm, no edema Abdomen: S/NT/ND; BS(+) Musculoskeletal:  unchanged Neurological: No new neurological deficits Wounds: N/A    Skin: clear  Aging changes Mental state: Alert, oriented, cooperative    Lab Results: BMET    Component Value Date/Time   NA 134 (L) 07/02/2018 0504   NA 138 03/17/2018 1512   K 3.7 07/02/2018 0504   CL 99 07/02/2018 0504   CO2 23 07/02/2018 0504   GLUCOSE 179 (H) 07/02/2018 0504   BUN 29 (H) 07/02/2018 0504   BUN 28 (H) 03/17/2018 1512   CREATININE 1.70 (H) 07/02/2018 0504   CREATININE 1.28 (H) 05/16/2015 0918   CALCIUM 9.4 07/02/2018 0504   GFRNONAA 39 (L) 07/02/2018 0504   GFRNONAA 54 (L) 10/02/2012 0834   GFRAA 45 (L) 07/02/2018 0504   GFRAA 63 10/02/2012 0834   CBC    Component Value Date/Time   WBC 9.9 07/02/2018 0504   RBC 3.81 (L) 07/02/2018 0504   HGB 12.1 (L) 07/02/2018 0504   HGB WILL FOLLOW 03/17/2018 1512   HCT 34.8 (L) 07/02/2018 0504   HCT WILL FOLLOW 03/17/2018 1512   PLT 265 07/02/2018 0504   PLT WILL FOLLOW 03/17/2018 1512   MCV 91.3 07/02/2018 0504   MCV WILL FOLLOW 03/17/2018 1512   MCH 31.8 07/02/2018  0504   MCHC 34.8 07/02/2018 0504   RDW 12.7 07/02/2018 0504   RDW WILL FOLLOW 03/17/2018 1512   LYMPHSABS 1.2 07/02/2018 0504   LYMPHSABS WILL FOLLOW 03/17/2018 1512   MONOABS 0.8 07/02/2018 0504   EOSABS 0.3 07/02/2018 0504   EOSABS WILL FOLLOW 03/17/2018 1512   BASOSABS 0.1 07/02/2018 0504   BASOSABS WILL FOLLOW 03/17/2018 1512    Studies/Results: No results found.  Medications: I have reviewed the patient's current medications.  Assessment/Plan:  1.  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  Continue with CIR 2.  DVT prophylaxis with SCDs 3.  Emotional support 4.  Pain management with PRN Tylenol 5.  Hypertension.  Norvasc, clonidine, chlorthalidone, Cozaar 6.  Type 2 diabetes.  Sliding scale insulin.  Continue on Amaryl and metformin 7.  Dyslipidemia on Lipitor 8.  Rhinitis.  On Flonase and Claritin 9.  Chronic kidney disease - creatinine stable     Length of stay, days: 4  Walker Kehr , MD 07/05/2018, 11:36 AM

## 2018-07-06 ENCOUNTER — Inpatient Hospital Stay (HOSPITAL_COMMUNITY): Payer: PPO | Admitting: Occupational Therapy

## 2018-07-06 LAB — GLUCOSE, CAPILLARY
Glucose-Capillary: 159 mg/dL — ABNORMAL HIGH (ref 70–99)
Glucose-Capillary: 126 mg/dL — ABNORMAL HIGH (ref 70–99)
Glucose-Capillary: 103 mg/dL — ABNORMAL HIGH (ref 70–99)
Glucose-Capillary: 262 mg/dL — ABNORMAL HIGH (ref 70–99)

## 2018-07-06 NOTE — Progress Notes (Signed)
Occupational Therapy Session Note  Patient Details  Name: LATRAVIOUS LEVITT MRN: [id number] Date of Birth: 04/15/1945  Today's Date: 07/06/2018 OT Individual Time: 0017-4944 [id number]   OT Individual Time Calculation (min): 63 min and 42 min    Short Term Goals: Week 1:  OT Short Term Goal 1 (Week 1): STGs=LTGs  Skilled Therapeutic Interventions/Progress Updates:    Pt greeted in bed with no c/o pain. Wanting to get up to shower. During session, he completed bathing (at shower level, standing), dressing (sit<stand from rollator seat), toileting x2 (bladder void, standing near standard toilet), oral care/grooming tasks (standing at sink), simulated walk in shower transfer with ledge, and stair ascending/descending during session. All functional transfers completed at ambulatory level using rollator with vcs for locking brakes when appropriate, and safe positioning during activity. Distant supervision for 2nd toilet transfer with pt remembering to lock w/c brakes when stopping! He required vcs to sit vs stand during LB portions of ADLs for safety. Pt able to stoop to floor to retrieve dropped clothing when gathering clothes in drawers. Able to retrieve dropped towels from floor without LOB as well. Distant supervision for standing balance during self care tasks at sink while he actively incorporated L UE. He then ambulated with rollator to therapy apartment. Discussed technique for walk in shower transfers. He reports he has a built in seat at home. We simulated his home setup, with pt completing transfers with supervision using rollator, both back stepping and sidestepping over small ledge. Pt also ascended and descended 12 stairs in total with unilateral support on rail and vcs for slowing pace. When pt ambulated back to room, he returned to bed and was left with all needs within reach and bed alarm set.   2nd Session 1:1 tx (42 min) Pt greeted in bed with no c/o pain. Motivated to go outside. He was  escorted to outdoor courtyard via w/c. Worked on functional ambulation with device in community setting. He used the rollator while navigating over uneven terrain (+500 ft) and transferring to community bench .Vcs required for Lt foot clearance. While seated, we reviewed his B UE HEP using green theraband with paper handout. Pt plans to be very active at home, reports he will be careful not to overdo it. Grateful of the HEPs he's received from both OT and PT disciplines. Pt ambulated back to unit with supervision using rollator. Pt transferred back to bed and was left with all needs within reach and bed alarm set.   Therapy Documentation Precautions:  Precautions Precautions: Fall Restrictions Weight Bearing Restrictions: No Vital Signs: Therapy Vitals Temp: 99 F (37.2 C) Temp Source: Oral Pulse Rate: (!) 59 Resp: 19 BP: 122/67 Patient Position (if appropriate): Lying Oxygen Therapy SpO2: 98 % O2 Device: Room Air ADL:        Therapy/Group: Individual Therapy  Lavon Horn A Necha Harries 07/06/2018, 4:48 PM

## 2018-07-06 NOTE — Progress Notes (Signed)
Peter York is a 73 y.o. male May 27, 1945 [id number]  Subjective: No new complaints. No new problems. Slept well. Feeling OK.  Objective: Vital signs in last 24 hours: Temp:  [98.5 F (36.9 C)-99.4 F (37.4 C)] 98.5 F (36.9 C) (04/12 0434) Pulse Rate:  [63-68] 63 (04/12 0434) Resp:  [15-19] 18 (04/12 0434) BP: (112-132)/(64-77) 132/74 (04/12 0434) SpO2:  [95 %-96 %] 96 % (04/12 0434) Weight change:  Last BM Date: 07/06/18  Intake/Output from previous day: 04/11 0701 - 04/12 0700 In: 720 [P.O.:720] Out: 750 [Urine:750] Last cbgs: CBG (last 3)  Recent Labs    07/05/18 2114 07/06/18 0611 07/06/18 1153  GLUCAP 226* 159* 126*     Physical Exam General: No apparent distress   HEENT: not dry Lungs: Normal effort. Lungs clear to auscultation, no crackles or wheezes. Cardiovascular: Regular rate and rhythm, no edema Abdomen: S/NT/ND; BS(+) Musculoskeletal:  unchanged Neurological: No new neurological deficits Wounds: N/A    Skin: clear  Aging changes Mental state: Alert, oriented, cooperative    Lab Results: BMET    Component Value Date/Time   NA 134 (L) 07/02/2018 0504   NA 138 03/17/2018 1512   K 3.7 07/02/2018 0504   CL 99 07/02/2018 0504   CO2 23 07/02/2018 0504   GLUCOSE 179 (H) 07/02/2018 0504   BUN 29 (H) 07/02/2018 0504   BUN 28 (H) 03/17/2018 1512   CREATININE 1.70 (H) 07/02/2018 0504   CREATININE 1.28 (H) 05/16/2015 0918   CALCIUM 9.4 07/02/2018 0504   GFRNONAA 39 (L) 07/02/2018 0504   GFRNONAA 54 (L) 10/02/2012 0834   GFRAA 45 (L) 07/02/2018 0504   GFRAA 63 10/02/2012 0834   CBC    Component Value Date/Time   WBC 9.9 07/02/2018 0504   RBC 3.81 (L) 07/02/2018 0504   HGB 12.1 (L) 07/02/2018 0504   HGB WILL FOLLOW 03/17/2018 1512   HCT 34.8 (L) 07/02/2018 0504   HCT WILL FOLLOW 03/17/2018 1512   PLT 265 07/02/2018 0504   PLT WILL FOLLOW 03/17/2018 1512   MCV 91.3 07/02/2018 0504   MCV WILL FOLLOW 03/17/2018 1512   MCH 31.8 07/02/2018  0504   MCHC 34.8 07/02/2018 0504   RDW 12.7 07/02/2018 0504   RDW WILL FOLLOW 03/17/2018 1512   LYMPHSABS 1.2 07/02/2018 0504   LYMPHSABS WILL FOLLOW 03/17/2018 1512   MONOABS 0.8 07/02/2018 0504   EOSABS 0.3 07/02/2018 0504   EOSABS WILL FOLLOW 03/17/2018 1512   BASOSABS 0.1 07/02/2018 0504   BASOSABS WILL FOLLOW 03/17/2018 1512    Studies/Results: No results found.  Medications: I have reviewed the patient's current medications.  Assessment/Plan:   1.  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  Continue with CIR 2.  DVT prophylaxis with SCDs 3.  Emotional support 4.  Pain management with Tylenol as needed 5.  Hypertension.  Norvasc, clonidine, chlorthalidone, Cozaar 6.  Type 2 diabetes.  Sliding scale insulin.  Continue with Amaryl, metformin 7.  Dyslipidemia.  On atorvastatin 8.  Rhinitis.  On Flonase and Claritin 9.  Chronic kidney disease.  Creatinine has been stable     Length of stay, days: 5  Walker Kehr , MD 07/06/2018, 2:46 PM

## 2018-07-07 ENCOUNTER — Inpatient Hospital Stay (HOSPITAL_COMMUNITY): Payer: PPO | Admitting: Physical Therapy

## 2018-07-07 ENCOUNTER — Inpatient Hospital Stay (HOSPITAL_COMMUNITY): Payer: PPO | Admitting: Occupational Therapy

## 2018-07-07 LAB — GLUCOSE, CAPILLARY
Glucose-Capillary: 145 mg/dL — ABNORMAL HIGH (ref 70–99)
Glucose-Capillary: 162 mg/dL — ABNORMAL HIGH (ref 70–99)
Glucose-Capillary: 85 mg/dL (ref 70–99)
Glucose-Capillary: 105 mg/dL — ABNORMAL HIGH (ref 70–99)

## 2018-07-07 NOTE — Progress Notes (Signed)
Occupational Therapy Session Note  Patient Details  Name: Peter York MRN: [id number] Date of Birth: 11/17/1945  Today's Date: 07/07/2018 OT Individual Time: 1230-1345 OT Individual Time Calculation (min): 75 min    Short Term Goals: Week 1:  OT Short Term Goal 1 (Week 1): STGs=LTGs Week 5:     Skilled Therapeutic Interventions/Progress Updates:    Patient in bed upon arrival and ready for therapy session.  Patient able to complete meal independently.  Reviewed goals for therapy and activity plan post discharge.  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  Toileting completed mod I with grab bar.  Don shoes at bed side CS.  Functional ambulation with rollator on unit and to /from out door seating area with CS.  He completed gait activity on sloped surfaces outside without difficulty, 4 steps up/down with CS and unilateral hand rail.  Patient returned to recliner at close of session with seat alarm set and callbell in reach.  Therapy Documentation Precautions:  Precautions Precautions: Fall Restrictions Weight Bearing Restrictions: No General:   Vital Signs: Therapy Vitals Temp: 98.9 F (37.2 C) Temp Source: Oral Pulse Rate: 65 Resp: 18 BP: 113/71 Patient Position (if appropriate): Sitting Oxygen Therapy SpO2: 97 % O2 Device: Room Air Pain: Pain Assessment Pain Scale: 0-10 Pain Score: 0-No pain   Other Treatments:     Therapy/Group: Individual Therapy  Carlos Levering 07/07/2018, 3:34 PM

## 2018-07-07 NOTE — Progress Notes (Signed)
Physical Therapy Session Note  Patient Details  Name: PHYLLIP CLAW MRN: [id number] Date of Birth: [birth year]  Today's Date: 07/07/2018 PT Individual Time: 0805-0900 PT Individual Time Calculation (min): 55 min   Short Term Goals: Week 1:  PT Short Term Goal 1 (Week 1): STG=LTG due to ELOS  Skilled Therapeutic Interventions/Progress Updates: Pt presented in bed agreeable to therapy. Performed bed mobility with HOB elevated and supervision. Pt donned shoes with set up and ambulated with rollator and supervision to rehab gym. Pt participated in floor transfer x 5 with pt progression from CGA to supervision and able to transition from hight kneeling to squat to standing. Pt then performed functional activity of cleaning mat using RUE and supporting self with LUE. Pt ambulated to day room with rollator and supervision and participated in high balance activities including obstacle course x 4 weaving through cones, stepping onto and over objects. Pt also participated in agility ladder for LLE coordination, stepping forward, sideways, backwards with cues to maintain forward hips vs turning outward. Pt completed session on NuStep L5 x 7 min for global conditioning. Pt returned to room with rollator supervision level and returned to bed supervision. Pt left with bed alarm on, call bell within reach and current needs met.      Therapy Documentation Precautions:  Precautions Precautions: Fall Restrictions Weight Bearing Restrictions: No General:   Vital Signs: Therapy Vitals Temp: 98.9 F (37.2 C) Temp Source: Oral Pulse Rate: 65 Resp: 18 BP: 113/71 Patient Position (if appropriate): Sitting Oxygen Therapy SpO2: 97 % O2 Device: Room Air Pain: Pain Assessment Pain Scale: 0-10 Pain Score: 0-No pain  Therapy/Group: Individual Therapy  Quantavis Obryant  Antonella Upson, PTA  07/07/2018, 4:11 PM

## 2018-07-07 NOTE — Progress Notes (Signed)
[facility] PHYSICAL MEDICINE & REHABILITATION PROGRESS NOTE   Subjective/Complaints:  Per PT should be ready this week Claritin helping nasal congestion  ROS- Negative CP, SOB, N/V/D                                                                                 Objective:   No results found. No results for input(s): WBC, HGB, HCT, PLT in the last 72 hours. No results for input(s): NA, K, CL, CO2, GLUCOSE, BUN, CREATININE, CALCIUM in the last 72 hours.  Intake/Output Summary (Last 24 hours) at 07/07/2018 0912 Last data filed at 07/07/2018 0450 Gross per 24 hour  Intake 480 ml  Output 1350 ml  Net -870 ml     Physical Exam: Vital Signs Blood pressure 120/77, pulse 60, temperature 98.3 F (36.8 C), temperature source Oral, resp. rate 18, height 5\' 7"  (1.702 m), weight 72.1 kg, SpO2 97 %.   General: No acute distress Mood and affect are appropriate Heart: Regular rate and rhythm no rubs murmurs or extra sounds Lungs: Clear to auscultation, breathing unlabored, no rales or wheezes Abdomen: Positive bowel sounds, soft nontender to palpation, nondistended Extremities: No clubbing, cyanosis, or edema Skin: No evidence of breakdown, no evidence of rash Neurologic: Cranial nerves II through XII intact, motor strength is 5/5 in bilateral deltoid, bicep, tricep, grip, hip flexor, knee extensors, ankle dorsiflexor and plantar flexor Sensory exam normal sensation to light touch and proprioception in bilateral upper and lower extremities + 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 No joint swelling   Assessment/Plan: 1. Functional deficits secondary to RIght ICH which require 3+ hours per day of interdisciplinary therapy in a comprehensive inpatient rehab setting.  Physiatrist is providing close team supervision and 24 hour management of  active medical problems listed below.  Physiatrist and rehab team continue to assess barriers to discharge/monitor patient progress toward functional and medical goals  Care Tool:  Bathing    Body parts bathed by patient: Right arm, Left arm, Chest, Abdomen, Front perineal area, Buttocks, Right upper leg, Left upper leg, Right lower leg, Left lower leg, Face         Bathing assist Assist Level: Set up assist     Upper Body Dressing/Undressing Upper body dressing   What is the patient wearing?: Pull over shirt    Upper body assist Assist Level: Independent with assistive device    Lower Body Dressing/Undressing Lower body dressing      What is the patient wearing?: Pants, Underwear/pull up     Lower body assist Assist for lower body dressing: Supervision/Verbal cueing     Toileting Toileting    Toileting assist Assist for toileting: Independent with assistive device     Transfers Chair/bed transfer  Transfers assist     Chair/bed transfer assist level: Supervision/Verbal cueing     Locomotion Ambulation   Ambulation assist      Assist level: Supervision/Verbal cueing Assistive device: Rollator Max distance: 253ft   Walk 10 feet activity   Assist     Assist  level: Supervision/Verbal cueing Assistive device: Rollator   Walk 50 feet activity   Assist    Assist level: Supervision/Verbal cueing Assistive device: Rollator    Walk 150 feet activity   Assist    Assist level: Supervision/Verbal cueing Assistive device: Rollator    Walk 10 feet on uneven surface  activity   Assist     Assist level: Supervision/Verbal cueing     Wheelchair     Assist Will patient use wheelchair at discharge?: No Type of Wheelchair: Manual    Wheelchair assist level: Contact Guard/Touching assist Max wheelchair distance: 120    Wheelchair 50 feet with 2 turns activity    Assist        Assist Level: Contact Guard/Touching assist    Wheelchair 150 feet activity     Assist     Assist Level: Contact Guard/Touching assist      Medical Problem List and Plan: 1.  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            CIR PT, OT, SLP 2.  Antithrombotics: -DVT/anticoagulation:  SCDs             -antiplatelet therapy: N/A 3. Pain Management:  Tylenol as needed 4. Mood:  Provide emotional support             -antipsychotic agents: N/A 5. Neuropsych: This patient is capable of making decisions on his own behalf. 6. Skin/Wound Care:  Routine skin checks 7. Fluids/Electrolytes/Nutrition:  Routine in and out's with follow-up chemistries in the AM. 8. Hypertension. Norvasc 10 mg daily, clonidine 0.1 mg daily, chlorthalidone 25 mg daily, Cozaar 50 mg daily. Monitor with increased mobility.  Vitals:   07/06/18 1949 07/07/18 0448  BP: 127/63 120/77  Pulse: 67 60  Resp: 18 18  Temp: 98.3 F (36.8 C) 98.3 F (36.8 C)  SpO2: 97% 97%  controlled 4/13 9. Diabetes mellitus. Hemoglobin A1c 6.8. SSI. Patient on Amaryl 4 mg daily and metformin 500 mg twice a day prior to admission. Resume as needed. Monitor with increased mobility.  CBG (last 3)  Recent Labs    07/06/18 1636 07/06/18 2106 07/07/18 0613  GLUCAP 103* 262* 145*  overall improve on amaryl may need SSI at home would not resume metformin Cont SSI 10. Hyperlipidemia. Lipitor 11.  Sinus congestion flonase daily with prn ocean, add claritin 12.  CKD Creat ~1.7 in Sept 2019, looks stable LOS: 6 days A FACE TO FACE EVALUATION WAS PERFORMED  Charlett Blake 07/07/2018, 9:12 AM

## 2018-07-07 NOTE — Progress Notes (Signed)
Social Work Patient ID: Peter York, male   DOB: 05-01-1945, 73 y.o.   MRN: [id number] Team feels pt will be ready for discharge 4/15-Wed. He is doing well in his therapies and MD is aware and approves with discharge date. Will work toward discharge date.

## 2018-07-07 NOTE — Progress Notes (Signed)
Occupational Therapy Session Note  Patient Details  Name: Peter York MRN: [id number] Date of Birth: [birth year]  Today's Date: 07/07/2018 OT Individual Time: 6701-4103 OT Individual Time Calculation (min): 58 min    Short Term Goals: Week 1:  OT Short Term Goal 1 (Week 1): STGs=LTGs  Skilled Therapeutic Interventions/Progress Updates:    Upon entering the room, pt supine in bed with no c/o pain. Pt agreeable to OT intervention. Pt ambulating in room without use of AD to obtain clothing items with distant supervision. Pt standing in shower while bathing without use of shower seat at intermittent supervision level. Pt donning UB clothing items at mod I level and supervision with LB clothing management. Pt ambulating without [place] of AD with close supervision to ADL apartment to practice floor transfer with min cuing for proper technique and supervision. Pt ambulating towards [employer]	[place] 400' with supervision as well. Pt returning to room at end of session with call bell and all needed items within reach.   Therapy Documentation Precautions:  Precautions Precautions: Fall Restrictions Weight Bearing Restrictions: No   Therapy/Group: Individual Therapy  Gypsy Decant 07/07/2018, 12:14 PM

## 2018-07-07 NOTE — Plan of Care (Signed)
  Problem: Consults Goal: RH STROKE PATIENT EDUCATION Description See Patient Education module for education specifics  Outcome: Progressing Goal: Diabetes Guidelines if Diabetic/Glucose > 140 Description If diabetic or lab glucose is > 140 mg/dl - Initiate Diabetes/Hyperglycemia Guidelines & Document Interventions  Outcome: Progressing   Problem: RH SKIN INTEGRITY Goal: RH STG SKIN FREE OF INFECTION/BREAKDOWN Outcome: Progressing Goal: RH STG MAINTAIN SKIN INTEGRITY WITH ASSISTANCE Description STG Maintain Skin Integrity With min Assistance.  Outcome: Progressing Flowsheets (Taken 07/07/2018 1430) STG: Maintain skin integrity with assistance: 4-Minimal assistance   Problem: RH SAFETY Goal: RH STG ADHERE TO SAFETY PRECAUTIONS W/ASSISTANCE/DEVICE Description STG Adhere to Safety Precautions With mod I Assistance/Device.  Outcome: Progressing Flowsheets (Taken 07/07/2018 1430) STG:Pt will adhere to safety precautions with assistance/device: 5-Supervision/cueing   Problem: RH KNOWLEDGE DEFICIT Goal: RH STG INCREASE KNOWLEDGE OF DIABETES Description Able to state 2 symptoms of Hypoglycemia/hyperglycemia.  Outcome: Progressing Goal: RH STG INCREASE KNOWLEDGE OF HYPERTENSION Description Able to identify 3 blood pressure medications he is currently taking and 2 other ways he can manage hypertension  Outcome: Progressing Goal: RH STG INCREASE KNOWLEGDE OF HYPERLIPIDEMIA Description State the medication he is taking for HLD and 1 other treatment to manage HLD  Outcome: Progressing

## 2018-07-08 ENCOUNTER — Inpatient Hospital Stay (HOSPITAL_COMMUNITY): Payer: PPO | Admitting: Physical Therapy

## 2018-07-08 ENCOUNTER — Inpatient Hospital Stay (HOSPITAL_COMMUNITY): Payer: PPO

## 2018-07-08 LAB — GLUCOSE, CAPILLARY
Glucose-Capillary: 140 mg/dL — ABNORMAL HIGH (ref 70–99)
Glucose-Capillary: 130 mg/dL — ABNORMAL HIGH (ref 70–99)
Glucose-Capillary: 98 mg/dL (ref 70–99)
Glucose-Capillary: 188 mg/dL — ABNORMAL HIGH (ref 70–99)

## 2018-07-08 NOTE — Progress Notes (Signed)
Physical Therapy Session Note  Patient Details  Name: Peter York MRN: [id number] Date of Birth: 1946-01-26  Today's Date: 07/08/2018 PT Individual Time: 0803-0900 PT Individual Time Calculation (min): 57 min   Short Term Goals: Week 1:  PT Short Term Goal 1 (Week 1): STG=LTG due to ELOS  Skilled Therapeutic Interventions/Progress Updates: Pt presented in bed agreeable to therapy. Pt performed all transfers throughout session without and without AD at Mod I level. Pt ambulated to various gyms throughout unit and participated in care tool and functional activities at mod I level, including stairs, car transfers, and gait. Pt also performed updated Berg Balance Test and DGI with updated scores of 52/56 and 22 respectively as noted below. At end of session pt participated in Dynavision Modes A and B while sitting on physioball performing dynamic reaching with good effort and no LOB. Pt ambulated back to room at end of session and returned to bed to await next session with call bell within reach and needs met.      Therapy Documentation Precautions:  Precautions Precautions: Fall Restrictions Weight Bearing Restrictions: No General:   Vital Signs:   Pain: Pain Assessment Pain Scale: 0-10 Pain Score: 0-No pain Mobility: Bed Mobility Bed Mobility: Rolling Left;Rolling Right;Supine to Sit;Sit to Supine Rolling Right: Independent Rolling Left: Independent Supine to Sit: Independent Sit to Supine: Independent Transfers Transfers: Sit to Stand Sit to Stand: Independent with assistive device Stand Pivot Transfers: Independent with assistive device Locomotion : Gait Ambulation: Yes Gait Assistance: Independent with assistive device Gait Distance (Feet): 150 Feet Assistive device: Rollator Gait Gait: Yes Gait Pattern: Impaired Gait Pattern: Antalgic Stairs / Additional Locomotion Stairs: Yes Stairs Assistance: Independent with assistive device Stair Management Technique: Two  rails Number of Stairs: 12 Height of Stairs: 6 Wheelchair Mobility Wheelchair Mobility: No  Trunk/Postural Assessment : Cervical Assessment Cervical Assessment: Within Functional Limits Thoracic Assessment Thoracic Assessment: Within Functional Limits Lumbar Assessment Lumbar Assessment: Within Functional Limits Postural Control Postural Control: Deficits on evaluation  Balance: Balance Balance Assessed: Yes Standardized Balance Assessment Standardized Balance Assessment: Berg Balance Test;Dynamic Gait Index Berg Balance Test Sit to Stand: Able to stand without using hands and stabilize independently Standing Unsupported: Able to stand safely 2 minutes Sitting with Back Unsupported but Feet Supported on Floor or Stool: Able to sit safely and securely 2 minutes Stand to Sit: Sits safely with minimal use of hands Transfers: Able to transfer safely, minor use of hands Standing Unsupported with Eyes Closed: Able to stand 10 seconds safely Standing Ubsupported with Feet Together: Able to place feet together independently and stand 1 minute safely From Standing, Reach Forward with Outstretched Arm: Can reach confidently >25 cm (10") From Standing Position, Pick up Object from Floor: Able to pick up shoe safely and easily From Standing Position, Turn to Look Behind Over each Shoulder: Looks behind one side only/other side shows less weight shift Turn 360 Degrees: Able to turn 360 degrees safely one side only in 4 seconds or less Standing Unsupported, Alternately Place Feet on Step/Stool: Able to stand independently and safely and complete 8 steps in 20 seconds Standing Unsupported, One Foot in Front: Able to plae foot ahead of the other independently and hold 30 seconds Standing on One Leg: Able to lift leg independently and hold 5-10 seconds Total Score: 52 Dynamic Gait Index Level Surface: Normal Change in Gait Speed: Mild Impairment Gait with Horizontal Head Turns: Mild  Impairment Gait with Vertical Head Turns: Normal Gait and Pivot Turn: Normal  Step Over Obstacle: Normal Step Around Obstacles: Normal Steps: Normal Total Score: 22 Dynamic Sitting Balance Dynamic Sitting - Level of Assistance: 7: Independent Static Standing Balance Static Standing - Level of Assistance: 7: Independent Dynamic Standing Balance Dynamic Standing - Level of Assistance: 6: Modified independent (Device/Increase time) Exercises:   Other Treatments:      Therapy/Group: Individual Therapy  Ariellah Faust  Enmanuel Zufall, PTA  07/08/2018, 1:04 PM

## 2018-07-08 NOTE — Progress Notes (Signed)
Physical Therapy Session Note  Patient Details  Name: Peter York MRN: [id number] Date of Birth: [birth year]  Today's Date: 07/08/2018 PT Individual Time: 1330-1445 PT Individual Time Calculation (min): 75 min   Short Term Goals: Week 1:  PT Short Term Goal 1 (Week 1): STG=LTG due to ELOS  Skilled Therapeutic Interventions/Progress Updates:    Patient in supine agreeable to PT, requesting outside.  Transfers mod I and ambulated with rollator >300' off unit and outside.  Gait in grass no device with CGA and on incline cues for higher stepping with L LE.  Performed gait no device on inclined pavement and around picnic tables.  Side stepping, crossing over in front and behind and braiding CGA cues for shorter step on R and longer on L.  Tandem gait and gait on heels CGA facilitation for increased R pelvic protraction.  Patient ambulated to unit with rollator mod I.  Seated on blue therapy ball using green weighted ball for core strength and sitting balance diagonals, rotation and sit to stands.  Patient negotiated 10 steps x 2 with 2 rails and L rail only mod I cues for higher step on L to ascend due to utilizing step through technique.  Patient ambulated to room with rollator and left in supine with call bell & needs in reach.   Therapy Documentation Precautions:  Precautions Precautions: Fall Restrictions Weight Bearing Restrictions: No Pain: Pain Assessment Pain Score: 0-No pain    Therapy/Group: Individual Therapy  Reginia Naas  Magda Kiel, PT 07/08/2018, 4:59 PM

## 2018-07-08 NOTE — Progress Notes (Signed)
Occupational Therapy Session Note  Patient Details  Name: Peter York MRN: [id number] Date of Birth: [birth year]  Today's Date: 07/08/2018 OT Individual Time: 1030-1130 OT Individual Time Calculation (min): 60 min    Short Term Goals: Week 1:  OT Short Term Goal 1 (Week 1): STGs=LTGs  Skilled Therapeutic Interventions/Progress Updates:    Session focused on d/c planning, functional mobility, and ADLs. Pt c/o R foot hurting, 3/10 no request for pain intervention. Pt completed shower in standing with mod I. Pt completed dressing with mod I, occasional cueing for safety awareness at home. Pt completed 9 hole peg test with an improvement in scores both in R and L. LUE: 38.5 seconds, RUE: 21.5 seconds. Pt completed 200 ft of functional mobility without AD and then opted to use rollator to complete functional mobility outside. While outside, pt trialed walking over uneven surface and differing size stairs with (S), progressing to mod I. Pt returned to his room and was left supine with all needs met.   Therapy Documentation Precautions:  Precautions Precautions: Fall Restrictions Weight Bearing Restrictions: No    Pain: Pain Assessment Pain Scale: 0-10 Pain Score: 0-No pain Pain Location: Foot(R foot)   Therapy/Group: Individual Therapy  Curtis Sites 07/08/2018, 11:47 AM

## 2018-07-08 NOTE — Discharge Instructions (Signed)
Inpatient Rehab Discharge Instructions  Peter York Discharge date and time: No discharge date for patient encounter.   Activities/Precautions/ Functional Status: Activity: activity as tolerated Diet: diabetic diet Wound Care: none needed Functional status:  ___ No restrictions     ___ Walk up steps independently ___ 24/7 supervision/assistance   ___ Walk up steps with assistance ___ Intermittent supervision/assistance  ___ Bathe/dress independently ___ Walk with walker     _x__ Bathe/dress with assistance ___ Walk Independently    ___ Shower independently ___ Walk with assistance    ___ Shower with assistance ___ No alcohol     ___ Return to work/school ________  Special Instructions:  no driving    COMMUNITY REFERRALS UPON DISCHARGE:    Home Health:   PT  [facility]   Phone: [phone]  Date of last service:07/09/2018   Medical Equipment/Items Ordered:ROLLATOR ROLLING WALKER  Agency/Supplier:[facility]   [phone]   GENERAL COMMUNITY RESOURCES FOR PATIENT/FAMILY: Support Groups:[facility] SUPPORT GROUP (SEPT- MAY) @ 6:00-7:00 PM ON THE REHAB UNIT QUESTIONS CONTACT [facility] [phone]  My questions have been answered and I understand these instructions. I will adhere to these goals and the provided educational materials after my discharge from the hospital.  Patient/Caregiver Signature _______________________________ Date __________  Clinician Signature _______________________________________ Date __________  Please bring this form and your medication list with you to all your follow-up doctor's appointments.

## 2018-07-08 NOTE — Discharge Summary (Signed)
Physician Discharge Summary  Patient ID: Peter York MRN: [id number] DOB/AGE: September 09, 1945 73 y.o.  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  Right basal ganglia hemorrhage. EXAM: CT ANGIOGRAPHY HEAD TECHNIQUE: Multidetector CT imaging of the head was performed using the standard protocol during bolus administration of intravenous contrast. Multiplanar CT image reconstructions and MIPs were obtained to evaluate the vascular anatomy. CONTRAST:  39mL OMNIPAQUE IOHEXOL 350 MG/ML SOLN COMPARISON:  Head CT earlier same day FINDINGS: CTA HEAD Anterior circulation: Both internal carotid arteries are patent through the skull base and siphon regions. There is atherosclerotic calcification in the carotid siphon regions but no stenosis greater than 30%. The anterior and middle cerebral vessels are patent. There is mild atherosclerotic irregularity but no evidence of flow limiting stenosis or large/medium vessel occlusion. 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 This would not relate to the right basal ganglia hemorrhage. Posterior circulation: Both vertebral arteries are patent with the left being dominant. No basilar stenosis. Posterior circulation branch vessels are patent, but show considerable atherosclerotic irregularity. Venous sinuses: Patent and normal. Anatomic variants: None significant. Delayed phase: No abnormal enhancement. IMPRESSION: No large or medium vessel occlusion. No flow limiting proximal stenosis. Diffuse atherosclerotic change of the more distal intracranial branch vessels. 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 This would not relate to the right basal ganglia intraparenchymal hemorrhage. Electronically Signed   By: Nelson Chimes M.D.   On: 06/29/2018 15:09   Chest 2 View  Result Date: 06/29/2018 CLINICAL DATA:  Intracerebral hemorrhage.  Left-sided weakness. EXAM: CHEST - 2 VIEW COMPARISON:  02/11/2016 FINDINGS: The heart size and mediastinal contours are within normal limits. Both lungs are clear. Several old midthoracic vertebral body compression deformities are again seen. IMPRESSION: No active cardiopulmonary disease. Electronically Signed   By: Earle Gell M.D.   On: 06/29/2018 15:03   [name] Contrast  Result Date: 06/30/2018 CLINICAL DATA:  Abnormal head CT EXAM: MRI HEAD WITHOUT CONTRAST TECHNIQUE: Multiplanar, multiecho pulse sequences of the brain and surrounding structures were obtained without intravenous contrast. COMPARISON:  Head CT and CTA from yesterday FINDINGS: Brain: T2 iso- and T1 mildly hyper-intense acute hematoma centered in the right putamen, measuring 16 mm. There is a rim of mild vasogenic edema. Diffusion signal extends superiorly towards the lateral ventricle that is blood products rather than neighboring infarct based on preceding CTA. No suspected underlying infarct. No evidence of mass. No evidence of amyloid angiopathy. There is mild chronic small vessel ischemic type change in the cerebral white matter. Vascular: Major flow voids are preserved, reference preceding CTA concerning and anterior communicating artery region aneurysm. Skull and upper cervical spine: Negative for marrow lesion Sinuses/Orbits: Left maxillary sinusitis with central inspissated material and peripheral mucosal thickening. Is also left anterior ethmoid sinusitis. IMPRESSION: 1. Small hematoma in the right basal ganglia without evidence of underlying lesion. No evidence of amyloid angiopathy. Chronic small vessel disease is mild. 2. Obstructed left maxillary sinus.  Electronically Signed   By: Monte Fantasia M.D.   On: 06/30/2018 05:59   Ct Head Code Stroke Wo Contrast  Result Date: 06/29/2018 CLINICAL DATA:  Code stroke.  Left arm and  leg weakness. EXAM: CT HEAD WITHOUT CONTRAST TECHNIQUE: Contiguous axial images were obtained from the base of the skull through the vertex without intravenous contrast. COMPARISON:  None. FINDINGS: Brain: 15 mm high-density hematoma in the posterior right putamen and extending into the posterior limb internal capsule. No superimposed acute infarct is noted. Mild small vessel ischemic type change in the cerebral white matter. Remote lacunar infarct in the right caudate body. No hydrocephalus, collection, or masslike finding Vascular: Atherosclerotic calcification.  No hyperdense vessel Skull: Negative Sinuses/Orbits: Opacified left maxillary sinus with findings of mixed density secretions surrounded by low-density mucosal thickening. Other: Critical Value/emergent results were called by telephone at the time of interpretation on 06/29/2018 at 9:38 am to Dr. Nanda Quinton , who verbally acknowledged these results. ASPECTS [facility] Stroke Program Early CT Score) not scored in the setting of hemorrhage IMPRESSION: 1. 15 mm acute hematoma in the right basal ganglia/internal capsule. 2. Chronic small vessel disease. 3. Left maxillary sinusitis. Electronically Signed   By: Monte Fantasia M.D.   On: 06/29/2018 09:41    Labs:  Basic Metabolic Panel: Recent Labs  Lab 07/02/18 0504  NA 134*  K 3.7  CL 99  CO2 23  GLUCOSE 179*  BUN 29*  CREATININE 1.70*  CALCIUM 9.4    CBC: Recent Labs  Lab 07/02/18 0504  WBC 9.9  NEUTROABS 7.6  HGB 12.1*  HCT 34.8*  MCV 91.3  PLT 265    CBG: Recent Labs  Lab 07/07/18 0613 07/07/18 1202 07/07/18 1721 07/07/18 2102 07/08/18 0600  GLUCAP 145* 162* 85 105* 140*   Family history. Father with colon cancer as well as colon polyps, maternal uncle with myocardial infarction as well as CAD,  daughter with type 1 diabetes mellitus, brother with transient ischemic attack  Brief HPI:    Peter York is a 73 year old right-handed male with history of hypertension maintained on Norvasc, clonidine, chlorthalidone, Cozaar as well as diabetes mellitus maintained on Amaryl and metformin and hyperlipidemia. Per chart review lives with spouse independent and active. Presented 06/29/2018 [facility] hospital with left side weakness and balance deficits. Blood pressure 170/82. CT of the head showed a 15 mm acute hematoma right basal ganglia internal capsule. CT angiogram of head showed no large vessel or medium vessel occlusion. Incidental findings of a 5.5 mm ACOM . Patient transferred to [facility] for further evaluation. Maintained on Cardene drip for blood pressure control. Follow-up MRI 06/30/2018 showed small hematoma right basal ganglia without evidence of underlying lesion. Neurology follow-up conservative care again close monitoring of blood pressure. Tolerating a regular diet. Therapy evaluations completed patient was admitted for a comprehensive rehabilitation program.  Hospital Course: DEKARI BURES was admitted to rehab 07/01/2018 for inpatient therapies to consist of PT, ST and OT at least three hours five days a week. Past admission physiatrist, therapy team and rehab RN have worked together to provide customized collaborative inpatient rehab. Pertaining to patient's right basal ganglia ICH secondary to hypertensive crisis conservative care provided follow-up outpatient with PCP for maintaining of hypertension as well as neurology services. SCDs for DVT prophylaxis. Blood pressures controlled on Norvasc 10 mg daily, chlorthalidone 25 mg daily, clonidine 0.1 mg daily as well as Cozaar 50 mg daily. Diabetes mellitus hemoglobin A1c 6.8 patient's Amaryl had been resumed 2 mg daily an increased to 4 mg with diabetic teaching. Lipitor for hyperlipidemia. Renal function with monitoring of  creatinine 1.88 and Glucophage remained on hold. Patient would follow-up with primary M.D.  Physical exam. Blood pressure 123/70 pulse 69 temp 98 respirations 17 oxygen saturation 96% room air Constitutional. Alert and oriented well-developed HEENT Head. Normocephalic and atraumatic Eyes. EOMs normal no discharge without nystagmus Respiratory. Effort normal without wheeze GI. Exhibits no distention nontender without rebound Cardiac regular rate rhythm without murmur or friction rub heard Musculoskeletal no edema or tenderness in lower extremities Motor strength right lower extremity 5 out of 5 proximal to distal left upper left lower sherry 4 minus out of 5 proximal to distal.  Rehab course: During patient's stay in rehab weekly team conferences were held to monitor patient's progress, set goals and discuss barriers to discharge. At admission, patient required minimum to moderate assist 120 feet rolling walker, minimal assist sit to stand, minimal assist supine to sit. Patient did fatigue easily.minimal assist for grooming. Minimal assist upper and lower body bathing and dressing.  He  has had improvement in activity tolerance, balance, postural control as well as ability to compensate for deficits. He/She has had improvement in functional use RUE/LUE  and RLE/LLE as well as improvement in awareness.patient could put on his shoes with set up ambulates with a rolling walker and supervision to the rehabilitation gym. Per dissipated and floor transfers contact-guard assist. Perform functional activities of cleaning mat using right upper extremity and supporting self with left upper extremity. Ambulated back to his room. Per dissipated in agility ladder. He could gather his belongings for activities of daily living and homemaking was addressed and no driving. Family teaching completed.       Disposition:  discharged to home   Diet: diabetic diet  Special Instructions: no  driving   Medications at discharge 1. Tylenol as needed 2. Norvasc 10 mg daily 3. Lipitor 10 mg by mouth daily 4.Calcium carbonate Daily as needed 5. Chlorthalidone 25 mg by mouth daily 6. Clonidine 0.1 mg by mouth daily 7. Amaryl 4 mg by mouth daily 8. Cozaar 50 mg by mouth daily 9. Protonix 40 mg by mouth daily at bedtime 10. Senokot-S 1 tablet by mouth twice a day 11. Vitamin E 400 units by mouth daily    Discharge Instructions    Ambulatory referral to Neurology   Complete by:  As directed    An appointment is requested in approximately 4 weeks ICH secondary to hypertensive crisis   Ambulatory referral to Physical Medicine Rehab   Complete by:  As directed    Moderate complexity follow up 1-2 weeks ICH      Follow-up Information    Kirsteins, Luanna Salk, MD Follow up.   Specialty:  Physical Medicine and Rehabilitation Why:  office to call for appointment Contact information: [place] [address] [phone]           Signed: Cathlyn Parsons 07/08/2018, 11:18 AM

## 2018-07-08 NOTE — Plan of Care (Signed)
  Problem: Consults Goal: RH STROKE PATIENT EDUCATION Description See Patient Education module for education specifics  Outcome: Progressing Goal: Diabetes Guidelines if Diabetic/Glucose > 140 Description If diabetic or lab glucose is > 140 mg/dl - Initiate Diabetes/Hyperglycemia Guidelines & Document Interventions  Outcome: Progressing   Problem: RH SKIN INTEGRITY Goal: RH STG SKIN FREE OF INFECTION/BREAKDOWN Outcome: Progressing Goal: RH STG MAINTAIN SKIN INTEGRITY WITH ASSISTANCE Description STG Maintain Skin Integrity With min Assistance.  Outcome: Progressing   Problem: RH SAFETY Goal: RH STG ADHERE TO SAFETY PRECAUTIONS W/ASSISTANCE/DEVICE Description STG Adhere to Safety Precautions With mod I Assistance/Device.  Outcome: Progressing   Problem: RH KNOWLEDGE DEFICIT Goal: RH STG INCREASE KNOWLEDGE OF DIABETES Description Able to state 2 symptoms of Hypoglycemia/hyperglycemia.  Outcome: Progressing Goal: RH STG INCREASE KNOWLEDGE OF HYPERTENSION Description Able to identify 3 blood pressure medications he is currently taking and 2 other ways he can manage hypertension  Outcome: Progressing Goal: RH STG INCREASE KNOWLEGDE OF HYPERLIPIDEMIA Description State the medication he is taking for HLD and 1 other treatment to manage HLD  Outcome: Progressing

## 2018-07-08 NOTE — Progress Notes (Signed)
Social Work  Discharge Note  The overall goal for the admission was met for:   Discharge location: Yes-HOME WITH WIFE AND DAUGHTER WHO CAN PROVIDE 24 HR SUPERVISION  Length of Stay: Yes-8 DAYS  Discharge activity level: Yes-SUPERVISION-MOD/I LEVEL  Home/community participation: Yes  Services provided included: MD, RD, PT, OT, RN, CM, Pharmacy and SW  Financial Services: Private Insurance: [employer]  Follow-up services arranged: Home Health: [facility]-PT, DME: [facility] and Patient/Family has no preference for HH/DME agencies  Comments (or additional information):WIFE AND DAUGHTER CAN PROVIDE SUPERVISION LEVEL. PT DID WELL AND PROGRESSED QUICKLY WHILE HERE. ONLY NEED A COUPLE HHPT VISITS DUE TO DOING SO WELL. PT WANTING TO GET BACK TO HIS HORSES AND DOGS.  Patient/Family verbalized understanding of follow-up arrangements: Yes  Individual responsible for coordination of the follow-up plan: SELF & CAROL-WIFE  Confirmed correct DME delivered: Elease Hashimoto 07/08/2018    Elease Hashimoto

## 2018-07-08 NOTE — Progress Notes (Signed)
Physical Therapy Discharge Summary  Patient Details  Name: Peter York MRN: [id number] Date of Birth: 12/26/1945  Today's Date: 07/08/2018    Patient has met 11 of 11 long term goals due to improved activity tolerance, improved balance, improved postural control, improved attention, improved awareness and improved coordination.  Patient to discharge at an ambulatory level Modified Independent.   Patient's care partner is independent to provide the necessary physical assistance at discharge.  Reasons goals not met: N/A all goals met  Recommendation:  Patient will benefit from ongoing skilled PT services in home health setting to continue to advance safe functional mobility, address ongoing impairments in balance, strength, coordination safety, and minimize fall risk.  Equipment: rollator  Reasons for discharge: treatment goals met  Patient/family agrees with progress made and goals achieved: Yes  PT Discharge Precautions/Restrictions   Vital Signs Therapy Vitals Temp: 98.7 F (37.1 C) Temp Source: Oral Pulse Rate: 63 Resp: 18 BP: 126/75 Patient Position (if appropriate): Lying Oxygen Therapy SpO2: 99 % O2 Device: Room Air Pain Pain Assessment Pain Scale: 0-10 Pain Score: 4  Pain Type: Acute pain Pain Location: Foot(R foot) Pain Descriptors / Indicators: Headache Pain Frequency: Intermittent Pain Onset: Gradual Pain Intervention(s): Medication (See eMAR) Vision/Perception     Cognition Overall Cognitive Status: Within Functional Limits for tasks assessed Arousal/Alertness: Awake/alert Orientation Level: Oriented X4 Sensation   Motor  Motor Motor - Discharge Observations: L side hemiplegia much improved from initial evaluation  Mobility Bed Mobility Bed Mobility: Rolling Left;Rolling Right;Supine to Sit;Sit to Supine Rolling Right: Independent Rolling Left: Independent Supine to Sit: Independent Sit to Supine: Independent Transfers Transfers: Sit to  Stand Sit to Stand: Independent with assistive device Transfer (Assistive device): Rollator Locomotion  Gait Ambulation: Yes Gait Assistance: Independent with assistive device Gait Distance (Feet): 150 Feet Assistive device: Rollator Gait Gait: Yes Gait Pattern: Impaired Gait Pattern: Antalgic Stairs / Additional Locomotion Stairs: Yes Stairs Assistance: Independent with assistive device Stair Management Technique: Two rails Number of Stairs: 12 Height of Stairs: 6 Wheelchair Mobility Wheelchair Mobility: No  Trunk/Postural Assessment  Cervical Assessment Cervical Assessment: Within Functional Limits Thoracic Assessment Thoracic Assessment: Within Functional Limits Lumbar Assessment Lumbar Assessment: Within Functional Limits Postural Control Postural Control: Deficits on evaluation  Balance Berg Balance Test Sit to Stand: Able to stand without using hands and stabilize independently Standing Unsupported: Able to stand safely 2 minutes Sitting with Back Unsupported but Feet Supported on Floor or Stool: Able to sit safely and securely 2 minutes Stand to Sit: Sits safely with minimal use of hands Transfers: Able to transfer safely, minor use of hands Standing Unsupported with Eyes Closed: Able to stand 10 seconds safely Standing Ubsupported with Feet Together: Able to place feet together independently and stand 1 minute safely From Standing, Reach Forward with Outstretched Arm: Can reach confidently >25 cm (10") From Standing Position, Pick up Object from Floor: Able to pick up shoe safely and easily From Standing Position, Turn to Look Behind Over each Shoulder: Looks behind one side only/other side shows less weight shift Turn 360 Degrees: Able to turn 360 degrees safely one side only in 4 seconds or less Standing Unsupported, Alternately Place Feet on Step/Stool: Able to stand independently and safely and complete 8 steps in 20 seconds Standing Unsupported, One Foot in  Front: Able to plae foot ahead of the other independently and hold 30 seconds Standing on One Leg: Able to lift leg independently and hold 5-10 seconds Total Score: 52 Dynamic Gait Index  Level Surface: Normal Change in Gait Speed: Mild Impairment Gait with Horizontal Head Turns: Mild Impairment Gait with Vertical Head Turns: Normal Gait and Pivot Turn: Normal Step Over Obstacle: Normal Step Around Obstacles: Normal Steps: Normal Total Score: 22 Dynamic Sitting Balance Dynamic Sitting - Level of Assistance: 7: Independent Static Standing Balance Static Standing - Level of Assistance: 7: Independent Dynamic Standing Balance Dynamic Standing - Level of Assistance: 6: Modified independent (Device/Increase time) Extremity Assessment      RLE Assessment RLE Assessment: Within Functional Limits LLE Assessment LLE Assessment: Exceptions to [facility] Strength Comments: 4+/5 hip flexion, hip abduction, hip extension. 5/5 knee flexion and extension with delayed activation     [name] 07/08/2018, 8:43 AM

## 2018-07-08 NOTE — Progress Notes (Signed)
Occupational Therapy Discharge Summary  Patient Details  Name: Peter York MRN: [id number] Date of Birth: 22-May-1945     Patient has met 12 of 12 long term goals due to improved activity tolerance, improved balance, ability to compensate for deficits, functional use of  LEFT upper and LEFT lower extremity and improved coordination.  Patient to discharge at overall Modified Independent level and supervision for shower transfers.  Reasons goals not met: all goals met  Recommendation:  Patient will benefit from ongoing skilled OT services in home health setting to continue to advance functional skills in the area of iADL and Vocation.  Equipment: No equipment provided  Reasons for discharge: treatment goals met  Patient/family agrees with progress made and goals achieved: Yes  OT Discharge Precautions/Restrictions  Precautions Precautions: Fall Pain Pain Assessment Pain Scale: 0-10 Pain Score: 0-No pain Pain Location: Foot(R foot) Vision Baseline Vision/History: Wears glasses Wears Glasses: At all times Patient Visual Report: No change from baseline Vision Assessment?: No apparent visual deficits Cognition Overall Cognitive Status: Within Functional Limits for tasks assessed Arousal/Alertness: Awake/alert Orientation Level: Oriented X4 Motor  Motor Motor - Discharge Observations: L side hemiplegia much improved from initial evaluation Mobility  Bed Mobility Bed Mobility: Rolling Left;Rolling Right;Supine to Sit;Sit to Supine Rolling Right: Independent Rolling Left: Independent Supine to Sit: Independent Sit to Supine: Independent Transfers Sit to Stand: Independent with assistive device  Trunk/Postural Assessment  Cervical Assessment Cervical Assessment: Within Functional Limits Thoracic Assessment Thoracic Assessment: Within Functional Limits Lumbar Assessment Lumbar Assessment: Within Functional Limits Postural Control Postural Control: Deficits on  evaluation  Balance Balance Balance Assessed: Yes Berg Balance Test Sit to Stand: Able to stand without using hands and stabilize independently Standing Unsupported: Able to stand safely 2 minutes Sitting with Back Unsupported but Feet Supported on Floor or Stool: Able to sit safely and securely 2 minutes Stand to Sit: Sits safely with minimal use of hands Transfers: Able to transfer safely, minor use of hands Standing Unsupported with Eyes Closed: Able to stand 10 seconds safely Standing Ubsupported with Feet Together: Able to place feet together independently and stand 1 minute safely From Standing, Reach Forward with Outstretched Arm: Can reach confidently >25 cm (10") From Standing Position, Pick up Object from Floor: Able to pick up shoe safely and easily From Standing Position, Turn to Look Behind Over each Shoulder: Looks behind one side only/other side shows less weight shift Turn 360 Degrees: Able to turn 360 degrees safely one side only in 4 seconds or less Standing Unsupported, Alternately Place Feet on Step/Stool: Able to stand independently and safely and complete 8 steps in 20 seconds Standing Unsupported, One Foot in Front: Able to plae foot ahead of the other independently and hold 30 seconds Standing on One Leg: Able to lift leg independently and hold 5-10 seconds Total Score: 52 Dynamic Gait Index Level Surface: Normal Change in Gait Speed: Mild Impairment Gait with Horizontal Head Turns: Mild Impairment Gait with Vertical Head Turns: Normal Gait and Pivot Turn: Normal Step Over Obstacle: Normal Step Around Obstacles: Normal Steps: Normal Total Score: 22 Dynamic Sitting Balance Dynamic Sitting - Level of Assistance: 7: Independent Static Standing Balance Static Standing - Level of Assistance: 7: Independent Dynamic Standing Balance Dynamic Standing - Level of Assistance: 6: Modified independent (Device/Increase time) Extremity/Trunk Assessment RUE  Assessment RUE Assessment: Within Functional Limits LUE Assessment LUE Assessment: Within Functional Limits   Gypsy Decant 07/08/2018, 10:40 AM

## 2018-07-08 NOTE — Progress Notes (Signed)
[place] PHYSICAL MEDICINE & REHABILITATION PROGRESS NOTE   Subjective/Complaints: Working with PT today Right great toe sore, "I get this with these shoes"  But also states he's had gout in past.  Pain is mild Appreciate SW note ROS- Negative CP, SOB, N/V/D                                                                                 Objective:   No results found. No results for input(s): WBC, HGB, HCT, PLT in the last 72 hours. No results for input(s): NA, K, CL, CO2, GLUCOSE, BUN, CREATININE, CALCIUM in the last 72 hours.  Intake/Output Summary (Last 24 hours) at 07/08/2018 0828 Last data filed at 07/08/2018 0700 Gross per 24 hour  Intake 480 ml  Output 1400 ml  Net -920 ml     Physical Exam: Vital Signs Blood pressure 126/75, pulse 63, temperature 98.7 F (37.1 C), temperature source Oral, resp. rate 18, height 5\' 7"  (1.702 m), weight 72.1 kg, SpO2 99 %.   General: No acute distress Mood and affect are appropriate Heart: Regular rate and rhythm no rubs murmurs or extra sounds Lungs: Clear to auscultation, breathing unlabored, no rales or wheezes Abdomen: Positive bowel sounds, soft nontender to palpation, nondistended Extremities: No clubbing, cyanosis, or edema, no erythema R 1st MTP, no pain with active ROM Skin: No evidence of breakdown, no evidence of rash Neurologic: Cranial nerves II through XII intact, motor strength is 5/5 in bilateral deltoid, bicep, tricep, grip, hip flexor, knee extensors, ankle dorsiflexor and plantar flexor Sensory exam normal sensation to light touch and proprioception in bilateral upper and lower extremities + 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 No joint swelling   Assessment/Plan: 1. Functional deficits secondary to RIght ICH which require 3+ hours per day of interdisciplinary therapy  in a comprehensive inpatient rehab setting.  Physiatrist is providing close team supervision and 24 hour management of active medical problems listed below.  Physiatrist and rehab team continue to assess barriers to discharge/monitor patient progress toward functional and medical goals  Care Tool:  Bathing    Body parts bathed by patient: Right arm, Left arm, Chest, Abdomen, Front perineal area, Buttocks, Right upper leg, Left upper leg, Right lower leg, Left lower leg, Face         Bathing assist Assist Level: Supervision/Verbal cueing     Upper Body Dressing/Undressing Upper body dressing   What is the patient wearing?: Pull over shirt    Upper body assist Assist Level: Independent with assistive device    Lower Body Dressing/Undressing Lower body dressing      What is the patient wearing?: Pants, Underwear/pull up     Lower body assist Assist for lower body dressing: Supervision/Verbal cueing     Toileting Toileting    Toileting assist Assist for toileting: Independent with assistive device     Transfers Chair/bed transfer  Transfers assist     Chair/bed transfer assist level: Independent with assistive device     Locomotion Ambulation   Ambulation assist  Assist level: Supervision/Verbal cueing Assistive device: Other (comment)(no device) Max distance: 400'   Walk 10 feet activity   Assist     Assist level: Supervision/Verbal cueing Assistive device: Rollator   Walk 50 feet activity   Assist    Assist level: Supervision/Verbal cueing Assistive device: Rollator    Walk 150 feet activity   Assist    Assist level: Supervision/Verbal cueing Assistive device: Rollator    Walk 10 feet on uneven surface  activity   Assist     Assist level: Supervision/Verbal cueing     Wheelchair     Assist Will patient use wheelchair at discharge?: No Type of Wheelchair: Manual    Wheelchair assist level: Contact  Guard/Touching assist Max wheelchair distance: 120    Wheelchair 50 feet with 2 turns activity    Assist        Assist Level: Contact Guard/Touching assist   Wheelchair 150 feet activity     Assist     Assist Level: Contact Guard/Touching assist      Medical Problem List and Plan: 1.  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            CIR PT, OT, SLP- plan D/C in am 2.  Antithrombotics: -DVT/anticoagulation:  SCDs             -antiplatelet therapy: N/A 3. Pain Management:  Tylenol as needed 4. Mood:  Provide emotional support             -antipsychotic agents: N/A 5. Neuropsych: This patient is capable of making decisions on his own behalf. 6. Skin/Wound Care:  Routine skin checks 7. Fluids/Electrolytes/Nutrition:  Routine in and out's with follow-up chemistries in the AM. 8. Hypertension. Norvasc 10 mg daily, clonidine 0.1 mg daily, chlorthalidone 25 mg daily, Cozaar 50 mg daily. Monitor with increased mobility.  Vitals:   07/07/18 2013 07/08/18 0501  BP: 120/73 126/75  Pulse: 62 63  Resp: 18 18  Temp: (!) 97.3 F (36.3 C) 98.7 F (37.1 C)  SpO2: 97% 99%  controlled 4/14 9. Diabetes mellitus. Hemoglobin A1c 6.8. SSI. Patient on Amaryl 4 mg daily and metformin 500 mg twice a day prior to admission. Resume as needed. Monitor with increased mobility.  CBG (last 3)  Recent Labs    07/07/18 1721 07/07/18 2102 07/08/18 0600  GLUCAP 85 105* 140*  overall improve on amaryl may need SSI at home would not resume metformin Cont SSI 10. Hyperlipidemia. Lipitor 11.  Sinus congestion flonase daily with prn ocean, add claritin 12.  CKD Creat ~1.7 in Sept 2019, looks stable LOS: 7 days A FACE TO FACE EVALUATION WAS PERFORMED  Charlett Blake 07/08/2018, 8:28 AM

## 2018-07-09 ENCOUNTER — Other Ambulatory Visit: Payer: Self-pay | Admitting: Nurse Practitioner

## 2018-07-09 DIAGNOSIS — I10 Essential (primary) hypertension: Secondary | ICD-10-CM

## 2018-07-09 LAB — BASIC METABOLIC PANEL
Anion gap: 11 (ref 5–15)
BUN: 43 mg/dL — ABNORMAL HIGH (ref 8–23)
CO2: 25 mmol/L (ref 22–32)
Calcium: 9.2 mg/dL (ref 8.9–10.3)
Chloride: 100 mmol/L (ref 98–111)
Creatinine, Ser: 1.88 mg/dL — ABNORMAL HIGH (ref 0.61–1.24)
GFR calc Af Amer: 40 mL/min — ABNORMAL LOW (ref >60)
GFR calc non Af Amer: 35 mL/min — ABNORMAL LOW (ref >60)
Glucose, Bld: 158 mg/dL — ABNORMAL HIGH (ref 70–99)
Potassium: 4.2 mmol/L (ref 3.5–5.1)
Sodium: 136 mmol/L (ref 135–145)

## 2018-07-09 LAB — GLUCOSE, CAPILLARY
Glucose-Capillary: 133 mg/dL — ABNORMAL HIGH (ref 70–99)
Glucose-Capillary: 135 mg/dL — ABNORMAL HIGH (ref 70–99)

## 2018-07-09 MED ORDER — ATORVASTATIN CALCIUM 10 MG PO TABS: 10.0000 mg | ORAL_TABLET | Freq: Every day | ORAL | 2 refills | Status: DC

## 2018-07-09 MED ORDER — VITAMIN E 180 MG (400 UNIT) PO CAPS: 400.0000 [IU] | ORAL_CAPSULE | Freq: Every day | ORAL | 3 refills | Status: AC

## 2018-07-09 MED ORDER — PANTOPRAZOLE SODIUM 40 MG PO TBEC: 40.0000 mg | DELAYED_RELEASE_TABLET | Freq: Every day | ORAL | 0 refills | Status: DC

## 2018-07-09 MED ORDER — AMLODIPINE BESYLATE 10 MG PO TABS: 10.0000 mg | ORAL_TABLET | Freq: Every day | ORAL | 3 refills | Status: DC

## 2018-07-09 MED ORDER — CLONIDINE HCL 0.1 MG PO TABS: 0.1000 mg | ORAL_TABLET | Freq: Every day | ORAL | 1 refills | Status: DC

## 2018-07-09 MED ORDER — GLIMEPIRIDE 4 MG PO TABS: 4.0000 mg | ORAL_TABLET | Freq: Every day | ORAL | 0 refills | Status: DC

## 2018-07-09 MED ORDER — LOSARTAN POTASSIUM 25 MG PO TABS: 50.0000 mg | ORAL_TABLET | Freq: Every day | ORAL | 1 refills | Status: DC

## 2018-07-09 MED ORDER — CHLORTHALIDONE 25 MG PO TABS: 25.0000 mg | ORAL_TABLET | Freq: Every day | ORAL | 3 refills | Status: DC

## 2018-07-09 NOTE — Progress Notes (Signed)
[facility] PHYSICAL MEDICINE & REHABILITATION PROGRESS NOTE   Subjective/Complaints: No new issues no toe pain ROS- Negative CP, SOB, N/V/D                                                                                 Objective:   No results found. No results for input(s): WBC, HGB, HCT, PLT in the last 72 hours. Recent Labs    07/09/18 0635  NA 136  K 4.2  CL 100  CO2 25  GLUCOSE 158*  BUN 43*  CREATININE 1.88*  CALCIUM 9.2    Intake/Output Summary (Last 24 hours) at 07/09/2018 1026 Last data filed at 07/09/2018 0700 Gross per 24 hour  Intake 240 ml  Output 1275 ml  Net -1035 ml     Physical Exam: Vital Signs Blood pressure 129/78, pulse 63, temperature 98 F (36.7 C), resp. rate 18, height 5\' 7"  (1.702 m), weight 72.1 kg, SpO2 98 %.   General: No acute distress Mood and affect are appropriate Heart: Regular rate and rhythm no rubs murmurs or extra sounds Lungs: Clear to auscultation, breathing unlabored, no rales or wheezes Abdomen: Positive bowel sounds, soft nontender to palpation, nondistended Extremities: No clubbing, cyanosis, or edema, no erythema R 1st MTP, no pain with active ROM Skin: No evidence of breakdown, no evidence of rash Neurologic: Cranial nerves II through XII intact, motor strength is 5/5 in bilateral deltoid, bicep, tricep, grip, hip flexor, knee extensors, ankle dorsiflexor and plantar flexor Sensory exam normal sensation to light touch and proprioception in bilateral upper and lower extremities + 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 No joint swelling   Assessment/Plan: 1. Functional deficits secondary to RIght ICH  Discussed stroke prevention with emphasis on BP management Stable for D/C today F/u PCP in 3-4 weeks F/u PM&R 2 weeks See D/C summary See D/C instructions Care  Tool:  Bathing    Body parts bathed by patient: Right arm, Left arm, Chest, Abdomen, Front perineal area, Buttocks, Right upper leg, Left upper leg, Right lower leg, Left lower leg, Face         Bathing assist Assist Level: Independent     Upper Body Dressing/Undressing Upper body dressing   What is the patient wearing?: Pull over shirt    Upper body assist Assist Level: Independent with assistive device    Lower Body Dressing/Undressing Lower body dressing      What is the patient wearing?: Pants, Underwear/pull up     Lower body assist Assist for lower body dressing: Independent     Toileting Toileting    Toileting assist Assist for toileting: Independent with assistive device     Transfers Chair/bed transfer  Transfers assist     Chair/bed transfer assist level: Independent with assistive device     Locomotion Ambulation   Ambulation assist      Assist level: Independent with assistive device Assistive device: Rollator Max distance: 400   Walk 10 feet activity   Assist     Assist level: Independent with assistive device Assistive  device: Rollator   Walk 50 feet activity   Assist    Assist level: Independent with assistive device Assistive device: Rollator    Walk 150 feet activity   Assist    Assist level: Independent with assistive device Assistive device: Rollator    Walk 10 feet on uneven surface  activity   Assist     Assist level: Independent with assistive device Assistive device: Rollator   Wheelchair     Assist Will patient use wheelchair at discharge?: No Type of Wheelchair: Manual    Wheelchair assist level: Contact Guard/Touching assist Max wheelchair distance: 120    Wheelchair 50 feet with 2 turns activity    Assist        Assist Level: Contact Guard/Touching assist   Wheelchair 150 feet activity     Assist     Assist Level: Contact Guard/Touching assist      Medical Problem  List and Plan: 1.  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            CIR PT, OT, SLP- plan D/C today 2.  Antithrombotics: -DVT/anticoagulation:  SCDs             -antiplatelet therapy: N/A 3. Pain Management:  Tylenol as needed 4. Mood:  Provide emotional support             -antipsychotic agents: N/A 5. Neuropsych: This patient is capable of making decisions on his own behalf. 6. Skin/Wound Care:  Routine skin checks 7. Fluids/Electrolytes/Nutrition:  Routine in and out's with follow-up chemistries in the AM. 8. Hypertension. Norvasc 10 mg daily, clonidine 0.1 mg daily, chlorthalidone 25 mg daily, Cozaar 50 mg daily. Monitor with increased mobility.  Vitals:   07/08/18 2023 07/09/18 0549  BP: 124/78 129/78  Pulse:  63  Resp:  18  Temp:  98 F (36.7 C)  SpO2:  98%  controlled 4/15 9. Diabetes mellitus. Hemoglobin A1c 6.8. SSI. Patient on Amaryl 4 mg daily and metformin 500 mg twice a day prior to admission. Resume as needed. Monitor with increased mobility.  CBG (last 3)  Recent Labs    07/08/18 1731 07/08/18 2120 07/09/18 0550  GLUCAP 98 188* 133*  overall improve on amaryl may need SSI at home would not resume metformin Cont SSI 10. Hyperlipidemia. Lipitor 11.  Sinus congestion flonase daily with prn ocean, add claritin 12.  CKD Creat ~1.7 in Sept 2019, looks stable LOS: 8 days A FACE TO FACE EVALUATION WAS PERFORMED  Charlett Blake 07/09/2018, 10:26 AM

## 2018-07-09 NOTE — Patient Care Conference (Cosign Needed Addendum)
Inpatient RehabilitationTeam Conference and Plan of Care Update Date: 07/09/2018   Time: 8:29 AM    Patient Name: Peter York      Medical Record Number: [id number]  Date of Birth: 10-16-1945 Sex: Male         Room/Bed: 4W26C/4W26C-01 Payor Info: Payor: [employer] / Plan: [employer] PPO / Product Type: *No Product type* /    Admitting Diagnosis: right basal ganglia hemorrhage  Admit Date/Time:  07/01/2018  4:02 PM Admission Comments: No comment available   Primary Diagnosis:  <principal problem not specified> Principal Problem: <principal problem not specified>  Patient Active Problem List   Diagnosis Date Noted  . Hemorrhagic stroke ([place])   . Left-sided weakness   . Benign essential HTN   . Diabetes mellitus type 2 in nonobese (HCC)   . ICH (intracerebral hemorrhage) ([facility]CC) hypertensive R Basal ganglia bleed 06/29/2018  . Stroke (cerebrum) ([place]) 06/29/2018  . 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 Compression fracture of thoracic vertebra ([place]) 04/11/2016  . Family history of coronary arteriosclerosis 04/07/2015  . Type 2 diabetes mellitus without complication ([place]) 14/43/1540  . Mixed hyperlipidemia 10/06/2012  . Hypertension 10/06/2012  . History of colonic polyps 07/19/2011    Expected Discharge Date: Expected Discharge Date: 07/09/18  Team Members Present: Physician leading conference: Dr. Alysia Penna Social Worker Present: Ovidio Kin, LCSW Nurse Present: Dorthula Nettles, RN PT Present: Barrie Folk, PT;[name], PTA OT Present: Darleen Crocker, OT SLP Present: Charolett Bumpers, SLP PPS Coordinator present : Gunnar Fusi     Current Status/Progress Goal Weekly Team Focus  Medical     ready for DC today   medically stable     Bowel/Bladder   continent of b/b; LBM: 04/14  remain continent of b/b  assist with tolieting needs prn    Swallow/Nutrition/ Hydration             ADL's   mod I UB self care and grooming with intermittent  supervision for bathing while standing in shower, toileting, and LB clothing management  Mod I overall, S for shower transfer  R NMR, balance, functional mobility, self care, and d/c planning   Mobility   mod I bed mobility, transfers with and without Rollator, mod I stairs car transfer, min A floor transfer  Mod I with LRAD at ambulatory levle   d/c planning, HEP development    Communication             Safety/Cognition/ Behavioral Observations            Pain   c/o generalized sinus headache; prn claritin and tylenol for treatment   pain level <2/10  assess pain q shift and prn; provided    Skin   abrasion and ecchymosis left arm   Remain free of new skin infection/breakdown; promote proper skin healing  assess skin q shift and prn      *See Care Plan and progress notes for long and short-term goals.     Barriers to Discharge  Current Status/Progress Possible Resolutions Date Resolved   Physician                    Nursing                  PT                    OT  SLP                SW                Discharge Planning/Teaching Needs:       Home with wife and daughter who are able to provide supervision level.   Team Discussion:  Met goals quickly and is at mod/i-supervision level. Medically stable and ready for DC today. BP managed .  Revisions to Treatment Plan:  DC 4/15        I attest that I was present, lead the team conference, and concur with the assessment and plan of the team. Teleconference held due to COVID-19   Elease Hashimoto 07/09/2018, 8:29 AM

## 2018-07-10 ENCOUNTER — Other Ambulatory Visit: Payer: Self-pay | Admitting: *Deleted

## 2018-07-10 NOTE — Progress Notes (Addendum)
Transitional Care Management  Telephone Note  07/10/2018   DISCHARGE INFORMATION Date of Discharge:07/09/2018 Discharge Facility: [facility] Discharge Diagnosis: hemorrhagic stroke  Outpatient Follow Up Recommendations (from discharge summary) Follow up with neurology and PCP in 1 to 2 weeks post discharge   Peter York is a male primary care patient of Peter Pretty, FNP who was discharged from a [facility] inpatient rehab facility on 07/09/2018 after being hospitalized for a hemorrhagic stroke. An outgoing telephone call was made and I spoke with the [name] directly. The patient is knowledgeable of his condition(s) and treatment and did not have an questions or concerns today.  ACTIVITIES OF DAILY LIVING  Mr Vandevoort lives at home with his wife and he is able to perform ADLs independently.  Home Health Services Patient is receiving home health physical therapy services. They are scheduled for an initial visit today.   MEDICATION RECONCILIATION His wife was picking up his medication from [facility] pharmacy during our telephone call.   A post discharge medication reconciliation was performed and the complete medication list was reviewed with the patient/caregiver and is current as of 07/10/2018. Patient reports that some changes to the time of day that he takes his medications.   Current Medication List Outpatient Encounter Medications as of 07/10/2018  Medication Sig  . acetaminophen (TYLENOL) 650 MG CR tablet Take 650 mg by mouth daily as needed for pain.  [name] amLODipine (NORVASC) 10 MG tablet Take 1 tablet (10 mg total) by mouth daily.  [name] atorvastatin (LIPITOR) 10 MG tablet Take 1 tablet (10 mg total) by mouth at bedtime.  . calcium carbonate (TUMS - DOSED IN MG ELEMENTAL CALCIUM) 500 MG chewable tablet Chew 1 tablet (200 mg of elemental calcium total) by mouth daily as needed for indigestion or heartburn.  . Calcium Carbonate-Vitamin D (CALCIUM-D PO) Take 1 tablet by mouth at  bedtime.  . chlorthalidone (HYGROTON) 25 MG tablet Take 1 tablet (25 mg total) by mouth daily.  . cholecalciferol (VITAMIN D) 1000 units tablet Take 1 tablet (1,000 Units total) by mouth at bedtime.  . cloNIDine (CATAPRES) 0.1 MG tablet Take 1 tablet (0.1 mg total) by mouth daily.  . cycloSPORINE (RESTASIS) 0.05 % ophthalmic emulsion Place 1 drop into both eyes 2 (two) times daily as needed (dry eyes).  [name] glimepiride (AMARYL) 4 MG tablet Take 1 tablet (4 mg total) by mouth daily with breakfast.  . glucose blood test strip USE TO CHECK BLOOD SUGAR ONCE A DAY AS INSTRUCTED  . losartan (COZAAR) 25 MG tablet TAKE 2 TABLETS BY MOUTH EVERY DAY  . pantoprazole (PROTONIX) 40 MG tablet Take 1 tablet (40 mg total) by mouth at bedtime.  . senna-docusate (SENOKOT-S) 8.6-50 MG tablet Take 1 tablet by mouth 2 (two) times daily.  . vitamin E 400 UNIT capsule Take 1 capsule (400 Units total) by mouth daily.    PATIENT EDUCATION . Take all medications as prescribed . Contact our office sooner than your scheduled appointment if you have any questions or concerns . The follow up visit with Shelah Lewandowsky will be by telephone due to [place] restrictions.   FOLLOW UP PLAN  An appointment for Transitional Care Management is scheduled as a telephone visit with Peter York, [place] on 07/16/2018.  Place referral for CCM if necessary   Chong Sicilian, RN-BC, BSN Nurse Case Manager [place] Ph: [phone]

## 2018-07-10 NOTE — Patient Instructions (Signed)
  PATIENT EDUCATION . Take all medications as prescribed . Contact our office at [phone] sooner than your scheduled appointment if you have any questions or concerns . The follow up visit with Shelah Lewandowsky will be by telephone due to [place] restrictions.  Chong Sicilian, RN-BC, BSN Nurse Case Manager [place] [phone]

## 2018-07-11 ENCOUNTER — Telehealth: Payer: Self-pay

## 2018-07-11 ENCOUNTER — Other Ambulatory Visit: Payer: Self-pay

## 2018-07-11 NOTE — Patient Outreach (Signed)
[facility]) Care Management  07/11/2018  Peter York 01-18-1946 [id number]    EMMI-Stroke RED ON EMMI ALERT Day # 1 Date: 07/10/2018 Red Alert Reason: " Scheduled follow up appt? No"   Outreach attempt # 1 to patient.Spoke with patient. He voices he is doing fairly well. He denies any acute issues or concerns at this time. Reviewed and addressed red alert with patient. Patient voices that per discharge paperwork Dr. Dianna Limbo office to call him and make appt. He is aware to f/u with MD office if he has not heard anything within the next few days. He had tele health visit with PCP on yesterday. He was also referred to [facility] and is awaiting appt to be scheduled. Patient unable to drive at present until cleared by MD but has supportive family to take him to appts. He confirms that he has all his meds. He voices that his insulin and Metformin was stopped and he is taking Amaryl. He is monitoring cbgs and will alert MD of elevated/abnormal cbgs. He denies any further RN CM needs or concerns at this time. Advised patient that they would continue to get automated EMMI-Stroke post discharge calls to assess how they are doing following recent hospitalization and will receive a call from a nurse if any of their responses were abnormal. Patient voiced understanding and was appreciative of f/u call.    Plan: RN CM will close case as no further interventions needed at this time.  Enzo Montgomery, RN,BSN,CCM [place] Management Telephonic Care Management Coordinator [place] Phone: [phone] Toll Free: [phone] Fax: [phone]

## 2018-07-14 NOTE — Telephone Encounter (Signed)
Transitional Care call-Patient    1. Are you/is patient experiencing any problems since coming home? No Are there any questions regarding any aspect of care? No 2. Are there any questions regarding medications administration/dosing? No Are meds being taken as prescribed? Yes Patient should review meds with caller to confirm 3. Have there been any falls? No 4. Has Home Health been to the house and/or have they contacted you? Yes but pt refused, he states he don't need it. If not, have you tried to contact them? Can we help you contact them? 5. Are bowels and bladder emptying properly? Yes Are there any unexpected incontinence issues? No If applicable, is patient following bowel/bladder programs? 6. Any fevers, problems with breathing, unexpected pain? No  7. Are there any skin problems or new areas of breakdown? No 8. Has the patient/family member arranged specialty MD follow up (ie cardiology/neurology/renal/surgical/etc)? None Can we help arrange? 9. Does the patient need any other services or support that we can help arrange? No 10. Are caregivers following through as expected in assisting the patient? Yes 11. Has the patient quit smoking, drinking alcohol, or using drugs as recommended? Yes  Appointment time 3:15 pm on July 18, 2018 with Dr. Letta Pate virtual visit [place]

## 2018-07-16 ENCOUNTER — Other Ambulatory Visit: Payer: Self-pay

## 2018-07-16 ENCOUNTER — Ambulatory Visit (INDEPENDENT_AMBULATORY_CARE_PROVIDER_SITE_OTHER): Payer: PPO | Admitting: Nurse Practitioner

## 2018-07-16 ENCOUNTER — Encounter: Payer: Self-pay | Admitting: Nurse Practitioner

## 2018-07-16 DIAGNOSIS — I6930 Unspecified sequelae of cerebral infarction: Secondary | ICD-10-CM | POA: Insufficient documentation

## 2018-07-16 DIAGNOSIS — E119 Type 2 diabetes mellitus without complications: Secondary | ICD-10-CM | POA: Diagnosis not present

## 2018-07-16 DIAGNOSIS — I10 Essential (primary) hypertension: Secondary | ICD-10-CM

## 2018-07-16 DIAGNOSIS — Z8673 Personal history of transient ischemic attack (TIA), and cerebral infarction without residual deficits: Secondary | ICD-10-CM | POA: Insufficient documentation

## 2018-07-16 MED ORDER — SEMAGLUTIDE(0.25 OR 0.5MG/DOS) 2 MG/1.5ML ~~LOC~~ SOPN: 0.2500 mg | PEN_INJECTOR | SUBCUTANEOUS | 5 refills | Status: DC

## 2018-07-16 NOTE — Progress Notes (Signed)
Patient ID: Peter York, male   DOB: 08-Oct-1945, 73 y.o.   MRN: [id number]    Virtual Visit via telephone Note  I connected with Lindell Noe on 07/16/18 at 8:40 AM by telephone and verified that I am speaking with the correct person using two identifiers. ELEFTHERIOS DUDENHOEFFER is currently located at home and his daughter is currently with her during visit. The provider, Mary-Margaret Hassell Done, FNP is located in their office at time of visit.  I discussed the limitations, risks, security and privacy concerns of performing an evaluation and management service by telephone and the availability of in person appointments. I also discussed with the patient that there may be a patient responsible charge related to this service. The patient expressed understanding and agreed to proceed.   History and Present Illness:  Today's visit is for Transitional Care Management.  The patient was discharged from [facility] on 07/09/18 with a primary diagnosis of late effect CVA.   Contact with the patient and/or caregiver, by a clinical staff member, was made on 07/10/18 and was documented as a telephone encounter within the EMR.  Through chart review and discussion with the patient I have determined that management of their condition is of moderate complexity.   * hospital records reviewed, including lab resilts  Chief Complaint: Hospitalization Follow-up    HPI:  1. Essential hypertension His blood pressure meds have not changed. He takes chlorthalidone and clonidine in the mornings and the amlodipine and losartan in the evenings. That routine has blood pressure running between 132-440 systolic.   2. Type 2 diabetes mellitus without complication, without long-term current use of insulin (HCC) Blood sugars have been running between 180-300 in mornings and afternoons. The hospital stopped his metformin due to kidney function so all he was taking was amaryl. 4 mg. So he started back on metformin and blood sugars  were still running high.  3. Late effect of cerebrovascular accident (CVA) Has slight weakness in left leg. Says that it feels heavy whne walking. He is able to ambulate without assistance.    Outpatient Encounter Medications as of 07/16/2018  Medication Sig   acetaminophen (TYLENOL) 650 MG CR tablet Take 650 mg by mouth daily as needed for pain.   amLODipine (NORVASC) 10 MG tablet Take 1 tablet (10 mg total) by mouth daily.   atorvastatin (LIPITOR) 10 MG tablet Take 1 tablet (10 mg total) by mouth at bedtime.   calcium carbonate (TUMS - DOSED IN MG ELEMENTAL CALCIUM) 500 MG chewable tablet Chew 1 tablet (200 mg of elemental calcium total) by mouth daily as needed for indigestion or heartburn.   Calcium Carbonate-Vitamin D (CALCIUM-D PO) Take 1 tablet by mouth at bedtime.   chlorthalidone (HYGROTON) 25 MG tablet Take 1 tablet (25 mg total) by mouth daily.   cholecalciferol (VITAMIN D) 1000 units tablet Take 1 tablet (1,000 Units total) by mouth at bedtime.   cloNIDine (CATAPRES) 0.1 MG tablet Take 1 tablet (0.1 mg total) by mouth daily.   cycloSPORINE (RESTASIS) 0.05 % ophthalmic emulsion Place 1 drop into both eyes 2 (two) times daily as needed (dry eyes).   glimepiride (AMARYL) 4 MG tablet Take 1 tablet (4 mg total) by mouth daily with breakfast.   glucose blood test strip USE TO CHECK BLOOD SUGAR ONCE A DAY AS INSTRUCTED   losartan (COZAAR) 25 MG tablet TAKE 2 TABLETS BY MOUTH EVERY DAY   pantoprazole (PROTONIX) 40 MG tablet Take 1 tablet (40 mg total) by mouth  at bedtime.   senna-docusate (SENOKOT-S) 8.6-50 MG tablet Take 1 tablet by mouth 2 (two) times daily.   vitamin E 400 UNIT capsule Take 1 capsule (400 Units total) by mouth daily.      New complaints: none  Social history: Lives with wife and daughter. They are all diabetics         Review of Systems  HENT: Negative.   Respiratory: Negative.   Cardiovascular: Negative.   Gastrointestinal: Negative.    Genitourinary: Negative.   Skin: Negative.   Neurological: Positive for weakness (left leg only).  Psychiatric/Behavioral: Negative.   All other systems reviewed and are negative.    Observations/Objective: JONAEL PARADISO comes in today with chief complaint of Hospitalization Follow-up   Diagnosis and orders addressed:  1. Essential hypertension Low sodium diet Continue blood pressure meds as he is taking Continue to keep diary of blood pressure  2. Type 2 diabetes mellitus without complication, without long-term current use of insulin (HCC) Stop metformin Continue amaryl as rx ozempic 0.25 weekly injection  3. Late effect of cerebrovascular accident (CVA) Continue daily exercises Fall prevention    Previous labs reviewed Health Maintenance reviewed Diet and exercise encouraged    Follow Up Instructions: 2 weeks    I discussed the assessment and treatment plan with the patient. The patient was provided an opportunity to ask questions and all were answered. The patient agreed with the plan and demonstrated an understanding of the instructions.   The patient was advised to call back or seek an in-person evaluation if the symptoms worsen or if the condition fails to improve as anticipated.  The above assessment and management plan was discussed with the patient. The patient verbalized understanding of and has agreed to the management plan. Patient is aware to call the clinic if symptoms persist or worsen. Patient is aware when to return to the clinic for a follow-up visit. Patient educated on when it is appropriate to go to the emergency department.    I provided 20 minutes of non-face-to-face time during this encounter.    Mary-Margaret Hassell Done, FNP

## 2018-07-18 ENCOUNTER — Ambulatory Visit (HOSPITAL_BASED_OUTPATIENT_CLINIC_OR_DEPARTMENT_OTHER): Payer: PPO | Admitting: Physical Medicine & Rehabilitation

## 2018-07-18 ENCOUNTER — Other Ambulatory Visit: Payer: Self-pay

## 2018-07-18 ENCOUNTER — Encounter: Payer: PPO | Attending: Physical Medicine & Rehabilitation

## 2018-07-18 ENCOUNTER — Encounter: Payer: Self-pay | Admitting: Physical Medicine & Rehabilitation

## 2018-07-18 DIAGNOSIS — I6930 Unspecified sequelae of cerebral infarction: Secondary | ICD-10-CM | POA: Diagnosis not present

## 2018-07-18 NOTE — Patient Instructions (Signed)

## 2018-07-18 NOTE — Progress Notes (Signed)
Subjective:  Patient consents to televisit  Patient ID: Peter York, male    DOB: [birth year], 73 y.o.   MRN: [id number] Peter York is a 73 year old right-handed male with history of hypertension maintained on Norvasc, clonidine, chlorthalidone, Cozaar as well as diabetes mellitus maintained on Amaryl and metformin and hyperlipidemia. Per chart review lives with spouse independent and active. Presented 06/29/2018 [facility] hospital with left side weakness and balance deficits. Blood pressure 170/82. CT of the head showed a 15 mm acute hematoma right basal ganglia internal capsule. CT angiogram of head showed no large vessel or medium vessel occlusion. Incidental findings of a 5.5 mm ACOM . Patient transferred to [facility] for further evaluation. Maintained on Cardene drip for blood pressure control. Follow-up MRI 06/30/2018 showed small hematoma right basal ganglia without evidence of underlying lesion. Neurology follow-up conservative care again close monitoring of blood pressure. Tolerating a regular diet. Therapy evaluations completed patient was admitted for a comprehensive rehabilitation program.   Admit date: 07/01/2018 Discharge date: 07/09/2018  HPI  Mod I bathing and dressing Walking 1 mile a day No falls Feeding 4 horses and 7 dogs  Left hand feels asleep but is working Eating well Bowel and bladder ok, not taking senna  No longer taking protonix  Seen by PCP started on Ozempic  BPs were a little low in am pt started taking BP meds 2 in am and 2 at night (losartan and amlodipine)  VIRTUAL VISIT _ patient is at home  Pain Inventory Average Pain 0 Pain Right Now 0 My pain is no pain  In the last 24 hours, has pain interfered with the following? General activity 0 Relation with others 0 Enjoyment of life 0 What TIME of day is your pain at its worst? no pain Sleep (in general) Good  Pain is worse with: no pain Pain improves with: no pain Relief from Meds:  no pain  Mobility walk without assistance ability to climb steps?  yes  Function retired  Neuro/Psych weakness tingling  Prior Studies Any changes since last visit?  no  Physicians involved in your care Primary care Peter Pretty NP has seen this week   Family History  Problem Relation Age of Onset  . Colon cancer Father 52  . Colon polyps Father   . Cancer Father 70       Colon  . Heart attack Maternal Uncle        X 4  . Heart disease Maternal Uncle   . Heart disease Maternal Uncle   . Heart disease Maternal Uncle   . Diabetes Daughter 13       Type 1  . Transient ischemic attack Brother 2   Social History   Socioeconomic History  . Marital status: Married    Spouse name: Not on file  . Number of children: 3  . Years of education: Not on file  . Highest education level: Not on file  Occupational History  . Occupation: retired  Scientific laboratory technician  . Financial resource strain: Not hard at all  . Food insecurity:    Worry: Never true    Inability: Never true  . Transportation needs:    Medical: No    Non-medical: No  Tobacco Use  . Smoking status: Former Smoker    Packs/day: 0.75    Years: 15.00    Pack years: 11.25    Types: Cigarettes    Last attempt to quit: 03/26/1978    Years since  quitting: 40.3  . Smokeless tobacco: Never Used  Substance and Sexual Activity  . Alcohol use: Yes    Comment: socially  . Drug use: No  . Sexual activity: Yes  Lifestyle  . Physical activity:    Days per week: 7 days    Minutes per session: 90 min  . Stress: Not at all  Relationships  . Social connections:    Talks on phone: More than three times a week    Gets together: More than three times a week    Attends religious service: Never    Active member of club or organization: No    Attends meetings of clubs or organizations: Never    Relationship status: Married  Other Topics Concern  . Not on file  Social History Narrative  . Not on file   Past  Surgical History:  Procedure Laterality Date  . ANAL FISTULECTOMY  10/02/2011   Procedure: FISTULECTOMY ANAL;  Surgeon: Joyice Faster. Cornett, MD;  Location: [facility];  Service: General;  Laterality: N/A;  excision perianal mass   . APPENDECTOMY    . COLONOSCOPY W/ POLYPECTOMY  12/31/2005   Dr. Silvano Rusk  . UMBILICAL HERNIA REPAIR     Past Medical History:  Diagnosis Date  . Adenomatous colon polyp 12/31/2005  . Diabetes mellitus   . Hypercholesterolemia   . Hypertension    There were no vitals taken for this visit.  Opioid Risk Score:   Fall Risk Score:  `1  Depression screen PHQ 2/9  Depression screen [facility] 2/9 07/18/2018 03/17/2018 12/12/2017 09/05/2017 07/22/2017 06/04/2017 02/26/2017  Decreased Interest 0 0 0 0 0 0 0  Down, Depressed, Hopeless 0 0 0 0 0 0 0  PHQ - 2 Score 0 0 0 0 0 0 0    Review of Systems  Constitutional: Negative.   HENT: Negative.   Eyes: Negative.   Respiratory: Negative.   Cardiovascular: Negative.   Gastrointestinal: Negative.   Endocrine: Negative.   Genitourinary: Negative.   Musculoskeletal: Negative.   Skin: Negative.   Allergic/Immunologic: Negative.   Neurological:       Left Hand still has tingling  Hematological: Negative.   Psychiatric/Behavioral: Negative.   All other systems reviewed and are negative.      Objective:   Physical Exam Nursing note reviewed.  Constitutional:      Appearance: Normal appearance.  Neurological:     General: No focal deficit present.     Mental Status: He is alert.  Psychiatric:        Mood and Affect: Mood normal.        Behavior: Behavior normal.    Speech without dysarthria        Assessment & Plan:  1.  Right basal ganglia hemorrhage, hypertensive, excellent functional recovery.  Still has some left upper extremity paresthesia versus fine motor deficits.  Difficult to say since this is a tele-visit. No need for further outpatient therapy. I do think he may gradually  return to driving with the following instructions  Graduated return to driving instructions were provided. It is recommended that the patient first drives with another licensed driver in an empty parking lot. If the patient does well with this, and they can drive on a quiet street with the licensed driver. If the patient does well with this they can drive on a busy street with a licensed driver. If the patient does well with this, the next time out they can go by himself. For  the first month after resuming driving, I recommend no nighttime or Interstate driving.   2.  Hypertension, patient has split up his medications and feels like this works better for him.  He will continue to monitor his blood pressure at home and follow-up with his primary care physician  3.  Diabetes, recently started by PCP on Ozempic patient will follow-up with Ronnald Collum  Physical medicine rehab follow-up on as-needed basis Patient would like to resume horseback riding I have instructed him to wait until his follow-up on June 3 with neurology and discuss this Duration of visit 13 minutes

## 2018-07-31 ENCOUNTER — Other Ambulatory Visit: Payer: Self-pay

## 2018-07-31 ENCOUNTER — Ambulatory Visit (INDEPENDENT_AMBULATORY_CARE_PROVIDER_SITE_OTHER): Payer: PPO | Admitting: Nurse Practitioner

## 2018-07-31 ENCOUNTER — Encounter: Payer: Self-pay | Admitting: Nurse Practitioner

## 2018-07-31 DIAGNOSIS — E119 Type 2 diabetes mellitus without complications: Secondary | ICD-10-CM | POA: Diagnosis not present

## 2018-07-31 MED ORDER — SEMAGLUTIDE(0.25 OR 0.5MG/DOS) 2 MG/1.5ML ~~LOC~~ SOPN: 1.0000 mg | PEN_INJECTOR | SUBCUTANEOUS | 5 refills | Status: DC

## 2018-07-31 NOTE — Progress Notes (Signed)
   Virtual Visit via telephone Note  I connected with Peter York on 07/31/18 at 9:15 AM by telephone and verified that I am speaking with the correct person using two identifiers. Peter York is currently located at home and no one is currently with her during visit. The provider, Mary-Margaret Hassell Done, FNP is located in their office at time of visit.  I discussed the limitations, risks, security and privacy concerns of performing an evaluation and management service by telephone and the availability of in person appointments. I also discussed with the patient that there may be a patient responsible charge related to this service. The patient expressed understanding and agreed to proceed.   History and Present Illness:   Chief Complaint: Diabetes   HPI Patient was in hospital a month and half ago and blood sugars have been all messed up since he came home. Fasting blood sugars have been high in mornings and then blood sugars below 120 in evening. Has had a few readings in the 70's. His diet has not changed.  He is currently on ozempic and amaryl.    Review of Systems  Constitutional: Negative for diaphoresis and weight loss.  Eyes: Negative for blurred vision, double vision and pain.  Respiratory: Negative for shortness of breath.   Cardiovascular: Negative for chest pain, palpitations, orthopnea and leg swelling.  Gastrointestinal: Negative for abdominal pain.  Skin: Negative for rash.  Neurological: Negative for dizziness, sensory change, loss of consciousness, weakness and headaches.  Endo/Heme/Allergies: Negative for polydipsia. Does not bruise/bleed easily.  Psychiatric/Behavioral: Negative for memory loss. The patient does not have insomnia.   All other systems reviewed and are negative.    Observations/Objective: Alert and oriented- answers all questions appropriately No distress  Assessment and Plan: DURREL MCNEE in today with chief complaint of Diabetes   1.  Type 2 diabetes mellitus without complication, without long-term current use of insulin (HCC) Increase ozempic to .5mg  for 2 weeks. If not much change in blood sugar then can increase to 1mg  daily. - Semaglutide,0.25 or 0.5MG /DOS, (OZEMPIC, 0.25 OR 0.5 MG/DOSE,) 2 MG/1.5ML SOPN; Inject 1 mg into the skin once a week.  Dispense: 4 pen; Refill: 5   Follow Up Instructions: Follow up in 2 weeks    I discussed the assessment and treatment plan with the patient. The patient was provided an opportunity to ask questions and all were answered. The patient agreed with the plan and demonstrated an understanding of the instructions.   The patient was advised to call back or seek an in-person evaluation if the symptoms worsen or if the condition fails to improve as anticipated.  The above assessment and management plan was discussed with the patient. The patient verbalized understanding of and has agreed to the management plan. Patient is aware to call the clinic if symptoms persist or worsen. Patient is aware when to return to the clinic for a follow-up visit. Patient educated on when it is appropriate to go to the emergency department.   Time call ended:  9:22 AM  I provided 15 minutes of non-face-to-face time during this encounter.    Mary-Margaret Hassell Done, FNP

## 2018-08-13 ENCOUNTER — Telehealth: Payer: Self-pay

## 2018-08-13 ENCOUNTER — Other Ambulatory Visit: Payer: Self-pay

## 2018-08-13 NOTE — Telephone Encounter (Signed)
Lou-anne from [facility] called in regards to this patient.  She was wanting to know if the patient would be healthy enough and tolerate a Dental Extraction since he is a post-stroke patient and would he be in need of any antibiotics prior to a dental cleaning.

## 2018-08-14 ENCOUNTER — Encounter: Payer: Self-pay | Admitting: Nurse Practitioner

## 2018-08-14 ENCOUNTER — Ambulatory Visit (INDEPENDENT_AMBULATORY_CARE_PROVIDER_SITE_OTHER): Payer: PPO | Admitting: Nurse Practitioner

## 2018-08-14 VITALS — BP 122/76 | HR 68 | Temp 97.9°F | Ht 67.0 in | Wt 156.0 lb

## 2018-08-14 DIAGNOSIS — E119 Type 2 diabetes mellitus without complications: Secondary | ICD-10-CM | POA: Diagnosis not present

## 2018-08-14 LAB — BAYER DCA HB A1C WAIVED: HB A1C (BAYER DCA - WAIVED): 6.5 % (ref <7.0)

## 2018-08-14 NOTE — Patient Instructions (Signed)
Diabetes Mellitus and Foot Care  Foot care is an important part of your health, especially when you have diabetes. Diabetes may cause you to have problems because of poor blood flow (circulation) to your feet and legs, which can cause your skin to:   Become thinner and drier.   Break more easily.   Heal more slowly.   Peel and crack.  You may also have nerve damage (neuropathy) in your legs and feet, causing decreased feeling in them. This means that you may not notice minor injuries to your feet that could lead to more serious problems. Noticing and addressing any potential problems early is the best way to prevent future foot problems.  How to care for your feet  Foot hygiene   Wash your feet daily with warm water and mild soap. Do not use hot water. Then, pat your feet and the areas between your toes until they are completely dry. Do not soak your feet as this can dry your skin.   Trim your toenails straight across. Do not dig under them or around the cuticle. File the edges of your nails with an emery board or nail file.   Apply a moisturizing lotion or petroleum jelly to the skin on your feet and to dry, brittle toenails. Use lotion that does not contain alcohol and is unscented. Do not apply lotion between your toes.  Shoes and socks   Wear clean socks or stockings every day. Make sure they are not too tight. Do not wear knee-high stockings since they may decrease blood flow to your legs.   Wear shoes that fit properly and have enough cushioning. Always look in your shoes before you put them on to be sure there are no objects inside.   To break in new shoes, wear them for just a few hours a day. This prevents injuries on your feet.  Wounds, scrapes, corns, and calluses   Check your feet daily for blisters, cuts, bruises, sores, and redness. If you cannot see the bottom of your feet, use a mirror or ask someone for help.   Do not cut corns or calluses or try to remove them with medicine.   If you  find a minor scrape, cut, or break in the skin on your feet, keep it and the skin around it clean and dry. You may clean these areas with mild soap and water. Do not clean the area with peroxide, alcohol, or iodine.   If you have a wound, scrape, corn, or callus on your foot, look at it several times a day to make sure it is healing and not infected. Check for:  ? Redness, swelling, or pain.  ? Fluid or blood.  ? Warmth.  ? Pus or a bad smell.  General instructions   Do not cross your legs. This may decrease blood flow to your feet.   Do not use heating pads or hot water bottles on your feet. They may burn your skin. If you have lost feeling in your feet or legs, you may not know this is happening until it is too late.   Protect your feet from hot and cold by wearing shoes, such as at the beach or on hot pavement.   Schedule a complete foot exam at least once a year (annually) or more often if you have foot problems. If you have foot problems, report any cuts, sores, or bruises to your health care provider immediately.  Contact a health care provider if:     You have a medical condition that increases your risk of infection and you have any cuts, sores, or bruises on your feet.   You have an injury that is not healing.   You have redness on your legs or feet.   You feel burning or tingling in your legs or feet.   You have pain or cramps in your legs and feet.   Your legs or feet are numb.   Your feet always feel cold.   You have pain around a toenail.  Get help right away if:   You have a wound, scrape, corn, or callus on your foot and:  ? You have pain, swelling, or redness that gets worse.  ? You have fluid or blood coming from the wound, scrape, corn, or callus.  ? Your wound, scrape, corn, or callus feels warm to the touch.  ? You have pus or a bad smell coming from the wound, scrape, corn, or callus.  ? You have a fever.  ? You have a red line going up your leg.  Summary   Check your feet every day  for cuts, sores, red spots, swelling, and blisters.   Moisturize feet and legs daily.   Wear shoes that fit properly and have enough cushioning.   If you have foot problems, report any cuts, sores, or bruises to your health care provider immediately.   Schedule a complete foot exam at least once a year (annually) or more often if you have foot problems.  This information is not intended to replace advice given to you by your health care provider. Make sure you discuss any questions you have with your health care provider.  Document Released: 03/09/2000 Document Revised: 04/24/2017 Document Reviewed: 04/13/2016  Elsevier Interactive Patient Education  2019 [employer]

## 2018-08-14 NOTE — Progress Notes (Signed)
   Subjective:    Patient ID: Peter York, male    DOB: 1945/08/10, 73 y.o.   MRN: [id number]   Chief Complaint: Recheck Hgb A1c   HPI Patient was in hospital for stroke at beginning of April. Since he got out f the hospital his blood sugars have been all over the place. He had a virtual visit on 07/31/18 . We increased his  ozempic to 0.5 and it has really helped. His blood sugars have been running  Below 150 since medication adjustment.  * he brought his blood pressure cuff in because at home has been over 381 systolic. We calibrated it with our machne and his was reading 15 points high.  Review of Systems  Constitutional: Negative for activity change and appetite change.  HENT: Negative.   Eyes: Negative for pain.  Respiratory: Negative for shortness of breath.   Cardiovascular: Negative for chest pain, palpitations and leg swelling.  Gastrointestinal: Negative for abdominal pain.  Endocrine: Negative for polydipsia.  Genitourinary: Negative.   Skin: Negative for rash.  Neurological: Negative for dizziness, weakness and headaches.  Hematological: Does not bruise/bleed easily.  Psychiatric/Behavioral: Negative.   All other systems reviewed and are negative.      Objective:   Physical Exam Vitals signs and nursing note reviewed.  Constitutional:      Appearance: Normal appearance. He is well-developed.  HENT:     Head: Normocephalic.     Nose: Nose normal.  Eyes:     Pupils: Pupils are equal, round, and reactive to light.  Neck:     Musculoskeletal: Normal range of motion and neck supple.     Thyroid: No thyroid mass or thyromegaly.     Vascular: No carotid bruit or JVD.     Trachea: Phonation normal.  Cardiovascular:     Rate and Rhythm: Normal rate and regular rhythm.  Pulmonary:     Effort: Pulmonary effort is normal. No respiratory distress.     Breath sounds: Normal breath sounds.  Abdominal:     General: Bowel sounds are normal.     Palpations: Abdomen is  soft.     Tenderness: There is no abdominal tenderness.  Musculoskeletal: Normal range of motion.  Lymphadenopathy:     Cervical: No cervical adenopathy.  Skin:    General: Skin is warm and dry.  Neurological:     Mental Status: He is alert and oriented to person, place, and time.  Psychiatric:        Behavior: Behavior normal.        Thought Content: Thought content normal.        Judgment: Judgment normal.    BP 122/76   Pulse 68   Temp 97.9 F (36.6 C) (Oral)   Ht 5\' 7"  (1.702 m)   Wt 156 lb (70.8 kg)   BMI 24.43 kg/m   hgba1c 6.5      Assessment & Plan:  Peter York comes in today with chief complaint of Recheck Hgb A1c   Diagnosis and orders addressed:  1. Type 2 diabetes mellitus without complication, without long-term current use of insulin (HCC) Continue current meds - Bayer DCA Hb A1c Waived   Follow up plan: 3 months   Mary-Margaret Hassell Done, FNP

## 2018-08-14 NOTE — Telephone Encounter (Signed)
[facility] needs to contact [facility]

## 2018-08-15 NOTE — Telephone Encounter (Signed)
Relayed information to [facility]

## 2018-08-22 ENCOUNTER — Telehealth: Payer: Self-pay

## 2018-08-22 NOTE — Telephone Encounter (Signed)

## 2018-08-25 ENCOUNTER — Telehealth (INDEPENDENT_AMBULATORY_CARE_PROVIDER_SITE_OTHER): Payer: PPO | Admitting: Cardiology

## 2018-08-25 ENCOUNTER — Encounter: Payer: Self-pay | Admitting: Cardiology

## 2018-08-25 ENCOUNTER — Other Ambulatory Visit: Payer: Self-pay

## 2018-08-25 VITALS — BP 114/75 | HR 73 | Ht 67.0 in | Wt 157.0 lb

## 2018-08-25 DIAGNOSIS — N183 Chronic kidney disease, stage 3 unspecified: Secondary | ICD-10-CM

## 2018-08-25 DIAGNOSIS — E119 Type 2 diabetes mellitus without complications: Secondary | ICD-10-CM | POA: Diagnosis not present

## 2018-08-25 DIAGNOSIS — I10 Essential (primary) hypertension: Secondary | ICD-10-CM | POA: Diagnosis not present

## 2018-08-25 DIAGNOSIS — E782 Mixed hyperlipidemia: Secondary | ICD-10-CM

## 2018-08-25 DIAGNOSIS — I629 Nontraumatic intracranial hemorrhage, unspecified: Secondary | ICD-10-CM

## 2018-08-25 MED ORDER — ATORVASTATIN CALCIUM 20 MG PO TABS: 20.0000 mg | ORAL_TABLET | Freq: Every day | ORAL | 3 refills | Status: DC

## 2018-08-25 NOTE — Progress Notes (Signed)
Virtual Visit via Video Note   This visit type was conducted due to national recommendations for restrictions regarding the COVID-19 Pandemic (e.g. social distancing) in an effort to limit this patient's exposure and mitigate transmission in our community.  Due to his co-morbid illnesses, this patient is at least at moderate risk for complications without adequate follow up.  This format is felt to be most appropriate for this patient at this time.  All issues noted in this document were discussed and addressed.  A limited physical exam was performed with this format.  Please refer to the patient's chart for his consent to telehealth for [facility].  Evaluation Performed:  Follow-up visit  This visit type was conducted due to national recommendations for restrictions regarding the COVID-19 Pandemic (e.g. social distancing).  This format is felt to be most appropriate for this patient at this time.  All issues noted in this document were discussed and addressed.  No physical exam was performed (except for noted visual exam findings with Video Visits).  Please refer to the patient's chart (MyChart message for video visits and phone note for telephone visits) for the patient's consent to telehealth for [facility].  Date:  08/25/2018   ID:  Peter York, DOB April 10, 1945, MRN [id number]  Patient Location:  Home  Provider location:   [place]  PCP:  Chevis Pretty, [place]  Cardiologist:   Electrophysiologist:  None   Chief Complaint:  HTN, lipids  History of Present Illness:    Peter York is a 73 y.o. male who presents via audio/video conferencing for a telehealth visit today.    Peter York is a 73 y.o. male with a hx of HTN, DM and dyslipidemia.  Since I saw him last he had an [place] secondary to hypertensive crisis. His ASA was stopped and he has essentially regained all his deficits after rehab.   He is here today for followup and is doing well.  he denies any chest pain or  pressure, SOB, DOE, PND, orthopnea, LE edema,  palpitations or syncope. Occasionally he will have some dizziness if he stands up too fast.  He is compliant with his meds and is tolerating meds with no SE.    The patient does not have symptoms concerning for COVID-19 infection (fever, chills, cough, or new shortness of breath).   Prior CV studies:   The following studies were reviewed today:  none  Past Medical History:  Diagnosis Date  . Adenomatous colon polyp 12/31/2005  . Diabetes mellitus   . Hypercholesterolemia   . Hypertension   . Intracranial hemorrhage ([place]) 2020   secondary to hypertensive crisis   Past Surgical History:  Procedure Laterality Date  . ANAL FISTULECTOMY  10/02/2011   Procedure: FISTULECTOMY ANAL;  Surgeon: Joyice Faster. Cornett, MD;  Location: [place];  Service: General;  Laterality: N/A;  excision perianal mass   . APPENDECTOMY    . COLONOSCOPY W/ POLYPECTOMY  12/31/2005   Dr. Silvano Rusk  . UMBILICAL HERNIA REPAIR       Current Meds  Medication Sig  . acetaminophen (TYLENOL) 650 MG CR tablet Take 650 mg by mouth daily as needed for pain.  [name] amLODipine (NORVASC) 10 MG tablet Take 1 tablet (10 mg total) by mouth daily.  [name] atorvastatin (LIPITOR) 10 MG tablet Take 1 tablet (10 mg total) by mouth at bedtime.  . Calcium Carbonate-Vitamin D (CALCIUM-D PO) Take 1 tablet by mouth at bedtime.  . chlorthalidone (HYGROTON) 25  MG tablet Take 1 tablet (25 mg total) by mouth daily.  . cloNIDine (CATAPRES) 0.1 MG tablet Take 1 tablet (0.1 mg total) by mouth daily.  . cycloSPORINE (RESTASIS) 0.05 % ophthalmic emulsion Place 1 drop into both eyes 2 (two) times daily as needed (dry eyes).  [name] glimepiride (AMARYL) 4 MG tablet Take 1 tablet (4 mg total) by mouth daily with breakfast.  . glucose blood test strip USE TO CHECK BLOOD SUGAR ONCE A DAY AS INSTRUCTED  . losartan (COZAAR) 50 MG tablet Take 50 mg by mouth daily.  . Semaglutide,0.25 or 0.5MG /DOS,  (OZEMPIC, 0.25 OR 0.5 MG/DOSE,) 2 MG/1.5ML SOPN Inject 1 mg into the skin once a week.  [name] Specialty Vitamins Products (PROSTATE PO) Take 1 tablet by mouth 2 (two) times a day. Super Beta Prostate  . vitamin E 400 UNIT capsule Take 1 capsule (400 Units total) by mouth daily.     Allergies:   Crestor [rosuvastatin calcium] and Benazepril   Social History   Tobacco Use  . Smoking status: Former Smoker    Packs/day: 0.75    Years: 15.00    Pack years: 11.25    Types: Cigarettes    Last attempt to quit: 03/26/1978    Years since quitting: 40.4  . Smokeless tobacco: Never Used  Substance Use Topics  . Alcohol use: Yes    Comment: socially  . Drug use: No     Family Hx: The patient's family history includes Cancer (age of onset: 23) in his father; Colon cancer (age of onset: 27) in his father; Colon polyps in his father; Diabetes (age of onset: 98) in his daughter; Heart attack in his maternal uncle; Heart disease in his maternal uncle, maternal uncle, and maternal uncle; Transient ischemic attack (age of onset: 62) in his brother.  ROS:   Please see the history of present illness.     All other systems reviewed and are negative.   Labs/Other Tests and Data Reviewed:    Recent Labs: 07/02/2018: ALT 14; Hemoglobin 12.1; Platelets 265 07/09/2018: BUN 43; Creatinine, Ser 1.88; Potassium 4.2; Sodium 136   Recent Lipid Panel Lab Results  Component Value Date/Time   CHOL 163 06/29/2018 02:01 PM   CHOL 170 03/17/2018 03:12 PM   CHOL 165 10/02/2012 08:34 AM   TRIG 177 (H) 06/29/2018 02:01 PM   TRIG 115 03/09/2014 08:18 AM   TRIG 95 10/02/2012 08:34 AM   HDL 31 (L) 06/29/2018 02:01 PM   HDL 32 (L) 03/17/2018 03:12 PM   HDL 43 03/09/2014 08:18 AM   HDL 51 10/02/2012 08:34 AM   CHOLHDL 5.3 06/29/2018 02:01 PM   LDLCALC 97 06/29/2018 02:01 PM   LDLCALC 98 03/17/2018 03:12 PM   LDLCALC 223 (H) 09/10/2013 09:05 AM   LDLCALC 95 10/02/2012 08:34 AM    Wt Readings from Last 3  Encounters:  08/25/18 157 lb (71.2 kg)  08/14/18 156 lb (70.8 kg)  07/02/18 159 lb 0.7 oz (72.1 kg)     Objective:    Vital Signs:  BP 114/75 (BP Location: Left Arm, Patient Position: Sitting, Cuff Size: Normal)   Pulse 73   Ht 5\' 7"  (1.702 m)   Wt 157 lb (71.2 kg)   BMI 24.59 kg/m    CONSTITUTIONAL:  Well nourished, well developed male in no acute distress.  EYES: anicteric MOUTH: oral mucosa is pink RESPIRATORY: Normal respiratory effort, symmetric expansion CARDIOVASCULAR: No peripheral edema SKIN: No rash, lesions or ulcers MUSCULOSKELETAL: no digital cyanosis NEURO:  Cranial Nerves II-XII grossly intact, moves all extremities PSYCH: Intact judgement and insight.  A&O x 3, Mood/affect appropriate   ASSESSMENT & PLAN:    1.  Hypertension - BP is well controlled on exam today.  He will continue on amlodipine 10mg  daily, chlorthalidone 25mg  daily, Clonidine 0.1mg  daily, Losartan 50mg  daily.  His last creatinine was stable at 1.88 on 07/09/2018.  2.  Type 2 DM - this if followed by his PCP.   His last HbA1C was 6.8.  He will continue on Semglutide and glimepiride.    3.  Hyperlipidemia - his LDL goal is < 70.  His LDL goal is < 70.  His LDL was 97 on 06/29/2018.  I will increase atorvastatin to 20mg  daily and repeat FLP and ALT in 6 weeks.   4.  CKD stage 3 - this is followed by his PCP.    5.  COVID-19 Education:The signs and symptoms of COVID-19 were discussed with the patient and how to seek care for testing (follow up with PCP or arrange E-visit).  The importance of social distancing was discussed today.  Patient Risk:   After full review of this patient's clinical status, I feel that they are at least moderate risk at this time.  Time:   Today, I have spent 10 minutes directly with the patient on video discussing medical problems including HTN, lipids.  We also reviewed the symptoms of COVID 19 and the ways to protect against contracting the virus with telehealth  technology.  I spent an additional 5 minutes reviewing patient's chart including labs and prior hospitalization for ICH.  Medication Adjustments/Labs and Tests Ordered: Current medicines are reviewed at length with the patient today.  Concerns regarding medicines are outlined above.  Tests Ordered: No orders of the defined types were placed in this encounter.  Medication Changes: No orders of the defined types were placed in this encounter.   Disposition:  Follow up in 1 year(s)  Signed, Fransico Him, MD  08/25/2018 11:07 AM    [facility]

## 2018-08-25 NOTE — Patient Instructions (Addendum)
Medication Instructions:  Increase: Lipitor 20 mg, daily, by mouth  If you need a refill on your cardiac medications before your next appointment, please call your pharmacy.   Lab work: Fasting Labs: Lipid and Liver on 10/08/18  If you have labs (blood work) drawn today and your tests are completely normal, you will receive your results only by: [name] MyChart Message (if you have MyChart) OR . A paper copy in the mail If you have any lab test that is abnormal or we need to change your treatment, we will call you to review the results.  Testing/Procedures: None  Follow-Up: At [facility], you and your health needs are our priority.  As part of our continuing mission to provide you with exceptional heart care, we have created designated Provider Care Teams.  These Care Teams include your primary Cardiologist (physician) and Advanced Practice Providers (APPs -  Physician Assistants and Nurse Practitioners) who all work together to provide you with the care you need, when you need it. You will need a follow up appointment in 1 years.  Please call our office 2 months in advance to schedule this appointment.  You may see Dr. Radford Pax or one of the following Advanced Practice Providers on your designated Care Team:   [place], PA-C Melina Copa, PA-C . Ermalinda Barrios, PA-C

## 2018-08-26 ENCOUNTER — Telehealth: Payer: Self-pay

## 2018-08-26 NOTE — Telephone Encounter (Signed)
I called pt that his visit will be video due to [place] 19. Pt gave verbal consent to do video and to file insurance.His cell carrier is [employer]. He was explain the doxy process. I stated link will be text today. Pt verbalized understanding.

## 2018-08-26 NOTE — Telephone Encounter (Signed)
Link text to pt for visit on August 28, 2018.

## 2018-08-27 ENCOUNTER — Ambulatory Visit: Payer: PPO | Admitting: Cardiology

## 2018-08-28 ENCOUNTER — Other Ambulatory Visit: Payer: Self-pay

## 2018-08-28 ENCOUNTER — Ambulatory Visit (INDEPENDENT_AMBULATORY_CARE_PROVIDER_SITE_OTHER): Payer: PPO | Admitting: Adult Health

## 2018-08-28 ENCOUNTER — Encounter: Payer: Self-pay | Admitting: Adult Health

## 2018-08-28 VITALS — BP 125/73

## 2018-08-28 DIAGNOSIS — I10 Essential (primary) hypertension: Secondary | ICD-10-CM | POA: Diagnosis not present

## 2018-08-28 DIAGNOSIS — E782 Mixed hyperlipidemia: Secondary | ICD-10-CM

## 2018-08-28 DIAGNOSIS — I671 Cerebral aneurysm, nonruptured: Secondary | ICD-10-CM | POA: Diagnosis not present

## 2018-08-28 DIAGNOSIS — I610 Nontraumatic intracerebral hemorrhage in hemisphere, subcortical: Secondary | ICD-10-CM | POA: Diagnosis not present

## 2018-08-28 DIAGNOSIS — E119 Type 2 diabetes mellitus without complications: Secondary | ICD-10-CM

## 2018-08-28 NOTE — Progress Notes (Signed)
[facility] [address] [place]. [place] [address] [phone]       VIRTUAL VISIT FOLLOW UP NOTE  Mr. Peter York Date of Birth:  1945-11-09 Medical Record Number:  [id number]   Reason for Referral:  hospital stroke follow up    Virtual Visit via Video Note  I connected with Peter York on 08/28/18 at  9:15 AM EDT by a video enabled telemedicine application located remotely in my own home and verified that I am speaking with the correct person using two identifiers who was located at their own home.   Visit scheduled by Katharine Look, RN. She discussed the limitations of evaluation and management by telemedicine and the availability of in person appointments. The patient expressed understanding and agreed to proceed.Please see telephone note for additional scheduling information and consent.    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 History obtained from patient and chart review. Reviewed all radiology images and labs personally.  Peter S Millsis a 73 y.o.malewith history of HTN, HLD, DBwho presented with L sided weakness and L hand weakness. 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  LDL 97 A1c 6.8.  BP initially elevated at 170/82 but stabilized during admission.  Recommended follow-up with Dr. Estanislado Pandy regarding aneurysm.  Other stroke risk factors include advanced age, former tobacco use and EtOH use but no prior history of stroke.  Residual deficits mild LUE weakness and discharged to  [facility] for ongoing therapy.  He has been stable from a stroke standpoint with mild residual left hand numbness/tingling with possible mild weakness. He does endorse increased fatigue quicker but improving overall. He has returned back to all prior activities. He has been ambulating without assistive device or falls. He plans on returning to horse back riding.  Continues on atorvastatin without side effects myalgias. Recent increase by cardiologist from 10mg  to 20mg . He is concerned regarding this due to prior history of statin myalgias. He has been expericing muscle aches since increasing dose. Blood pressure monitored at home with todays reading 125/73 prior to meds.  He has not experienced any hypotension episodes at home with his BP meds. He was previously taking aspirin 81mg  for the past 10-15 years due to underlying family hx of heart conditions. He denies personal history of heart disease. He does monitor glucose levels which has been stable. No further concerns at this time. Denies new or worsening stroke/TIA symptoms.      ROS:   14 system review of systems performed and negative with exception of weakness, numbness, muscle pains  PMH:  Past Medical History:  Diagnosis Date   Adenomatous colon polyp 12/31/2005   Diabetes mellitus    Hypercholesterolemia    Hypertension    Intracranial hemorrhage ([place]) 2020   secondary to hypertensive crisis    PSH:  Past Surgical History:  Procedure Laterality Date   ANAL FISTULECTOMY  10/02/2011   Procedure: FISTULECTOMY ANAL;  Surgeon: Joyice Faster. Cornett, MD;  Location: [place];  Service: General;  Laterality: N/A;  excision  perianal mass    APPENDECTOMY     COLONOSCOPY W/ POLYPECTOMY  12/31/2005   Dr. Silvano Rusk   UMBILICAL HERNIA REPAIR      Social History:  Social History   Socioeconomic History   Marital status: Married    Spouse name: Not on file   Number of children: 3   Years of education: Not on file     Highest education level: Not on file  Occupational History   Occupation: retired  Scientist, product/process development strain: Not hard at [employer] insecurity:    Worry: Never true    Inability: Never true   Transportation needs:    Medical: No    Non-medical: No  Tobacco Use   Smoking status: Former Smoker    Packs/day: 0.75    Years: 15.00    Pack years: 11.25    Types: Cigarettes    Last attempt to quit: 03/26/1978    Years since quitting: 40.4   Smokeless tobacco: Never Used  Substance and Sexual Activity   Alcohol use: Yes    Comment: socially   Drug use: No   Sexual activity: Yes  Lifestyle   Physical activity:    Days per week: 7 days    Minutes per session: 90 min   Stress: Not at all  Relationships   Social connections:    Talks on phone: More than three times a week    Gets together: More than three times a week    Attends religious service: Never    Active member of club or organization: No    Attends meetings of clubs or organizations: Never    Relationship status: Married   Intimate partner violence:    Fear of current or ex partner: No    Emotionally abused: No    Physically abused: No    Forced sexual activity: No  Other Topics Concern   Not on file  Social History Narrative   Not on file    Family History:  Family History  Problem Relation Age of Onset   Colon cancer Father 76   Colon polyps Father    Cancer Father 70       Colon   Heart attack Maternal Uncle        X 4   Heart disease Maternal Uncle    Heart disease Maternal Uncle    Heart disease Maternal Uncle    Diabetes Daughter 13       Type 1   Transient ischemic attack Brother 68    Medications:   Current Outpatient Medications on File Prior to Visit  Medication Sig Dispense Refill   acetaminophen (TYLENOL) 650 MG CR tablet Take 650 mg by mouth daily as needed for pain.     amLODipine (NORVASC) 10 MG tablet Take 1 tablet (10 mg total) by mouth  daily. 90 tablet 3   atorvastatin (LIPITOR) 20 MG tablet Take 1 tablet (20 mg total) by mouth daily. 90 tablet 3   Calcium Carbonate-Vitamin D (CALCIUM-D PO) Take 1 tablet by mouth at bedtime.     chlorthalidone (HYGROTON) 25 MG tablet Take 1 tablet (25 mg total) by mouth daily. 90 tablet 3   cloNIDine (CATAPRES) 0.1 MG tablet Take 1 tablet (0.1 mg total) by mouth daily. 180 tablet 1   cycloSPORINE (RESTASIS) 0.05 % ophthalmic emulsion Place 1 drop into both eyes 2 (two) times daily as needed (dry eyes).     glimepiride (AMARYL) 4 MG tablet  Take 1 tablet (4 mg total) by mouth daily with breakfast. 90 tablet 0   glucose blood test strip USE TO CHECK BLOOD SUGAR ONCE A DAY AS INSTRUCTED 100 each 8   losartan (COZAAR) 50 MG tablet Take 50 mg by mouth daily.     Semaglutide,0.25 or 0.5MG /DOS, (OZEMPIC, 0.25 OR 0.5 MG/DOSE,) 2 MG/1.5ML SOPN Inject 1 mg into the skin once a week. 4 pen 5   Specialty Vitamins Products (PROSTATE PO) Take 1 tablet by mouth 2 (two) times a day. Super Beta Prostate     vitamin E 400 UNIT capsule Take 1 capsule (400 Units total) by mouth daily. 30 capsule 3   No current facility-administered medications on file prior to visit.     Allergies:   Allergies  Allergen Reactions   Crestor [Rosuvastatin Calcium] Other (See Comments)    "like to killed me." joint pain, unable to get OOB   Benazepril Other (See Comments)    hyperkalemia     Physical Exam  Vitals:   08/28/18 0921  BP: 125/73   There is no height or weight on file to calculate BMI. No exam data present  Depression screen [facility] 2/9 08/28/2018  Decreased Interest 0  Down, Depressed, Hopeless 0  PHQ - 2 Score 0     General: well developed, well nourished, pleasant elderly Caucasian male, seated, in no evident distress Head: head normocephalic and atraumatic.     Neurologic Exam Mental Status: Awake and fully alert. Oriented to place and time. Recent and remote memory intact. Attention  span, concentration and fund of knowledge appropriate. Mood and affect appropriate.  Cranial Nerves: Extraocular movements full without nystagmus. Hearing intact to voice. Facial sensation intact. Face, tongue, palate moves normally and symmetrically.  Motor: no evidence of large muscle weakness per drift assessment. Slightly decreased left hand finger dexterity.  Sensory.: subjective decreased left hand sensation compared to right Coordination: Rapid alternating movements normal in all extremities except slightly diminished on left hand. Finger-to-nose and heel-to-shin performed accurately bilaterally. Gait and Station: Arises from chair without difficulty. Stance is normal. Gait demonstrates normal stride length and balance.  Reflexes:    NIHSS  1 Modified Rankin  1    Diagnostic Data (Labs, Imaging, Testing)  CT HEAD WO CONTRAST 06/29/2018 IMPRESSION: 1. 15 mm acute hematoma in the right basal ganglia/internal capsule. 2. Chronic small vessel disease. 3. Left maxillary sinusitis.  CT ANGIO HEAD W OR WO CONTRAST 06/29/2018 IMPRESSION: No large or medium vessel occlusion. No flow limiting proximal stenosis. Diffuse atherosclerotic change of the more distal intracranial branch vessels.  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 This would not relate to the right basal ganglia intraparenchymal hemorrhage.  [name] CONTRAST 06/30/2018 IMPRESSION: 1. Small hematoma in the right basal ganglia without evidence of underlying lesion. No evidence of amyloid angiopathy. Chronic small vessel disease is mild. 2. Obstructed left maxillary sinus.     ASSESSMENT: Peter York is a 73 y.o. 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  He has been stable from a stroke standpoint with excellent clinical recovery and only mild left hand weakness/numbness    PLAN:  1. ICH:  Continue atorvastatin for secondary stroke prevention.  Avoidance of aspirin, aspirin-containing products or ibuprofen products due to hemorrhage.  Maintain strict control of hypertension with blood pressure goal below 130/90, diabetes with hemoglobin A1c goal below 6.5% and  cholesterol with LDL cholesterol (bad cholesterol) goal below 70 mg/dL.  I also advised the patient to eat a healthy diet with plenty of whole grains, cereals, fruits and vegetables, exercise regularly with at least 30 minutes of continuous activity daily and maintain ideal body weight. 2. ACom aneurysm: Referral placed to vascular surgery Dr. Estanislado Pandy as recommended at hospital discharge for ongoing follow-up 3. HTN: Advised to continue current treatment regimen.  Today's BP 125/73.  Advised to continue to monitor at home along with continued follow-up with PCP for management 4. HLD: Advised to continue current treatment regimen along with continued follow-up with PCP for future prescribing and monitoring of lipid panel 5. DMII: Advised to continue to monitor glucose levels at home along with continued follow-up with PCP for management and monitoring    Follow up in 3 months or call earlier if needed   Greater than 50% of time during this 25 minute non-face-to-face visit was spent on counseling, explanation of diagnosis of ICH, reviewing risk factor management of HTN, HLD, DM and ACom aneurysm, planning of further management along with potential future management, and discussion with patient and family answering all questions.    Venancio Poisson, AGNP-BC  [facility] [address] [place], [place] [address]  Phone [phone] Fax [phone] Note: This document was prepared with digital dictation and possible smart phrase technology. Any transcriptional errors that result from this process are unintentional.

## 2018-08-29 NOTE — Progress Notes (Signed)
I agree with the above plan 

## 2018-09-02 ENCOUNTER — Telehealth: Payer: Self-pay | Admitting: Adult Health

## 2018-09-02 DIAGNOSIS — I671 Cerebral aneurysm, nonruptured: Secondary | ICD-10-CM

## 2018-09-02 NOTE — Telephone Encounter (Signed)
[facility]  called and stated that referral this case  Needs to be done buy neurosurgery. Janett Billow will you re - order thanks Hinton Dyer.

## 2018-09-03 NOTE — Telephone Encounter (Signed)
Referral placed to neurosurgery for further evaluation and monitoring of aneurysm

## 2018-09-03 NOTE — Addendum Note (Signed)
Addended by: Venancio Poisson on: 09/03/2018 10:11 AM   Modules accepted: Orders

## 2018-09-03 NOTE — Addendum Note (Signed)
Addended by: Venancio Poisson on: 09/03/2018 03:58 PM   Modules accepted: Orders

## 2018-09-08 DIAGNOSIS — I671 Cerebral aneurysm, nonruptured: Secondary | ICD-10-CM | POA: Diagnosis not present

## 2018-09-23 DIAGNOSIS — R972 Elevated prostate specific antigen [PSA]: Secondary | ICD-10-CM | POA: Diagnosis not present

## 2018-10-08 ENCOUNTER — Other Ambulatory Visit: Payer: Self-pay

## 2018-10-08 ENCOUNTER — Telehealth: Payer: Self-pay

## 2018-10-08 ENCOUNTER — Other Ambulatory Visit: Payer: PPO | Admitting: *Deleted

## 2018-10-08 DIAGNOSIS — E782 Mixed hyperlipidemia: Secondary | ICD-10-CM

## 2018-10-08 LAB — LIPID PANEL
Chol/HDL Ratio: 5.8 ratio — ABNORMAL HIGH (ref 0.0–5.0)
Cholesterol, Total: 161 mg/dL (ref 100–199)
HDL: 28 mg/dL — ABNORMAL LOW (ref >39)
LDL Calculated: 98 mg/dL (ref 0–99)
Triglycerides: 173 mg/dL — ABNORMAL HIGH (ref 0–149)
VLDL Cholesterol Cal: 35 mg/dL (ref 5–40)

## 2018-10-08 LAB — HEPATIC FUNCTION PANEL
ALT: 22 IU/L (ref 0–44)
AST: 22 IU/L (ref 0–40)
Albumin: 4.5 g/dL (ref 3.7–4.7)
Alkaline Phosphatase: 70 IU/L (ref 39–117)
Bilirubin Total: 0.5 mg/dL (ref 0.0–1.2)
Bilirubin, Direct: 0.12 mg/dL (ref 0.00–0.40)
Total Protein: 7.1 g/dL (ref 6.0–8.5)

## 2018-10-08 NOTE — Telephone Encounter (Signed)
-----   Message from Sueanne Margarita, MD sent at 10/08/2018  4:11 PM EDT ----- Lipids still not at goal - increase Lipitor to 40mg  daily and repeat FLP and ALT in 6 weeks

## 2018-10-08 NOTE — Telephone Encounter (Signed)
Notes recorded by Frederik Schmidt, RN on 10/08/2018 at 4:32 PM EDT  The patient has been notified of the result and verbalized understanding. All questions (if any) were answered.  Frederik Schmidt, RN 10/08/2018 4:31 PM   The patient is presently taking Lipitor 10 mg, because any more makes him feel lousy.  He is reluctant to increase medication.

## 2018-10-09 NOTE — Telephone Encounter (Signed)
Spoke with the patient, he accepted going to see lipid clinic again.

## 2018-10-09 NOTE — Telephone Encounter (Signed)
Refer to lipid clinic 

## 2018-10-14 ENCOUNTER — Telehealth: Payer: Self-pay

## 2018-10-14 NOTE — Telephone Encounter (Signed)

## 2018-10-15 NOTE — Progress Notes (Signed)
Patient ID: Peter York                 DOB: 14-May-1945                    MRN: [id number]     HPI: Peter York is a 73 y.o. male patient referred to lipid clinic by Dr. Radford Pax. 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 Pt was previously seen in lipid clinic - atorvastatin 10mg  was pt's highest tolerated statin dose. LDL most recently 98 drawn on 10/08/18 and pt was re-referred to lipid clinic to target LDL goal < 70.  Pt presents today in good spirits. He is tolerating atorvastatin 10mg  daily but previously experienced myalgias when he increased his dose to 20mg  daily. He is also intolerant to Livalo, rosuvastatin, Welchol, and ezetimibe. He does not recall specific side effect when he took Zetia - this was prescribed back in 2013 when med was branded and cost may have been an issue.   Pt also expresses concern over his BP medications. He has been checking his BP at home regularly, but feels very lethargic after taking his clonidine 0.1mg  in the AM (discharged from the hospital in April on clonidine after hemorrhagic stroke). He takes amlodipine and losartan in the evening and chlorthalidone and clonidine in the AM. Reports home readings consistently < 130/50mmHg. Typical systolic reading 254-270W per pt report.   Current Medications: atorvastatin 10 mg daily Prior medications: atorvastatin 10 mg, 20 mg; 40 mg (any doses > 10 mg make pt feel "lousy); pitavastatin 4 mg; rosuvastatin 20 mg, 40 mg (muscle aches); colesevelam 1875 mg; ezetimibe 10 mg; fenofibrate 145 mg; omega-3 fatty acid ethyl esters 2 grams (muscle aches) Risk Factors: HTN, HLD, T2DM LDL goal: <70 mg/dL per Dr Radford Pax  Diet: He does not eat meat much. He eats steak about 2 times per month. He does eat chicken and fish, but enjoys vegetables and salads. He prepared most boiled and occasional fried. He does add butter occasionally. He drinks  mostly water and some diet drinks.   Exercise: Walking 1 mile each day  Family History: father (colon cancer and colon polyps; age 3); brother (TIA; age 7) daughter (DM; age 62); maternal uncles (MI, heart disease)   Social History: former smoker (quit 1980; 11.25 pack year hx), drinks socially  Labs: 10/08/18: TC 161, HDL 28, LDL 98, TG 173 (atorvastatin 10mg  daily)  Past Medical History:  Diagnosis Date  . Adenomatous colon polyp 12/31/2005  . Diabetes mellitus   . Hypercholesterolemia   . Hypertension   . Intracranial hemorrhage ([facility]) 2020   secondary to hypertensive crisis    Current Outpatient Medications on File Prior to Visit  Medication Sig Dispense Refill  . acetaminophen (TYLENOL) 650 MG CR tablet Take 650 mg by mouth daily as needed for pain.    [name] amLODipine (NORVASC) 10 MG tablet Take 1 tablet (10 mg total) by mouth daily. 90 tablet 3  . atorvastatin (LIPITOR) 20 MG tablet Take 1 tablet (20 mg total) by mouth daily. 90 tablet 3  . Calcium Carbonate-Vitamin D (CALCIUM-D PO) Take 1 tablet by mouth at bedtime.    . chlorthalidone (HYGROTON) 25 MG tablet Take 1 tablet (25 mg total) by mouth daily. 90 tablet 3  . cloNIDine (CATAPRES) 0.1 MG tablet Take 1 tablet (0.1 mg total) by mouth daily. 180 tablet 1  . cycloSPORINE (RESTASIS) 0.05 %  ophthalmic emulsion Place 1 drop into both eyes 2 (two) times daily as needed (dry eyes).    [name] glimepiride (AMARYL) 4 MG tablet Take 1 tablet (4 mg total) by mouth daily with breakfast. 90 tablet 0  . glucose blood test strip USE TO CHECK BLOOD SUGAR ONCE A DAY AS INSTRUCTED 100 each 8  . losartan (COZAAR) 50 MG tablet Take 50 mg by mouth daily.    . Semaglutide,0.25 or 0.5MG /DOS, (OZEMPIC, 0.25 OR 0.5 MG/DOSE,) 2 MG/1.5ML SOPN Inject 1 mg into the skin once a week. 4 pen 5  . Specialty Vitamins Products (PROSTATE PO) Take 1 tablet by mouth 2 (two) times a day. Super Beta Prostate    . vitamin E 400 UNIT capsule Take 1 capsule (400 Units  total) by mouth daily. 30 capsule 3   No current facility-administered medications on file prior to visit.     Allergies  Allergen Reactions  . Crestor [Rosuvastatin Calcium] Other (See Comments)    "like to killed me." joint pain, unable to get OOB  . Benazepril Other (See Comments)    hyperkalemia    Assessment/Plan:  1. Hyperlipidemia - LDL currently 98mg /dL on max tolerated statin dose of atorvastatin 10mg  daily. LDL above goal < 70mg /dL. Will avoid Nexletol due to pt reported gout flares. Discussed PCSK9i and rechallenging with Zetia as pt does not recall adverse event with this medication. Pt wishes to retry Zetia 10mg  daily. Will continue atorvastatin 10mg  daily. Will assess tolerability at next PharmD visit and schedule f/u lab work at that time.  2. Hypertension - BP at goal <130/79mmHg at home, above goal in clinic today. Will stop clonidine 0.1mg  daily secondary to lethargy. Will increase losartan to 75mg  daily - pt prefers to split dose and take losartan 25mg  AM and 50mg  PM. Will continue amlodipine 10mg  daily and chlorthalidone 25mg  daily. Recheck BMET and BP in 2 weeks, pt advised to bring home BP cuff to clinic as home readings are running lower than office BP today.  Pt seen in clinic with PGY2 resident, Drexel Iha.  Megan E. Supple, PharmD, BCACP, [facility] [address], [place], [place] [address] Phone: [phone]; Fax: [phone] 10/16/2018 4:37 PM

## 2018-10-16 ENCOUNTER — Ambulatory Visit (INDEPENDENT_AMBULATORY_CARE_PROVIDER_SITE_OTHER): Payer: PPO | Admitting: Pharmacist

## 2018-10-16 ENCOUNTER — Other Ambulatory Visit: Payer: Self-pay

## 2018-10-16 VITALS — BP 146/80 | HR 75

## 2018-10-16 DIAGNOSIS — E782 Mixed hyperlipidemia: Secondary | ICD-10-CM

## 2018-10-16 DIAGNOSIS — I10 Essential (primary) hypertension: Secondary | ICD-10-CM | POA: Diagnosis not present

## 2018-10-16 MED ORDER — ATORVASTATIN CALCIUM 10 MG PO TABS: 10.0000 mg | ORAL_TABLET | Freq: Every day | ORAL | 3 refills | Status: DC

## 2018-10-16 MED ORDER — EZETIMIBE 10 MG PO TABS: 10.0000 mg | ORAL_TABLET | Freq: Every day | ORAL | 11 refills | Status: DC

## 2018-10-16 NOTE — Patient Instructions (Addendum)
Blood pressure: Goal < 130/31mmHg  Stop taking clonidine  Increase losartan to 25mg  in the morning and 50mg  in the evening  Continue to monitor your blood pressure  Cholesterol: Your LDL goal is < 70. Your LDL was 98 when it was checked on July 15th   Start taking ezetimibe (Zetia) 10mg  once daily for your cholesterol  Continue taking atorvastatin (Lipitor) 10mg  daily for your cholesterol  Follow up: Recheck kidney function and blood pressure in 2 weeks on Tuesday August 4th at 10am. Please bring your blood pressure cuff with you

## 2018-10-27 NOTE — Progress Notes (Signed)
Patient ID: Peter York                 DOB: 1945/05/03                    MRN: [id number]     HPI: Peter York is a 73 y.o. male patient referred to lipid clinic by Dr. Radford York. 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 Pt was previously seen in lipid clinic - atorvastatin 10mg  was pt's highest tolerated statin dose. LDL most recently 98 drawn on 10/08/18 and pt was re-referred to lipid clinic to target LDL goal < 70.   At last appt on 10/16/18, pt was tolerating atorvastatin 10 mg (experienced prior myalgias on atorvastatin 20 mg daily). Considering LDL was not at goal, ezetimibe 10 mg daily was re-initiated (pt did not recall specific side effect that occurred when he took Zetia - this was prescribed back in 2013 when med was branded and cost may have been an issue).  He also expressed concern regarding his BP meds at 10/16/18 appt. Pt reported that home BP readings were consistently <130/80 mmHg (typical systolic readings [id number]). However, clonidine has caused pt to experience lethargy, therefore, was D/C. Losartan was increased to 75 mg daily. Continued amlodipine and chlorthalidone.   Pt reports today for f/u HLD/HTN PharmD appt. He presents with the initial complaint of currently experiencing a gout attack on his middle finger of the left hand. He has taken colchicine 3 tabs on Aug 1st then 1 tab Aug 2nd to 4th for management. Took an APAP but stopped b/c his BP was high. States colchicine has helped. His last gout attack was in April 2020. He states he normally drinks cherry juice to help prevent gout flares, however, has not had any recently. Pt states he is not taking allopurinol - has follow up with PCP in a few weeks.  Pt presents with home BP readings which appear to be consistently at goal, except for the initial day of the gout flare. Pt is tolerating increased dose of losartan. He states he feels  significantly better after discontinuing clonidine and he has more energy. Pt also tolerating initiation of ezetimibe. Pt has brought in home BP machine to determine if readings differ from manual BP cuff. Pt's readings on home machine (154/82 and 146/80) and manual BP cuff (140/90 and 136/82) are elevated. Pt states he has taken his BP meds about 2 hours ago, however, typically notices his BP readings are elevated in clinic. Of note, pt has drank a diet coke and consumed a country ham biscuit today. Pt has not experienced any episodes of hypotension.   Current HLD medications: atorvastatin 10 mg daily; ezetimibe 10 mg daily Prior HLD medications: atorvastatin 10 mg, 20 mg; 40 mg (any doses > 10 mg make pt feel "lousy); pitavastatin 4 mg; rosuvastatin 20 mg, 40 mg (muscle aches); colesevelam 1875 mg; ezetimibe 10 mg; fenofibrate 145 mg; omega-3 fatty acid ethyl esters 2 grams (muscle aches) Risk Factors: HTN, HLD, T2DM LDL goal: <70 mg/dL per Dr Peter York  Current HTN medications: losartan 25 mg QAM and 50 mg QPM, amlodipine 10 mg daily, chlorthalidone 25 mg daily Prior HTN medications: clonidine 0.1 mg daily (lethargy) BP goal: <130/80 mmHg  Diet: He does not eat meat much. He eats steak about 2 times per month. He does eat chicken and fish, but enjoys vegetables and salads. He prepared  most boiled and occasional fried. He does add butter occasionally. He drinks mostly water and some diet drinks.   Exercise: Walking 1 mile each day  Family History: father (colon cancer and colon polyps; age 44); brother (TIA; age 74) daughter (DM; age 43); maternal uncles (MI, heart disease)   Social History: former smoker (quit 1980; 11.25 pack year hx), drinks socially  Labs: Lipid Panel 10/08/18: TC 161, HDL 28, LDL 98, TG 173 (atorvastatin 10mg  daily)  Home BP readings (mmHg); 10/18/18 to 10/28/18.  125/78  127/80 130/82  114/76 123/72 121/80 130/74 141/86 114/77 104/91 120/78  Pulse ranges  64-74   Past Medical History:  Diagnosis Date   Adenomatous colon polyp 12/31/2005   Diabetes mellitus    Hypercholesterolemia    Hypertension    Intracranial hemorrhage ([facility]) 2020   secondary to hypertensive crisis    Current Outpatient Medications on File Prior to Visit  Medication Sig Dispense Refill   acetaminophen (TYLENOL) 650 MG CR tablet Take 650 mg by mouth daily as needed for pain.     amLODipine (NORVASC) 10 MG tablet Take 1 tablet (10 mg total) by mouth daily. 90 tablet 3   atorvastatin (LIPITOR) 10 MG tablet Take 1 tablet (10 mg total) by mouth daily. 90 tablet 3   Calcium Carbonate-Vitamin D (CALCIUM-D PO) Take 1 tablet by mouth at bedtime.     chlorthalidone (HYGROTON) 25 MG tablet Take 1 tablet (25 mg total) by mouth daily. 90 tablet 3   cycloSPORINE (RESTASIS) 0.05 % ophthalmic emulsion Place 1 drop into both eyes 2 (two) times daily as needed (dry eyes).     ezetimibe (ZETIA) 10 MG tablet Take 1 tablet (10 mg total) by mouth daily. 30 tablet 11   glimepiride (AMARYL) 4 MG tablet Take 1 tablet (4 mg total) by mouth daily with breakfast. 90 tablet 0   glucose blood test strip USE TO CHECK BLOOD SUGAR ONCE A DAY AS INSTRUCTED 100 each 8   losartan (COZAAR) 50 MG tablet Take 25mg  in the AM and 50mg  in the PM 90 tablet 3   Semaglutide,0.25 or 0.5MG /DOS, (OZEMPIC, 0.25 OR 0.5 MG/DOSE,) 2 MG/1.5ML SOPN Inject 1 mg into the skin once a week. 4 pen 5   Specialty Vitamins Products (PROSTATE PO) Take 1 tablet by mouth 2 (two) times a day. Super Beta Prostate     vitamin E 400 UNIT capsule Take 1 capsule (400 Units total) by mouth daily. 30 capsule 3   No current facility-administered medications on file prior to visit.     Allergies  Allergen Reactions   Crestor [Rosuvastatin Calcium] Other (See Comments)    "like to killed me." joint pain, unable to get OOB   Benazepril Other (See Comments)    hyperkalemia    Assessment/Plan:  1. Hyperlipidemia -  LDL goal < 70 mg/dL (secondary prevention). Pt is currently above goal. Pt was recently re-initiated on ezetimibe 10 mg daily and is tolerating. Will plan to obtain a fasting lipid panel/LFTs on October 6th 2020. If pt is still not at goal, plan to continue atorvastatin 10 mg daily (pt cannot tolerate higher doses) and ezetimibe 10 mg daily then add PCSK9 inhibitor -- can start evolocumab or alirocumab based on insurance ([employer]). Nexletol is not an option secondary to gout flares. Plan to follow up with pt after obtaining lipid panel/LFT results.     2. HTN - BP goal <130/80 mmHg. Pt's home BP readings show his readings are consistently at goal (  except for the day of the gout flare). Pt is tolerating losartan 25 mg QAM and 50 mg QPM, amlodipine 10 mg daily, and chlorthalidone 25 daily. Pt's home blood pressure cuff and manual cuff show elevated BP readings, which could be contributed to white coat HTN and diet (country ham biscuit, diet coke recently ingested). Pt's home BP monitor appears to show BP readings ~10-15 mmHg higher for his systolic reading compared to manual BP cuff.. Counseled pt to be cognizant of this and to call clinic if he notices any s/sx of hypotension, considering his BP readings at home are typically ~120/80 mmHg and may actually be lower. Will check results of today's BMP to ensure that K level is WNL considering recent increase in losartan dose. F/u in PharmD clinic if needed and informed pt if his BP readings are running in the 140/90s.  3. Gout flare: Currently managed with colchicine. Deferred management to PCP, specifically for potential allopurinol prophylaxis.    Thank you for allowing pharmacy to assist with providing Mr. Peter York care.  Drexel Iha, PharmD PGY2 Ambulatory Care Pharmacy Resident

## 2018-10-27 NOTE — Patient Instructions (Addendum)
It was a pleasure seeing you today at clinic Peter York!  Your blood pressure goal is less than 130/80 mmHg, which you are at! Congratulations!  Today the plan is....  1. Continue amlodipine, losartan, and chlorthalidone for your blood pressure and atorvastatin and ezetimibe for your cholesterol.  2. Continue checking your blood pressure at home. If you notice your BP readings are running greater than 140/90 mmHg please call us at [phone].  3. Follow up with your PCP about gout to see about any medications to prevent gout attacks.  4. Recheck cholesterol on Tuesday, October 6th. Come in any time after 7:30am for fasting labs

## 2018-10-28 ENCOUNTER — Other Ambulatory Visit: Payer: Self-pay

## 2018-10-28 ENCOUNTER — Other Ambulatory Visit: Payer: PPO

## 2018-10-28 ENCOUNTER — Ambulatory Visit (INDEPENDENT_AMBULATORY_CARE_PROVIDER_SITE_OTHER): Payer: PPO | Admitting: Pharmacist

## 2018-10-28 VITALS — BP 136/82 | HR 69

## 2018-10-28 DIAGNOSIS — I10 Essential (primary) hypertension: Secondary | ICD-10-CM | POA: Diagnosis not present

## 2018-10-28 DIAGNOSIS — E782 Mixed hyperlipidemia: Secondary | ICD-10-CM

## 2018-10-28 LAB — BASIC METABOLIC PANEL
BUN/Creatinine Ratio: 20 (ref 10–24)
BUN: 40 mg/dL — ABNORMAL HIGH (ref 8–27)
CO2: 21 mmol/L (ref 20–29)
Calcium: 9.4 mg/dL (ref 8.6–10.2)
Chloride: 99 mmol/L (ref 96–106)
Creatinine, Ser: 2.04 mg/dL — ABNORMAL HIGH (ref 0.76–1.27)
GFR calc Af Amer: 36 mL/min/{1.73_m2} — ABNORMAL LOW (ref >59)
GFR calc non Af Amer: 31 mL/min/{1.73_m2} — ABNORMAL LOW (ref >59)
Glucose: 141 mg/dL — ABNORMAL HIGH (ref 65–99)
Potassium: 4.3 mmol/L (ref 3.5–5.2)
Sodium: 137 mmol/L (ref 134–144)

## 2018-10-29 ENCOUNTER — Telehealth: Payer: Self-pay

## 2018-10-29 DIAGNOSIS — R7989 Other specified abnormal findings of blood chemistry: Secondary | ICD-10-CM

## 2018-10-29 DIAGNOSIS — I10 Essential (primary) hypertension: Secondary | ICD-10-CM

## 2018-10-29 MED ORDER — LOSARTAN POTASSIUM 50 MG PO TABS: 50.0000 mg | ORAL_TABLET | Freq: Every day | ORAL | 3 refills | Status: DC

## 2018-10-29 MED ORDER — HYDRALAZINE HCL 10 MG PO TABS: 10.0000 mg | ORAL_TABLET | Freq: Three times a day (TID) | ORAL | 3 refills | Status: DC

## 2018-10-29 NOTE — Telephone Encounter (Signed)
Notes recorded by Frederik Schmidt, RN on 10/29/2018 at 1:59 PM EDT  The patient has been notified of the result and verbalized understanding. All questions (if any) were answered.  Frederik Schmidt, RN 10/29/2018 1:59 PM

## 2018-10-29 NOTE — Telephone Encounter (Signed)
-----   Message from Sueanne Margarita, MD sent at 10/28/2018  6:21 PM EDT ----- Worsening renal function on higher dose of losartan.  Please decrease Losartan back to 50mg  daily and stop diuretic.  Please get a renal duplex to rule out renal artery stenosis.  Add Hydralazine 10mg  TID and followup in HTN clinic in 1 week.  Have him check BP daily for a week and bring readings with him to HTN clinic appt.

## 2018-10-30 ENCOUNTER — Encounter: Payer: Self-pay | Admitting: Cardiology

## 2018-10-30 ENCOUNTER — Other Ambulatory Visit: Payer: PPO

## 2018-10-30 ENCOUNTER — Other Ambulatory Visit: Payer: Self-pay

## 2018-10-30 ENCOUNTER — Ambulatory Visit (HOSPITAL_COMMUNITY)
Admission: RE | Admit: 2018-10-30 | Discharge: 2018-10-30 | Disposition: A | Discharge status: Home / Self Care | Payer: PPO | Source: Ambulatory Visit | Attending: Cardiology | Admitting: Cardiology

## 2018-10-30 DIAGNOSIS — I10 Essential (primary) hypertension: Secondary | ICD-10-CM | POA: Diagnosis not present

## 2018-10-30 DIAGNOSIS — I701 Atherosclerosis of renal artery: Secondary | ICD-10-CM | POA: Insufficient documentation

## 2018-10-30 DIAGNOSIS — R7989 Other specified abnormal findings of blood chemistry: Secondary | ICD-10-CM | POA: Diagnosis not present

## 2018-11-03 ENCOUNTER — Telehealth: Payer: Self-pay

## 2018-11-03 NOTE — Telephone Encounter (Signed)
Notes recorded by Frederik Schmidt, RN on 11/03/2018 at 8:31 AM EDT  The patient has been notified of the result and verbalized understanding. All questions (if any) were answered.  Frederik Schmidt, RN 11/03/2018 8:31 AM   ------

## 2018-11-03 NOTE — Telephone Encounter (Signed)
-----   Message from Sueanne Margarita, MD sent at 11/01/2018 12:02 PM EDT ----- Please correct prior note he has left renal artery stenosis of 1-59% (not 159%)

## 2018-11-10 ENCOUNTER — Telehealth: Payer: Self-pay | Admitting: *Deleted

## 2018-11-10 NOTE — Telephone Encounter (Signed)
Received medical clearance for dental treatment for pt.  Last seen 08-28-18 by JV/NP.   Form in in box.

## 2018-11-11 NOTE — Telephone Encounter (Signed)
Completed.

## 2018-11-11 NOTE — Telephone Encounter (Signed)
Fax confirmation received [facility] [phone].

## 2018-11-12 NOTE — Telephone Encounter (Signed)
I called Peter York.  I read off the clearance form what was written there.  Pt is not on any anticoagulants.  Does not require any premeds (not from neuro standpoint).  On atorvastatin that we prescribe.  R basal ganglia ICH.  Fax she received was not clear.  She will send back and if needed will call back.

## 2018-11-12 NOTE — Telephone Encounter (Signed)
LouAnne AF:HSVEXOG clearance for dental treatment has called stating the hand witting is not clear re: if pt is clear for treatment.  LouAnne is refaxing the form and asking if it can be clearly written if pt is clear to have the dental treatment

## 2018-11-17 ENCOUNTER — Ambulatory Visit: Payer: PPO | Admitting: Nurse Practitioner

## 2018-11-24 ENCOUNTER — Ambulatory Visit: Payer: Self-pay | Admitting: Nurse Practitioner

## 2018-11-24 ENCOUNTER — Telehealth: Payer: Self-pay | Admitting: Nurse Practitioner

## 2018-11-24 NOTE — Telephone Encounter (Signed)
done

## 2018-11-24 NOTE — Telephone Encounter (Signed)
Patient needs two teeth pulled and dentist wants clearance from pcp and neurology.  Neurology says he can get teeth pulled.  Patient wants to be sure Ms. Hassell Done clears him also.  He does not know if dentist sent a form.

## 2018-11-25 NOTE — Telephone Encounter (Signed)
Per Peter York filled out form and it was given to Peter York to be faxed to the dentist office.

## 2018-11-27 ENCOUNTER — Other Ambulatory Visit: Payer: Self-pay

## 2018-11-27 ENCOUNTER — Ambulatory Visit (INDEPENDENT_AMBULATORY_CARE_PROVIDER_SITE_OTHER): Payer: PPO | Admitting: Pharmacist

## 2018-11-27 VITALS — BP 130/76 | HR 73

## 2018-11-27 DIAGNOSIS — I10 Essential (primary) hypertension: Secondary | ICD-10-CM | POA: Diagnosis not present

## 2018-11-27 LAB — BASIC METABOLIC PANEL
BUN/Creatinine Ratio: 18 (ref 10–24)
BUN: 28 mg/dL — ABNORMAL HIGH (ref 8–27)
CO2: 22 mmol/L (ref 20–29)
Calcium: 9.3 mg/dL (ref 8.6–10.2)
Chloride: 102 mmol/L (ref 96–106)
Creatinine, Ser: 1.6 mg/dL — ABNORMAL HIGH (ref 0.76–1.27)
GFR calc Af Amer: 49 mL/min/{1.73_m2} — ABNORMAL LOW (ref >59)
GFR calc non Af Amer: 42 mL/min/{1.73_m2} — ABNORMAL LOW (ref >59)
Glucose: 226 mg/dL — ABNORMAL HIGH (ref 65–99)
Potassium: 4.8 mmol/L (ref 3.5–5.2)
Sodium: 137 mmol/L (ref 134–144)

## 2018-11-27 NOTE — Progress Notes (Signed)
Patient ID: Peter York                 DOB: [birth year]                      MRN: [id number]     HPI: Peter York is a pleasant 73 y.o. male referred by Dr. Radford Pax to HTN clinic. PMH is significant for HTN, T2DM, HLD, hemorrhagic stroke in April 2020 secondary to hypertensive crisis, and osteoporosis. On 8/4, renal function worsened (SCr 1.88 to 2.04) and losartan dose was decreased, chlorthalidone was stopped, renal duplex was ordered to rule out renal artery stenosis, and hydralazine 10mg  TID was added. Renal duplex showed 1-59% left renal artery stenosis. Pt presents today for blood pressure follow up.  Pt presents today in good spirits. Reports he has been feeling even better with his recent medication changes. He decreased his losartan dose from 75mg  to 25mg  instead of 50mg  daily and misses his 2nd dose of hydralazine about half of the week. He has been monitoring his BP at home each day, ranging 112/70-141/79. Home cuff runs ~83mmHg higher than clinic readings (confirmed by our office and PCP office), also reports his BP tends to run higher in the office than at home. BP today improved from 138/82 to 130/76 about 10 minutes later. He took his AM BP medications 2 hours ago, does not use NSAIDs, no caffeine today. He continues to stay active and eat a heart healthy diet.  Current HTN meds: amlodipine 10mg  daily (PM), hydralazine 10mg  TID, losartan 25mg  daily (AM) Previously tried: clonidine - lethargy, chlorthalidone - d/ced due to SCr increase BP goal: <130/69mmHg  Diet:He does not eat meat much. He eats steak about 2 times per month. He does eat chicken and fish, but enjoys vegetablesand salads. He prepared most boiled and occasional fried. He does add butter occasionally. He drinks mostly water and some diet drinks.  Exercise:Walking 1 mile each day  Family History:father (colon cancer and colon polyps; age 38); brother (TIA; age 71) daughter (DM; age 43); maternal uncles (MI, heart  disease)  Social History:former smoker (quit 1980; 11.25 pack year hx), drinks socially  Home BP readings: home cuff measured [id number] higher systolic (confirmed at our office and PCP office) - 112/70 - 141/79, mostly 120s/70, HR 60-70s  Wt Readings from Last 3 Encounters:  08/25/18 157 lb (71.2 kg)  08/14/18 156 lb (70.8 kg)  07/02/18 159 lb 0.7 oz (72.1 kg)   BP Readings from Last 3 Encounters:  10/28/18 136/82  10/16/18 (!) 146/80  08/28/18 125/73   Pulse Readings from Last 3 Encounters:  10/28/18 69  10/16/18 75  08/25/18 73    Renal function: CrCl cannot be calculated (Patient's most recent lab result is older than the maximum 21 days allowed.).  Past Medical History:  Diagnosis Date  . Adenomatous colon polyp 12/31/2005  . Diabetes mellitus   . Hypercholesterolemia   . Hypertension   . Intracranial hemorrhage ([place]) 2020   secondary to hypertensive crisis  . Renal artery stenosis ([facility])    Left renal artery 1-59% by dopplers 10/2018    Current Outpatient Medications on File Prior to Visit  Medication Sig Dispense Refill  . acetaminophen (TYLENOL) 650 MG CR tablet Take 650 mg by mouth daily as needed for pain.    [name] amLODipine (NORVASC) 10 MG tablet Take 1 tablet (10 mg total) by mouth daily. 90 tablet 3  . atorvastatin (LIPITOR) 10 MG tablet Take  1 tablet (10 mg total) by mouth daily. 90 tablet 3  . Calcium Carbonate-Vitamin D (CALCIUM-D PO) Take 1 tablet by mouth at bedtime.    . colchicine 0.6 MG tablet Take 0.6 mg by mouth daily.    . cycloSPORINE (RESTASIS) 0.05 % ophthalmic emulsion Place 1 drop into both eyes 2 (two) times daily as needed (dry eyes).    . ezetimibe (ZETIA) 10 MG tablet Take 1 tablet (10 mg total) by mouth daily. 30 tablet 11  . glimepiride (AMARYL) 4 MG tablet Take 1 tablet (4 mg total) by mouth daily with breakfast. 90 tablet 0  . glucose blood test strip USE TO CHECK BLOOD SUGAR ONCE A DAY AS INSTRUCTED 100 each 8  . hydrALAZINE (APRESOLINE)  10 MG tablet Take 1 tablet (10 mg total) by mouth 3 (three) times daily. 180 tablet 3  . losartan (COZAAR) 50 MG tablet Take 1 tablet (50 mg total) by mouth daily. 90 tablet 3  . Semaglutide,0.25 or 0.5MG /DOS, (OZEMPIC, 0.25 OR 0.5 MG/DOSE,) 2 MG/1.5ML SOPN Inject 1 mg into the skin once a week. 4 pen 5  . Specialty Vitamins Products (PROSTATE PO) Take 1 tablet by mouth 2 (two) times a day. Super Beta Prostate    . vitamin E 400 UNIT capsule Take 1 capsule (400 Units total) by mouth daily. 30 capsule 3   No current facility-administered medications on file prior to visit.     Allergies  Allergen Reactions  . Crestor [Rosuvastatin Calcium] Other (See Comments)    "like to killed me." joint pain, unable to get OOB  . Benazepril Other (See Comments)    hyperkalemia     Assessment/Plan:  1. Hypertension - BP at goal <130/30mmHg. Will recheck BMET today since stopping chlorthalidone and decreasing losartan dose. Pt will continue on losartan 25mg  daily, amlodipine 10mg  daily, and hydralazine 10mg  TID. He will try to remember his 2nd dose of hydralazine each day. Advised pt to monitor BP at home and call clinic if his BP increases above goal on a regular basis. F/u in HTN clinic as eneded.  Megan E. Supple, PharmD, BCACP, [place] [id number], [place], [place] [address] Phone: [phone]; Fax: [phone] 11/27/2018 9:42 AM

## 2018-11-27 NOTE — Patient Instructions (Addendum)
It was nice to see you today  Your blood pressure goal is < 130/27mmHg  Continue to take your blood pressure medications as follows: losartan 25mg  int he morning, amlodipine 10mg  in the evening, and hydralazine 10mg  3 times a day - try to work on getting in the 2nd dose of your hydralazine each day  Call clinic if your pressure increases > 130/60mmHg on a regular basis, [place] PharmD [phone]

## 2018-11-28 ENCOUNTER — Telehealth: Payer: Self-pay

## 2018-11-28 NOTE — Telephone Encounter (Signed)
-----   Message from Sueanne Margarita, MD sent at 11/27/2018 11:00 PM EDT ----- Creatinine improved after decreasing ARB and diuretic

## 2018-11-28 NOTE — Telephone Encounter (Signed)
Notes recorded by Frederik Schmidt, RN on 11/28/2018 at 8:36 AM EDT  The patient has been notified of the result and verbalized understanding. All questions (if any) were answered.  Frederik Schmidt, RN 11/28/2018 8:36 AM

## 2018-12-03 ENCOUNTER — Ambulatory Visit (INDEPENDENT_AMBULATORY_CARE_PROVIDER_SITE_OTHER): Payer: PPO | Admitting: Adult Health

## 2018-12-03 ENCOUNTER — Encounter: Payer: Self-pay | Admitting: Adult Health

## 2018-12-03 ENCOUNTER — Other Ambulatory Visit: Payer: Self-pay

## 2018-12-03 VITALS — BP 138/70 | HR 66 | Temp 97.3°F | Ht 68.0 in | Wt 161.0 lb

## 2018-12-03 DIAGNOSIS — E119 Type 2 diabetes mellitus without complications: Secondary | ICD-10-CM

## 2018-12-03 DIAGNOSIS — I610 Nontraumatic intracerebral hemorrhage in hemisphere, subcortical: Secondary | ICD-10-CM | POA: Diagnosis not present

## 2018-12-03 DIAGNOSIS — I671 Cerebral aneurysm, nonruptured: Secondary | ICD-10-CM

## 2018-12-03 DIAGNOSIS — E782 Mixed hyperlipidemia: Secondary | ICD-10-CM

## 2018-12-03 DIAGNOSIS — I10 Essential (primary) hypertension: Secondary | ICD-10-CM

## 2018-12-03 NOTE — Patient Instructions (Signed)
Continue lipitor  for secondary stroke prevention  Continue to follow up with PCP/cardiology regarding cholesterol, blood pressure and diabetes management   Continue to stay active and maintain a healthy diet  Continue to monitor blood pressure at home  Maintain strict control of hypertension with blood pressure goal below 130/90, diabetes with hemoglobin A1c goal below 6.5% and cholesterol with LDL cholesterol (bad cholesterol) goal below 70 mg/dL. I also advised the patient to eat a healthy diet with plenty of whole grains, cereals, fruits and vegetables, exercise regularly and maintain ideal body weight.         Thank you for coming to see [place] at [facility]. I hope we have been able to provide you high quality care today.  You may receive a patient satisfaction survey over the next few weeks. We would appreciate your feedback and comments so that we may continue to improve ourselves and the health of our patients.

## 2018-12-03 NOTE — Progress Notes (Signed)
[facility] [address] [place] [address] [phone]       OFFICE FOLLOW UP NOTE  Mr. Peter York Date of Birth:  [birth year] Medical Record Number:  [id number]   Reason for visit: f/u Right BG ICH 06/29/2018 [facility] provider: Dr. Leonie Man   CHIEF COMPLAINT:  Chief Complaint  Patient presents with  . Follow-up    Room 9, 3 month f/u. "Getting better everyday, no concerns"    HPI: Stroke admission 06/29/2018: Peter York a 73 y.o.malewith history of HTN, HLD, DBwho presented with L sided weakness and L hand weakness. 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  LDL 97 A1c 6.8.  BP initially elevated at 170/82 but stabilized during admission.  Recommended follow-up with Dr. Estanislado Pandy regarding aneurysm.  Other stroke risk factors include advanced age, former tobacco use and EtOH use but no prior history of stroke.  Residual deficits mild LUE weakness and discharged to CIR for ongoing therapy.  Virtual visit 08/28/2018: He has been stable from a stroke standpoint with mild residual left hand numbness/tingling with possible mild weakness. He does endorse increased fatigue quicker but improving overall. He has returned back to all prior activities. He has been ambulating without assistive device or falls. He plans on returning to horse back riding.  Continues on atorvastatin without side effects myalgias. Recent increase by cardiologist from 10mg  to 20mg . He is concerned regarding this due to prior history of statin myalgias. He has been expericing muscle aches since increasing dose. Blood pressure monitored at home with todays reading 125/73 prior to meds.  He has not experienced any hypotension episodes at home with his BP meds. He was previously taking aspirin 81mg  for the past 10-15 years due to underlying family hx of heart  conditions. He denies personal history of heart disease. He does monitor glucose levels which has been stable. No further concerns at this time. Denies new or worsening stroke/TIA symptoms.   Update 12/03/2018: Peter York is being seen today for stroke follow-up.  He has been doing well from a stroke standpoint with mild left hand numbness but able to function without difficulty.  Continues on atorvastatin 10 mg without myalgias along with recent addition of Zetia 10 mg daily.  Recent lipid panel showed LDL 98 and triglycerides 173 with recommended increase of atorvastatin dose to 40 mg daily but patient unable to tolerate.  Blood pressure 138/70. He does monitor at home and has been stable.  He continues to follow with cardiology regularly for HTN management with recent medication changes.  Glucose levels stable.  He did have follow-up with neurosurgery regarding cerebral aneurysm surveillance monitoring on 09/08/2018 and plans on follow-up in 1 year.  Denies new or worsening stroke/TIA symptoms.   ROS:   14 system review of systems performed and negative with exception of numbness  PMH:  Past Medical History:  Diagnosis Date  . Adenomatous colon polyp 12/31/2005  . Diabetes mellitus   . Hypercholesterolemia   . Hypertension   . Intracranial hemorrhage ([place]) 2020   secondary to hypertensive crisis  . Renal artery stenosis ([facility])    Left renal artery 1-59% by dopplers 10/2018    PSH:  Past Surgical History:  Procedure Laterality Date  . ANAL FISTULECTOMY  10/02/2011   Procedure: FISTULECTOMY ANAL;  Surgeon: Joyice Faster. Cornett, MD;  Location: [facility];  Service: General;  Laterality: N/A;  excision perianal mass   . APPENDECTOMY    . COLONOSCOPY W/ POLYPECTOMY  12/31/2005   Dr. Silvano Rusk  . UMBILICAL HERNIA REPAIR      Social History:  Social History   Socioeconomic History  . Marital status: Married    Spouse name: Not on file  . Number of children: 3  . Years of  education: Not on file  . Highest education level: Not on file  Occupational History  . Occupation: retired  Scientific laboratory technician  . Financial resource strain: Not hard at all  . Food insecurity    Worry: Never true    Inability: Never true  . Transportation needs    Medical: No    Non-medical: No  Tobacco Use  . Smoking status: Former Smoker    Packs/day: 0.75    Years: 15.00    Pack years: 11.25    Types: Cigarettes    Quit date: 03/26/1978    Years since quitting: 40.7  . Smokeless tobacco: Never Used  Substance and Sexual Activity  . Alcohol use: Yes    Comment: socially  . Drug use: No  . Sexual activity: Yes  Lifestyle  . Physical activity    Days per week: 7 days    Minutes per session: 90 min  . Stress: Not at all  Relationships  . Social connections    Talks on phone: More than three times a week    Gets together: More than three times a week    Attends religious service: Never    Active member of club or organization: No    Attends meetings of clubs or organizations: Never    Relationship status: Married  . Intimate partner violence    Fear of current or ex partner: No    Emotionally abused: No    Physically abused: No    Forced sexual activity: No  Other Topics Concern  . Not on file  Social History Narrative  . Not on file    Family History:  Family History  Problem Relation Age of Onset  . Colon cancer Father 46  . Colon polyps Father   . Cancer Father 93       Colon  . Heart attack Maternal Uncle        X 4  . Heart disease Maternal Uncle   . Heart disease Maternal Uncle   . Heart disease Maternal Uncle   . Diabetes Daughter 13       Type 1  . Transient ischemic attack Brother 75    Medications:   Current Outpatient Medications on File Prior to Visit  Medication Sig Dispense Refill  . acetaminophen (TYLENOL) 650 MG CR tablet Take 650 mg by mouth daily as needed for pain.    [name] amLODipine (NORVASC) 10 MG tablet Take 1 tablet (10 mg total) by  mouth daily. 90 tablet 3  . atorvastatin (LIPITOR) 10 MG tablet Take 1 tablet (10 mg total) by mouth daily. 90 tablet 3  . Calcium Carbonate-Vitamin D (CALCIUM-D PO) Take 1 tablet by mouth at bedtime.    . colchicine 0.6 MG tablet Take 0.6 mg by mouth daily.    . cycloSPORINE (RESTASIS) 0.05 % ophthalmic emulsion Place 1 drop into both eyes 2 (two) times daily as needed (dry eyes).    . ezetimibe (ZETIA) 10 MG tablet Take 1 tablet (10 mg total) by mouth daily. 30 tablet 11  . glimepiride (AMARYL) 4  MG tablet Take 1 tablet (4 mg total) by mouth daily with breakfast. 90 tablet 0  . glucose blood test strip USE TO CHECK BLOOD SUGAR ONCE A DAY AS INSTRUCTED 100 each 8  . hydrALAZINE (APRESOLINE) 10 MG tablet Take 1 tablet (10 mg total) by mouth 3 (three) times daily. 180 tablet 3  . losartan (COZAAR) 25 MG tablet Take 25 mg by mouth daily.    . Semaglutide,0.25 or 0.5MG /DOS, (OZEMPIC, 0.25 OR 0.5 MG/DOSE,) 2 MG/1.5ML SOPN Inject 1 mg into the skin once a week. 4 pen 5  . Specialty Vitamins Products (PROSTATE PO) Take 1 tablet by mouth 2 (two) times a day. Super Beta Prostate    . vitamin E 400 UNIT capsule Take 1 capsule (400 Units total) by mouth daily. 30 capsule 3   No current facility-administered medications on file prior to visit.     Allergies:   Allergies  Allergen Reactions  . Crestor [Rosuvastatin Calcium] Other (See Comments)    "like to killed me." joint pain, unable to get OOB  . Benazepril Other (See Comments)    hyperkalemia     Physical Exam  Vitals:   12/03/18 0849  BP: 138/70  Pulse: 66  Temp: (!) 97.3 F (36.3 C)  Weight: 161 lb (73 kg)  Height: 5\' 8"  (1.727 m)   Body mass index is 24.48 kg/m. No exam data present  General: well developed, well nourished, pleasant elderly Caucasian male seated, in no evident distress Head: head normocephalic and atraumatic.   Neck: supple with no carotid or supraclavicular bruits Cardiovascular: regular rate and rhythm, no  murmurs Musculoskeletal: no deformity Skin:  no rash/petichiae Vascular:  Normal pulses all extremities   Neurologic Exam Mental Status: Awake and fully alert. Oriented to place and time. Recent and remote memory intact. Attention span, concentration and fund of knowledge appropriate. Mood and affect appropriate.  Cranial Nerves: Fundoscopic exam reveals sharp disc margins. Pupils equal, briskly reactive to light. Extraocular movements full without nystagmus. Visual fields full to confrontation. Hearing intact. Facial sensation intact. Face, tongue, palate moves normally and symmetrically.  Motor: Normal bulk and tone. Normal strength in all tested extremity muscles. Sensory.: intact to touch , pinprick , position and vibratory sensation Coordination: Rapid alternating movements normal in all extremities except very mild decreased left hand dexterity. Finger-to-nose and heel-to-shin performed accurately bilaterally.  Gait and Station: Arises from chair without difficulty. Stance is normal. Gait demonstrates normal stride length and balance Reflexes: 1+ and symmetric. Toes downgoing.       Diagnostic Data (Labs, Imaging, Testing)  CT HEAD WO CONTRAST 06/29/2018 IMPRESSION: 1. 15 mm acute hematoma in the right basal ganglia/internal capsule. 2. Chronic small vessel disease. 3. Left maxillary sinusitis.  CT ANGIO HEAD W OR WO CONTRAST 06/29/2018 IMPRESSION: No large or medium vessel occlusion. No flow limiting proximal stenosis. Diffuse atherosclerotic change of the more distal intracranial branch vessels.  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 This would not relate to the right basal ganglia intraparenchymal hemorrhage.  [name] CONTRAST 06/30/2018 IMPRESSION: 1. Small hematoma in the right basal ganglia without evidence of underlying lesion. No evidence of amyloid angiopathy. Chronic small vessel disease is mild. 2. Obstructed left maxillary sinus.      ASSESSMENT: Peter York is a 73 y.o. 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  He has been stable from a stroke  standpoint with excellent clinical recovery with no residual mild left hand decreased dexterity and numbness    PLAN:  1. ICH: Continue atorvastatin for secondary stroke prevention.  Avoidance of aspirin, aspirin-containing products or ibuprofen products due to hemorrhage.  Maintain strict control of hypertension with blood pressure goal below 130/90, diabetes with hemoglobin A1c goal below 6.5% and cholesterol with LDL cholesterol (bad cholesterol) goal below 70 mg/dL.  I also advised the patient to eat a healthy diet with plenty of whole grains, cereals, fruits and vegetables, exercise regularly with at least 30 minutes of continuous activity daily and maintain ideal body weight. 2. ACom aneurysm: Plans on follow-up with neurosurgery in 1 year for surveillance monitoring 3. HTN: Advised to continue current treatment regimen.  Today's BP stable.  Advised to continue to monitor at home along with continued follow-up with PCP for management 4. HLD: Advised to continue current treatment regimen along with continued follow-up with PCP for future prescribing and monitoring of lipid panel 5. DMII: Advised to continue to monitor glucose levels at home along with continued follow-up with PCP for management and monitoring   Overall stable from stroke standpoint with regular follow-up by cardiology and PCP therefore recommend follow-up as an   Greater than 50% of time during this 25 minute visit was spent on counseling, explanation of diagnosis of ICH, reviewing risk factor management of HTN, HLD, DM and ACom aneurysm, planning of further management along with potential future management, and discussion with patient and family answering all questions.    Frann Rider, AGNP-BC  [facility] [address] [place], [place] [address]  Phone [phone] Fax [phone] Note: This document was prepared with digital dictation and possible smart phrase technology. Any transcriptional errors that result from this process are unintentional.

## 2018-12-04 NOTE — Progress Notes (Signed)
I agree with the above plan 

## 2018-12-05 DIAGNOSIS — H04123 Dry eye syndrome of bilateral lacrimal glands: Secondary | ICD-10-CM | POA: Diagnosis not present

## 2018-12-05 DIAGNOSIS — H40033 Anatomical narrow angle, bilateral: Secondary | ICD-10-CM | POA: Diagnosis not present

## 2018-12-16 ENCOUNTER — Ambulatory Visit: Payer: PPO | Admitting: Nurse Practitioner

## 2018-12-30 ENCOUNTER — Other Ambulatory Visit: Payer: PPO

## 2018-12-30 ENCOUNTER — Other Ambulatory Visit: Payer: Self-pay

## 2018-12-30 DIAGNOSIS — E782 Mixed hyperlipidemia: Secondary | ICD-10-CM

## 2018-12-30 LAB — LIPID PANEL
Chol/HDL Ratio: 4.5 ratio (ref 0.0–5.0)
Cholesterol, Total: 161 mg/dL (ref 100–199)
HDL: 36 mg/dL — ABNORMAL LOW (ref >39)
LDL Chol Calc (NIH): 92 mg/dL (ref 0–99)
Triglycerides: 195 mg/dL — ABNORMAL HIGH (ref 0–149)
VLDL Cholesterol Cal: 33 mg/dL (ref 5–40)

## 2018-12-30 LAB — HEPATIC FUNCTION PANEL
ALT: 25 IU/L (ref 0–44)
AST: 20 IU/L (ref 0–40)
Albumin: 4.3 g/dL (ref 3.7–4.7)
Alkaline Phosphatase: 76 IU/L (ref 39–117)
Bilirubin Total: 0.5 mg/dL (ref 0.0–1.2)
Bilirubin, Direct: 0.11 mg/dL (ref 0.00–0.40)
Total Protein: 7 g/dL (ref 6.0–8.5)

## 2018-12-31 MED ORDER — ATORVASTATIN CALCIUM 40 MG PO TABS: 40.0000 mg | ORAL_TABLET | Freq: Every day | ORAL | 1 refills | Status: DC

## 2018-12-31 NOTE — Progress Notes (Addendum)
Increase lipitor to 40mg  daily- cannot tolerate crestor

## 2018-12-31 NOTE — Addendum Note (Signed)
Addended by: Chevis Pretty on: 12/31/2018 12:31 PM   Modules accepted: Orders

## 2019-01-01 ENCOUNTER — Ambulatory Visit: Payer: PPO | Admitting: Nurse Practitioner

## 2019-01-02 ENCOUNTER — Telehealth: Payer: Self-pay | Admitting: Pharmacist

## 2019-01-02 DIAGNOSIS — E782 Mixed hyperlipidemia: Secondary | ICD-10-CM

## 2019-01-02 NOTE — Telephone Encounter (Signed)
Called pt on 01/02/19 at 3:50 PM and left HIPAA-compliant VM with instructions to call [facility] phone number back.  It appears pt's most recent LDL was 92 on 12/30/18 (decreased from LDL 98 on 10/08/18) and is still above goal < 70 mg/dL. Based on recent telephone notes, it appears pt takes Lipitor every other day and Zetia (unclear of exact dosing he is administering). When he takes Zetia with Lipitor it causes him to experience back ache pain.   Prior HLD medications: atorvastatin 10 mg, 20 mg; 40 mg (any doses > 10 mg make pt feel "lousy); pitavastatin 4 mg; rosuvastatin 20 mg, 40 mg (muscle aches); colesevelam 1875 mg; ezetimibe 10 mg; fenofibrate 145 mg; omega-3 fatty acid ethyl esters 2 grams (muscle aches)  Of note, pt is not a candidate for Nexlizet due to a history of gout flares.   Plan to clarify current HLD regimen and initiate PSCK9 inhibitor.

## 2019-01-05 NOTE — Telephone Encounter (Signed)
Called pt on 01/05/19 at 2:51 PM and left HIPAA-compliant VM with instructions to call [facility] phone number back

## 2019-01-05 NOTE — Telephone Encounter (Signed)
Patient returned phone call. He is taking atorvastatin 10mg  every other day and zetia everyother day. He decreased to qod due to back pain. He's not sure if pain has improved. Advised that we may need to consider Praluent or Repatha. Patient concerned about the cost. Advised that we can get him a grant to help with copay assistance. Appears patient is in the coverage gap. Patient would like to think about it and would like Stanton Kidney to call him tomorrow to discuss further. Advised that she could probably call him around 12:30

## 2019-01-06 NOTE — Telephone Encounter (Signed)
Called pt on 01/06/19 at 9:40 AM.  Pt confirmed he is taking atorvastatin 10 mg every other day and ezetimibe 10 mg every other day due to back pain. However, pt states he "forgets" sometimes and goes 2-3 days without taking either.   Discussed with pt how most recent LDL remains elevated above goal and he will need a PCSK9 inhibitor to lower LDL successfully since pt has exhausted all other options (has tried 3 statins, colesevelam, ezetimibe, fibrates, omega-3 fatty acids). He also is not a candidate for bempedoic acid due to his history of gout flares. Discussed side effects (musculoskeletal pain (3% with PCSK9i compared to up to 11% with statins) and injection site reactions (5.7%). Also, educated pt on efficacy of PCSK9 inhibitor lowering (~60%).   Informed pt that [place] are both tier 4 agents on insurance plan, which means they will cost $90/30 day supply and $180/90 day supply. Pt states that may be challenging to afford considering these agents are so expensive. Discussed with patient the [place] to help cover costs of medication. To qualify he will need to make between $86,880 - $108,600 (between 400-500% of [employer]) for a household of 3. Pt is unsure his total income at the moment and would like to plan a f/u telephone call to discuss affordability of PCSK9 inhibitor.  Plan to f/u with pt on 01/09/19.  If pt is agreeable to starting PCSK9 inhibitor, plan on initiating Praluent 75 mg subQ every 14 days, continuing atorvastatin 10 mg every other day, and discontinuing Zetia to simplify pt's regimen (adherence is a barrier).

## 2019-01-09 NOTE — Addendum Note (Signed)
Addended by: Ellwood Handler on: 01/09/2019 05:57 PM   Modules accepted: Orders

## 2019-01-09 NOTE — Telephone Encounter (Signed)
Called pt on 01/09/19.  Pt states he does not feel he will qualify for [employer] based based on income requirements.  Pt states he wants to try to take atorvastatin 10 mg and Zetia 10 mg on a daily basis and re-draw labs before initiation of PCSK9 inhibitor. Discussed how pt is elevated risk of ASCVD event at elevated LDL of 92; pt verbalized understanding and accepts risk. Informed pt lipid panel requires pt to be fasting; pt verbalized understanding. Scheduled labs for 03/09/19 at 9:00 AM.   Will f/u with pt on 03/10/19.

## 2019-02-03 ENCOUNTER — Ambulatory Visit (INDEPENDENT_AMBULATORY_CARE_PROVIDER_SITE_OTHER): Payer: PPO | Admitting: Nurse Practitioner

## 2019-02-03 ENCOUNTER — Other Ambulatory Visit: Payer: Self-pay

## 2019-02-03 ENCOUNTER — Encounter: Payer: Self-pay | Admitting: Nurse Practitioner

## 2019-02-03 VITALS — BP 137/86 | HR 65 | Temp 96.4°F | Ht 68.0 in | Wt 160.4 lb

## 2019-02-03 DIAGNOSIS — E782 Mixed hyperlipidemia: Secondary | ICD-10-CM | POA: Diagnosis not present

## 2019-02-03 DIAGNOSIS — E119 Type 2 diabetes mellitus without complications: Secondary | ICD-10-CM | POA: Diagnosis not present

## 2019-02-03 DIAGNOSIS — I10 Essential (primary) hypertension: Secondary | ICD-10-CM

## 2019-02-03 DIAGNOSIS — S22060D Wedge compression fracture of T7-T8 vertebra, subsequent encounter for fracture with routine healing: Secondary | ICD-10-CM | POA: Diagnosis not present

## 2019-02-03 DIAGNOSIS — I6930 Unspecified sequelae of cerebral infarction: Secondary | ICD-10-CM | POA: Diagnosis not present

## 2019-02-03 DIAGNOSIS — I619 Nontraumatic intracerebral hemorrhage, unspecified: Secondary | ICD-10-CM | POA: Diagnosis not present

## 2019-02-03 DIAGNOSIS — I701 Atherosclerosis of renal artery: Secondary | ICD-10-CM

## 2019-02-03 LAB — BAYER DCA HB A1C WAIVED: HB A1C (BAYER DCA - WAIVED): 5.9 % (ref <7.0)

## 2019-02-03 MED ORDER — LOSARTAN POTASSIUM 25 MG PO TABS: 25.0000 mg | ORAL_TABLET | Freq: Every day | ORAL | 1 refills | Status: DC

## 2019-02-03 MED ORDER — ATORVASTATIN CALCIUM 10 MG PO TABS: 10.0000 mg | ORAL_TABLET | Freq: Every day | ORAL | 1 refills | Status: DC

## 2019-02-03 MED ORDER — OZEMPIC (0.25 OR 0.5 MG/DOSE) 2 MG/1.5ML ~~LOC~~ SOPN: 1.0000 mg | PEN_INJECTOR | SUBCUTANEOUS | 5 refills | Status: DC

## 2019-02-03 MED ORDER — AMLODIPINE BESYLATE 10 MG PO TABS: 10.0000 mg | ORAL_TABLET | Freq: Every day | ORAL | 3 refills | Status: DC

## 2019-02-03 MED ORDER — EZETIMIBE 10 MG PO TABS: 10.0000 mg | ORAL_TABLET | Freq: Every day | ORAL | 1 refills | Status: DC

## 2019-02-03 MED ORDER — HYDRALAZINE HCL 10 MG PO TABS: 10.0000 mg | ORAL_TABLET | Freq: Three times a day (TID) | ORAL | 1 refills | Status: DC

## 2019-02-03 MED ORDER — GLIMEPIRIDE 4 MG PO TABS: 4.0000 mg | ORAL_TABLET | Freq: Every day | ORAL | 0 refills | Status: DC

## 2019-02-03 NOTE — Patient Instructions (Signed)

## 2019-02-03 NOTE — Progress Notes (Signed)
Subjective:    Patient ID: Peter York, male    DOB: 1945/05/30, 73 y.o.   MRN: [id number]   Chief Complaint: Medical Management of Chronic Issues    HPI:  1. Type 2 diabetes mellitus without complication, without long-term current use of insulin (HCC) fasting blood sugars are below 160 and usually run around 130-140. He denies any recent problems with low blood sugars. Lab Results  Component Value Date   HGBA1C 6.5 08/14/2018     2. Essential hypertension No c/o chest pain, sob or headache. Does not check blood pressure at home. Blood pressure is always elevated at Drs. Office. At home this morning his blood pressure was 129/79. BP Readings from Last 3 Encounters:  12/03/18 138/70  11/27/18 130/76  10/28/18 136/82     3. Mixed hyperlipidemia Does not watch diet but stays very active. Lab Results  Component Value Date   CHOL 161 12/30/2018   HDL 36 (L) 12/30/2018   LDLCALC 92 12/30/2018   TRIG 195 (H) 12/30/2018   CHOLHDL 4.5 12/30/2018    4. Late effect of cerebrovascular accident (CVA) Had a stroke earlier last year has done well. Has some very minor left sided weakness but is much better. Left hand feels lie it is asleep  5. Hemorrhagic stroke [facility]) As stated above.  6. Compression fracture of T7 vertebra with routine healing, subsequent encounter He rides horse everyday and sometimes for hours,  judging hunting dog competitions. They think that has contributed to his fractures in the past. Has no c/o   7. Renal artery stenosis (HCC) This was seen on doppler study in 10/2018. He denies any problems with blood pressure at home. His wife checks it frequently and has been below 010 systolic.    Outpatient Encounter Medications as of 02/03/2019  Medication Sig  . acetaminophen (TYLENOL) 650 MG CR tablet Take 650 mg by mouth daily as needed for pain.  [name] amLODipine (NORVASC) 10 MG tablet Take 1 tablet (10 mg total) by mouth daily.  [name] atorvastatin (LIPITOR) 40  MG tablet Take 1 tablet (40 mg total) by mouth daily.  . Calcium Carbonate-Vitamin D (CALCIUM-D PO) Take 1 tablet by mouth at bedtime.  . colchicine 0.6 MG tablet Take 0.6 mg by mouth daily.  . cycloSPORINE (RESTASIS) 0.05 % ophthalmic emulsion Place 1 drop into both eyes 2 (two) times daily as needed (dry eyes).  . ezetimibe (ZETIA) 10 MG tablet Take 1 tablet (10 mg total) by mouth daily.  [name] glimepiride (AMARYL) 4 MG tablet Take 1 tablet (4 mg total) by mouth daily with breakfast.  . glucose blood test strip USE TO CHECK BLOOD SUGAR ONCE A DAY AS INSTRUCTED  . hydrALAZINE (APRESOLINE) 10 MG tablet Take 1 tablet (10 mg total) by mouth 3 (three) times daily.  [name] losartan (COZAAR) 25 MG tablet Take 25 mg by mouth daily.  . Semaglutide,0.25 or 0.5MG/DOS, (OZEMPIC, 0.25 OR 0.5 MG/DOSE,) 2 MG/1.5ML SOPN Inject 1 mg into the skin once a week.  [name] Specialty Vitamins Products (PROSTATE PO) Take 1 tablet by mouth 2 (two) times a day. Super Beta Prostate  . vitamin E 400 UNIT capsule Take 1 capsule (400 Units total) by mouth daily.    Past Surgical History:  Procedure Laterality Date  . ANAL FISTULECTOMY  10/02/2011   Procedure: FISTULECTOMY ANAL;  Surgeon: Joyice Faster. Cornett, MD;  Location: [place];  Service: General;  Laterality: N/A;  excision perianal mass   . APPENDECTOMY    .  COLONOSCOPY W/ POLYPECTOMY  12/31/2005   Dr. Silvano Rusk  . UMBILICAL HERNIA REPAIR      Family History  Problem Relation Age of Onset  . Colon cancer Father 41  . Colon polyps Father   . Cancer Father 44       Colon  . Heart attack Maternal Uncle        X 4  . Heart disease Maternal Uncle   . Heart disease Maternal Uncle   . Heart disease Maternal Uncle   . Diabetes Daughter 13       Type 1  . Transient ischemic attack Brother 29    New complaints: None today  Social history: Lives with wife who owns a Programmer, multimedia shop in town. 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   Controlled substance contract: n/a      Review of Systems  Constitutional: Negative for activity change and appetite change.  HENT: Negative.   Eyes: Negative for pain.  Respiratory: Negative for shortness of breath.   Cardiovascular: Negative for chest pain, palpitations and leg swelling.  Gastrointestinal: Negative for abdominal pain.  Endocrine: Negative for polydipsia.  Genitourinary: Negative.   Musculoskeletal:       Hand numbness  Skin: Negative for rash.  Neurological: Negative for dizziness, weakness and headaches.  Hematological: Does not bruise/bleed easily.  Psychiatric/Behavioral: Negative.   All other systems reviewed and are negative.      Objective:   Physical Exam Vitals signs and nursing note reviewed.  Constitutional:      Appearance: Normal appearance. He is well-developed.  HENT:     Head: Normocephalic.     Nose: Nose normal.  Eyes:     Pupils: Pupils are equal, round, and reactive to light.  Neck:     Musculoskeletal: Normal range of motion and neck supple.     Thyroid: No thyroid mass or thyromegaly.     Vascular: No carotid bruit or JVD.     Trachea: Phonation normal.  Cardiovascular:     Rate and Rhythm: Normal rate and regular rhythm.  Pulmonary:     Effort: Pulmonary effort is normal. No respiratory distress.     Breath sounds: Normal breath sounds.  Abdominal:     General: Bowel sounds are normal.     Palpations: Abdomen is soft.     Tenderness: There is no abdominal tenderness.  Musculoskeletal: Normal range of motion.     Comments: Left grip slightly weaker then right  Lymphadenopathy:     Cervical: No cervical adenopathy.  Skin:    General: Skin is warm and dry.  Neurological:     Mental Status: He is alert and oriented to person, place, and time.  Psychiatric:        Behavior: Behavior normal.        Thought Content: Thought content normal.        Judgment: Judgment normal.    BP 137/86 (BP Location: Left Arm)   Pulse 65   Temp (!) 96.4 F (35.8 C) (Temporal)    Ht _0  (1.727 m)   Wt 160 lb 6.4 oz (72.8 kg)   SpO2 100%   BMI 24.39 kg/m   hgba1c 5.9%       Assessment & Plan:  Peter York comes in today with chief complaint of Medical Management of Chronic Issues   Diagnosis and orders addressed:  1. Type 2 diabetes mellitus without complication, without long-term current use of insulin (HCC) Continue to watch carbs in diet -  hgba1c - Microalbumin / creatinine urine ratio - Semaglutide,0.25 or 0.5MG/DOS, (OZEMPIC, 0.25 OR 0.5 MG/DOSE,) 2 MG/1.5ML SOPN; Inject 1 mg into the skin once a week.  Dispense: 4 pen; Refill: 5 - glimepiride (AMARYL) 4 MG tablet; Take 1 tablet (4 mg total) by mouth daily with breakfast.  Dispense: 90 tablet; Refill: 0  2. Essential hypertension Low sodium diet - CMP14+EGFR - amLODipine (NORVASC) 10 MG tablet; Take 1 tablet (10 mg total) by mouth daily.  Dispense: 90 tablet; Refill: 3 - losartan (COZAAR) 25 MG tablet; Take 1 tablet (25 mg total) by mouth daily.  Dispense: 90 tablet; Refill: 1 - hydrALAZINE (APRESOLINE) 10 MG tablet; Take 1 tablet (10 mg total) by mouth 3 (three) times daily.  Dispense: 180 tablet; Refill: 1  3. Mixed hyperlipidemia Low fat diet - Lipid panel - atorvastatin (LIPITOR) 10 MG tablet; Take 1 tablet (10 mg total) by mouth daily.  Dispense: 90 tablet; Refill: 1 - ezetimibe (ZETIA) 10 MG tablet; Take 1 tablet (10 mg total) by mouth daily.  Dispense: 90 tablet; Refill: 1  4. Late effect of cerebrovascular accident (CVA)  5. Hemorrhagic stroke ([place])  6. Compression fracture of T7 vertebra with routine healing, subsequent encounter Weight  Bearing exercises encouraged  7. Renal artery stenosis ([place]) Will keep heck on this. Follow up in 1 year.   Labs pending Health Maintenance reviewed Diet and exercise encouraged  Follow up plan: 3 months   Mary-Margaret Hassell Done, FNP

## 2019-02-04 LAB — CMP14+EGFR
ALT: 23 IU/L (ref 0–44)
AST: 19 IU/L (ref 0–40)
Albumin/Globulin Ratio: 1.6 (ref 1.2–2.2)
Albumin: 4.5 g/dL (ref 3.7–4.7)
Alkaline Phosphatase: 70 IU/L (ref 39–117)
BUN/Creatinine Ratio: 15 (ref 10–24)
BUN: 26 mg/dL (ref 8–27)
Bilirubin Total: 0.4 mg/dL (ref 0.0–1.2)
CO2: 21 mmol/L (ref 20–29)
Calcium: 9.4 mg/dL (ref 8.6–10.2)
Chloride: 105 mmol/L (ref 96–106)
Creatinine, Ser: 1.78 mg/dL — ABNORMAL HIGH (ref 0.76–1.27)
GFR calc Af Amer: 43 mL/min/{1.73_m2} — ABNORMAL LOW (ref >59)
GFR calc non Af Amer: 37 mL/min/{1.73_m2} — ABNORMAL LOW (ref >59)
Globulin, Total: 2.8 g/dL (ref 1.5–4.5)
Glucose: 95 mg/dL (ref 65–99)
Potassium: 5.1 mmol/L (ref 3.5–5.2)
Sodium: 140 mmol/L (ref 134–144)
Total Protein: 7.3 g/dL (ref 6.0–8.5)

## 2019-02-04 LAB — LIPID PANEL
Chol/HDL Ratio: 3.1 ratio (ref 0.0–5.0)
Cholesterol, Total: 113 mg/dL (ref 100–199)
HDL: 36 mg/dL — ABNORMAL LOW (ref >39)
LDL Chol Calc (NIH): 62 mg/dL (ref 0–99)
Triglycerides: 74 mg/dL (ref 0–149)
VLDL Cholesterol Cal: 15 mg/dL (ref 5–40)

## 2019-02-04 LAB — MICROALBUMIN / CREATININE URINE RATIO
Creatinine, Urine: 121.5 mg/dL
Microalb/Creat Ratio: 118 mg/g creat — ABNORMAL HIGH (ref 0–29)
Microalbumin, Urine: 142.8 ug/mL

## 2019-02-05 ENCOUNTER — Ambulatory Visit (INDEPENDENT_AMBULATORY_CARE_PROVIDER_SITE_OTHER): Payer: PPO | Admitting: *Deleted

## 2019-02-05 DIAGNOSIS — Z0000 Encounter for general adult medical examination without abnormal findings: Secondary | ICD-10-CM | POA: Diagnosis not present

## 2019-02-05 NOTE — Patient Instructions (Signed)
Preventive Care 75 Years and Older, Male Preventive care refers to lifestyle choices and visits with your health care provider that can promote health and wellness. This includes:  A yearly physical exam. This is also called an annual well check.  Regular dental and eye exams.  Immunizations.  Screening for certain conditions.  Healthy lifestyle choices, such as diet and exercise. What can I expect for my preventive care visit? Physical exam Your health care provider will check:  Height and weight. These may be used to calculate body mass index (BMI), which is a measurement that tells if you are at a healthy weight.  Heart rate and blood pressure.  Your skin for abnormal spots. Counseling Your health care provider may ask you questions about:  Alcohol, tobacco, and drug use.  Emotional well-being.  Home and relationship well-being.  Sexual activity.  Eating habits.  History of falls.  Memory and ability to understand (cognition).  Work and work Statistician. What immunizations do I need?  Influenza (flu) vaccine  This is recommended every year. Tetanus, diphtheria, and pertussis (Tdap) vaccine  You may need a Td booster every 10 years. Varicella (chickenpox) vaccine  You may need this vaccine if you have not already been vaccinated. Zoster (shingles) vaccine  You may need this after age 50. Pneumococcal conjugate (PCV13) vaccine  One dose is recommended after age 24. Pneumococcal polysaccharide (PPSV23) vaccine  One dose is recommended after age 33. Measles, mumps, and rubella (MMR) vaccine  You may need at least one dose of MMR if you were born in 1957 or later. You may also need a second dose. Meningococcal conjugate (MenACWY) vaccine  You may need this if you have certain conditions. Hepatitis A vaccine  You may need this if you have certain conditions or if you travel or work in places where you may be exposed to hepatitis A. Hepatitis B vaccine   You may need this if you have certain conditions or if you travel or work in places where you may be exposed to hepatitis B. Haemophilus influenzae type b (Hib) vaccine  You may need this if you have certain conditions. You may receive vaccines as individual doses or as more than one vaccine together in one shot (combination vaccines). Talk with your health care provider about the risks and benefits of combination vaccines. What tests do I need? Blood tests  Lipid and cholesterol levels. These may be checked every 5 years, or more frequently depending on your overall health.  Hepatitis C test.  Hepatitis B test. Screening  Lung cancer screening. You may have this screening every year starting at age 74 if you have a 30-pack-year history of smoking and currently smoke or have quit within the past 15 years.  Colorectal cancer screening. All adults should have this screening starting at age 57 and continuing until age 54. Your health care provider may recommend screening at age 47 if you are at increased risk. You will have tests every 1-10 years, depending on your results and the type of screening test.  Prostate cancer screening. Recommendations will vary depending on your family history and other risks.  Diabetes screening. This is done by checking your blood sugar (glucose) after you have not eaten for a while (fasting). You may have this done every 1-3 years.  Abdominal aortic aneurysm (AAA) screening. You may need this if you are a current or former smoker.  Sexually transmitted disease (STD) testing. Follow these instructions at home: Eating and drinking  Eat  a diet that includes fresh fruits and vegetables, whole grains, lean protein, and low-fat dairy products. Limit your intake of foods with high amounts of sugar, saturated fats, and salt.  Take vitamin and mineral supplements as recommended by your health care provider.  Do not drink alcohol if your health care provider  tells you not to drink.  If you drink alcohol: ? Limit how much you have to 0-2 drinks a day. ? Be aware of how much alcohol is in your drink. In the [place] one drink equals one 12 oz bottle of beer (355 mL), one 5 oz glass of wine (148 mL), or one 1 oz glass of hard liquor (44 mL). Lifestyle  Take daily care of your teeth and gums.  Stay active. Exercise for at least 30 minutes on 5 or more days each week.  Do not use any products that contain nicotine or tobacco, such as cigarettes, e-cigarettes, and chewing tobacco. If you need help quitting, ask your health care provider.  If you are sexually active, practice safe sex. Use a condom or other form of protection to prevent STIs (sexually transmitted infections).  Talk with your health care provider about taking a low-dose aspirin or statin. What's next?  Visit your health care provider once a year for a well check visit.  Ask your health care provider how often you should have your eyes and teeth checked.  Stay up to date on all vaccines. This information is not intended to replace advice given to you by your health care provider. Make sure you discuss any questions you have with your health care provider. Document Released: 04/08/2015 Document Revised: 03/06/2018 Document Reviewed: 03/06/2018 Elsevier Patient Education  2020 [employer]  

## 2019-02-05 NOTE — Progress Notes (Addendum)
MEDICARE ANNUAL WELLNESS VISIT  02/05/2019  Telephone Visit Disclaimer This Medicare AWV was conducted by telephone due to national recommendations for restrictions regarding the COVID-19 Pandemic (e.g. social distancing).  I verified, using two identifiers, that I am speaking with Peter York or their authorized healthcare agent. I discussed the limitations, risks, security, and privacy concerns of performing an evaluation and management service by telephone and the potential availability of an in-person appointment in the future. The patient expressed understanding and agreed to proceed.   Subjective:  Peter York is a 73 y.o. male patient of Peter York, Peter York who had a Medicare Annual Wellness Visit today via telephone. 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 he has 3 children. he reports that he is socially active and does interact with friends/family regularly. he is markedly physically active and enjoys tending/training/riding his horses, training the bird dogs, and staying active.  Patient Care Team: Peter York as PCP - General (Family Medicine) Peter Mayer, MD as Consulting Physician (Gastroenterology)  Advanced Directives 02/05/2019 07/01/2018 06/29/2018 07/22/2017 07/19/2016 02/11/2016 12/20/2014  Does Patient Have a Medical Advance Directive? No No No No No No No  Type of Advance Directive - - - - - - -  Would patient like information on creating a medical advance directive? No - Patient declined No - Patient declined - No - Patient declined Yes (MAU/Ambulatory/Procedural Areas - Information given) No - patient declined information No - patient declined information  Pre-existing out of facility DNR order (yellow form or pink MOST form) - - - - - - -    Hospital Utilization Over the Past 12 Months: # of hospitalizations or ER visits: 1 # of surgeries: 0  Review of Systems    Patient reports  that his overall health is unchanged compared to last year.  History obtained from chart review  Patient Reported Readings (BP, Pulse, CBG, Weight, etc) none  Pain Assessment Pain : No/denies pain     Current Medications & Allergies (verified) Allergies as of 02/05/2019      Reactions   Crestor [rosuvastatin Calcium] Other (See Comments)   "like to killed me." joint pain, unable to get OOB   Benazepril Other (See Comments)   hyperkalemia      Medication List       Accurate as of February 05, 2019  4:02 PM. If you have any questions, ask your nurse or doctor.        acetaminophen 650 MG CR tablet Commonly known as: TYLENOL Take 650 mg by mouth daily as needed for pain.   amLODipine 10 MG tablet Commonly known as: NORVASC Take 1 tablet (10 mg total) by mouth daily.   atorvastatin 10 MG tablet Commonly known as: LIPITOR Take 1 tablet (10 mg total) by mouth daily.   CALCIUM-D PO Take 1 tablet by mouth at bedtime.   colchicine 0.6 MG tablet Take 0.6 mg by mouth daily.   cycloSPORINE 0.05 % ophthalmic emulsion Commonly known as: RESTASIS York 1 drop into both eyes 2 (two) times daily as needed (dry eyes).   ezetimibe 10 MG tablet Commonly known as: ZETIA Take 1 tablet (10 mg total) by mouth daily.   glimepiride 4 MG tablet Commonly known as: AMARYL Take 1 tablet (4 mg total) by mouth daily with breakfast.   glucose blood test strip USE TO CHECK BLOOD SUGAR ONCE A DAY AS INSTRUCTED   hydrALAZINE  10 MG tablet Commonly known as: APRESOLINE Take 1 tablet (10 mg total) by mouth 3 (three) times daily.   losartan 25 MG tablet Commonly known as: COZAAR Take 1 tablet (25 mg total) by mouth daily.   Ozempic (0.25 or 0.5 MG/DOSE) 2 MG/1.5ML Sopn Generic drug: Semaglutide(0.25 or 0.5MG /DOS) Inject 1 mg into the skin once a week.   PROSTATE PO Take 1 tablet by mouth 2 (two) times a day. Super Beta Prostate   vitamin E 400 UNIT capsule Take 1 capsule (400  Units total) by mouth daily.       History (reviewed): Past Medical History:  Diagnosis Date  . Adenomatous colon polyp 12/31/2005  . Diabetes mellitus   . Hypercholesterolemia   . Hypertension   . Intracranial hemorrhage ([place]) 2020   secondary to hypertensive crisis  . Renal artery stenosis (HCC)    Left renal artery 1-59% by dopplers 10/2018   Past Surgical History:  Procedure Laterality Date  . ANAL FISTULECTOMY  10/02/2011   Procedure: FISTULECTOMY ANAL;  Surgeon: Peter Faster. Cornett, MD;  Location: [facility];  Service: General;  Laterality: N/A;  excision perianal mass   . APPENDECTOMY    . COLONOSCOPY W/ POLYPECTOMY  12/31/2005   Dr. Silvano York  . UMBILICAL HERNIA REPAIR     Family History  Problem Relation Age of Onset  . Colon cancer Father 31  . Colon polyps Father   . Cancer Father 46       Colon  . Heart attack Maternal Uncle        X 4  . Heart disease Maternal Uncle   . Heart disease Maternal Uncle   . Heart disease Maternal Uncle   . Diabetes Daughter 13       Type 1  . Transient ischemic attack Brother 53   Social History   Socioeconomic History  . Marital status: Married    Spouse name: Peter York  . Number of children: 3  . Years of education: 62  . Highest education level: Some college, no degree  Occupational History  . Occupation: retired  Scientific laboratory technician  . Financial resource strain: Not hard at all  . Food insecurity    Worry: Never true    Inability: Never true  . Transportation needs    Medical: No    Non-medical: No  Tobacco Use  . Smoking status: Former Smoker    Packs/day: 0.75    Years: 15.00    Pack years: 11.25    Types: Cigarettes    Quit date: 03/26/1978    Years since quitting: 40.8  . Smokeless tobacco: Never Used  Substance and Sexual Activity  . Alcohol use: Yes    Comment: cocktail once a month  . Drug use: No  . Sexual activity: Yes  Lifestyle  . Physical activity    Days per week: 7 days    Minutes  per session: 90 min  . Stress: Not at all  Relationships  . Social connections    Talks on phone: More than three times a week    Gets together: More than three times a week    Attends religious service: Never    Active member of club or organization: No    Attends meetings of clubs or organizations: Never    Relationship status: Married  Other Topics Concern  . Not on file  Social History Narrative  . Not on file    Activities of Daily Living In your present state  of health, do you have any difficulty performing the following activities: 02/05/2019 07/01/2018  Hearing? Y N  Comment due to working around loud equipment for 30 years-ringing in both [place]? N N  Comment wears glasses all the time-gets yearly eye exam -  Difficulty concentrating or making decisions? Y N  Comment remembering names is sometimes difficult but pt states he has always had this problem -  Walking or climbing stairs? N Y  Dressing or bathing? N Y  Doing errands, shopping? N Y  Conservation officer, nature and eating ? N -  Using the Toilet? N -  In the past six months, have you accidently leaked urine? N -  Do you have problems with loss of bowel control? N -  Managing your Medications? N -  Managing your Finances? N -  Housekeeping or managing your Housekeeping? N -  Some recent data might be hidden    Patient Education/ Literacy How often do you need to have someone help you when you read instructions, pamphlets, or other written materials from your doctor or pharmacy?: 1 - Never What is the last grade level you completed in school?: 2 years of college  Exercise Current Exercise Habits: Home exercise routine, Type of exercise: walking, Time (Minutes): 60, Frequency (Times/Week): 7, Weekly Exercise (Minutes/Week): 420, Intensity: Moderate, Exercise limited by: None identified  Diet Patient reports consuming 3 meals a day and 1 snack(s) a day Patient reports that his primary diet is: Regular Patient  reports that she does have regular access to food.   Depression Screen PHQ 2/9 Scores 02/05/2019 02/03/2019 08/28/2018 08/14/2018 07/18/2018 03/17/2018 12/12/2017  PHQ - 2 Score 0 0 0 0 0 0 0     Fall Risk Fall Risk  02/05/2019 02/03/2019 08/14/2018 07/18/2018 03/17/2018  Falls in the past year? 1 0 0 0 0  Number falls in past yr: 0 - - - -  Injury with Fall? 0 - - - -  Risk for fall due to : - - - Medication side effect -  Follow up Falls prevention discussed - - - -  Comment Get rid of all throw rugs in the house, adequate lighting in the walkways and grab bars in the bathroom - - - -     Objective:  Peter York seemed alert and oriented and he participated appropriately during our telephone visit.  Blood Pressure Weight BMI  BP Readings from Last 3 Encounters:  02/03/19 137/86  12/03/18 138/70  11/27/18 130/76   Wt Readings from Last 3 Encounters:  02/03/19 160 lb 6.4 oz (72.8 kg)  12/03/18 161 lb (73 kg)  08/25/18 157 lb (71.2 kg)   BMI Readings from Last 1 Encounters:  02/03/19 24.39 kg/m    *Unable to obtain current vital signs, weight, and BMI due to telephone visit type  Hearing/Vision  . Peter York did not seem to have difficulty with hearing/understanding during the telephone conversation . Reports that he has had a formal eye exam by an eye care professional within the past year . Reports that he has not had a formal hearing evaluation within the past year *Unable to fully assess hearing and vision during telephone visit type  Cognitive Function: 6CIT Screen 02/05/2019  What Year? 0 points  What month? 0 points  What time? 0 points  Count back from 20 0 points  Months in reverse 0 points  Repeat phrase 0 points  Total Score 0   (Normal:0-7, Significant for Dysfunction: >8)  Normal  Cognitive Function Screening: Yes   Immunization & Health Maintenance Record Immunization History  Administered Date(s) Administered  . Pneumococcal Conjugate-13 04/14/2013  .  Pneumococcal Polysaccharide-23 09/16/2014  . Td 01/29/2015    Health Maintenance  Topic Date Due  . COLONOSCOPY  12/19/2017  . OPHTHALMOLOGY EXAM  12/07/2018  . HEMOGLOBIN A1C  05/06/2019  . FOOT EXAM  02/03/2020  . TETANUS/TDAP  01/28/2025  . Hepatitis C Screening  Completed  . PNA vac Low Risk Adult  Completed  . INFLUENZA VACCINE  Discontinued       Assessment  This is a routine wellness examination for Peter York.  Health Maintenance: Due or Overdue Health Maintenance Due  Topic Date Due  . COLONOSCOPY  12/19/2017  . OPHTHALMOLOGY EXAM  12/07/2018    Peter York does not need a referral for Community Assistance: Care Management:   no Social Work:    no Prescription Assistance:  no Nutrition/Diabetes Education:  no   Plan:  Personalized Goals Goals Addressed            This Visit's Progress   . DIET - INCREASE WATER INTAKE       Try to drink 6-8 glasses of water daily      Personalized Health Maintenance & Screening Recommendations  Influenza vaccine Colorectal cancer screening Shingles vaccine  Lung Cancer Screening Recommended: no (Low Dose CT Chest recommended if Age 7-80 years, 30 pack-year currently smoking OR have quit w/in past 15 years) Hepatitis C Screening recommended: no HIV Screening recommended: no  Advanced Directives: Written information was not prepared per patient's request.  Referrals & Orders No orders of the defined types were placed in this encounter.   Follow-up Plan . Follow-up with Peter York as planned . Schedule your Colonoscopy as discussed  . Declined Flu and Shingles vaccines    I have personally reviewed and noted the following in the patient's chart:   . Medical and social history . Use of alcohol, tobacco or illicit drugs  . Current medications and supplements . Functional ability and status . Nutritional status . Physical activity . Advanced directives . List of other physicians .  Hospitalizations, surgeries, and ER visits in previous 12 months . Vitals . Screenings to include cognitive, depression, and falls . Referrals and appointments  In addition, I have reviewed and discussed with Peter York certain preventive protocols, quality metrics, and best practice recommendations. A written personalized care plan for preventive services as well as general preventive health recommendations is available and can be mailed to the patient at his request.      Sylvia Helms, Donny Pique, LPN    I have reviewed and agree with the above AWV documentation.   Mary-Margaret Hassell Done, York

## 2019-02-18 ENCOUNTER — Telehealth: Payer: Self-pay | Admitting: Pharmacist

## 2019-02-18 DIAGNOSIS — I10 Essential (primary) hypertension: Secondary | ICD-10-CM

## 2019-02-18 NOTE — Telephone Encounter (Signed)
Pt called clinic reporting elevated BP with systolic readings in the 0000000. He states he increased his losartan from 25mg  to 50mg  daily 3 weeks ago since his BP was up. States it's running about 10 points higher and states this typically happens in the wintertime. States he is more active now than in the summertime. Reports drinking slightly more caffeine (1 cup of coffee per day). He is still taking amlodipine 10mg  daily and hydralazine 10mg  TID. PCP checked BMET on 11/10, pt states he was already taking losartan 50mg  at that time.  Will increase hydralazine to 20mg  TID (pt just picked up 90 day supply of the 10mg  dose). Advised pt to continue to monitor his BP at home - goal is < 130/80. Will call pt in 2-3 weeks to see how his readings look. He verbalized agreement and understanding of plan.

## 2019-02-23 ENCOUNTER — Telehealth: Payer: Self-pay | Admitting: Nurse Practitioner

## 2019-02-23 NOTE — Chronic Care Management (AMB) (Signed)
Chronic Care Management   Note  02/23/2019 Name: LINVILLE DECAROLIS MRN: [id number] DOB: Mar 24, 1946  JARVIN OGREN is a 73 y.o. year old male who is a primary care patient of Chevis Pretty, FNP. I reached out to Lindell Noe by phone today in response to a referral sent by Mr. Marquese Burkland [facility] health plan.     Mr. Gary was given information about Chronic Care Management services today including:  1. CCM service includes personalized support from designated clinical staff supervised by his physician, including individualized plan of care and coordination with other care providers 2. 24/7 contact phone numbers for assistance for urgent and routine care needs. 3. Service will only be billed when office clinical staff spend 20 minutes or more in a month to coordinate care. 4. Only one practitioner may furnish and bill the service in a calendar month. 5. The patient may stop CCM services at any time (effective at the end of the month) by phone call to the office staff. 6. The patient will be responsible for cost sharing (co-pay) of up to 20% of the service fee (after annual deductible is met).  Patient did not agree to enrollment in care management services and does not wish to consider at this time.  Follow up plan: The patient has been provided with contact information for the chronic care management team and has been advised to call with any health related questions or concerns.   [place], [place] [address] Direct Dial: [email or web address]  Website: [email or web address]

## 2019-03-05 ENCOUNTER — Telehealth: Payer: Self-pay | Admitting: Pharmacist

## 2019-03-05 DIAGNOSIS — I10 Essential (primary) hypertension: Secondary | ICD-10-CM

## 2019-03-05 NOTE — Telephone Encounter (Signed)
Called pt to follow up with BP readings since increasing hydralazine to 20mg  TID on 11/25.  Pt reports BPs 140s/80 before taking meds, but then improves to < [id number] systolic after taking his meds. Denies dizziness. States he only takes 20mg  of hydralazine in the morning when he wakes up since his readings are higher then. He has continued taking 10mg  for his lunch and evening doses of hydralazine.  Discussed that increasing his evening dose would help to bring down his morning BP a bit better - he is agreeable to increasing his dose to 20mg  TID across the board. He will keep an eye on his readings. Will call pt again in a few week to see how his readings look.

## 2019-03-09 ENCOUNTER — Other Ambulatory Visit: Payer: PPO

## 2019-03-17 ENCOUNTER — Other Ambulatory Visit: Payer: Self-pay | Admitting: Nurse Practitioner

## 2019-03-17 DIAGNOSIS — I10 Essential (primary) hypertension: Secondary | ICD-10-CM

## 2019-03-18 ENCOUNTER — Telehealth: Payer: Self-pay | Admitting: Pharmacist

## 2019-03-18 DIAGNOSIS — I10 Essential (primary) hypertension: Secondary | ICD-10-CM

## 2019-03-18 MED ORDER — HYDRALAZINE HCL 25 MG PO TABS: 25.0000 mg | ORAL_TABLET | Freq: Three times a day (TID) | ORAL | 3 refills | Status: DC

## 2019-03-18 NOTE — Telephone Encounter (Signed)
Called pt for update with BP readings.  149/79 this AM, 139/70s yesterday. Feeling well overall, denies dizziness, blurred vision, headaches or falls.  Pt has been taking hydralazine 20mg  TID and needs a refill. Will send in for hydralazine 25mg  TID and advised pt to take 1 tab TID. He will keep an eye on his BP readings at home - he has not been sitting for 5-10 minutes before checking his readings which may be elevating them slightly. Will call pt in a few weeks for an update.

## 2019-04-02 ENCOUNTER — Telehealth: Payer: Self-pay | Admitting: Pharmacist

## 2019-04-02 NOTE — Telephone Encounter (Signed)
Called pt for BP update. He states he is feeling well, BP is staying right around [id number] systolic. Lowest 120, highest 130s. Advised him to continue current BP meds including hydralazine 25mg  TID, losartan 25mg  daily, and amlodipine 10mg  daily. He verbalized understanding and will call with any issues.

## 2019-05-10 ENCOUNTER — Other Ambulatory Visit: Payer: Self-pay | Admitting: Nurse Practitioner

## 2019-05-10 DIAGNOSIS — E119 Type 2 diabetes mellitus without complications: Secondary | ICD-10-CM

## 2019-05-11 ENCOUNTER — Other Ambulatory Visit: Payer: Self-pay

## 2019-05-12 ENCOUNTER — Other Ambulatory Visit: Payer: Self-pay | Admitting: Nurse Practitioner

## 2019-05-12 ENCOUNTER — Ambulatory Visit (INDEPENDENT_AMBULATORY_CARE_PROVIDER_SITE_OTHER): Payer: PPO | Admitting: Nurse Practitioner

## 2019-05-12 ENCOUNTER — Ambulatory Visit (INDEPENDENT_AMBULATORY_CARE_PROVIDER_SITE_OTHER): Payer: PPO

## 2019-05-12 ENCOUNTER — Encounter: Payer: Self-pay | Admitting: Nurse Practitioner

## 2019-05-12 VITALS — BP 130/78 | HR 74 | Temp 98.2°F | Resp 20 | Ht 68.0 in | Wt 159.0 lb

## 2019-05-12 DIAGNOSIS — E782 Mixed hyperlipidemia: Secondary | ICD-10-CM | POA: Diagnosis not present

## 2019-05-12 DIAGNOSIS — M810 Age-related osteoporosis without current pathological fracture: Secondary | ICD-10-CM

## 2019-05-12 DIAGNOSIS — I6930 Unspecified sequelae of cerebral infarction: Secondary | ICD-10-CM

## 2019-05-12 DIAGNOSIS — J309 Allergic rhinitis, unspecified: Secondary | ICD-10-CM | POA: Diagnosis not present

## 2019-05-12 DIAGNOSIS — M8589 Other specified disorders of bone density and structure, multiple sites: Secondary | ICD-10-CM | POA: Diagnosis not present

## 2019-05-12 DIAGNOSIS — E119 Type 2 diabetes mellitus without complications: Secondary | ICD-10-CM | POA: Diagnosis not present

## 2019-05-12 DIAGNOSIS — I10 Essential (primary) hypertension: Secondary | ICD-10-CM

## 2019-05-12 DIAGNOSIS — Z125 Encounter for screening for malignant neoplasm of prostate: Secondary | ICD-10-CM

## 2019-05-12 LAB — BAYER DCA HB A1C WAIVED: HB A1C (BAYER DCA - WAIVED): 6.4 % (ref <7.0)

## 2019-05-12 MED ORDER — EZETIMIBE 10 MG PO TABS: 10.0000 mg | ORAL_TABLET | Freq: Every day | ORAL | 1 refills | Status: DC

## 2019-05-12 MED ORDER — HYDRALAZINE HCL 25 MG PO TABS: 25.0000 mg | ORAL_TABLET | Freq: Three times a day (TID) | ORAL | 1 refills | Status: DC

## 2019-05-12 MED ORDER — FLUTICASONE PROPIONATE 50 MCG/ACT NA SUSP: 2.0000 | Freq: Every day | NASAL | 6 refills | Status: DC

## 2019-05-12 MED ORDER — AMLODIPINE BESYLATE 10 MG PO TABS: 10.0000 mg | ORAL_TABLET | Freq: Every day | ORAL | 1 refills | Status: DC

## 2019-05-12 MED ORDER — OZEMPIC (0.25 OR 0.5 MG/DOSE) 2 MG/1.5ML ~~LOC~~ SOPN: 0.5000 mg | PEN_INJECTOR | SUBCUTANEOUS | 5 refills | Status: DC

## 2019-05-12 MED ORDER — OZEMPIC (0.25 OR 0.5 MG/DOSE) 2 MG/1.5ML ~~LOC~~ SOPN: 1.0000 mg | PEN_INJECTOR | SUBCUTANEOUS | 5 refills | Status: DC

## 2019-05-12 MED ORDER — LOSARTAN POTASSIUM 25 MG PO TABS: 25.0000 mg | ORAL_TABLET | Freq: Every day | ORAL | 1 refills | Status: DC

## 2019-05-12 MED ORDER — OZEMPIC (0.25 OR 0.5 MG/DOSE) 2 MG/1.5ML ~~LOC~~ SOPN: 0.5000 mL | PEN_INJECTOR | SUBCUTANEOUS | 5 refills | Status: DC

## 2019-05-12 MED ORDER — GLIMEPIRIDE 4 MG PO TABS: 4.0000 mg | ORAL_TABLET | Freq: Every day | ORAL | 1 refills | Status: DC

## 2019-05-12 MED ORDER — ATORVASTATIN CALCIUM 10 MG PO TABS: 10.0000 mg | ORAL_TABLET | Freq: Every day | ORAL | 1 refills | Status: DC

## 2019-05-12 NOTE — Addendum Note (Signed)
Addended by: Chevis Pretty on: 05/12/2019 08:47 AM   Modules accepted: Orders

## 2019-05-12 NOTE — Patient Instructions (Signed)

## 2019-05-12 NOTE — Progress Notes (Signed)
Subjective:    Patient ID: Peter York, male    DOB: February 07, 1946, 74 y.o.   MRN: [id number]   Chief Complaint: Medical Management of Chronic Issues    HPI:  1. Type 2 diabetes mellitus without complication, without long-term current use of insulin (HCC) Fasting blood sugars are running around 100-150. Denies any low blood sugars. Lab Results  Component Value Date   HGBA1C 5.9 02/03/2019    2. Essential hypertension No c/o chest pain, sob or headache. Does not check blood pressure at home. BP Readings from Last 3 Encounters:  02/03/19 137/86  12/03/18 138/70  11/27/18 130/76     3. 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 Lab Results  Component Value Date   CHOL 113 02/03/2019   HDL 36 (L) 02/03/2019   LDLCALC 62 02/03/2019   TRIG 74 02/03/2019   CHOLHDL 3.1 02/03/2019     4. Screening for prostate cancer PSA has been elevate din the past. Was referred to [facility] urology upon elevation. He has a follow up appointment coming up. He has been seeing them every 6 months. Lab Results  Component Value Date   PSA1 7.9 (H) 08/14/2016   PSA1 2.7 09/14/2014   PSA 2.31 10/02/2012      5. Late effect of cerebrovascular accident (CVA) He has no residual effects from stoke. Says that he is doing well.  6. Osteoporosis of vertebra Has had compression fractures in back. He rides horses a lot and they think that contributed to his compression fractures. His last dexascan was done in 2017 with tscore of -2.3. he does do a lot of walking with his hunting dogs. He denies any back pain. He has been on fosamax in the past but stopped taking it on his own.Will repeat dexascan today.     Outpatient Encounter Medications as of 05/12/2019  Medication Sig  . acetaminophen (TYLENOL) 650 MG CR tablet Take 650 mg by mouth daily as needed for pain.  [name] amLODipine (NORVASC) 10 MG tablet Take 1 tablet (10 mg total) by mouth daily.  [name]  atorvastatin (LIPITOR) 10 MG tablet Take 1 tablet (10 mg total) by mouth daily.  . Calcium Carbonate-Vitamin D (CALCIUM-D PO) Take 1 tablet by mouth at bedtime.  . colchicine 0.6 MG tablet Take 0.6 mg by mouth daily.  . cycloSPORINE (RESTASIS) 0.05 % ophthalmic emulsion Place 1 drop into both eyes 2 (two) times daily as needed (dry eyes).  . ezetimibe (ZETIA) 10 MG tablet Take 1 tablet (10 mg total) by mouth daily.  [name] glimepiride (AMARYL) 4 MG tablet Take 1 tablet (4 mg total) by mouth daily with breakfast.  . glucose blood test strip USE TO CHECK BLOOD SUGAR ONCE A DAY AS INSTRUCTED  . hydrALAZINE (APRESOLINE) 25 MG tablet Take 1 tablet (25 mg total) by mouth 3 (three) times daily.  [name] losartan (COZAAR) 25 MG tablet Take 1 tablet (25 mg total) by mouth daily.  . Semaglutide,0.25 or 0.5MG/DOS, (OZEMPIC, 0.25 OR 0.5 MG/DOSE,) 2 MG/1.5ML SOPN Inject 1 mg into the skin once a week.  [name] Specialty Vitamins Products (PROSTATE PO) Take 1 tablet by mouth 2 (two) times a day. Super Beta Prostate  . vitamin E 400 UNIT capsule Take 1 capsule (400 Units total) by mouth daily.    Past Surgical History:  Procedure Laterality Date  . ANAL FISTULECTOMY  10/02/2011   Procedure: FISTULECTOMY ANAL;  Surgeon: Joyice Faster. Cornett, MD;  Location: [facility];  Service: General;  Laterality: N/A;  excision perianal mass   . APPENDECTOMY    . COLONOSCOPY W/ POLYPECTOMY  12/31/2005   Dr. Silvano Rusk  . UMBILICAL HERNIA REPAIR      Family History  Problem Relation Age of Onset  . Colon cancer Father 18  . Colon polyps Father   . Cancer Father 48       Colon  . Heart attack Maternal Uncle        X 4  . Heart disease Maternal Uncle   . Heart disease Maternal Uncle   . Heart disease Maternal Uncle   . Diabetes Daughter 13       Type 1  . Transient ischemic attack Brother 19    New complaints: Constant sinus drainage. Takes nothing for it.  Social history: Lives with wife who owns a flower  shop in the area.  Controlled substance contract: n/a    Review of Systems  Constitutional: Negative for diaphoresis.  Eyes: Negative for pain.  Respiratory: Negative for shortness of breath.   Cardiovascular: Negative for chest pain, palpitations and leg swelling.  Gastrointestinal: Negative for abdominal pain.  Endocrine: Negative for polydipsia.  Skin: Negative for rash.  Neurological: Negative for dizziness, weakness and headaches.  Hematological: Does not bruise/bleed easily.  All other systems reviewed and are negative.      Objective:   Physical Exam Vitals and nursing note reviewed.  Constitutional:      Appearance: Normal appearance. He is well-developed.  HENT:     Head: Normocephalic.     Nose: Nose normal.  Eyes:     Pupils: Pupils are equal, round, and reactive to light.  Neck:     Thyroid: No thyroid mass or thyromegaly.     Vascular: No carotid bruit or JVD.     Trachea: Phonation normal.  Cardiovascular:     Rate and Rhythm: Normal rate and regular rhythm.  Pulmonary:     Effort: Pulmonary effort is normal. No respiratory distress.     Breath sounds: Normal breath sounds.  Abdominal:     General: Bowel sounds are normal.     Palpations: Abdomen is soft.     Tenderness: There is no abdominal tenderness.  Musculoskeletal:        General: Normal range of motion.     Cervical back: Normal range of motion and neck supple.  Lymphadenopathy:     Cervical: No cervical adenopathy.  Skin:    General: Skin is warm and dry.  Neurological:     Mental Status: He is alert and oriented to person, place, and time.  Psychiatric:        Behavior: Behavior normal.        Thought Content: Thought content normal.        Judgment: Judgment normal.     BP 130/78   Pulse 74   Temp 98.2 F (36.8 C) (Temporal)   Resp 20   Ht _0  (1.727 m)   Wt 159 lb (72.1 kg)   SpO2 100%   BMI 24.18 kg/m   HGBA1c 6.4       Assessment & Plan:  Peter York comes in  today with chief complaint of Medical Management of Chronic Issues   Diagnosis and orders addressed:  1. Type 2 diabetes mellitus without complication, without long-term current use of insulin (HCC) Low carb diet - Bayer DCA Hb A1c Waived - glimepiride (AMARYL) 4 MG tablet; Take 1 tablet (  4 mg total) by mouth daily with breakfast.  Dispense: 90 tablet; Refill: 1 - Semaglutide,0.25 or 0.5MG/DOS, (OZEMPIC, 0.25 OR 0.5 MG/DOSE,) 2 MG/1.5ML SOPN; Inject 1 mg into the skin once a week.  Dispense: 4 pen; Refill: 5  2. Essential hypertension Low sodium diet - CBC with Differential/Platelet - CMP14+EGFR - hydrALAZINE (APRESOLINE) 25 MG tablet; Take 1 tablet (25 mg total) by mouth 3 (three) times daily.  Dispense: 270 tablet; Refill: 1 - losartan (COZAAR) 25 MG tablet; Take 1 tablet (25 mg total) by mouth daily.  Dispense: 90 tablet; Refill: 1 - amLODipine (NORVASC) 10 MG tablet; Take 1 tablet (10 mg total) by mouth daily.  Dispense: 90 tablet; Refill: 1  3. Mixed hyperlipidemia Low fat diet - Lipid panel - ezetimibe (ZETIA) 10 MG tablet; Take 1 tablet (10 mg total) by mouth daily.  Dispense: 90 tablet; Refill: 1 - atorvastatin (LIPITOR) 10 MG tablet; Take 1 tablet (10 mg total) by mouth daily.  Dispense: 90 tablet; Refill: 1  4. Screening for prostate cancer - PSA, total and free  5. Late effect of cerebrovascular accident (CVA)  6. Osteoporosis of vertebra Weight bearing exercise  7. Allergic rhinitis, unspecified seasonality, unspecified trigger - fluticasone (FLONASE) 50 MCG/ACT nasal spray; Place 2 sprays into both nostrils daily.  Dispense: 16 g; Refill: 6   Labs pending Health Maintenance reviewed Diet and exercise encouraged  Follow up plan: 3 months   Mary-Margaret Hassell Done, FNP

## 2019-05-13 LAB — CMP14+EGFR
ALT: 47 IU/L — ABNORMAL HIGH (ref 0–44)
AST: 21 IU/L (ref 0–40)
Albumin/Globulin Ratio: 1.5 (ref 1.2–2.2)
Albumin: 4.1 g/dL (ref 3.7–4.7)
Alkaline Phosphatase: 76 IU/L (ref 39–117)
BUN/Creatinine Ratio: 14 (ref 10–24)
BUN: 25 mg/dL (ref 8–27)
Bilirubin Total: 0.4 mg/dL (ref 0.0–1.2)
CO2: 22 mmol/L (ref 20–29)
Calcium: 9.3 mg/dL (ref 8.6–10.2)
Chloride: 105 mmol/L (ref 96–106)
Creatinine, Ser: 1.75 mg/dL — ABNORMAL HIGH (ref 0.76–1.27)
GFR calc Af Amer: 44 mL/min/{1.73_m2} — ABNORMAL LOW (ref >59)
GFR calc non Af Amer: 38 mL/min/{1.73_m2} — ABNORMAL LOW (ref >59)
Globulin, Total: 2.8 g/dL (ref 1.5–4.5)
Glucose: 132 mg/dL — ABNORMAL HIGH (ref 65–99)
Potassium: 5.5 mmol/L — ABNORMAL HIGH (ref 3.5–5.2)
Sodium: 141 mmol/L (ref 134–144)
Total Protein: 6.9 g/dL (ref 6.0–8.5)

## 2019-05-13 LAB — PSA, TOTAL AND FREE
PSA, Free Pct: 29.9 %
PSA, Free: 2.33 ng/mL
Prostate Specific Ag, Serum: 7.8 ng/mL — ABNORMAL HIGH (ref 0.0–4.0)

## 2019-05-13 LAB — CBC WITH DIFFERENTIAL/PLATELET
Basophils Absolute: 0.1 10*3/uL (ref 0.0–0.2)
Basos: 0 %
EOS (ABSOLUTE): 0.2 10*3/uL (ref 0.0–0.4)
Eos: 2 %
Hematocrit: 35 % — ABNORMAL LOW (ref 37.5–51.0)
Hemoglobin: 11.7 g/dL — ABNORMAL LOW (ref 13.0–17.7)
Immature Grans (Abs): 0 10*3/uL (ref 0.0–0.1)
Immature Granulocytes: 0 %
Lymphocytes Absolute: 1 10*3/uL (ref 0.7–3.1)
Lymphs: 9 %
MCH: 31 pg (ref 26.6–33.0)
MCHC: 33.4 g/dL (ref 31.5–35.7)
MCV: 93 fL (ref 79–97)
Monocytes Absolute: 0.6 10*3/uL (ref 0.1–0.9)
Monocytes: 5 %
Neutrophils Absolute: 9.5 10*3/uL — ABNORMAL HIGH (ref 1.4–7.0)
Neutrophils: 84 %
Platelets: 337 10*3/uL (ref 150–450)
RBC: 3.77 x10E6/uL — ABNORMAL LOW (ref 4.14–5.80)
RDW: 12.7 % (ref 11.6–15.4)
WBC: 11.5 10*3/uL — ABNORMAL HIGH (ref 3.4–10.8)

## 2019-05-13 LAB — LIPID PANEL
Chol/HDL Ratio: 4.6 ratio (ref 0.0–5.0)
Cholesterol, Total: 142 mg/dL (ref 100–199)
HDL: 31 mg/dL — ABNORMAL LOW (ref >39)
LDL Chol Calc (NIH): 94 mg/dL (ref 0–99)
Triglycerides: 87 mg/dL (ref 0–149)
VLDL Cholesterol Cal: 17 mg/dL (ref 5–40)

## 2019-07-08 DIAGNOSIS — R972 Elevated prostate specific antigen [PSA]: Secondary | ICD-10-CM | POA: Diagnosis not present

## 2019-07-15 DIAGNOSIS — R972 Elevated prostate specific antigen [PSA]: Secondary | ICD-10-CM | POA: Diagnosis not present

## 2019-07-15 DIAGNOSIS — R8279 Other abnormal findings on microbiological examination of urine: Secondary | ICD-10-CM | POA: Diagnosis not present

## 2019-07-15 DIAGNOSIS — N400 Enlarged prostate without lower urinary tract symptoms: Secondary | ICD-10-CM | POA: Diagnosis not present

## 2019-08-05 ENCOUNTER — Other Ambulatory Visit: Payer: Self-pay | Admitting: Neurosurgery

## 2019-08-06 ENCOUNTER — Other Ambulatory Visit: Payer: Self-pay | Admitting: Neurosurgery

## 2019-08-06 DIAGNOSIS — I671 Cerebral aneurysm, nonruptured: Secondary | ICD-10-CM

## 2019-08-19 ENCOUNTER — Ambulatory Visit
Admission: RE | Admit: 2019-08-19 | Discharge: 2019-08-19 | Disposition: A | Discharge status: Home / Self Care | Payer: PPO | Source: Ambulatory Visit | Attending: Neurosurgery | Admitting: Neurosurgery

## 2019-08-19 ENCOUNTER — Other Ambulatory Visit: Payer: Self-pay | Admitting: Neurosurgery

## 2019-08-19 DIAGNOSIS — I671 Cerebral aneurysm, nonruptured: Secondary | ICD-10-CM

## 2019-08-20 ENCOUNTER — Other Ambulatory Visit: Payer: Self-pay

## 2019-08-20 ENCOUNTER — Ambulatory Visit (INDEPENDENT_AMBULATORY_CARE_PROVIDER_SITE_OTHER): Payer: PPO | Admitting: Nurse Practitioner

## 2019-08-20 ENCOUNTER — Encounter: Payer: Self-pay | Admitting: Nurse Practitioner

## 2019-08-20 VITALS — BP 126/77 | HR 64 | Temp 97.4°F | Resp 20 | Ht 68.0 in | Wt 157.0 lb

## 2019-08-20 DIAGNOSIS — M810 Age-related osteoporosis without current pathological fracture: Secondary | ICD-10-CM

## 2019-08-20 DIAGNOSIS — E782 Mixed hyperlipidemia: Secondary | ICD-10-CM | POA: Diagnosis not present

## 2019-08-20 DIAGNOSIS — E119 Type 2 diabetes mellitus without complications: Secondary | ICD-10-CM | POA: Diagnosis not present

## 2019-08-20 DIAGNOSIS — I701 Atherosclerosis of renal artery: Secondary | ICD-10-CM | POA: Diagnosis not present

## 2019-08-20 DIAGNOSIS — I6930 Unspecified sequelae of cerebral infarction: Secondary | ICD-10-CM

## 2019-08-20 DIAGNOSIS — I10 Essential (primary) hypertension: Secondary | ICD-10-CM

## 2019-08-20 LAB — BAYER DCA HB A1C WAIVED: HB A1C (BAYER DCA - WAIVED): 6 % (ref <7.0)

## 2019-08-20 NOTE — Progress Notes (Signed)
Subjective:    Patient ID: Peter York, male    DOB: 1945-05-24, 74 y.o.   MRN: [id number]   Chief Complaint: Medical Management of Chronic Issues    HPI:  1. Benign essential HTN No c/o chest pain, ob or headache. Does not check blood pressure at home. BP Readings from Last 3 Encounters:  05/12/19 130/78  02/03/19 137/86  12/03/18 138/70    2. Renal artery stenosis (HCC) This has caused problem keeping blood pressure under control. But he says they told him he did not have renal artery stenosis. I do not see result of scan in chart.  3. Type 2 diabetes mellitus without complication, without long-term current use of insulin (HCC) Fasting blood sugars are running around 150. He stopped taking glimipiride because blood sugars were dropping to low. He is still on his ozempic. Lab Results  Component Value Date   HGBA1C 6.4 05/12/2019     4. Mixed hyperlipidemia Does try to watch diet. Does stay very active and walks a lot with his hunting dog and horses. Lab Results  Component Value Date   CHOL 142 05/12/2019   HDL 31 (L) 05/12/2019   LDLCALC 94 05/12/2019   TRIG 87 05/12/2019   CHOLHDL 4.6 05/12/2019     5. Late effect of cerebrovascular accident (CVA) Has no residual effect  6. Osteoporosis of vertebra Last dexacan was done on 05/12/19. His tscore was -1.8. he has gotten fractiure after riding horse for long hours    Outpatient Encounter Medications as of 08/20/2019  Medication Sig  . acetaminophen (TYLENOL) 650 MG CR tablet Take 650 mg by mouth daily as needed for pain.  [name] amLODipine (NORVASC) 10 MG tablet Take 1 tablet (10 mg total) by mouth daily.  [name] atorvastatin (LIPITOR) 10 MG tablet Take 1 tablet (10 mg total) by mouth daily.  . Calcium Carbonate-Vitamin D (CALCIUM-D PO) Take 1 tablet by mouth at bedtime.  . colchicine 0.6 MG tablet Take 0.6 mg by mouth daily.  . cycloSPORINE (RESTASIS) 0.05 % ophthalmic emulsion Place 1 drop into both eyes 2 (two) times  daily as needed (dry eyes).  . ezetimibe (ZETIA) 10 MG tablet Take 1 tablet (10 mg total) by mouth daily.  . fluticasone (FLONASE) 50 MCG/ACT nasal spray Place 2 sprays into both nostrils daily.  [name] glimepiride (AMARYL) 4 MG tablet Take 1 tablet (4 mg total) by mouth daily with breakfast.  . glucose blood test strip USE TO CHECK BLOOD SUGAR ONCE A DAY AS INSTRUCTED  . hydrALAZINE (APRESOLINE) 25 MG tablet Take 1 tablet (25 mg total) by mouth 3 (three) times daily.  [name] losartan (COZAAR) 25 MG tablet Take 1 tablet (25 mg total) by mouth daily.  . Semaglutide,0.25 or 0.5MG/DOS, (OZEMPIC, 0.25 OR 0.5 MG/DOSE,) 2 MG/1.5ML SOPN Inject 0.5 mg into the skin once a week.  [name] Specialty Vitamins Products (PROSTATE PO) Take 1 tablet by mouth 2 (two) times a day. Super Beta Prostate  . vitamin E 400 UNIT capsule Take 1 capsule (400 Units total) by mouth daily.    Past Surgical History:  Procedure Laterality Date  . ANAL FISTULECTOMY  10/02/2011   Procedure: FISTULECTOMY ANAL;  Surgeon: Joyice Faster. Cornett, MD;  Location: [facility];  Service: General;  Laterality: N/A;  excision perianal mass   . APPENDECTOMY    . COLONOSCOPY W/ POLYPECTOMY  12/31/2005   Dr. Silvano Rusk  . UMBILICAL HERNIA REPAIR      Family History  Problem  Relation Age of Onset  . Colon cancer Father 50  . Colon polyps Father   . Cancer Father 67       Colon  . Heart attack Maternal Uncle        X 4  . Heart disease Maternal Uncle   . Heart disease Maternal Uncle   . Heart disease Maternal Uncle   . Diabetes Daughter 13       Type 1  . Transient ischemic attack Brother 5    New complaints: None today  Social history: Lives with wife. 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  Controlled substance contract: n/a    Review of Systems  Constitutional: Negative for diaphoresis.  Eyes: Negative for pain.  Respiratory: Negative for shortness of breath.   Cardiovascular:  Negative for chest pain, palpitations and leg swelling.  Gastrointestinal: Negative for abdominal pain.  Endocrine: Negative for polydipsia.  Skin: Negative for rash.  Neurological: Negative for dizziness, weakness and headaches.  Hematological: Does not bruise/bleed easily.  All other systems reviewed and are negative.      Objective:   Physical Exam Vitals and nursing note reviewed.  Constitutional:      Appearance: Normal appearance. He is well-developed.  HENT:     Head: Normocephalic.     Nose: Nose normal.  Eyes:     Pupils: Pupils are equal, round, and reactive to light.  Neck:     Thyroid: No thyroid mass or thyromegaly.     Vascular: No carotid bruit or JVD.     Trachea: Phonation normal.  Cardiovascular:     Rate and Rhythm: Normal rate and regular rhythm.  Pulmonary:     Effort: Pulmonary effort is normal. No respiratory distress.     Breath sounds: Normal breath sounds.  Abdominal:     General: Bowel sounds are normal.     Palpations: Abdomen is soft.     Tenderness: There is no abdominal tenderness.  Musculoskeletal:        General: Normal range of motion.     Cervical back: Normal range of motion and neck supple.  Lymphadenopathy:     Cervical: No cervical adenopathy.  Skin:    General: Skin is warm and dry.  Neurological:     Mental Status: He is alert and oriented to person, place, and time.  Psychiatric:        Behavior: Behavior normal.        Thought Content: Thought content normal.        Judgment: Judgment normal.    BP 126/77   Pulse 64   Temp (!) 97.4 F (36.3 C) (Temporal)   Resp 20   Ht _0  (1.727 m)   Wt 157 lb (71.2 kg)   SpO2 98%   BMI 23.87 kg/m   hgba1c 6.0%      Assessment & Plan:  Peter York comes in today with chief complaint of Medical Management of Chronic Issues   Diagnosis and orders addressed:  1. Benign essential HTN Low sodium diet - CBC with Differential/Platelet - CMP14+EGFR  2. Renal artery  stenosis ([place]) Will try to find report verifying diagnosi  3. Type 2 diabetes mellitus without complication, without long-term current use of insulin (HCC) Continue to watch carbs in diet Ok to hold glimipiride but continue ozempic - Bayer DCA Hb A1c Waived  4. Mixed hyperlipidemia Low fat diet - Lipid panel  5. Late effect of cerebrovascular accident (CVA) Fall  precautions  6. Osteoporosis of vertebra Weight bearing exercise   Labs pending Health Maintenance reviewed Diet and exercise encouraged  Follow up plan: 3 months   Mary-Margaret Hassell Done, FNP

## 2019-08-20 NOTE — Patient Instructions (Signed)

## 2019-08-21 LAB — CMP14+EGFR
ALT: 47 IU/L — ABNORMAL HIGH (ref 0–44)
AST: 34 IU/L (ref 0–40)
Albumin/Globulin Ratio: 1.7 (ref 1.2–2.2)
Albumin: 4.6 g/dL (ref 3.7–4.7)
Alkaline Phosphatase: 71 IU/L (ref 48–121)
BUN/Creatinine Ratio: 16 (ref 10–24)
BUN: 28 mg/dL — ABNORMAL HIGH (ref 8–27)
Bilirubin Total: 0.4 mg/dL (ref 0.0–1.2)
CO2: 21 mmol/L (ref 20–29)
Calcium: 9.2 mg/dL (ref 8.6–10.2)
Chloride: 105 mmol/L (ref 96–106)
Creatinine, Ser: 1.7 mg/dL — ABNORMAL HIGH (ref 0.76–1.27)
GFR calc Af Amer: 45 mL/min/{1.73_m2} — ABNORMAL LOW (ref >59)
GFR calc non Af Amer: 39 mL/min/{1.73_m2} — ABNORMAL LOW (ref >59)
Globulin, Total: 2.7 g/dL (ref 1.5–4.5)
Glucose: 124 mg/dL — ABNORMAL HIGH (ref 65–99)
Potassium: 5.2 mmol/L (ref 3.5–5.2)
Sodium: 139 mmol/L (ref 134–144)
Total Protein: 7.3 g/dL (ref 6.0–8.5)

## 2019-08-21 LAB — LIPID PANEL
Chol/HDL Ratio: 3.3 ratio (ref 0.0–5.0)
Cholesterol, Total: 107 mg/dL (ref 100–199)
HDL: 32 mg/dL — ABNORMAL LOW (ref >39)
LDL Chol Calc (NIH): 60 mg/dL (ref 0–99)
Triglycerides: 71 mg/dL (ref 0–149)
VLDL Cholesterol Cal: 15 mg/dL (ref 5–40)

## 2019-08-21 LAB — CBC WITH DIFFERENTIAL/PLATELET
Basophils Absolute: 0.1 10*3/uL (ref 0.0–0.2)
Basos: 1 %
EOS (ABSOLUTE): 0.4 10*3/uL (ref 0.0–0.4)
Eos: 8 %
Hematocrit: 35 % — ABNORMAL LOW (ref 37.5–51.0)
Hemoglobin: 12.1 g/dL — ABNORMAL LOW (ref 13.0–17.7)
Immature Grans (Abs): 0 10*3/uL (ref 0.0–0.1)
Immature Granulocytes: 0 %
Lymphocytes Absolute: 1.1 10*3/uL (ref 0.7–3.1)
Lymphs: 21 %
MCH: 31.3 pg (ref 26.6–33.0)
MCHC: 34.6 g/dL (ref 31.5–35.7)
MCV: 91 fL (ref 79–97)
Monocytes Absolute: 0.4 10*3/uL (ref 0.1–0.9)
Monocytes: 7 %
Neutrophils Absolute: 3.3 10*3/uL (ref 1.4–7.0)
Neutrophils: 63 %
Platelets: 251 10*3/uL (ref 150–450)
RBC: 3.86 x10E6/uL — ABNORMAL LOW (ref 4.14–5.80)
RDW: 13 % (ref 11.6–15.4)
WBC: 5.3 10*3/uL (ref 3.4–10.8)

## 2019-08-24 ENCOUNTER — Other Ambulatory Visit: Payer: Self-pay | Admitting: Nurse Practitioner

## 2019-08-25 ENCOUNTER — Ambulatory Visit: Payer: PPO | Admitting: Cardiology

## 2019-08-25 ENCOUNTER — Encounter: Payer: Self-pay | Admitting: Cardiology

## 2019-08-25 ENCOUNTER — Other Ambulatory Visit: Payer: Self-pay

## 2019-08-25 VITALS — BP 132/74 | HR 67 | Ht 68.0 in | Wt 160.6 lb

## 2019-08-25 DIAGNOSIS — E782 Mixed hyperlipidemia: Secondary | ICD-10-CM

## 2019-08-25 DIAGNOSIS — N1831 Chronic kidney disease, stage 3a: Secondary | ICD-10-CM

## 2019-08-25 DIAGNOSIS — E119 Type 2 diabetes mellitus without complications: Secondary | ICD-10-CM | POA: Diagnosis not present

## 2019-08-25 DIAGNOSIS — I10 Essential (primary) hypertension: Secondary | ICD-10-CM

## 2019-08-25 NOTE — Patient Instructions (Signed)

## 2019-08-25 NOTE — Progress Notes (Signed)
Cardiology Office Note:    Date:  08/25/2019   ID:  Peter York, DOB 01-Dec-1945, MRN [id number]  PCP:  Chevis Pretty, FNP  Cardiologist:  Fransico Him, MD    Referring MD: Peter York, Mary-Margaret, *   No chief complaint on file.   History of Present Illness:    Peter York is a 74 y.o. male with a hx of HTN, DM and dyslipidemia.  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 His ASA was stopped and he has essentially regained all his deficits after rehab. He is here today for followup and is doing well.  he denies any chest pain or pressure, SOB, DOE, PND, orthopnea, LE edema, dizziness, palpitations or syncope. He is compliant with his meds and is tolerating meds with no SE.    Past Medical History:  Diagnosis Date  . Adenomatous colon polyp 12/31/2005  . Diabetes mellitus   . Hypercholesterolemia   . Hypertension   . 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 Renal artery stenosis (HCC)    Left renal artery 1-59% by dopplers 10/2018    Past Surgical History:  Procedure Laterality Date  . ANAL FISTULECTOMY  10/02/2011   Procedure: FISTULECTOMY ANAL;  Surgeon: Joyice Faster. Cornett, MD;  Location: [facility];  Service: General;  Laterality: N/A;  excision perianal mass   . APPENDECTOMY    . COLONOSCOPY W/ POLYPECTOMY  12/31/2005   Dr. Silvano Rusk  . UMBILICAL HERNIA REPAIR      Current Medications: Current Meds  Medication Sig  . acetaminophen (TYLENOL) 650 MG CR tablet Take 650 mg by mouth daily as needed for pain.  [name] amLODipine (NORVASC) 10 MG tablet Take 1 tablet (10 mg total) by mouth daily.  [name] atorvastatin (LIPITOR) 10 MG tablet Take 1 tablet (10 mg total) by mouth daily.  . Calcium Carbonate-Vitamin D (CALCIUM-D PO) Take 1 tablet by mouth at bedtime.  . colchicine 0.6 MG tablet Take 0.6 mg by mouth daily.  . cycloSPORINE (RESTASIS) 0.05 % ophthalmic emulsion Place 1 drop into both eyes 2 (two) times daily as  needed (dry eyes).  . ezetimibe (ZETIA) 10 MG tablet Take 1 tablet (10 mg total) by mouth daily.  . fluticasone (FLONASE) 50 MCG/ACT nasal spray Place 2 sprays into both nostrils daily.  [name] glucose blood test strip USE TO CHECK BLOOD SUGAR ONCE A DAY AS INSTRUCTED  . hydrALAZINE (APRESOLINE) 25 MG tablet Take 1 tablet (25 mg total) by mouth 3 (three) times daily.  [name] losartan (COZAAR) 25 MG tablet Take 1 tablet (25 mg total) by mouth daily.  . Semaglutide,0.25 or 0.5MG /DOS, (OZEMPIC, 0.25 OR 0.5 MG/DOSE,) 2 MG/1.5ML SOPN Inject 0.5 mg into the skin once a week.  [name] Specialty Vitamins Products (PROSTATE PO) Take 1 tablet by mouth 2 (two) times a day. Super Beta Prostate  . vitamin E 400 UNIT capsule Take 1 capsule (400 Units total) by mouth daily.     Allergies:   Crestor [rosuvastatin calcium] and Benazepril   Social History   Socioeconomic History  . Marital status: Married    Spouse name: Peter York  . Number of children: 3  . Years of education: 30  . Highest education level: Some college, no degree  Occupational History  . Occupation: retired  Tobacco Use  . Smoking status: Former Smoker    Packs/day: 0.75    Years: 15.00    Pack years: 11.25    Types: Cigarettes  Quit date: 03/26/1978    Years since quitting: 41.4  . Smokeless tobacco: Never Used  Substance and Sexual Activity  . Alcohol use: Yes    Comment: cocktail once a month  . Drug use: No  . Sexual activity: Yes  Other Topics Concern  . Not on file  Social History Narrative  . Not on file   Social Determinants of Health   Financial Resource Strain:   . Difficulty of Paying Living Expenses:   Food Insecurity:   . Worried About Charity fundraiser in the Last Year:   . Arboriculturist in the Last Year:   Transportation Needs:   . Film/video editor (Medical):   [name] Lack of Transportation (Non-Medical):   Physical Activity:   . Days of Exercise per Week:   . Minutes of Exercise per Session:   Stress:   .  Feeling of Stress :   Social Connections:   . Frequency of Communication with Friends and Family:   . Frequency of Social Gatherings with Friends and Family:   . Attends Religious Services:   . Active Member of Clubs or Organizations:   . Attends Archivist Meetings:   [name] Marital Status:      Family History: The patient's family history includes Cancer (age of onset: 73) in his father; Colon cancer (age of onset: 31) in his father; Colon polyps in his father; Diabetes (age of onset: 74) in his daughter; Heart attack in his maternal uncle; Heart disease in his maternal uncle, maternal uncle, and maternal uncle; Transient ischemic attack (age of onset: 57) in his brother.  ROS:   Please see the history of present illness.    ROS  All other systems reviewed and negative.   EKGs/Labs/Other Studies Reviewed:    The following studies were reviewed today: EKG  EKG:  EKG is  ordered today.  The ekg ordered today demonstrates NSR with nonspecific T wave abnormality  Recent Labs: 08/20/2019: ALT 47; BUN 28; Creatinine, Ser 1.70; Hemoglobin 12.1; Platelets 251; Potassium 5.2; Sodium 139   Recent Lipid Panel    Component Value Date/Time   CHOL 107 08/20/2019 0804   CHOL 165 10/02/2012 0834   TRIG 71 08/20/2019 0804   TRIG 115 03/09/2014 0818   TRIG 95 10/02/2012 0834   HDL 32 (L) 08/20/2019 0804   HDL 43 03/09/2014 0818   HDL 51 10/02/2012 0834   CHOLHDL 3.3 08/20/2019 0804   CHOLHDL 5.3 06/29/2018 1401   VLDL 35 06/29/2018 1401   LDLCALC 60 08/20/2019 0804   LDLCALC 223 (H) 09/10/2013 0905   LDLCALC 95 10/02/2012 0834    Physical Exam:    VS:  BP 132/74   Pulse 67   Ht 5\' 8"  (1.727 m)   Wt 160 lb 9.6 oz (72.8 kg)   BMI 24.42 kg/m     Wt Readings from Last 3 Encounters:  08/25/19 160 lb 9.6 oz (72.8 kg)  08/20/19 157 lb (71.2 kg)  05/12/19 159 lb (72.1 kg)     GEN:  Well nourished, well developed in no acute distress HEENT: Normal NECK: No JVD; No carotid  bruits LYMPHATICS: No lymphadenopathy CARDIAC: RRR, no murmurs, rubs, gallops RESPIRATORY:  Clear to auscultation without rales, wheezing or rhonchi  ABDOMEN: Soft, non-tender, non-distended MUSCULOSKELETAL:  No edema; No deformity  SKIN: Warm and dry NEUROLOGIC:  Alert and oriented x 3 PSYCHIATRIC:  Normal affect   ASSESSMENT:    1. Benign essential HTN  2. Type 2 diabetes mellitus without complication, without long-term current use of insulin ([place])   3. Mixed hyperlipidemia   4. Stage 3a chronic kidney disease    PLAN:    In order of problems listed above:  1.  Hypertension  -BP controlled on exam today -continue amlodipine 10mg  daily, Hydralazine 25mg  TID and Losartan 25mg  daily  2.  Type 2 DM  - this if followed by his PCP.  -His last HbA1C was 6.4%.   -He will continue on Semglutide   3.  Hyperlipidemia  - his LDL goal is < 70.   -His LDL was 60 ain may -continue atorvastatin 10mg  daily and Zetia 10mg  daily  4.  CKD stage 3 - this is followed by his PCP.     Medication Adjustments/Labs and Tests Ordered: Current medicines are reviewed at length with the patient today.  Concerns regarding medicines are outlined above.  Orders Placed This Encounter  Procedures  . EKG 12-Lead   No orders of the defined types were placed in this encounter.   Signed, Fransico Him, MD  08/25/2019 8:32 AM    [facility]

## 2019-09-08 DIAGNOSIS — I10 Essential (primary) hypertension: Secondary | ICD-10-CM | POA: Diagnosis not present

## 2019-09-08 DIAGNOSIS — I671 Cerebral aneurysm, nonruptured: Secondary | ICD-10-CM | POA: Insufficient documentation

## 2019-11-02 ENCOUNTER — Other Ambulatory Visit: Payer: Self-pay | Admitting: Nurse Practitioner

## 2019-11-02 DIAGNOSIS — E119 Type 2 diabetes mellitus without complications: Secondary | ICD-10-CM

## 2019-11-02 DIAGNOSIS — J309 Allergic rhinitis, unspecified: Secondary | ICD-10-CM

## 2019-11-03 DIAGNOSIS — Z20828 Contact with and (suspected) exposure to other viral communicable diseases: Secondary | ICD-10-CM | POA: Diagnosis not present

## 2019-12-03 ENCOUNTER — Other Ambulatory Visit: Payer: Self-pay

## 2019-12-03 ENCOUNTER — Ambulatory Visit (INDEPENDENT_AMBULATORY_CARE_PROVIDER_SITE_OTHER): Payer: PPO | Admitting: Nurse Practitioner

## 2019-12-03 ENCOUNTER — Encounter: Payer: Self-pay | Admitting: Nurse Practitioner

## 2019-12-03 VITALS — BP 138/80 | HR 59 | Temp 97.6°F | Resp 20 | Ht 68.0 in | Wt 156.0 lb

## 2019-12-03 DIAGNOSIS — I10 Essential (primary) hypertension: Secondary | ICD-10-CM

## 2019-12-03 DIAGNOSIS — M810 Age-related osteoporosis without current pathological fracture: Secondary | ICD-10-CM

## 2019-12-03 DIAGNOSIS — E119 Type 2 diabetes mellitus without complications: Secondary | ICD-10-CM

## 2019-12-03 DIAGNOSIS — E782 Mixed hyperlipidemia: Secondary | ICD-10-CM | POA: Diagnosis not present

## 2019-12-03 DIAGNOSIS — I6930 Unspecified sequelae of cerebral infarction: Secondary | ICD-10-CM | POA: Diagnosis not present

## 2019-12-03 DIAGNOSIS — S22060D Wedge compression fracture of T7-T8 vertebra, subsequent encounter for fracture with routine healing: Secondary | ICD-10-CM

## 2019-12-03 LAB — BAYER DCA HB A1C WAIVED: HB A1C (BAYER DCA - WAIVED): 6.2 % (ref <7.0)

## 2019-12-03 MED ORDER — LOSARTAN POTASSIUM 25 MG PO TABS: 25.0000 mg | ORAL_TABLET | Freq: Every day | ORAL | 1 refills | Status: DC

## 2019-12-03 MED ORDER — AMLODIPINE BESYLATE 10 MG PO TABS: 10.0000 mg | ORAL_TABLET | Freq: Every day | ORAL | 1 refills | Status: DC

## 2019-12-03 MED ORDER — ATORVASTATIN CALCIUM 10 MG PO TABS: 10.0000 mg | ORAL_TABLET | Freq: Every day | ORAL | 1 refills | Status: DC

## 2019-12-03 MED ORDER — EZETIMIBE 10 MG PO TABS: 10.0000 mg | ORAL_TABLET | Freq: Every day | ORAL | 1 refills | Status: DC

## 2019-12-03 NOTE — Patient Instructions (Signed)
Diabetes Mellitus and Foot Care Foot care is an important part of your health, especially when you have diabetes. Diabetes may cause you to have problems because of poor blood flow (circulation) to your feet and legs, which can cause your skin to:  Become thinner and drier.  Break more easily.  Heal more slowly.  Peel and crack. You may also have nerve damage (neuropathy) in your legs and feet, causing decreased feeling in them. This means that you may not notice minor injuries to your feet that could lead to more serious problems. Noticing and addressing any potential problems early is the best way to prevent future foot problems. How to care for your feet Foot hygiene  Wash your feet daily with warm water and mild soap. Do not use hot water. Then, pat your feet and the areas between your toes until they are completely dry. Do not soak your feet as this can dry your skin.  Trim your toenails straight across. Do not dig under them or around the cuticle. File the edges of your nails with an emery board or nail file.  Apply a moisturizing lotion or petroleum jelly to the skin on your feet and to dry, brittle toenails. Use lotion that does not contain alcohol and is unscented. Do not apply lotion between your toes. Shoes and socks  Wear clean socks or stockings every day. Make sure they are not too tight. Do not wear knee-high stockings since they may decrease blood flow to your legs.  Wear shoes that fit properly and have enough cushioning. Always look in your shoes before you put them on to be sure there are no objects inside.  To break in new shoes, wear them for just a few hours a day. This prevents injuries on your feet. Wounds, scrapes, corns, and calluses  Check your feet daily for blisters, cuts, bruises, sores, and redness. If you cannot see the bottom of your feet, use a mirror or ask someone for help.  Do not cut corns or calluses or try to remove them with medicine.  If you  find a minor scrape, cut, or break in the skin on your feet, keep it and the skin around it clean and dry. You may clean these areas with mild soap and water. Do not clean the area with peroxide, alcohol, or iodine.  If you have a wound, scrape, corn, or callus on your foot, look at it several times a day to make sure it is healing and not infected. Check for: ? Redness, swelling, or pain. ? Fluid or blood. ? Warmth. ? Pus or a bad smell. General instructions  Do not cross your legs. This may decrease blood flow to your feet.  Do not use heating pads or hot water bottles on your feet. They may burn your skin. If you have lost feeling in your feet or legs, you may not know this is happening until it is too late.  Protect your feet from hot and cold by wearing shoes, such as at the beach or on hot pavement.  Schedule a complete foot exam at least once a year (annually) or more often if you have foot problems. If you have foot problems, report any cuts, sores, or bruises to your health care provider immediately. Contact a health care provider if:  You have a medical condition that increases your risk of infection and you have any cuts, sores, or bruises on your feet.  You have an injury that is not   healing.  You have redness on your legs or feet.  You feel burning or tingling in your legs or feet.  You have pain or cramps in your legs and feet.  Your legs or feet are numb.  Your feet always feel cold.  You have pain around a toenail. Get help right away if:  You have a wound, scrape, corn, or callus on your foot and: ? You have pain, swelling, or redness that gets worse. ? You have fluid or blood coming from the wound, scrape, corn, or callus. ? Your wound, scrape, corn, or callus feels warm to the touch. ? You have pus or a bad smell coming from the wound, scrape, corn, or callus. ? You have a fever. ? You have a red line going up your leg. Summary  Check your feet every day  for cuts, sores, red spots, swelling, and blisters.  Moisturize feet and legs daily.  Wear shoes that fit properly and have enough cushioning.  If you have foot problems, report any cuts, sores, or bruises to your health care provider immediately.  Schedule a complete foot exam at least once a year (annually) or more often if you have foot problems. This information is not intended to replace advice given to you by your health care provider. Make sure you discuss any questions you have with your health care provider. Document Revised: 12/03/2018 Document Reviewed: 04/13/2016 Elsevier Patient Education  2020 [employer]  

## 2019-12-03 NOTE — Progress Notes (Signed)
Subjective:    Patient ID: Peter York, male    DOB: 24-Feb-1946, 74 y.o.   MRN: [id number]   Chief Complaint: medical management of chronic issues     HPI:  1. Benign essential HTN No c/o chest pain, sob or headache. Does check blood pressure at home and has been normal. BP Readings from Last 3 Encounters:  12/03/19 138/80  08/25/19 132/74  08/20/19 126/77     2. Mixed hyperlipidemia Does watch diet and stays very active.  Lab Results  Component Value Date   CHOL 107 08/20/2019   HDL 32 (L) 08/20/2019   LDLCALC 60 08/20/2019   TRIG 71 08/20/2019   CHOLHDL 3.3 08/20/2019     3. Type 2 diabetes mellitus without complication, without long-term current use of insulin (HCC) Fasting blood sugars running around 140. He denies any low blood sugars. He does not take his glimiperide every day. Takes about every 3rd day. He has only been taking 0.25 of ozempic instead of 0.5. he cut it back due to the expense. Lab Results  Component Value Date   HGBA1C 6.0 08/20/2019      4. Late effect of cerebrovascular accident (CVA) Has no residual effects  5. Compression fracture of T7 vertebra with routine healing, subsequent encounter Having no back pain currently  6. Osteoporosis of vertebra he is only doing calcium with vitamind and weight bearing exercise.    Outpatient Encounter Medications as of 12/03/2019  Medication Sig  . acetaminophen (TYLENOL) 650 MG CR tablet Take 650 mg by mouth daily as needed for pain.  [name] amLODipine (NORVASC) 10 MG tablet Take 1 tablet (10 mg total) by mouth daily.  [name] atorvastatin (LIPITOR) 10 MG tablet Take 1 tablet (10 mg total) by mouth daily.  . Calcium Carbonate-Vitamin D (CALCIUM-D PO) Take 1 tablet by mouth at bedtime.  . colchicine 0.6 MG tablet Take 0.6 mg by mouth daily.  . cycloSPORINE (RESTASIS) 0.05 % ophthalmic emulsion Place 1 drop into both eyes 2 (two) times daily as needed (dry eyes).  . ezetimibe (ZETIA) 10 MG tablet Take 1  tablet (10 mg total) by mouth daily.  . fluticasone (FLONASE) 50 MCG/ACT nasal spray SPRAY 2 SPRAYS INTO EACH NOSTRIL EVERY DAY  . hydrALAZINE (APRESOLINE) 25 MG tablet Take 1 tablet (25 mg total) by mouth 3 (three) times daily.  [name] losartan (COZAAR) 25 MG tablet Take 1 tablet (25 mg total) by mouth daily.  [name] test strip USE TO CHECK BLOOD SUGAR ONCE A DAY AS INSTRUCTED  . Semaglutide,0.25 or 0.5MG/DOS, (OZEMPIC, 0.25 OR 0.5 MG/DOSE,) 2 MG/1.5ML SOPN Inject 0.5 mg into the skin once a week.  [name] Specialty Vitamins Products (PROSTATE PO) Take 1 tablet by mouth 2 (two) times a day. Super Beta Prostate  . vitamin E 400 UNIT capsule Take 1 capsule (400 Units total) by mouth daily.     Past Surgical History:  Procedure Laterality Date  . ANAL FISTULECTOMY  10/02/2011   Procedure: FISTULECTOMY ANAL;  Surgeon: Joyice Faster. Cornett, MD;  Location: [facility];  Service: General;  Laterality: N/A;  excision perianal mass   . APPENDECTOMY    . COLONOSCOPY W/ POLYPECTOMY  12/31/2005   Dr. Silvano Rusk  . UMBILICAL HERNIA REPAIR      Family History  Problem Relation Age of Onset  . Colon cancer Father 69  . Colon polyps Father   . Cancer Father 32       Colon  .  Heart attack Maternal Uncle        X 4  . Heart disease Maternal Uncle   . Heart disease Maternal Uncle   . Heart disease Maternal Uncle   . Diabetes Daughter 13       Type 1  . Transient ischemic attack Brother 23    New complaints: None today  Social history: Lives with his wife. He runs dogs and judges compititions  Controlled substance contract: n/a    Review of Systems  Constitutional: Negative for diaphoresis.  Eyes: Negative for pain.  Respiratory: Negative for shortness of breath.   Cardiovascular: Negative for chest pain, palpitations and leg swelling.  Gastrointestinal: Negative for abdominal pain.  Endocrine: Negative for polydipsia.  Skin: Negative for rash.  Neurological: Negative  for dizziness, weakness and headaches.  Hematological: Does not bruise/bleed easily.  All other systems reviewed and are negative.      Objective:   Physical Exam Vitals and nursing note reviewed.  Constitutional:      Appearance: Normal appearance. He is well-developed.  HENT:     Head: Normocephalic.     Nose: Nose normal.  Eyes:     Pupils: Pupils are equal, round, and reactive to light.  Neck:     Thyroid: No thyroid mass or thyromegaly.     Vascular: No carotid bruit or JVD.     Trachea: Phonation normal.  Cardiovascular:     Rate and Rhythm: Normal rate and regular rhythm.  Pulmonary:     Effort: Pulmonary effort is normal. No respiratory distress.     Breath sounds: Normal breath sounds.  Abdominal:     General: Bowel sounds are normal.     Palpations: Abdomen is soft.     Tenderness: There is no abdominal tenderness.  Musculoskeletal:        General: Normal range of motion.     Cervical back: Normal range of motion and neck supple.  Lymphadenopathy:     Cervical: No cervical adenopathy.  Skin:    General: Skin is warm and dry.  Neurological:     Mental Status: He is alert and oriented to person, place, and time.  Psychiatric:        Behavior: Behavior normal.        Thought Content: Thought content normal.        Judgment: Judgment normal.    BP 138/80   Pulse (!) 59   Temp 97.6 F (36.4 C) (Temporal)   Resp 20   Ht _0  (1.727 m)   Wt 156 lb (70.8 kg)   SpO2 99%   BMI 23.72 kg/m         Assessment & Plan:  Peter York comes in today with chief complaint of Medical Management of Chronic Issues   Diagnosis and orders addressed:  1. Benign essential HTN Low sodium diet - CBC with Differential/Platelet - CMP14+EGFR - losartan (COZAAR) 25 MG tablet; Take 1 tablet (25 mg total) by mouth daily.  Dispense: 90 tablet; Refill: 1 - amLODipine (NORVASC) 10 MG tablet; Take 1 tablet (10 mg total) by mouth daily.  Dispense: 90 tablet; Refill:  1   2. Mixed hyperlipidemia Low fat diet - Lipid panel - ezetimibe (ZETIA) 10 MG tablet; Take 1 tablet (10 mg total) by mouth daily.  Dispense: 90 tablet; Refill: 1 - atorvastatin (LIPITOR) 10 MG tablet; Take 1 tablet (10 mg total) by mouth daily.  Dispense: 90 tablet; Refill: 1  3. Type 2 diabetes mellitus without  complication, without long-term current use of insulin (HCC) Continue to watch carbs in diet - Bayer DCA Hb A1c Waived  4. Late effect of cerebrovascular accident (CVA)  5. Compression fracture of T7 vertebra with routine healing, subsequent encounter  6. Osteoporosis of vertebra Weight bearing exercies   Labs pending Health Maintenance reviewed Diet and exercise encouraged  Follow up plan: 3 months   [place], FNP

## 2019-12-04 LAB — CMP14+EGFR
ALT: 34 IU/L (ref 0–44)
AST: 25 IU/L (ref 0–40)
Albumin/Globulin Ratio: 1.5 (ref 1.2–2.2)
Albumin: 4.5 g/dL (ref 3.7–4.7)
Alkaline Phosphatase: 68 IU/L (ref 48–121)
BUN/Creatinine Ratio: 13 (ref 10–24)
BUN: 22 mg/dL (ref 8–27)
Bilirubin Total: 0.7 mg/dL (ref 0.0–1.2)
CO2: 22 mmol/L (ref 20–29)
Calcium: 9.4 mg/dL (ref 8.6–10.2)
Chloride: 104 mmol/L (ref 96–106)
Creatinine, Ser: 1.7 mg/dL — ABNORMAL HIGH (ref 0.76–1.27)
GFR calc Af Amer: 45 mL/min/{1.73_m2} — ABNORMAL LOW (ref >59)
GFR calc non Af Amer: 39 mL/min/{1.73_m2} — ABNORMAL LOW (ref >59)
Globulin, Total: 3 g/dL (ref 1.5–4.5)
Glucose: 131 mg/dL — ABNORMAL HIGH (ref 65–99)
Potassium: 4.9 mmol/L (ref 3.5–5.2)
Sodium: 138 mmol/L (ref 134–144)
Total Protein: 7.5 g/dL (ref 6.0–8.5)

## 2019-12-04 LAB — CBC WITH DIFFERENTIAL/PLATELET
Basophils Absolute: 0.1 10*3/uL (ref 0.0–0.2)
Basos: 1 %
EOS (ABSOLUTE): 0.4 10*3/uL (ref 0.0–0.4)
Eos: 6 %
Hematocrit: 36.6 % — ABNORMAL LOW (ref 37.5–51.0)
Hemoglobin: 12.6 g/dL — ABNORMAL LOW (ref 13.0–17.7)
Immature Grans (Abs): 0 10*3/uL (ref 0.0–0.1)
Immature Granulocytes: 0 %
Lymphocytes Absolute: 1.2 10*3/uL (ref 0.7–3.1)
Lymphs: 21 %
MCH: 31.5 pg (ref 26.6–33.0)
MCHC: 34.4 g/dL (ref 31.5–35.7)
MCV: 92 fL (ref 79–97)
Monocytes Absolute: 0.4 10*3/uL (ref 0.1–0.9)
Monocytes: 7 %
Neutrophils Absolute: 3.9 10*3/uL (ref 1.4–7.0)
Neutrophils: 65 %
Platelets: 251 10*3/uL (ref 150–450)
RBC: 4 x10E6/uL — ABNORMAL LOW (ref 4.14–5.80)
RDW: 12.5 % (ref 11.6–15.4)
WBC: 5.9 10*3/uL (ref 3.4–10.8)

## 2019-12-04 LAB — LIPID PANEL
Chol/HDL Ratio: 4.1 ratio (ref 0.0–5.0)
Cholesterol, Total: 145 mg/dL (ref 100–199)
HDL: 35 mg/dL — ABNORMAL LOW (ref >39)
LDL Chol Calc (NIH): 92 mg/dL (ref 0–99)
Triglycerides: 98 mg/dL (ref 0–149)
VLDL Cholesterol Cal: 18 mg/dL (ref 5–40)

## 2019-12-06 ENCOUNTER — Other Ambulatory Visit: Payer: Self-pay | Admitting: Nurse Practitioner

## 2019-12-06 DIAGNOSIS — I10 Essential (primary) hypertension: Secondary | ICD-10-CM

## 2020-01-11 DIAGNOSIS — R972 Elevated prostate specific antigen [PSA]: Secondary | ICD-10-CM | POA: Diagnosis not present

## 2020-02-08 ENCOUNTER — Ambulatory Visit (INDEPENDENT_AMBULATORY_CARE_PROVIDER_SITE_OTHER): Payer: PPO

## 2020-02-08 DIAGNOSIS — Z0000 Encounter for general adult medical examination without abnormal findings: Secondary | ICD-10-CM

## 2020-02-08 NOTE — Progress Notes (Signed)
MEDICARE ANNUAL WELLNESS VISIT  02/08/2020  Telephone Visit Disclaimer This Medicare AWV was conducted by telephone due to national recommendations for restrictions regarding the COVID-19 Pandemic (e.g. social distancing).  I verified, using two identifiers, that I am speaking with Lindell Noe or their authorized healthcare agent. I discussed the limitations, risks, security, and privacy concerns of performing an evaluation and management service by telephone and the potential availability of an in-person appointment in the future. The patient expressed understanding and agreed to proceed.  Location of Patient: Home Location of Provider (nurse):  [facility]  Subjective:    BARUCH LEWERS is a 74 y.o. male patient of Chevis Pretty, [place] who had a Medicare Annual Wellness Visit today via telephone. Daryus is Retired and lives with his wife and one daughter. He has three children, two daughters and one son.  He also has three granddaughters and two great grandchildren. He reports that he is socially active and does interact with friends/family regularly. He is moderately physically active and enjoys training dogs, running dogs in field trials, and taking care of his horses.  Patient Care Team: Chevis Pretty, FNP as PCP - General (Family Medicine) Sueanne Margarita, MD as PCP - Cardiology (Cardiology) Gatha Mayer, MD as Consulting Physician (Gastroenterology)  Advanced Directives 02/08/2020 02/05/2019 07/01/2018 06/29/2018 07/22/2017 07/19/2016 02/11/2016  Does Patient Have a Medical Advance Directive? No No No No No No No  Type of Advance Directive - - - - - - -  Would patient like information on creating a medical advance directive? No - Patient declined No - Patient declined No - Patient declined - No - Patient declined Yes (MAU/Ambulatory/Procedural Areas - Information given) No - patient declined information  Pre-existing out of facility DNR order (yellow form or pink MOST form)  - - - - - - -    Hospital Utilization Over the Past 12 Months: # of hospitalizations or ER visits: 0 # of surgeries: 0  Review of Systems    Patient reports that his overall health is better compared to last year.  History obtained from chart review and the patient  Patient Reported Readings (BP, Pulse, CBG, Weight, etc) none  Pain Assessment Pain : No/denies pain     Current Medications & Allergies (verified) Allergies as of 02/08/2020      Reactions   Crestor [rosuvastatin Calcium] Other (See Comments)   "like to killed me." joint pain, unable to get OOB   Benazepril Other (See Comments)   hyperkalemia      Medication List       Accurate as of February 08, 2020 10:22 AM. If you have any questions, ask your nurse or doctor.        acetaminophen 650 MG CR tablet Commonly known as: TYLENOL Take 650 mg by mouth daily as needed for pain.   amLODipine 10 MG tablet Commonly known as: NORVASC Take 1 tablet (10 mg total) by mouth daily.   atorvastatin 10 MG tablet Commonly known as: LIPITOR Take 1 tablet (10 mg total) by mouth daily.   CALCIUM-D PO Take 1 tablet by mouth at bedtime.   colchicine 0.6 MG tablet Take 0.6 mg by mouth daily as needed.   ezetimibe 10 MG tablet Commonly known as: ZETIA Take 1 tablet (10 mg total) by mouth daily.   fluticasone 50 MCG/ACT nasal spray Commonly known as: FLONASE SPRAY 2 SPRAYS INTO EACH NOSTRIL EVERY DAY   hydrALAZINE 25 MG tablet Commonly known as: APRESOLINE  TAKE 1 TABLET BY MOUTH THREE TIMES A DAY   losartan 25 MG tablet Commonly known as: COZAAR Take 1 tablet (25 mg total) by mouth daily.   OneTouch Ultra test strip Generic drug: glucose blood USE TO CHECK BLOOD SUGAR ONCE A DAY AS INSTRUCTED   Ozempic (0.25 or 0.5 MG/DOSE) 2 MG/1.5ML Sopn Generic drug: Semaglutide(0.25 or 0.5MG /DOS) Inject 0.5 mg into the skin once a week.   PROSTATE PO Take 1 tablet by mouth 2 (two) times a day. Super Beta Prostate    vitamin E 180 MG (400 UNITS) capsule Take 1 capsule (400 Units total) by mouth daily.       History (reviewed): Past Medical History:  Diagnosis Date  . Adenomatous colon polyp 12/31/2005  . Diabetes mellitus   . Hypercholesterolemia   . Hypertension   . Intracranial hemorrhage ([place]) 2020   secondary to hypertensive crisis  . Renal artery stenosis (HCC)    Left renal artery 1-59% by dopplers 10/2018   Past Surgical History:  Procedure Laterality Date  . ANAL FISTULECTOMY  10/02/2011   Procedure: FISTULECTOMY ANAL;  Surgeon: Joyice Faster. Cornett, MD;  Location: [facility];  Service: General;  Laterality: N/A;  excision perianal mass   . APPENDECTOMY    . COLONOSCOPY W/ POLYPECTOMY  12/31/2005   Dr. Silvano Rusk  . UMBILICAL HERNIA REPAIR     Family History  Problem Relation Age of Onset  . Colon cancer Father 58  . Colon polyps Father   . Cancer Father 22       Colon  . Heart attack Maternal Uncle        X 4  . Heart disease Maternal Uncle   . Heart disease Maternal Uncle   . Heart disease Maternal Uncle   . Diabetes Daughter 13       Type 1  . Transient ischemic attack Brother 24   Social History   Socioeconomic History  . Marital status: Married    Spouse name: [name]  . Number of children: 3  . Years of education: 22  . Highest education level: Some college, no degree  Occupational History  . Occupation: retired  Tobacco Use  . Smoking status: Former Smoker    Packs/day: 0.75    Years: 15.00    Pack years: 11.25    Types: Cigarettes    Quit date: 03/26/1978    Years since quitting: 41.9  . Smokeless tobacco: Never Used  Vaping Use  . Vaping Use: Never used  Substance and Sexual Activity  . Alcohol use: Yes    Comment: cocktail once a month  . Drug use: No  . Sexual activity: Yes  Other Topics Concern  . Not on file  Social History Narrative  . Not on file   Social Determinants of Health   Financial Resource Strain:   . Difficulty  of Paying Living Expenses: Not on file  Food Insecurity:   . Worried About Charity fundraiser in the Last Year: Not on file  . Ran Out of Food in the Last Year: Not on file  Transportation Needs:   . Lack of Transportation (Medical): Not on file  . Lack of Transportation (Non-Medical): Not on file  Physical Activity:   . Days of Exercise per Week: Not on file  . Minutes of Exercise per Session: Not on file  Stress:   . Feeling of Stress : Not on file  Social Connections:   . Frequency of Communication with  Friends and Family: Not on file  . Frequency of Social Gatherings with Friends and Family: Not on file  . Attends Religious Services: Not on file  . Active Member of Clubs or Organizations: Not on file  . Attends Archivist Meetings: Not on file  . Marital Status: Not on file    Activities of Daily Living In your present state of health, do you have any difficulty performing the following activities: 02/08/2020  Hearing? N  Vision? N  Difficulty concentrating or making decisions? N  Walking or climbing stairs? N  Dressing or bathing? N  Doing errands, shopping? N  Preparing Food and eating ? N  Using the Toilet? N  In the past six months, have you accidently leaked urine? N  Do you have problems with loss of bowel control? N  Managing your Medications? N  Managing your Finances? N  Housekeeping or managing your Housekeeping? N  Some recent data might be hidden    Patient Education/ Literacy How often do you need to have someone help you when you read instructions, pamphlets, or other written materials from your doctor or pharmacy?: 1 - Never What is the last grade level you completed in school?: 1 year of college  Exercise Current Exercise Habits: Home exercise routine, Type of exercise: walking, Time (Minutes): 30, Frequency (Times/Week): 7, Weekly Exercise (Minutes/Week): 210, Intensity: Mild  Diet Patient reports consuming 3 meals a day and 1 snack(s) a  day Patient reports that his primary diet is: Regular Patient reports that he does have regular access to food.   Depression Screen PHQ 2/9 Scores 02/08/2020 12/03/2019 08/20/2019 05/12/2019 02/05/2019 02/03/2019 08/28/2018  PHQ - 2 Score 0 0 0 0 0 0 0     Fall Risk Fall Risk  02/08/2020 12/03/2019 08/20/2019 05/12/2019 02/05/2019  Falls in the past year? 0 0 0 0 1  Number falls in past yr: - - - - 0  Injury with Fall? - - - - 0  Risk for fall due to : - - - - -  Follow up Falls evaluation completed - - - Falls prevention discussed  Comment - - - - Get rid of all throw rugs in the house, adequate lighting in the walkways and grab bars in the bathroom     Objective:  KANE KUSEK seemed alert and oriented and he participated appropriately during our telephone visit.  Blood Pressure Weight BMI  BP Readings from Last 3 Encounters:  12/03/19 138/80  08/25/19 132/74  08/20/19 126/77   Wt Readings from Last 3 Encounters:  12/03/19 156 lb (70.8 kg)  08/25/19 160 lb 9.6 oz (72.8 kg)  08/20/19 157 lb (71.2 kg)   BMI Readings from Last 1 Encounters:  12/03/19 23.72 kg/m    *Unable to obtain current vital signs, weight, and BMI due to telephone visit type  Hearing/Vision  . Kass did not seem to have difficulty with hearing/understanding during the telephone conversation . Reports that he has had a formal eye exam by an eye care professional within the past year . Reports that he has not had a formal hearing evaluation within the past year *Unable to fully assess hearing and vision during telephone visit type  Cognitive Function: 6CIT Screen 02/08/2020 02/05/2019  What Year? 0 points 0 points  What month? - 0 points  What time? 0 points 0 points  Count back from 20 0 points 0 points  Months in reverse - 0 points  Repeat phrase 0 points  0 points  Total Score - 0   (Normal:0-7, Significant for Dysfunction: >8)  Normal Cognitive Function Screening: Yes   Immunization & Health  Maintenance Record Immunization History  Administered Date(s) Administered  . Pneumococcal Conjugate-13 04/14/2013  . Pneumococcal Polysaccharide-23 09/16/2014  . Td 01/29/2015    Health Maintenance  Topic Date Due  . COVID-19 Vaccine (1) Never done  . OPHTHALMOLOGY EXAM  12/07/2018  . COLONOSCOPY  08/19/2020 (Originally 12/19/2017)  . HEMOGLOBIN A1C  03/03/2020  . FOOT EXAM  12/02/2020  . TETANUS/TDAP  01/28/2025  . Hepatitis C Screening  Completed  . PNA vac Low Risk Adult  Completed  . INFLUENZA VACCINE  Discontinued       Assessment  This is a routine wellness examination for NAPHTALI ZYWICKI.  Health Maintenance: Due or Overdue Health Maintenance Due  Topic Date Due  . COVID-19 Vaccine (1) Never done  . OPHTHALMOLOGY EXAM  12/07/2018    Lindell Noe does not need a referral for Community Assistance: Care Management:   no Social Work:    no Prescription Assistance:  no Nutrition/Diabetes Education:  no   Plan:  Personalized Goals Goals Addressed            This Visit's Progress   . Patient Stated       02/08/2020 AWV Goal: Fall Prevention  . Over the next year, patient will decrease their risk for falls by: o Using assistive devices, such as a cane or walker, as needed o Identifying fall risks within their home and correcting them by: - Removing throw rugs - Adding handrails to stairs or ramps - Removing clutter and keeping a clear pathway throughout the home - Increasing light, especially at night - Adding shower handles/bars - Raising toilet seat o Identifying potential personal risk factors for falls: - Medication side effects - Incontinence/urgency - Vestibular dysfunction - Hearing loss - Musculoskeletal disorders - Neurological disorders - Orthostatic hypotension        Personalized Health Maintenance & Screening Recommendations  Influenza vaccine Td vaccine Colorectal cancer screening  Lung Cancer Screening Recommended: no (Low Dose  CT Chest recommended if Age 37-80 years, 30 pack-year currently smoking OR have quit w/in past 15 years) Hepatitis C Screening recommended: no HIV Screening recommended: no  Advanced Directives: Written information was not prepared per patient's request.  Referrals & Orders No orders of the defined types were placed in this encounter.   Follow-up Plan . Follow-up with Chevis Pretty, FNP as planned . Schedule colonoscopy (was due in 2019)     I have personally reviewed and noted the following in the patient's chart:   . Medical and social history . Use of alcohol, tobacco or illicit drugs  . Current medications and supplements . Functional ability and status . Nutritional status . Physical activity . Advanced directives . List of other physicians . Hospitalizations, surgeries, and ER visits in previous 12 months . Vitals . Screenings to include cognitive, depression, and falls . Referrals and appointments  In addition, I have reviewed and discussed with Lindell Noe certain preventive protocols, quality metrics, and best practice recommendations. A written personalized care plan for preventive services as well as general preventive health recommendations is available and can be mailed to the patient at his request.      Felicity Coyer    02/08/2020   Patient declined after visit summary.

## 2020-02-24 DIAGNOSIS — U071 COVID-19: Secondary | ICD-10-CM

## 2020-02-24 HISTORY — DX: COVID-19: U07.1

## 2020-02-29 ENCOUNTER — Telehealth: Payer: Self-pay | Admitting: Pharmacist

## 2020-02-29 DIAGNOSIS — I10 Essential (primary) hypertension: Secondary | ICD-10-CM

## 2020-02-29 MED ORDER — LOSARTAN POTASSIUM 100 MG PO TABS: 100.0000 mg | ORAL_TABLET | Freq: Every day | ORAL | 3 refills | Status: DC

## 2020-02-29 NOTE — Telephone Encounter (Signed)
Pt called clinic stating BP has been higher in the AM with the colder weather. Seeing readings about 144/80 on a regular basis.  Also states he's been taking losartan 50mg  for about 1 year, our med list shows 25mg  daily. Sometimes forgets to take the 2nd dose of his hydralazine but plans to work on compliance. Adherent with his amlodipine 10mg .  Will increase losartan from 50mg  to 100mg  daily. He will continue on other meds. He sees His PCP in 3 weeks, BP will be rechecked at that time. Will send PCP a message to make sure they can add on BMET at that visit. If BP remains elevated, would increase dose of hydralazine next.

## 2020-03-15 ENCOUNTER — Other Ambulatory Visit: Payer: Self-pay

## 2020-03-15 ENCOUNTER — Encounter: Payer: Self-pay | Admitting: Nurse Practitioner

## 2020-03-15 ENCOUNTER — Ambulatory Visit (INDEPENDENT_AMBULATORY_CARE_PROVIDER_SITE_OTHER): Payer: PPO | Admitting: Nurse Practitioner

## 2020-03-15 VITALS — BP 144/83 | HR 56 | Temp 97.1°F | Resp 20 | Ht 68.0 in | Wt 159.0 lb

## 2020-03-15 DIAGNOSIS — M8588 Other specified disorders of bone density and structure, other site: Secondary | ICD-10-CM

## 2020-03-15 DIAGNOSIS — E119 Type 2 diabetes mellitus without complications: Secondary | ICD-10-CM

## 2020-03-15 DIAGNOSIS — I6930 Unspecified sequelae of cerebral infarction: Secondary | ICD-10-CM

## 2020-03-15 DIAGNOSIS — E782 Mixed hyperlipidemia: Secondary | ICD-10-CM

## 2020-03-15 DIAGNOSIS — I10 Essential (primary) hypertension: Secondary | ICD-10-CM | POA: Diagnosis not present

## 2020-03-15 LAB — CMP14+EGFR
ALT: 42 IU/L (ref 0–44)
AST: 30 IU/L (ref 0–40)
Albumin/Globulin Ratio: 1.5 (ref 1.2–2.2)
Albumin: 4.4 g/dL (ref 3.7–4.7)
Alkaline Phosphatase: 68 IU/L (ref 44–121)
BUN/Creatinine Ratio: 15 (ref 10–24)
BUN: 24 mg/dL (ref 8–27)
Bilirubin Total: 0.5 mg/dL (ref 0.0–1.2)
CO2: 21 mmol/L (ref 20–29)
Calcium: 9.2 mg/dL (ref 8.6–10.2)
Chloride: 106 mmol/L (ref 96–106)
Creatinine, Ser: 1.58 mg/dL — ABNORMAL HIGH (ref 0.76–1.27)
GFR calc Af Amer: 49 mL/min/{1.73_m2} — ABNORMAL LOW (ref >59)
GFR calc non Af Amer: 42 mL/min/{1.73_m2} — ABNORMAL LOW (ref >59)
Globulin, Total: 2.9 g/dL (ref 1.5–4.5)
Glucose: 123 mg/dL — ABNORMAL HIGH (ref 65–99)
Potassium: 5.1 mmol/L (ref 3.5–5.2)
Sodium: 140 mmol/L (ref 134–144)
Total Protein: 7.3 g/dL (ref 6.0–8.5)

## 2020-03-15 LAB — CBC WITH DIFFERENTIAL/PLATELET
Basophils Absolute: 0.1 10*3/uL (ref 0.0–0.2)
Basos: 1 %
EOS (ABSOLUTE): 0.4 10*3/uL (ref 0.0–0.4)
Eos: 6 %
Hematocrit: 37 % — ABNORMAL LOW (ref 37.5–51.0)
Hemoglobin: 12.8 g/dL — ABNORMAL LOW (ref 13.0–17.7)
Immature Grans (Abs): 0 10*3/uL (ref 0.0–0.1)
Immature Granulocytes: 0 %
Lymphocytes Absolute: 1.2 10*3/uL (ref 0.7–3.1)
Lymphs: 18 %
MCH: 31.6 pg (ref 26.6–33.0)
MCHC: 34.6 g/dL (ref 31.5–35.7)
MCV: 91 fL (ref 79–97)
Monocytes Absolute: 0.4 10*3/uL (ref 0.1–0.9)
Monocytes: 6 %
Neutrophils Absolute: 4.6 10*3/uL (ref 1.4–7.0)
Neutrophils: 69 %
Platelets: 268 10*3/uL (ref 150–450)
RBC: 4.05 x10E6/uL — ABNORMAL LOW (ref 4.14–5.80)
RDW: 12.3 % (ref 11.6–15.4)
WBC: 6.7 10*3/uL (ref 3.4–10.8)

## 2020-03-15 LAB — LIPID PANEL
Chol/HDL Ratio: 3.1 ratio (ref 0.0–5.0)
Cholesterol, Total: 119 mg/dL (ref 100–199)
HDL: 39 mg/dL — ABNORMAL LOW (ref >39)
LDL Chol Calc (NIH): 64 mg/dL (ref 0–99)
Triglycerides: 81 mg/dL (ref 0–149)
VLDL Cholesterol Cal: 16 mg/dL (ref 5–40)

## 2020-03-15 LAB — BAYER DCA HB A1C WAIVED: HB A1C (BAYER DCA - WAIVED): 5.9 % (ref <7.0)

## 2020-03-15 MED ORDER — GLIMEPIRIDE 4 MG PO TABS: ORAL_TABLET | ORAL | 1 refills | Status: DC

## 2020-03-15 NOTE — Progress Notes (Signed)
Subjective:    Patient ID: Peter York, male    DOB: 07-19-1945, 74 y.o.   MRN: [id number]   Chief Complaint: Medical Management of Chronic Issues    HPI:  1. Primary hypertension No c/o chest pain, sob or headache. Does not check blood pressure at home. BP Readings from Last 3 Encounters:  03/15/20 (!) 144/83  12/03/19 138/80  08/25/19 132/74     2. Mixed hyperlipidemia Doe swatch diet and stays very active. Lab Results  Component Value Date   CHOL 145 12/03/2019   HDL 35 (L) 12/03/2019   LDLCALC 92 12/03/2019   TRIG 98 12/03/2019   CHOLHDL 4.1 12/03/2019     3. Type 2 diabetes mellitus without complication, without long-term current use of insulin (HCC) Fasting blood sugars are running below 140 consistently. He denies any low blood sugars. Lab Results  Component Value Date   HGBA1C 6.2 12/03/2019     4. Late effect of cerebrovascular accident (CVA) Has no residual effects. No symptoms of any bleed.  5. Osteoporosis of vertebra He had dexascan done in february 2021. t score was -1.9 which is osteopenia now. He takes daily vitamin d and calcium.    Outpatient Encounter Medications as of 03/15/2020  Medication Sig   acetaminophen (TYLENOL) 650 MG CR tablet Take 650 mg by mouth daily as needed for pain.   amLODipine (NORVASC) 10 MG tablet Take 1 tablet (10 mg total) by mouth daily.   atorvastatin (LIPITOR) 10 MG tablet Take 1 tablet (10 mg total) by mouth daily.   Calcium Carbonate-Vitamin D (CALCIUM-D PO) Take 1 tablet by mouth at bedtime.   colchicine 0.6 MG tablet Take 0.6 mg by mouth daily as needed.    ezetimibe (ZETIA) 10 MG tablet Take 1 tablet (10 mg total) by mouth daily.   fluticasone (FLONASE) 50 MCG/ACT nasal spray SPRAY 2 SPRAYS INTO EACH NOSTRIL EVERY DAY   hydrALAZINE (APRESOLINE) 25 MG tablet TAKE 1 TABLET BY MOUTH THREE TIMES A DAY   losartan (COZAAR) 100 MG tablet Take 1 tablet (100 mg total) by mouth daily.   ONETOUCH ULTRA  test strip USE TO CHECK BLOOD SUGAR ONCE A DAY AS INSTRUCTED   Semaglutide,0.25 or 0.5MG/DOS, (OZEMPIC, 0.25 OR 0.5 MG/DOSE,) 2 MG/1.5ML SOPN Inject 0.5 mg into the skin once a week.   Specialty Vitamins Products (PROSTATE PO) Take 1 tablet by mouth 2 (two) times a day. Super Beta Prostate   vitamin E 400 UNIT capsule Take 1 capsule (400 Units total) by mouth daily.   No facility-administered encounter medications on file as of 03/15/2020.    Past Surgical History:  Procedure Laterality Date   ANAL FISTULECTOMY  10/02/2011   Procedure: FISTULECTOMY ANAL;  Surgeon: Joyice Faster. Cornett, MD;  Location: [place];  Service: General;  Laterality: N/A;  excision perianal mass    APPENDECTOMY     COLONOSCOPY W/ POLYPECTOMY  12/31/2005   Dr. Silvano Rusk   UMBILICAL HERNIA REPAIR      Family History  Problem Relation Age of Onset   Colon cancer Father 16   Colon polyps Father    Cancer Father 43       Colon   Heart attack Maternal Uncle        X 4   Heart disease Maternal Uncle    Heart disease Maternal Uncle    Heart disease Maternal Uncle    Diabetes Daughter 49       Type 1  Transient ischemic attack Brother 48    New complaints: None today  Social history: Lives with wife. Still judging dog competitions.  Controlled substance contract: n/a    Review of Systems  Constitutional: Negative for diaphoresis.  Eyes: Negative for pain.  Respiratory: Negative for shortness of breath.   Cardiovascular: Negative for chest pain, palpitations and leg swelling.  Gastrointestinal: Negative for abdominal pain.  Endocrine: Negative for polydipsia.  Skin: Negative for rash.  Neurological: Negative for dizziness, weakness and headaches.  Hematological: Does not bruise/bleed easily.  All other systems reviewed and are negative.      Objective:   Physical Exam Vitals and nursing note reviewed.  Constitutional:      Appearance: Normal appearance. He is  well-developed and well-nourished.  HENT:     Head: Normocephalic.     Nose: Nose normal.     Mouth/Throat:     Mouth: Oropharynx is clear and moist.  Eyes:     Extraocular Movements: EOM normal.     Pupils: Pupils are equal, round, and reactive to light.  Neck:     Thyroid: No thyroid mass or thyromegaly.     Vascular: No carotid bruit or JVD.     Trachea: Phonation normal.  Cardiovascular:     Rate and Rhythm: Normal rate and regular rhythm.  Pulmonary:     Effort: Pulmonary effort is normal. No respiratory distress.     Breath sounds: Normal breath sounds.  Abdominal:     General: Bowel sounds are normal. Aorta is normal.     Palpations: Abdomen is soft.     Tenderness: There is no abdominal tenderness.  Musculoskeletal:        General: Normal range of motion.     Cervical back: Normal range of motion and neck supple.  Lymphadenopathy:     Cervical: No cervical adenopathy.  Skin:    General: Skin is warm and dry.  Neurological:     Mental Status: He is alert and oriented to person, place, and time.  Psychiatric:        Mood and Affect: Mood and affect normal.        Behavior: Behavior normal.        Thought Content: Thought content normal.        Judgment: Judgment normal.    BP (!) 144/83    Pulse (!) 56    Temp (!) 97.1 F (36.2 C) (Temporal)    Resp 20    Ht _0  (1.727 m)    Wt 159 lb (72.1 kg)    SpO2 99%    BMI 24.18 kg/m   HGBA1c 5.9%     Assessment & Plan:  Peter York comes in today with chief complaint of Medical Management of Chronic Issues   Diagnosis and orders addressed:  1. Primary hypertension Low sodium diet - CBC with Differential/Platelet - CMP14+EGFR  2. Mixed hyperlipidemia Low fat diet - Lipid panel  3. Type 2 diabetes mellitus without complication, without long-term current use of insulin (HCC) Continue to wtahc carbs in diet - Bayer DCA Hb A1c Waived - Microalbumin / creatinine urine ratio - glimepiride (AMARYL) 4 MG  tablet; 1/2 tablet at supper time.  Dispense: 90 tablet; Refill: 1  4. Late effect of cerebrovascular accident (CVA)  5. Osteopenia of lumbar spine Weight bearing exercises   Labs pending Health Maintenance reviewed Diet and exercise encouraged  Follow up plan: 3 months   Mary-Margaret Hassell Done, FNP

## 2020-03-15 NOTE — Patient Instructions (Signed)
Diabetes Mellitus and Foot Care Foot care is an important part of your health, especially when you have diabetes. Diabetes may cause you to have problems because of poor blood flow (circulation) to your feet and legs, which can cause your skin to:  Become thinner and drier.  Break more easily.  Heal more slowly.  Peel and crack. You may also have nerve damage (neuropathy) in your legs and feet, causing decreased feeling in them. This means that you may not notice minor injuries to your feet that could lead to more serious problems. Noticing and addressing any potential problems early is the best way to prevent future foot problems. How to care for your feet Foot hygiene  Wash your feet daily with warm water and mild soap. Do not use hot water. Then, pat your feet and the areas between your toes until they are completely dry. Do not soak your feet as this can dry your skin.  Trim your toenails straight across. Do not dig under them or around the cuticle. File the edges of your nails with an emery board or nail file.  Apply a moisturizing lotion or petroleum jelly to the skin on your feet and to dry, brittle toenails. Use lotion that does not contain alcohol and is unscented. Do not apply lotion between your toes. Shoes and socks  Wear clean socks or stockings every day. Make sure they are not too tight. Do not wear knee-high stockings since they may decrease blood flow to your legs.  Wear shoes that fit properly and have enough cushioning. Always look in your shoes before you put them on to be sure there are no objects inside.  To break in new shoes, wear them for just a few hours a day. This prevents injuries on your feet. Wounds, scrapes, corns, and calluses  Check your feet daily for blisters, cuts, bruises, sores, and redness. If you cannot see the bottom of your feet, use a mirror or ask someone for help.  Do not cut corns or calluses or try to remove them with medicine.  If you  find a minor scrape, cut, or break in the skin on your feet, keep it and the skin around it clean and dry. You may clean these areas with mild soap and water. Do not clean the area with peroxide, alcohol, or iodine.  If you have a wound, scrape, corn, or callus on your foot, look at it several times a day to make sure it is healing and not infected. Check for: ? Redness, swelling, or pain. ? Fluid or blood. ? Warmth. ? Pus or a bad smell. General instructions  Do not cross your legs. This may decrease blood flow to your feet.  Do not use heating pads or hot water bottles on your feet. They may burn your skin. If you have lost feeling in your feet or legs, you may not know this is happening until it is too late.  Protect your feet from hot and cold by wearing shoes, such as at the beach or on hot pavement.  Schedule a complete foot exam at least once a year (annually) or more often if you have foot problems. If you have foot problems, report any cuts, sores, or bruises to your health care provider immediately. Contact a health care provider if:  You have a medical condition that increases your risk of infection and you have any cuts, sores, or bruises on your feet.  You have an injury that is not   healing.  You have redness on your legs or feet.  You feel burning or tingling in your legs or feet.  You have pain or cramps in your legs and feet.  Your legs or feet are numb.  Your feet always feel cold.  You have pain around a toenail. Get help right away if:  You have a wound, scrape, corn, or callus on your foot and: ? You have pain, swelling, or redness that gets worse. ? You have fluid or blood coming from the wound, scrape, corn, or callus. ? Your wound, scrape, corn, or callus feels warm to the touch. ? You have pus or a bad smell coming from the wound, scrape, corn, or callus. ? You have a fever. ? You have a red line going up your leg. Summary  Check your feet every day  for cuts, sores, red spots, swelling, and blisters.  Moisturize feet and legs daily.  Wear shoes that fit properly and have enough cushioning.  If you have foot problems, report any cuts, sores, or bruises to your health care provider immediately.  Schedule a complete foot exam at least once a year (annually) or more often if you have foot problems. This information is not intended to replace advice given to you by your health care provider. Make sure you discuss any questions you have with your health care provider. Document Revised: 12/03/2018 Document Reviewed: 04/13/2016 Elsevier Patient Education  2020 [employer]  

## 2020-03-16 LAB — MICROALBUMIN / CREATININE URINE RATIO
Creatinine, Urine: 150 mg/dL
Microalb/Creat Ratio: 42 mg/g creat — ABNORMAL HIGH (ref 0–29)
Microalbumin, Urine: 63.5 ug/mL

## 2020-04-24 ENCOUNTER — Other Ambulatory Visit: Payer: Self-pay | Admitting: Nurse Practitioner

## 2020-04-24 DIAGNOSIS — J309 Allergic rhinitis, unspecified: Secondary | ICD-10-CM

## 2020-05-31 ENCOUNTER — Other Ambulatory Visit: Payer: Self-pay | Admitting: Nurse Practitioner

## 2020-05-31 DIAGNOSIS — I10 Essential (primary) hypertension: Secondary | ICD-10-CM

## 2020-06-20 ENCOUNTER — Ambulatory Visit (INDEPENDENT_AMBULATORY_CARE_PROVIDER_SITE_OTHER): Payer: PPO | Admitting: Nurse Practitioner

## 2020-06-20 ENCOUNTER — Encounter: Payer: Self-pay | Admitting: Nurse Practitioner

## 2020-06-20 ENCOUNTER — Other Ambulatory Visit: Payer: Self-pay

## 2020-06-20 VITALS — BP 126/65 | HR 56 | Temp 97.1°F | Ht 68.0 in | Wt 158.0 lb

## 2020-06-20 DIAGNOSIS — E119 Type 2 diabetes mellitus without complications: Secondary | ICD-10-CM | POA: Diagnosis not present

## 2020-06-20 DIAGNOSIS — S22060D Wedge compression fracture of T7-T8 vertebra, subsequent encounter for fracture with routine healing: Secondary | ICD-10-CM

## 2020-06-20 DIAGNOSIS — I6930 Unspecified sequelae of cerebral infarction: Secondary | ICD-10-CM | POA: Diagnosis not present

## 2020-06-20 DIAGNOSIS — S22060S Wedge compression fracture of T7-T8 vertebra, sequela: Secondary | ICD-10-CM | POA: Diagnosis not present

## 2020-06-20 DIAGNOSIS — E782 Mixed hyperlipidemia: Secondary | ICD-10-CM

## 2020-06-20 DIAGNOSIS — Z1212 Encounter for screening for malignant neoplasm of rectum: Secondary | ICD-10-CM

## 2020-06-20 DIAGNOSIS — I10 Essential (primary) hypertension: Secondary | ICD-10-CM

## 2020-06-20 DIAGNOSIS — I619 Nontraumatic intracerebral hemorrhage, unspecified: Secondary | ICD-10-CM | POA: Diagnosis not present

## 2020-06-20 LAB — BAYER DCA HB A1C WAIVED: HB A1C (BAYER DCA - WAIVED): 6.1 % (ref <7.0)

## 2020-06-20 MED ORDER — AMLODIPINE BESYLATE 10 MG PO TABS: 10.0000 mg | ORAL_TABLET | Freq: Every day | ORAL | 1 refills | Status: DC

## 2020-06-20 MED ORDER — OZEMPIC (0.25 OR 0.5 MG/DOSE) 2 MG/1.5ML ~~LOC~~ SOPN: 0.5000 mg | PEN_INJECTOR | SUBCUTANEOUS | 3 refills | Status: DC

## 2020-06-20 MED ORDER — GLIMEPIRIDE 4 MG PO TABS: ORAL_TABLET | ORAL | 1 refills | Status: DC

## 2020-06-20 MED ORDER — EZETIMIBE 10 MG PO TABS: 10.0000 mg | ORAL_TABLET | Freq: Every day | ORAL | 1 refills | Status: DC

## 2020-06-20 MED ORDER — ATORVASTATIN CALCIUM 10 MG PO TABS: 10.0000 mg | ORAL_TABLET | Freq: Every day | ORAL | 1 refills | Status: DC

## 2020-06-20 MED ORDER — LOSARTAN POTASSIUM 100 MG PO TABS: 100.0000 mg | ORAL_TABLET | Freq: Every day | ORAL | 1 refills | Status: DC

## 2020-06-20 NOTE — Progress Notes (Signed)
Subjective:    Patient ID: Peter York, male    DOB: 1945-10-03, 75 y.o.   MRN: [id number]   Chief Complaint:    HPI:  1. Mixed hyperlipidemia Does try to watch diet. Stays very active. Lab Results  Component Value Date   CHOL 119 03/15/2020   HDL 39 (L) 03/15/2020   LDLCALC 64 03/15/2020   TRIG 81 03/15/2020   CHOLHDL 3.1 03/15/2020     2. Type 2 diabetes mellitus without complication, without long-term current use of insulin (HCC) Fasting blood sugars re running less than 130 most mornings. Lab Results  Component Value Date   HGBA1C 5.9 03/15/2020     3. Benign essential HTN No c/o chest pain, sob ir headache. Doe snot check blood pressure at home. BP Readings from Last 3 Encounters:  06/20/20 126/65  03/15/20 (!) 144/83  12/03/19 138/80      4. Late effect of cerebrovascular accident (CVA) No residual effects.  5. 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  6. 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    Outpatient Encounter Medications as of 06/20/2020  Medication Sig  . acetaminophen (TYLENOL) 650 MG CR tablet Take 650 mg by mouth daily as needed for pain.  [name] amLODipine (NORVASC) 10 MG tablet Take 1 tablet (10 mg total) by mouth daily.  [name] atorvastatin (LIPITOR) 10 MG tablet Take 1 tablet (10 mg total) by mouth daily.  . Calcium Carbonate-Vitamin D (CALCIUM-D PO) Take 1 tablet by mouth at bedtime.  . colchicine 0.6 MG tablet Take 0.6 mg by mouth daily as needed.   . ezetimibe (ZETIA) 10 MG tablet Take 1 tablet (10 mg total) by mouth daily.  . fluticasone (FLONASE) 50 MCG/ACT nasal spray SPRAY 2 SPRAYS INTO EACH NOSTRIL EVERY DAY  . glimepiride (AMARYL) 4 MG tablet 1/2 tablet at supper time.  . hydrALAZINE (APRESOLINE) 25 MG tablet TAKE 1 TABLET BY MOUTH THREE TIMES A DAY  . losartan (COZAAR) 100 MG tablet  Take 1 tablet (100 mg total) by mouth daily.  [name] test strip USE TO CHECK BLOOD SUGAR ONCE A DAY AS INSTRUCTED  . Semaglutide,0.25 or 0.5MG/DOS, (OZEMPIC, 0.25 OR 0.5 MG/DOSE,) 2 MG/1.5ML SOPN Inject 0.5 mg into the skin once a week.  [name] Specialty Vitamins Products (PROSTATE PO) Take 1 tablet by mouth 2 (two) times a day. Super Beta Prostate  . vitamin E 400 UNIT capsule Take 1 capsule (400 Units total) by mouth daily.   No facility-administered encounter medications on file as of 06/20/2020.    Past Surgical History:  Procedure Laterality Date  . ANAL FISTULECTOMY  10/02/2011   Procedure: FISTULECTOMY ANAL;  Surgeon: Joyice Faster. Cornett, MD;  Location: [place];  Service: General;  Laterality: N/A;  excision perianal mass   . APPENDECTOMY    . COLONOSCOPY W/ POLYPECTOMY  12/31/2005   Dr. Silvano Rusk  . UMBILICAL HERNIA REPAIR      Family History  Problem Relation Age of Onset  . Colon cancer Father 72  . Colon polyps Father   . Cancer Father 20       Colon  . Heart attack Maternal Uncle        X 4  . Heart disease Maternal Uncle   . Heart disease Maternal Uncle   . Heart disease Maternal Uncle   . Diabetes  Daughter 13       Type 1  . Transient ischemic attack Brother 66    New complaints: None today  Social history: Lives with wife and daughter. Runs a local flower shop  Controlled substance contract: n/a    Review of Systems  Constitutional: Negative for diaphoresis.  Eyes: Negative for pain.  Respiratory: Negative for shortness of breath.   Cardiovascular: Negative for chest pain, palpitations and leg swelling.  Gastrointestinal: Negative for abdominal pain.  Endocrine: Negative for polydipsia.  Skin: Negative for rash.  Neurological: Negative for dizziness, weakness and headaches.  Hematological: Does not bruise/bleed easily.  All other systems reviewed and are negative.      Objective:   Physical Exam Vitals and nursing note  reviewed.  Constitutional:      Appearance: Normal appearance. He is well-developed.  HENT:     Head: Normocephalic.     Nose: Nose normal.  Eyes:     Pupils: Pupils are equal, round, and reactive to light.  Neck:     Thyroid: No thyroid mass or thyromegaly.     Vascular: No carotid bruit or JVD.     Trachea: Phonation normal.  Cardiovascular:     Rate and Rhythm: Normal rate and regular rhythm.  Pulmonary:     Effort: Pulmonary effort is normal. No respiratory distress.     Breath sounds: Normal breath sounds.  Abdominal:     General: Bowel sounds are normal.     Palpations: Abdomen is soft.     Tenderness: There is no abdominal tenderness.  Musculoskeletal:        General: Normal range of motion.     Cervical back: Normal range of motion and neck supple.  Lymphadenopathy:     Cervical: No cervical adenopathy.  Skin:    General: Skin is warm and dry.  Neurological:     Mental Status: He is alert and oriented to person, place, and time.  Psychiatric:        Behavior: Behavior normal.        Thought Content: Thought content normal.        Judgment: Judgment normal.     BP 126/65   Pulse (!) 56   Temp (!) 97.1 F (36.2 C) (Temporal)   Ht 5\' 8"  (1.727 m)   Wt 158 lb (71.7 kg)   BMI 24.02 kg/m   HGBA1c 6.4%      Assessment & Plan:  Peter York comes in today with chief complaint of Medical Management of Chronic Issues   Diagnosis and orders addressed:  1. Mixed hyperlipidemia Low fta diet - CMP14+EGFR - Lipid panel - losartan (COZAAR) 100 MG tablet; Take 1 tablet (100 mg total) by mouth daily.  Dispense: 90 tablet; Refill: 1 - amLODipine (NORVASC) 10 MG tablet; Take 1 tablet (10 mg total) by mouth daily.  Dispense: 90 tablet; Refill: 1 - ezetimibe (ZETIA) 10 MG tablet; Take 1 tablet (10 mg total) by mouth daily.  Dispense: 90 tablet; Refill: 1 - atorvastatin (LIPITOR) 10 MG tablet; Take 1 tablet (10 mg total) by mouth daily.  Dispense: 90 tablet; Refill:  1  2. Type 2 diabetes mellitus without complication, without long-term current use of insulin (HCC) Continue to watch crbsin diet - CBC with Differential/Platelet - Bayer DCA Hb A1c Waived - glimepiride (AMARYL) 4 MG tablet; 1/2 tablet at supper time.  Dispense: 90 tablet; Refill: 1 - Semaglutide,0.25 or 0.5MG /DOS, (OZEMPIC, 0.25 OR 0.5 MG/DOSE,) 2 MG/1.5ML SOPN; Inject 0.5 mg  into the skin once a week.  Dispense: 6 mL; Refill: 3  3. Benign essential HTN Low sodium diet  4. Late effect of cerebrovascular accident (CVA) Fall prevention  5. Hemorrhagic stroke ([place]) Continue daily baby ASA  6. Compression fracture of T7 vertebra with routine healing, subsequent encounter Will keep check of back. Report any back pain  7. Encounter for screening for malignant neoplasm of rectum - Ambulatory referral to Gastroenterology   Labs pending Health Maintenance reviewed Diet and exercise encouraged  Follow up plan: 6 months   Mary-Margaret Hassell Done, FNP

## 2020-06-20 NOTE — Patient Instructions (Signed)

## 2020-06-21 LAB — CBC WITH DIFFERENTIAL/PLATELET
Basophils Absolute: 0.1 10*3/uL (ref 0.0–0.2)
Basos: 1 %
EOS (ABSOLUTE): 0.3 10*3/uL (ref 0.0–0.4)
Eos: 6 %
Hematocrit: 35.5 % — ABNORMAL LOW (ref 37.5–51.0)
Hemoglobin: 12.2 g/dL — ABNORMAL LOW (ref 13.0–17.7)
Immature Grans (Abs): 0 10*3/uL (ref 0.0–0.1)
Immature Granulocytes: 0 %
Lymphocytes Absolute: 1.2 10*3/uL (ref 0.7–3.1)
Lymphs: 21 %
MCH: 31.9 pg (ref 26.6–33.0)
MCHC: 34.4 g/dL (ref 31.5–35.7)
MCV: 93 fL (ref 79–97)
Monocytes Absolute: 0.4 10*3/uL (ref 0.1–0.9)
Monocytes: 6 %
Neutrophils Absolute: 3.6 10*3/uL (ref 1.4–7.0)
Neutrophils: 66 %
Platelets: 265 10*3/uL (ref 150–450)
RBC: 3.83 x10E6/uL — ABNORMAL LOW (ref 4.14–5.80)
RDW: 13.1 % (ref 11.6–15.4)
WBC: 5.6 10*3/uL (ref 3.4–10.8)

## 2020-06-21 LAB — CMP14+EGFR
ALT: 21 IU/L (ref 0–44)
AST: 17 IU/L (ref 0–40)
Albumin/Globulin Ratio: 1.5 (ref 1.2–2.2)
Albumin: 4.5 g/dL (ref 3.7–4.7)
Alkaline Phosphatase: 71 IU/L (ref 44–121)
BUN/Creatinine Ratio: 14 (ref 10–24)
BUN: 24 mg/dL (ref 8–27)
Bilirubin Total: 0.5 mg/dL (ref 0.0–1.2)
CO2: 20 mmol/L (ref 20–29)
Calcium: 9.2 mg/dL (ref 8.6–10.2)
Chloride: 104 mmol/L (ref 96–106)
Creatinine, Ser: 1.72 mg/dL — ABNORMAL HIGH (ref 0.76–1.27)
Globulin, Total: 3.1 g/dL (ref 1.5–4.5)
Glucose: 129 mg/dL — ABNORMAL HIGH (ref 65–99)
Potassium: 5.3 mmol/L — ABNORMAL HIGH (ref 3.5–5.2)
Sodium: 138 mmol/L (ref 134–144)
Total Protein: 7.6 g/dL (ref 6.0–8.5)
eGFR: 41 mL/min/{1.73_m2} — ABNORMAL LOW (ref >59)

## 2020-06-21 LAB — LIPID PANEL
Chol/HDL Ratio: 4.7 ratio (ref 0.0–5.0)
Cholesterol, Total: 189 mg/dL (ref 100–199)
HDL: 40 mg/dL (ref >39)
LDL Chol Calc (NIH): 129 mg/dL — ABNORMAL HIGH (ref 0–99)
Triglycerides: 112 mg/dL (ref 0–149)
VLDL Cholesterol Cal: 20 mg/dL (ref 5–40)

## 2020-06-24 IMAGING — MR MRI HEAD WITHOUT CONTRAST
10 of 11 series · 43 of 48 positions shown · non-contrast
Comparison: Head CT and CTA from yesterday

CLINICAL DATA: Abnormal head CT

EXAM:
MRI HEAD WITHOUT CONTRAST
TECHNIQUE: Multiplanar, multiecho pulse sequences of the brain and surrounding
structures were obtained without intravenous contrast.

[Series 5: DWI · axial · 3.0mm · 0.88mm/px · z∈[-40,+93]mm · 10 of 96 slices shown (1 of 4)]
[im 1/96]
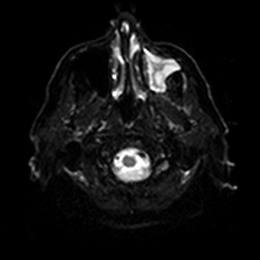
[im 11/96]
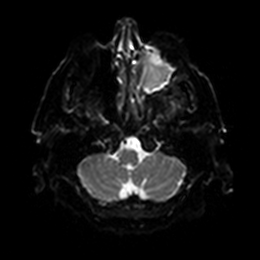
[im 22/96]
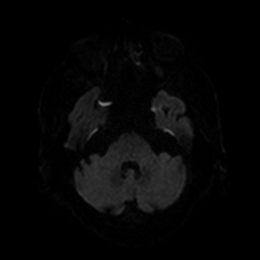
[im 32/96]
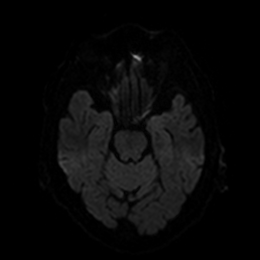
[im 43/96]
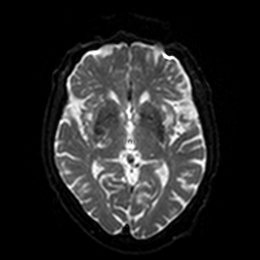
[im 53/96]
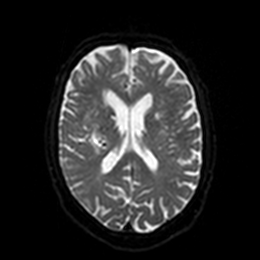
[im 64/96]
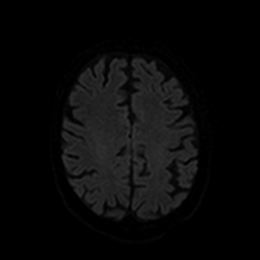
[im 74/96]
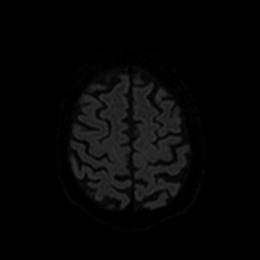
[im 85/96]
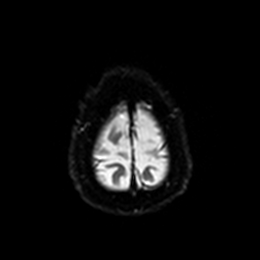
[im 96/96]
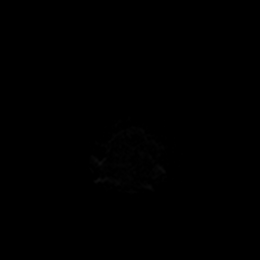

[Series 6: DWI · axial · 3.0mm · 0.88mm/px · z∈[-40,+93]mm · 5 of 48 slices shown (2 of 4)]
[im 1/48]
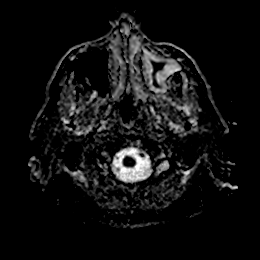
[im 12/48]
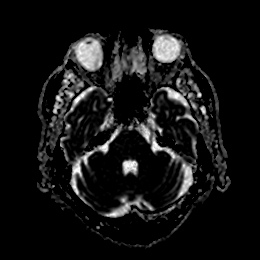
[im 24/48]
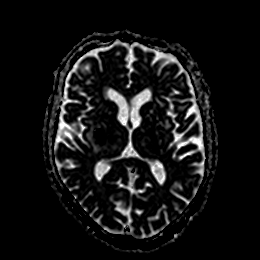
[im 36/48]
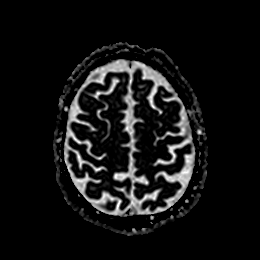
[im 48/48]
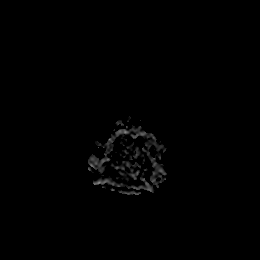

[Series 7: DWI · coronal · 4.0mm · 0.88mm/px · 6 of 72 slices shown (3 of 4)]
[im 1/72]
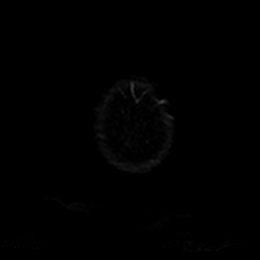
[im 15/72]
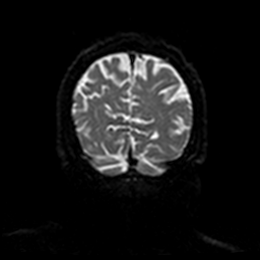
[im 29/72]
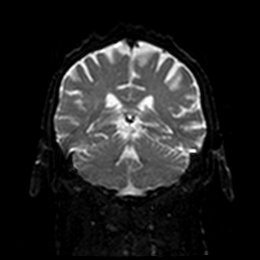
[im 43/72]
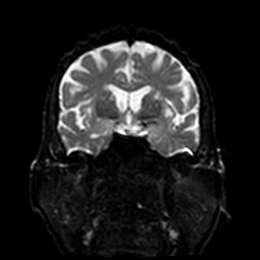
[im 57/72]
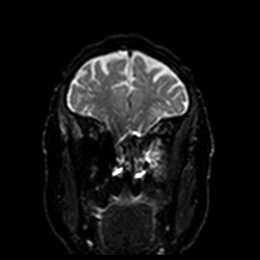
[im 72/72]
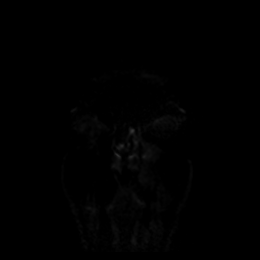

[Series 8: DWI · coronal · 4.0mm · 0.88mm/px · 3 of 35 slices shown (4 of 4)]
[im 1/35]
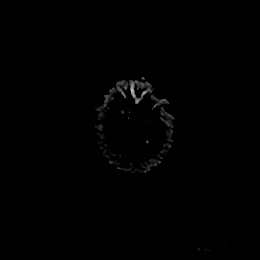
[im 18/35]
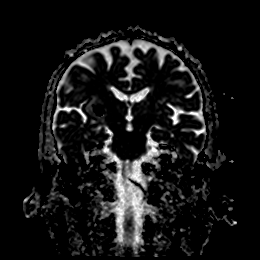
[im 35/35]
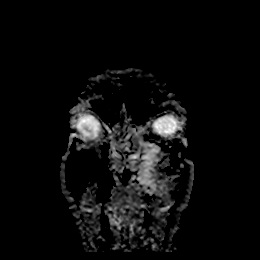

[Series 9: T1 · sagittal · 5.0mm · 0.75mm/px · 2 of 25 slices shown]
[im 1/25]
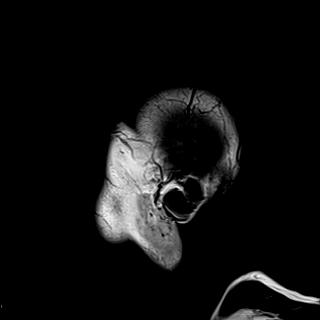
[im 25/25]
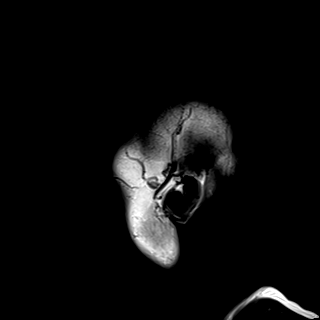

[Series 10: T2 · axial · 5.0mm · 0.72mm/px · z∈[-48,+94]mm · 2 of 26 slices shown (1 of 2)]
[im 1/26]
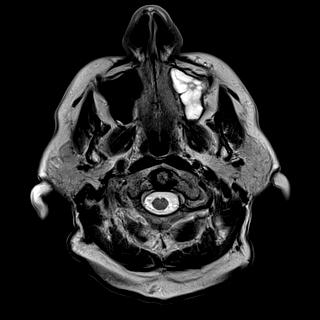
[im 26/26]
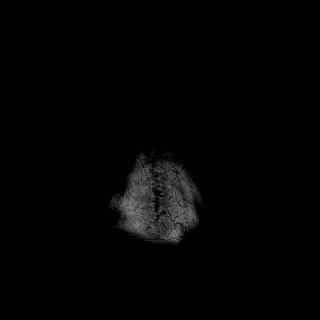

[Series 11: FLAIR · axial · 5.0mm · 0.45mm/px · z∈[-46,+96]mm · 2 of 26 slices shown]
[im 1/26]
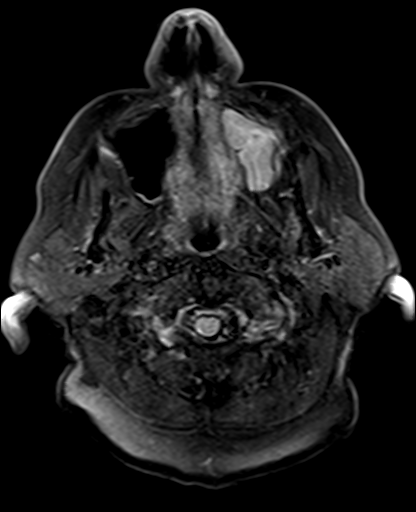
[im 26/26]
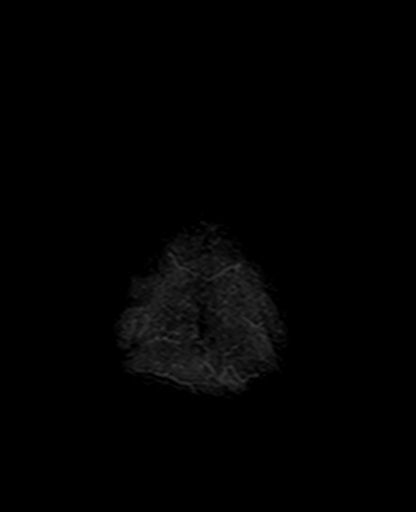

[Series 13: pha_images · axial · 3.0mm · 0.90mm/px · z∈[-60,+103]mm · 5 of 57 slices shown]
[im 1/57]
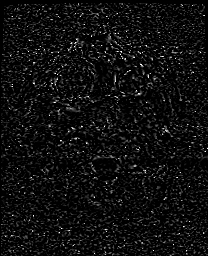
[im 15/57]
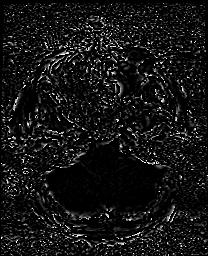
[im 29/57]
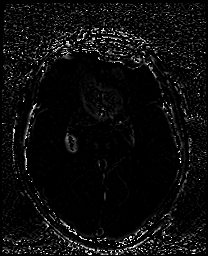
[im 43/57]
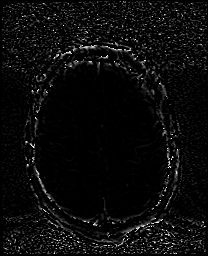
[im 57/57]
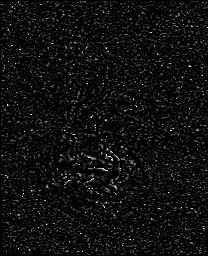

[Series 14: swi_images · axial · 3.0mm · 0.90mm/px · z∈[-60,+108]mm · 5 of 60 slices shown]
[im 1/60]
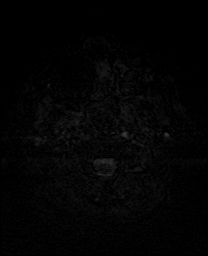
[im 15/60]
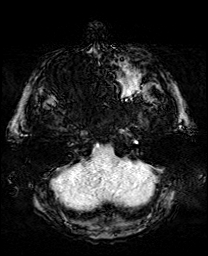
[im 30/60]
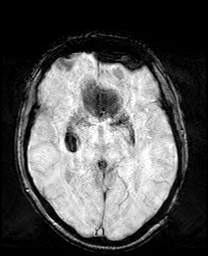
[im 45/60]
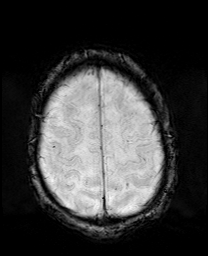
[im 60/60]
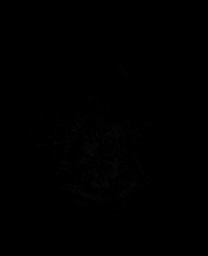

[Series 17: T2 · coronal · 5.0mm · 0.34mm/px · 3 of 32 slices shown (2 of 2)]
[im 1/32]
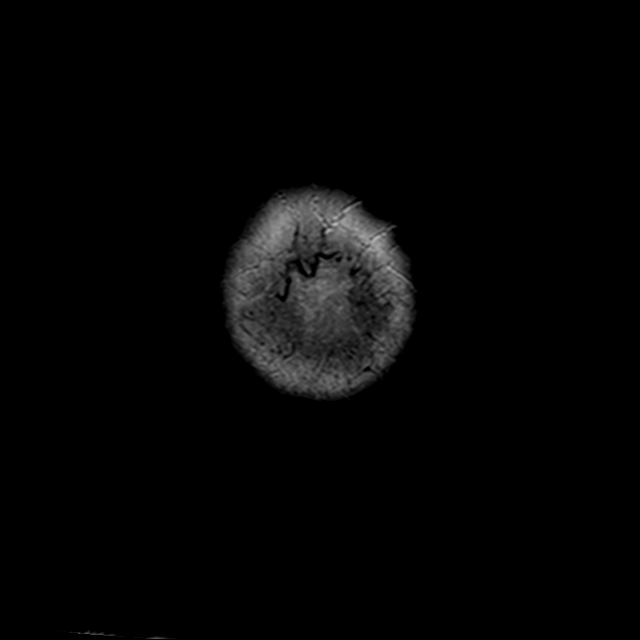
[im 16/32]
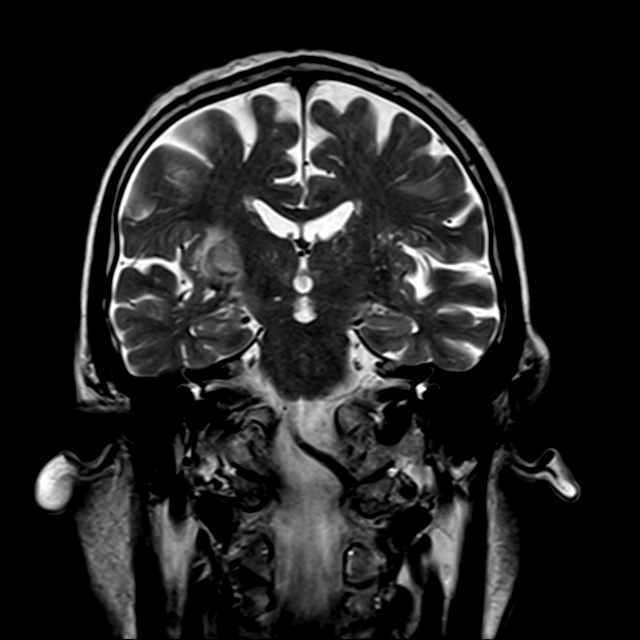
[im 32/32]
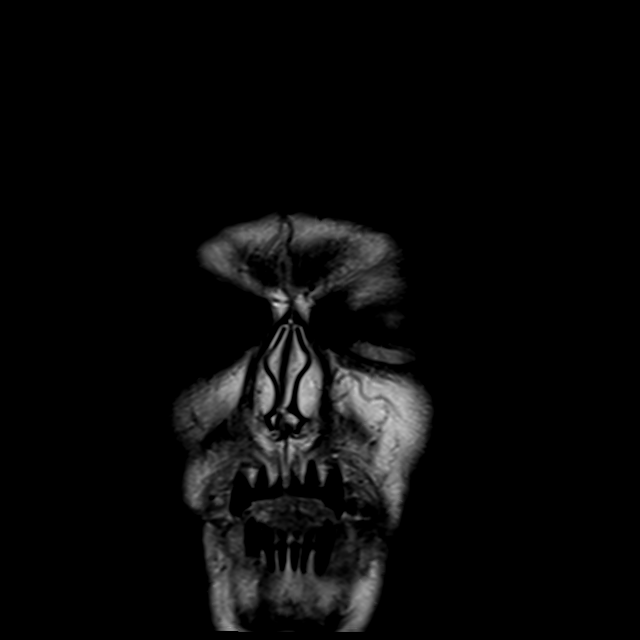

[43 of 48 positions shown; findings below may reference images not displayed]

FINDINGS: Brain: T2 iso- and T1 mildly hyper-intense acute hematoma centered
in the right putamen, measuring 16 mm. There is a rim of mild
vasogenic edema. Diffusion signal extends superiorly towards the
lateral ventricle that is blood products rather than neighboring
infarct based on preceding CTA. No suspected underlying infarct. No
evidence of mass. No evidence of amyloid angiopathy. There is mild
chronic small vessel ischemic type change in the cerebral white
matter.

Vascular: Major flow voids are preserved, reference preceding CTA
concerning and anterior communicating artery region aneurysm.

Skull and upper cervical spine: Negative for marrow lesion

Sinuses/Orbits: Left maxillary sinusitis with central inspissated
material and peripheral mucosal thickening. Is also left anterior
ethmoid sinusitis.
IMPRESSION: 1. Small hematoma in the right basal ganglia without evidence of
underlying lesion. No evidence of amyloid angiopathy. Chronic small
vessel disease is mild.
2. Obstructed left maxillary sinus.

## 2020-06-28 DIAGNOSIS — R972 Elevated prostate specific antigen [PSA]: Secondary | ICD-10-CM | POA: Diagnosis not present

## 2020-07-05 DIAGNOSIS — R351 Nocturia: Secondary | ICD-10-CM | POA: Diagnosis not present

## 2020-07-05 DIAGNOSIS — N401 Enlarged prostate with lower urinary tract symptoms: Secondary | ICD-10-CM | POA: Diagnosis not present

## 2020-07-05 DIAGNOSIS — R972 Elevated prostate specific antigen [PSA]: Secondary | ICD-10-CM | POA: Diagnosis not present

## 2020-08-01 ENCOUNTER — Other Ambulatory Visit: Payer: Self-pay | Admitting: Neurosurgery

## 2020-08-02 ENCOUNTER — Other Ambulatory Visit: Payer: Self-pay | Admitting: Neurosurgery

## 2020-08-02 DIAGNOSIS — I671 Cerebral aneurysm, nonruptured: Secondary | ICD-10-CM

## 2020-08-12 ENCOUNTER — Other Ambulatory Visit: Payer: Self-pay | Admitting: Nurse Practitioner

## 2020-08-12 DIAGNOSIS — J309 Allergic rhinitis, unspecified: Secondary | ICD-10-CM

## 2020-08-18 ENCOUNTER — Encounter: Payer: Self-pay | Admitting: Internal Medicine

## 2020-08-18 ENCOUNTER — Ambulatory Visit: Payer: PPO | Admitting: Internal Medicine

## 2020-08-18 VITALS — BP 142/72 | HR 84 | Ht 66.0 in | Wt 156.0 lb

## 2020-08-18 DIAGNOSIS — Z86010 Personal history of colonic polyps: Secondary | ICD-10-CM

## 2020-08-18 NOTE — Patient Instructions (Signed)
You have been scheduled for a colonoscopy. Please follow written instructions given to you at your visit today.  Please pick up your prep supplies at the pharmacy within the next 1-3 days. If you use inhalers (even only as needed), please bring them with you on the day of your procedure.   Due to recent changes in healthcare laws, you may see the results of your imaging and laboratory studies on MyChart before your provider has had a chance to review them.  We understand that in some cases there may be results that are confusing or concerning to you. Not all laboratory results come back in the same time frame and the provider may be waiting for multiple results in order to interpret others.  Please give us 48 hours in order for your provider to thoroughly review all the results before contacting the office for clarification of your results.    I appreciate the opportunity to care for you. Carl Gessner, MD, FACG 

## 2020-08-18 NOTE — Progress Notes (Signed)
CJ EDGELL 75 y.o. 08-13-1945 [id number]  Assessment & Plan:   Encounter Diagnosis  Name Primary?  . History of colonic polyps Yes    Schedule surveillance colonoscopy.  The risks and benefits as well as alternatives of endoscopic procedure(s) have been discussed and reviewed. All questions answered. The patient agrees to proceed.  Patient will not be charged for this visit as he could have had a routine nursing visit to schedule this at no charge and we must have triaged this to an office visit and appropriately.  I appreciate the opportunity to care for this patient. CC: Chevis Pretty, FNP   Subjective:   Chief Complaint: Schedule colonoscopy for surveillance  HPI 75 year old white man with history of adenomatous colonic polyps, last colonoscopy 2016.  He had a hemorrhagic stroke plus COVID issues and has delayed recall.  No active GI complaints. Polypectomy pathology summary below 4 polyps removed October 2007. 3 recovered and all were adenomas. The largest of these 6 mm, 8 mm polyp was not recovered. 3 adenomas largest 1 cm 07/2011 Carlean Purl) 11/2014 3 diminutive adenomas - repeat colonoscopy 2019  Allergies  Allergen Reactions  . Crestor [Rosuvastatin Calcium] Other (See Comments)    "like to killed me." joint pain, unable to get OOB  . Benazepril Other (See Comments)    hyperkalemia   Current Meds  Medication Sig  . acetaminophen (TYLENOL) 650 MG CR tablet Take 650 mg by mouth daily as needed for pain.  [name] amLODipine (NORVASC) 10 MG tablet Take 1 tablet (10 mg total) by mouth daily.  [name] atorvastatin (LIPITOR) 10 MG tablet Take 1 tablet (10 mg total) by mouth daily.  . Calcium Carbonate-Vitamin D (CALCIUM-D PO) Take 1 tablet by mouth at bedtime.  . colchicine 0.6 MG tablet Take 0.6 mg by mouth daily as needed.   . ezetimibe (ZETIA) 10 MG tablet Take 1 tablet (10 mg total) by mouth daily.  . fluticasone (FLONASE) 50 MCG/ACT nasal spray SPRAY 2 SPRAYS INTO  EACH NOSTRIL EVERY DAY  . glimepiride (AMARYL) 4 MG tablet 1/2 tablet at supper time.  . hydrALAZINE (APRESOLINE) 25 MG tablet TAKE 1 TABLET BY MOUTH THREE TIMES A DAY  . losartan (COZAAR) 100 MG tablet Take 1 tablet (100 mg total) by mouth daily. (Patient taking differently: Take 50 mg by mouth daily.)  . ONETOUCH ULTRA test strip USE TO CHECK BLOOD SUGAR ONCE A DAY AS INSTRUCTED  . Semaglutide,0.25 or 0.5MG /DOS, (OZEMPIC, 0.25 OR 0.5 MG/DOSE,) 2 MG/1.5ML SOPN Inject 0.5 mg into the skin once a week.  [name] Specialty Vitamins Products (PROSTATE PO) Take 1 tablet by mouth 2 (two) times a day. Super Beta Prostate  . vitamin E 400 UNIT capsule Take 1 capsule (400 Units total) by mouth daily.   Past Medical History:  Diagnosis Date  . Adenomatous colon polyp 12/31/2005  . COVID-19 02/2020  . Diabetes mellitus   . Hypercholesterolemia   . Hypertension   . Intracranial hemorrhage ([place]) 2020   secondary to hypertensive crisis  . Renal artery stenosis (HCC)    Left renal artery 1-59% by dopplers 10/2018   Past Surgical History:  Procedure Laterality Date  . ANAL FISTULECTOMY  10/02/2011   Procedure: FISTULECTOMY ANAL;  Surgeon: Joyice Faster. Cornett, MD;  Location: [place];  Service: General;  Laterality: N/A;  excision perianal mass   . APPENDECTOMY    . COLONOSCOPY W/ POLYPECTOMY  12/31/2005   Dr. Silvano Rusk  . UMBILICAL HERNIA REPAIR  Social History   Social History Narrative   Retired Print production planner   Married 1 son, 2 daughters (son in [place])   grandkids and greatgrandkids   No EtOH, former smoker and no drugs   family history includes Cancer (age of onset: 29) in his father; Colon cancer (age of onset: 64) in his father; Colon polyps in his father; Diabetes (age of onset: 84) in his daughter; Heart attack in his maternal uncle; Heart disease in his maternal uncle, maternal uncle, and maternal uncle; Transient ischemic attack (age of onset: 19) in his  brother.   Review of Systems As per HPI  Objective:   Physical Exam BP (!) 142/72   Pulse 84   Ht 5\' 6"  (1.676 m)   Wt 156 lb (70.8 kg)   BMI 25.18 kg/m  NAD Lungs cta Cor NL

## 2020-08-19 ENCOUNTER — Other Ambulatory Visit: Payer: Self-pay

## 2020-08-19 ENCOUNTER — Ambulatory Visit
Admission: RE | Admit: 2020-08-19 | Discharge: 2020-08-19 | Disposition: A | Discharge status: Home / Self Care | Payer: PPO | Source: Ambulatory Visit | Attending: Neurosurgery | Admitting: Neurosurgery

## 2020-08-19 DIAGNOSIS — I671 Cerebral aneurysm, nonruptured: Secondary | ICD-10-CM

## 2020-08-19 DIAGNOSIS — I602 Nontraumatic subarachnoid hemorrhage from anterior communicating artery: Secondary | ICD-10-CM | POA: Diagnosis not present

## 2020-08-19 MED ORDER — IOPAMIDOL (ISOVUE-370) INJECTION 76%
50.0000 mL | Freq: Once | INTRAVENOUS | Status: AC | PRN
  Administered 2020-08-19: 50 mL via INTRAVENOUS

## 2020-08-23 ENCOUNTER — Other Ambulatory Visit: Payer: Self-pay

## 2020-08-23 ENCOUNTER — Encounter: Payer: Self-pay | Admitting: Cardiology

## 2020-08-23 ENCOUNTER — Ambulatory Visit: Payer: PPO | Admitting: Cardiology

## 2020-08-23 VITALS — BP 130/70 | HR 58 | Ht 66.0 in | Wt 156.6 lb

## 2020-08-23 DIAGNOSIS — N1831 Chronic kidney disease, stage 3a: Secondary | ICD-10-CM

## 2020-08-23 DIAGNOSIS — E782 Mixed hyperlipidemia: Secondary | ICD-10-CM | POA: Diagnosis not present

## 2020-08-23 DIAGNOSIS — I10 Essential (primary) hypertension: Secondary | ICD-10-CM

## 2020-08-23 DIAGNOSIS — E119 Type 2 diabetes mellitus without complications: Secondary | ICD-10-CM

## 2020-08-23 MED ORDER — EZETIMIBE 10 MG PO TABS: 10.0000 mg | ORAL_TABLET | Freq: Every day | ORAL | 3 refills | Status: DC

## 2020-08-23 MED ORDER — AMLODIPINE BESYLATE 10 MG PO TABS: 10.0000 mg | ORAL_TABLET | Freq: Every day | ORAL | 3 refills | Status: DC

## 2020-08-23 MED ORDER — ATORVASTATIN CALCIUM 10 MG PO TABS: 10.0000 mg | ORAL_TABLET | Freq: Every day | ORAL | 3 refills | Status: DC

## 2020-08-23 NOTE — Patient Instructions (Signed)

## 2020-08-23 NOTE — Progress Notes (Signed)
Cardiology Office Note:    Date:  08/23/2020   ID:  Peter York, DOB 23-Oct-1945, MRN [id number]  PCP:  Chevis Pretty, FNP  Cardiologist:  Fransico Him, MD    Referring MD: Chevis Pretty, *   Chief Complaint  Patient presents with  . Hypertension  . Hyperlipidemia    History of Present Illness:    Peter York is a 75 y.o. male with a hx of HTN, DM and dyslipidemia.  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 His ASA was stopped and he has essentially regained all his deficits after rehab. He is here today for followup and is doing well.  He denies any chest pain or pressure, SOB, DOE (except with extreme exertion), PND, orthopnea, LE edema, dizziness (unless he gets up too fast), palpitations or syncope. He is compliant with his meds and is tolerating meds with no SE.    Past Medical History:  Diagnosis Date  . Adenomatous colon polyp 12/31/2005  . COVID-19 02/2020  . Diabetes mellitus   . Hypercholesterolemia   . Hypertension   . 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 Renal artery stenosis (HCC)    Left renal artery 1-59% by dopplers 10/2018    Past Surgical History:  Procedure Laterality Date  . ANAL FISTULECTOMY  10/02/2011   Procedure: FISTULECTOMY ANAL;  Surgeon: Joyice Faster. Cornett, MD;  Location: [facility];  Service: General;  Laterality: N/A;  excision perianal mass   . APPENDECTOMY    . COLONOSCOPY W/ POLYPECTOMY  12/31/2005   Dr. Silvano Rusk  . UMBILICAL HERNIA REPAIR      Current Medications: Current Meds  Medication Sig  . acetaminophen (TYLENOL) 650 MG CR tablet Take 650 mg by mouth daily as needed for pain.  [name] amLODipine (NORVASC) 10 MG tablet Take 1 tablet (10 mg total) by mouth daily.  [name] atorvastatin (LIPITOR) 10 MG tablet Take 1 tablet (10 mg total) by mouth daily.  . Calcium Carbonate-Vitamin D (CALCIUM-D PO) Take 1 tablet by mouth at bedtime.  . colchicine 0.6 MG tablet Take  0.6 mg by mouth daily as needed.   . ezetimibe (ZETIA) 10 MG tablet Take 1 tablet (10 mg total) by mouth daily.  . fluticasone (FLONASE) 50 MCG/ACT nasal spray SPRAY 2 SPRAYS INTO EACH NOSTRIL EVERY DAY  . glimepiride (AMARYL) 4 MG tablet 1/2 tablet at supper time.  . hydrALAZINE (APRESOLINE) 25 MG tablet TAKE 1 TABLET BY MOUTH THREE TIMES A DAY  . losartan (COZAAR) 100 MG tablet Take 1 tablet (100 mg total) by mouth daily.  [name] test strip USE TO CHECK BLOOD SUGAR ONCE A DAY AS INSTRUCTED  . Semaglutide,0.25 or 0.5MG /DOS, (OZEMPIC, 0.25 OR 0.5 MG/DOSE,) 2 MG/1.5ML SOPN Inject 0.5 mg into the skin once a week.  [name] Specialty Vitamins Products (PROSTATE PO) Take 1 tablet by mouth 2 (two) times a day. Super Beta Prostate  . vitamin E 400 UNIT capsule Take 1 capsule (400 Units total) by mouth daily.     Allergies:   Crestor [rosuvastatin calcium] and Benazepril   Social History   Socioeconomic History  . Marital status: Married    Spouse name: [name]  . Number of children: 3  . Years of education: 49  . Highest education level: Some college, no degree  Occupational History  . Occupation: retired  Tobacco Use  . Smoking status: Former Smoker    Packs/day: 0.75  Years: 15.00    Pack years: 11.25    Types: Cigarettes    Quit date: 03/26/1978    Years since quitting: 42.4  . Smokeless tobacco: Never Used  Vaping Use  . Vaping Use: Never used  Substance and Sexual Activity  . Alcohol use: Yes    Comment: cocktail once a month  . Drug use: No  . Sexual activity: Yes  Other Topics Concern  . Not on file  Social History Narrative   Retired Print production planner   Married 1 son, 2 daughters (son in [place])   grandkids and greatgrandkids   No EtOH, former smoker and no drugs   Social Determinants of Radio broadcast assistant Strain: Not on file  Food Insecurity: Not on file  Transportation Needs: Not on file  Physical Activity: Not on file  Stress: Not on file  Social  Connections: Not on file     Family History: The patient's family history includes Cancer (age of onset: 58) in his father; Colon cancer (age of onset: 33) in his father; Colon polyps in his father; Diabetes (age of onset: 62) in his daughter; Heart attack in his maternal uncle; Heart disease in his maternal uncle, maternal uncle, and maternal uncle; Transient ischemic attack (age of onset: 21) in his brother.  ROS:   Please see the history of present illness.    ROS  All other systems reviewed and negative.   EKGs/Labs/Other Studies Reviewed:    The following studies were reviewed today: EKG  EKG:  EKG is  ordered today.  The ekg ordered today demonstrates Sinus bradycardia with early repolarization Recent Labs: 06/20/2020: ALT 21; BUN 24; Creatinine, Ser 1.72; Hemoglobin 12.2; Platelets 265; Potassium 5.3; Sodium 138   Recent Lipid Panel    Component Value Date/Time   CHOL 189 06/20/2020 0803   CHOL 165 10/02/2012 0834   TRIG 112 06/20/2020 0803   TRIG 115 03/09/2014 0818   TRIG 95 10/02/2012 0834   HDL 40 06/20/2020 0803   HDL 43 03/09/2014 0818   HDL 51 10/02/2012 0834   CHOLHDL 4.7 06/20/2020 0803   CHOLHDL 5.3 06/29/2018 1401   VLDL 35 06/29/2018 1401   LDLCALC 129 (H) 06/20/2020 0803   LDLCALC 223 (H) 09/10/2013 0905   LDLCALC 95 10/02/2012 0834    Physical Exam:    VS:  BP 130/70   Pulse (!) 58   Ht 5\' 6"  (1.676 m)   Wt 156 lb 9.6 oz (71 kg)   SpO2 99%   BMI 25.28 kg/m     Wt Readings from Last 3 Encounters:  08/23/20 156 lb 9.6 oz (71 kg)  08/18/20 156 lb (70.8 kg)  06/20/20 158 lb (71.7 kg)     GEN: Well nourished, well developed in no acute distress HEENT: Normal NECK: No JVD; No carotid bruits LYMPHATICS: No lymphadenopathy CARDIAC:RRR, no murmurs, rubs, gallops RESPIRATORY:  Clear to auscultation without rales, wheezing or rhonchi  ABDOMEN: Soft, non-tender, non-distended MUSCULOSKELETAL:  No edema; No deformity  SKIN: Warm and  dry NEUROLOGIC:  Alert and oriented x 3 PSYCHIATRIC:  Normal affect    ASSESSMENT:    1. Benign essential HTN   2. Diabetes mellitus type 2 in nonobese (HCC)   3. Mixed hyperlipidemia   4. Stage 3a chronic kidney disease ([place])    PLAN:    In order of problems listed above:  1.  Hypertension  -Bp is well controlled on exam today -Continue prescription drug management with amlodipine 10mg   daily, Hydralazine 25mg  TID and Losartan 50mg  daily -I have personally reviewed and interpreted outside labs performed by patient's PCP which showed SCr 1.72, K+ 5.3 in March 2022  2.  Type 2 DM  - this is followed by his PCP.  -I have personally reviewed and interpreted outside labs performed by patient's PCP which showed HbA1C was 6.1% in March 2022 -He will continue on Semglutide, Glimepiride 2mg  daily  3.  Hyperlipidemia  - his LDL goal is < 100.   -I have personally reviewed and interpreted outside labs performed by patient's PCP which showed LDL 129, HDL 40, TAGS 112 in March 2022 -he tells me that his labs in march were off because he was out of town for 2 weeks and forgot to take his meds -Continue prescription drug management with Zetia 10mg  daily and Atorvastatin 10mg  daily -I encouraged him to take his meds daily  4.  CKD stage 3a - this is followed by his PCP.     Medication Adjustments/Labs and Tests Ordered: Current medicines are reviewed at length with the patient today.  Concerns regarding medicines are outlined above.  Orders Placed This Encounter  Procedures  . EKG 12-Lead   No orders of the defined types were placed in this encounter.   Signed, Fransico Him, MD  08/23/2020 9:24 AM    [facility]

## 2020-08-23 NOTE — Addendum Note (Signed)
Addended by: Antonieta Iba on: 08/23/2020 09:30 AM   Modules accepted: Orders

## 2020-08-28 ENCOUNTER — Other Ambulatory Visit: Payer: Self-pay | Admitting: Nurse Practitioner

## 2020-08-28 DIAGNOSIS — I10 Essential (primary) hypertension: Secondary | ICD-10-CM

## 2020-08-31 ENCOUNTER — Other Ambulatory Visit: Payer: Self-pay | Admitting: Nurse Practitioner

## 2020-09-01 DIAGNOSIS — H10013 Acute follicular conjunctivitis, bilateral: Secondary | ICD-10-CM | POA: Diagnosis not present

## 2020-09-06 DIAGNOSIS — I10 Essential (primary) hypertension: Secondary | ICD-10-CM | POA: Diagnosis not present

## 2020-09-06 DIAGNOSIS — I671 Cerebral aneurysm, nonruptured: Secondary | ICD-10-CM | POA: Diagnosis not present

## 2020-11-10 ENCOUNTER — Ambulatory Visit (AMBULATORY_SURGERY_CENTER): Payer: PPO | Admitting: Internal Medicine

## 2020-11-10 ENCOUNTER — Encounter: Payer: Self-pay | Admitting: Internal Medicine

## 2020-11-10 ENCOUNTER — Other Ambulatory Visit: Payer: Self-pay

## 2020-11-10 VITALS — BP 108/56 | HR 59 | Temp 97.5°F | Resp 14 | Ht 66.0 in | Wt 156.0 lb

## 2020-11-10 DIAGNOSIS — I10 Essential (primary) hypertension: Secondary | ICD-10-CM | POA: Diagnosis not present

## 2020-11-10 DIAGNOSIS — D120 Benign neoplasm of cecum: Secondary | ICD-10-CM | POA: Diagnosis not present

## 2020-11-10 DIAGNOSIS — Z86010 Personal history of colonic polyps: Secondary | ICD-10-CM | POA: Diagnosis not present

## 2020-11-10 DIAGNOSIS — D124 Benign neoplasm of descending colon: Secondary | ICD-10-CM

## 2020-11-10 DIAGNOSIS — E119 Type 2 diabetes mellitus without complications: Secondary | ICD-10-CM | POA: Diagnosis not present

## 2020-11-10 DIAGNOSIS — N183 Chronic kidney disease, stage 3 unspecified: Secondary | ICD-10-CM | POA: Diagnosis not present

## 2020-11-10 DIAGNOSIS — E785 Hyperlipidemia, unspecified: Secondary | ICD-10-CM | POA: Diagnosis not present

## 2020-11-10 MED ORDER — SODIUM CHLORIDE 0.9 % IV SOLN: 500.0000 mL | Freq: Once | INTRAVENOUS | Status: DC

## 2020-11-10 NOTE — Progress Notes (Signed)
Report to PACU, RN, vss, BBS= Clear.  

## 2020-11-10 NOTE — Patient Instructions (Addendum)
Two tiny polyps removed.  You still have diverticulosis - thickened muscle rings and pouches in the colon wall. Please read the handout about this condition.  I will let you know results of polyps but tiny and am certain benign. Do not plan to recommend any routine repeat colonoscopy.  I appreciate the opportunity to care for you. Gatha Mayer, MD, FACG   IF YOU HAVE A CHANGE IN BOWEL HABITS, RECTAL BLEEDING OR OTHER GI CONCERN, CALL THE OFFICE TO LET us KNOW.  Handout given for diverticulosis and polyps.   YOU HAD AN ENDOSCOPIC PROCEDURE TODAY AT [facility]:   Refer to the procedure report that was given to you for any specific questions about what was found during the examination.  If the procedure report does not answer your questions, please call your gastroenterologist to clarify.  If you requested that your care partner not be given the details of your procedure findings, then the procedure report has been included in a sealed envelope for you to review at your convenience later.  YOU SHOULD EXPECT: Some feelings of bloating in the abdomen. Passage of more gas than usual.  Walking can help get rid of the air that was put into your GI tract during the procedure and reduce the bloating. If you had a lower endoscopy (such as a colonoscopy or flexible sigmoidoscopy) you may notice spotting of blood in your stool or on the toilet paper. If you underwent a bowel prep for your procedure, you may not have a normal bowel movement for a few days.  Please Note:  You might notice some irritation and congestion in your nose or some drainage.  This is from the oxygen used during your procedure.  There is no need for concern and it should clear up in a day or so.   SYMPTOMS TO REPORT IMMEDIATELY:  Following lower endoscopy (colonoscopy or flexible sigmoidoscopy):  Excessive amounts of blood in the stool  Significant tenderness or worsening of abdominal pains  Swelling of the  abdomen that is new, acute  Fever of 100F or higher  For urgent or emergent issues, a gastroenterologist can be reached at any hour by calling [phone]. Do not use MyChart messaging for urgent concerns.    DIET:  We do recommend a small meal at first, but then you may proceed to your regular diet.  Drink plenty of fluids but you should avoid alcoholic beverages for 24 hours.  ACTIVITY:  You should plan to take it easy for the rest of today and you should NOT DRIVE or use heavy machinery until tomorrow (because of the sedation medicines used during the test).    FOLLOW UP: Our staff will call the number listed on your records 48-72 hours following your procedure to check on you and address any questions or concerns that you may have regarding the information given to you following your procedure. If we do not reach you, we will leave a message.  We will attempt to reach you two times.  During this call, we will ask if you have developed any symptoms of COVID 19. If you develop any symptoms (ie: fever, flu-like symptoms, shortness of breath, cough etc.) before then, please call [phone].  If you test positive for Covid 19 in the 2 weeks post procedure, please call and report this information to [place].    If any biopsies were taken you will be contacted by phone or by letter within the next 1-3 weeks.  Please call us at [phone] if you have not heard about the biopsies in 3 weeks.    SIGNATURES/CONFIDENTIALITY: You and/or your care partner have signed paperwork which will be entered into your electronic medical record.  These signatures attest to the fact that that the information above on your After Visit Summary has been reviewed and is understood.  Full responsibility of the confidentiality of this discharge information lies with you and/or your care-partner.

## 2020-11-10 NOTE — Progress Notes (Signed)
Called to room to assist during endoscopic procedure.  Patient ID and intended procedure confirmed with present staff. Received instructions for my participation in the procedure from the performing physician.  

## 2020-11-10 NOTE — Op Note (Signed)
[facility] Patient Name: Peter York Procedure Date: 11/10/2020 8:30 AM MRN: [id number] Endoscopist: Gatha Mayer , MD Age: 75 Referring MD:  Date of Birth: 1946/02/22 Gender: Male Account #: [id number] Procedure:                Colonoscopy Indications:              Surveillance: Personal history of adenomatous                            polyps on last colonoscopy > 5 years ago Medicines:                Propofol per Anesthesia, Monitored Anesthesia Care Procedure:                Pre-Anesthesia Assessment:                           - Prior to the procedure, a History and Physical                            was performed, and patient medications and                            allergies were reviewed. The patient's tolerance of                            previous anesthesia was also reviewed. The risks                            and benefits of the procedure and the sedation                            options and risks were discussed with the patient.                            All questions were answered, and informed consent                            was obtained. Prior Anticoagulants: The patient has                            taken no previous anticoagulant or antiplatelet                            agents. ASA Grade Assessment: III - A patient with                            severe systemic disease. After reviewing the risks                            and benefits, the patient was deemed in                            satisfactory condition to undergo the procedure.  After obtaining informed consent, the colonoscope                            was passed under direct vision. Throughout the                            procedure, the patient's blood pressure, pulse, and                            oxygen saturations were monitored continuously. The                            CF [id number] was introduced through the anus                             and advanced to the the cecum, identified by                            appendiceal orifice and ileocecal valve. The                            colonoscopy was performed without difficulty. The                            patient tolerated the procedure well. The quality                            of the bowel preparation was good. The ileocecal                            valve, appendiceal orifice, and rectum were                            photographed. Scope In: 8:46:30 AM Scope Out: 8:58:37 AM Scope Withdrawal Time: 0 hours 8 minutes 17 seconds  Total Procedure Duration: 0 hours 12 minutes 7 seconds  Findings:                 The perianal and digital rectal examinations were                            normal. Pertinent negatives include normal prostate                            (size, shape, and consistency).                           Two sessile polyps were found in the descending                            colon and cecum. The polyps were diminutive in                            size. These polyps were removed with a cold snare.  Resection and retrieval were complete. Verification                            of patient identification for the specimen was done.                           Multiple diverticula were found in the sigmoid                            colon.                           The exam was otherwise without abnormality on                            direct and retroflexion views. Complications:            No immediate complications. Estimated Blood Loss:     Estimated blood loss: none. Impression:               - Two diminutive polyps in the descending colon and                            in the cecum, removed with a cold snare. Resected                            and retrieved.                           - Diverticulosis in the sigmoid colon.                           - The examination was otherwise normal on direct                             and retroflexion views.                           - Personal history of colonic polyps. 4 polyps                            removed October 2007. 3 recovered and all were                            adenomas. The largest of these 6 mm, 8 mm polyp was                            not recovered.                           3 adenomas largest 1 cm 07/2011 Carlean Purl)                           11/2014 3 diminutive adenomas Recommendation:           - Patient has a contact number available for  emergencies. The signs and symptoms of potential                            delayed complications were discussed with the                            patient. Return to normal activities tomorrow.                            Written discharge instructions were provided to the                            patient.                           - Resume previous diet.                           - Continue present medications.                           - Await pathology results.                           - No repeat colonoscopy due to age. Gatha Mayer, MD 11/10/2020 9:09:27 AM This report has been signed electronically.

## 2020-11-10 NOTE — Progress Notes (Signed)
[facility] History and Physical   Primary Care Physician:  Chevis Pretty, FNP   Reason for Procedure:   Hx colon polyps  Plan:    colonoscopy     HPI: Peter York is a 75 y.o. male here for a surveillance colonoscopy because of a history of colon polyps.   Past Medical History:  Diagnosis Date   Adenomatous colon polyp 12/31/2005   COVID-19 02/2020   Diabetes mellitus    Hypercholesterolemia    Hypertension    Intracranial hemorrhage ([place]) 2020   secondary to hypertensive crisis   Renal artery stenosis (HCC)    Left renal artery 1-59% by dopplers 10/2018    Past Surgical History:  Procedure Laterality Date   ANAL FISTULECTOMY  10/02/2011   Procedure: FISTULECTOMY ANAL;  Surgeon: Joyice Faster. Cornett, MD;  Location: [facility];  Service: General;  Laterality: N/A;  excision perianal mass    APPENDECTOMY     COLONOSCOPY W/ POLYPECTOMY  12/31/2005   Dr. Silvano Rusk   UMBILICAL HERNIA REPAIR      Prior to Admission medications   Medication Sig Start Date End Date Taking? Authorizing Provider  amLODipine (NORVASC) 10 MG tablet Take 1 tablet (10 mg total) by mouth daily. 08/23/20  Yes Turner, Eber Hong, MD  atorvastatin (LIPITOR) 10 MG tablet Take 1 tablet (10 mg total) by mouth daily. 08/23/20  Yes Turner, Eber Hong, MD  Calcium Carbonate-Vitamin D (CALCIUM-D PO) Take 1 tablet by mouth at bedtime.   Yes [provider]  ezetimibe (ZETIA) 10 MG tablet Take 1 tablet (10 mg total) by mouth daily. 08/23/20  Yes Turner, Eber Hong, MD  fluticasone (FLONASE) 50 MCG/ACT nasal spray SPRAY 2 SPRAYS INTO EACH NOSTRIL EVERY DAY 08/12/20  Yes Hassell Done, Mary-Margaret, FNP  glimepiride (AMARYL) 4 MG tablet 1/2 tablet at supper time. 06/20/20  Yes Hassell Done, Mary-Margaret, FNP  glucose blood (ONETOUCH ULTRA) test strip Check BS daily Dx E11.9 08/31/20  Yes Hassell Done, Mary-Margaret, FNP  hydrALAZINE (APRESOLINE) 25 MG tablet TAKE 1 TABLET BY MOUTH THREE TIMES A DAY  08/29/20  Yes Hassell Done, Mary-Margaret, FNP  losartan (COZAAR) 100 MG tablet Take 1 tablet (100 mg total) by mouth daily. 06/20/20  Yes Martin, Mary-Margaret, FNP  Semaglutide,0.25 or 0.'5MG'$ /DOS, (OZEMPIC, 0.25 OR 0.5 MG/DOSE,) 2 MG/1.5ML SOPN Inject 0.5 mg into the skin once a week. 06/20/20  Yes Hassell Done Mary-Margaret, FNP  Specialty Vitamins Products (PROSTATE PO) Take 1 tablet by mouth 2 (two) times a day. Super Beta Prostate   Yes [provider]  vitamin E 400 UNIT capsule Take 1 capsule (400 Units total) by mouth daily. 07/09/18  Yes Angiulli, Lavon Paganini, PA-C  acetaminophen (TYLENOL) 650 MG CR tablet Take 650 mg by mouth daily as needed for pain. Patient not taking: Reported on 11/10/2020    [provider]  colchicine 0.6 MG tablet Take 0.6 mg by mouth daily as needed.  Patient not taking: Reported on 11/10/2020    [provider]    Current Outpatient Medications  Medication Sig Dispense Refill   amLODipine (NORVASC) 10 MG tablet Take 1 tablet (10 mg total) by mouth daily. 90 tablet 3   atorvastatin (LIPITOR) 10 MG tablet Take 1 tablet (10 mg total) by mouth daily. 90 tablet 3   Calcium Carbonate-Vitamin D (CALCIUM-D PO) Take 1 tablet by mouth at bedtime.     ezetimibe (ZETIA) 10 MG tablet Take 1 tablet (10 mg total) by mouth daily. 90 tablet 3   fluticasone (  FLONASE) 50 MCG/ACT nasal spray SPRAY 2 SPRAYS INTO EACH NOSTRIL EVERY DAY 48 mL 1   glimepiride (AMARYL) 4 MG tablet 1/2 tablet at supper time. 90 tablet 1   glucose blood (ONETOUCH ULTRA) test strip Check BS daily Dx E11.9 100 strip 3   hydrALAZINE (APRESOLINE) 25 MG tablet TAKE 1 TABLET BY MOUTH THREE TIMES A DAY 270 tablet 0   losartan (COZAAR) 100 MG tablet Take 1 tablet (100 mg total) by mouth daily. 90 tablet 1   Semaglutide,0.25 or 0.'5MG'$ /DOS, (OZEMPIC, 0.25 OR 0.5 MG/DOSE,) 2 MG/1.5ML SOPN Inject 0.5 mg into the skin once a week. 6 mL 3   Specialty Vitamins Products (PROSTATE PO) Take 1 tablet by mouth 2  (two) times a day. Super Beta Prostate     vitamin E 400 UNIT capsule Take 1 capsule (400 Units total) by mouth daily. 30 capsule 3   acetaminophen (TYLENOL) 650 MG CR tablet Take 650 mg by mouth daily as needed for pain. (Patient not taking: Reported on 11/10/2020)     colchicine 0.6 MG tablet Take 0.6 mg by mouth daily as needed.  (Patient not taking: Reported on 11/10/2020)     Current Facility-Administered Medications  Medication Dose Route Frequency Provider Last Rate Last Admin   0.9 %  sodium chloride infusion  500 mL Intravenous Once Gatha Mayer, MD        Allergies as of 11/10/2020 - Review Complete 11/10/2020  Allergen Reaction Noted   Crestor [rosuvastatin calcium] Other (See Comments) 02/11/2016   Benazepril Other (See Comments) 08/15/2016    Family History  Problem Relation Age of Onset   Colon cancer Father 67   Colon polyps Father    Cancer Father 53       Colon   Heart attack Maternal Uncle        X 4   Heart disease Maternal Uncle    Heart disease Maternal Uncle    Heart disease Maternal Uncle    Diabetes Daughter 10       Type 1   Transient ischemic attack Brother 68    Social History   Socioeconomic History   Marital status: Married    Spouse name: [name]   Number of children: 3   Years of education: 14   Highest education level: Some college, no degree  Occupational History   Occupation: retired  Tobacco Use   Smoking status: Former    Packs/day: 0.75    Years: 15.00    Pack years: 11.25    Types: Cigarettes    Quit date: 03/26/1978    Years since quitting: 42.6   Smokeless tobacco: Never  Vaping Use   Vaping Use: Never used  Substance and Sexual Activity   Alcohol use: Yes    Comment: cocktail once a month   Drug use: No   Sexual activity: Yes  Other Topics Concern   Not on file  Social History Narrative   Retired Print production planner   Married 1 son, 2 daughters (son in [place])   grandkids and greatgrandkids   No EtOH, former smoker and no  drugs   Social Determinants of Radio broadcast assistant Strain: Not on file  Food Insecurity: Not on file  Transportation Needs: Not on file  Physical Activity: Not on file  Stress: Not on file  Social Connections: Not on file  Intimate Partner Violence: Not on file    Review of Systems:  All other review of systems negative except as mentioned  in the HPI.  Physical Exam: Vital signs BP (!) 93/57   Pulse 64   Temp (!) 97.5 F (36.4 C) (Skin)   Ht '5\' 6"'$  (1.676 m)   Wt 156 lb (70.8 kg)   SpO2 100%   BMI 25.18 kg/m   General:   Alert,  Well-developed, well-nourished, pleasant and cooperative in NAD Lungs:  Clear throughout to auscultation.   Heart:  Regular rate and rhythm; no murmurs, clicks, rubs,  or gallops. Abdomen:  Soft, nontender and nondistended. Normal bowel sounds.   Neuro/Psych:  Alert and cooperative. Normal mood and affect. A and O x 3   '@Bertrice Leder'$  Simonne Maffucci, MD, [facility] [phone] (pager) 11/10/2020 8:35 AM@

## 2020-11-10 NOTE — Progress Notes (Signed)
Pt's states no medical or surgical changes since previsit or office visit. VS assessed by C.W 

## 2020-11-13 ENCOUNTER — Other Ambulatory Visit: Payer: Self-pay | Admitting: Nurse Practitioner

## 2020-11-13 DIAGNOSIS — E119 Type 2 diabetes mellitus without complications: Secondary | ICD-10-CM

## 2020-11-14 ENCOUNTER — Telehealth: Payer: Self-pay | Admitting: *Deleted

## 2020-11-14 ENCOUNTER — Telehealth: Payer: Self-pay

## 2020-11-14 NOTE — Telephone Encounter (Signed)
Attempted f/u call back. No answer, VM full unable to leave message.

## 2020-11-14 NOTE — Telephone Encounter (Signed)
  Follow up Call-  Call back number 11/10/2020  Post procedure Call Back phone  # [phone]  Permission to leave phone message Yes  Some recent data might be hidden     Patient questions:  Do you have a fever, pain , or abdominal swelling? No. Pain Score  0 *  Have you tolerated food without any problems? Yes.    Have you been able to return to your normal activities? Yes.    Do you have any questions about your discharge instructions: Diet   No. Medications  No. Follow up visit  No.  Do you have questions or concerns about your Care? No.  Actions: * If pain score is 4 or above: No action needed, pain <4.  Have you developed a fever since your procedure? no  2.   Have you had an respiratory symptoms (SOB or cough) since your procedure? no  3.   Have you tested positive for COVID 19 since your procedure no  4.   Have you had any family members/close contacts diagnosed with the COVID 19 since your procedure?  no   If yes to any of these questions please route to Joylene John, RN and Joella Prince, RN

## 2020-11-22 ENCOUNTER — Encounter: Payer: Self-pay | Admitting: Internal Medicine

## 2020-11-26 ENCOUNTER — Other Ambulatory Visit: Payer: Self-pay | Admitting: Nurse Practitioner

## 2020-11-26 DIAGNOSIS — I10 Essential (primary) hypertension: Secondary | ICD-10-CM

## 2020-12-06 DIAGNOSIS — H2513 Age-related nuclear cataract, bilateral: Secondary | ICD-10-CM | POA: Diagnosis not present

## 2020-12-06 DIAGNOSIS — H40033 Anatomical narrow angle, bilateral: Secondary | ICD-10-CM | POA: Diagnosis not present

## 2020-12-22 ENCOUNTER — Other Ambulatory Visit: Payer: Self-pay

## 2020-12-22 ENCOUNTER — Ambulatory Visit (INDEPENDENT_AMBULATORY_CARE_PROVIDER_SITE_OTHER): Payer: PPO | Admitting: Nurse Practitioner

## 2020-12-22 ENCOUNTER — Encounter: Payer: Self-pay | Admitting: Nurse Practitioner

## 2020-12-22 VITALS — BP 130/80 | HR 54 | Temp 96.8°F | Resp 20 | Ht 66.0 in | Wt 155.0 lb

## 2020-12-22 DIAGNOSIS — E119 Type 2 diabetes mellitus without complications: Secondary | ICD-10-CM

## 2020-12-22 DIAGNOSIS — I10 Essential (primary) hypertension: Secondary | ICD-10-CM

## 2020-12-22 DIAGNOSIS — M810 Age-related osteoporosis without current pathological fracture: Secondary | ICD-10-CM

## 2020-12-22 DIAGNOSIS — E782 Mixed hyperlipidemia: Secondary | ICD-10-CM

## 2020-12-22 DIAGNOSIS — I6930 Unspecified sequelae of cerebral infarction: Secondary | ICD-10-CM

## 2020-12-22 LAB — CMP14+EGFR
ALT: 26 IU/L (ref 0–44)
AST: 22 IU/L (ref 0–40)
Albumin/Globulin Ratio: 1.6 (ref 1.2–2.2)
Albumin: 4.6 g/dL (ref 3.7–4.7)
Alkaline Phosphatase: 75 IU/L (ref 44–121)
BUN/Creatinine Ratio: 13 (ref 10–24)
BUN: 23 mg/dL (ref 8–27)
Bilirubin Total: 0.4 mg/dL (ref 0.0–1.2)
CO2: 22 mmol/L (ref 20–29)
Calcium: 9.4 mg/dL (ref 8.6–10.2)
Chloride: 105 mmol/L (ref 96–106)
Creatinine, Ser: 1.79 mg/dL — ABNORMAL HIGH (ref 0.76–1.27)
Globulin, Total: 2.8 g/dL (ref 1.5–4.5)
Glucose: 95 mg/dL (ref 70–99)
Potassium: 5.1 mmol/L (ref 3.5–5.2)
Sodium: 141 mmol/L (ref 134–144)
Total Protein: 7.4 g/dL (ref 6.0–8.5)
eGFR: 39 mL/min/{1.73_m2} — ABNORMAL LOW (ref >59)

## 2020-12-22 LAB — CBC WITH DIFFERENTIAL/PLATELET
Basophils Absolute: 0.1 10*3/uL (ref 0.0–0.2)
Basos: 1 %
EOS (ABSOLUTE): 0.4 10*3/uL (ref 0.0–0.4)
Eos: 7 %
Hematocrit: 35.6 % — ABNORMAL LOW (ref 37.5–51.0)
Hemoglobin: 12.1 g/dL — ABNORMAL LOW (ref 13.0–17.7)
Immature Grans (Abs): 0 10*3/uL (ref 0.0–0.1)
Immature Granulocytes: 0 %
Lymphocytes Absolute: 1.1 10*3/uL (ref 0.7–3.1)
Lymphs: 20 %
MCH: 30.8 pg (ref 26.6–33.0)
MCHC: 34 g/dL (ref 31.5–35.7)
MCV: 91 fL (ref 79–97)
Monocytes Absolute: 0.4 10*3/uL (ref 0.1–0.9)
Monocytes: 7 %
Neutrophils Absolute: 3.6 10*3/uL (ref 1.4–7.0)
Neutrophils: 65 %
Platelets: 286 10*3/uL (ref 150–450)
RBC: 3.93 x10E6/uL — ABNORMAL LOW (ref 4.14–5.80)
RDW: 11.9 % (ref 11.6–15.4)
WBC: 5.6 10*3/uL (ref 3.4–10.8)

## 2020-12-22 LAB — LIPID PANEL
Chol/HDL Ratio: 3.4 ratio (ref 0.0–5.0)
Cholesterol, Total: 125 mg/dL (ref 100–199)
HDL: 37 mg/dL — ABNORMAL LOW (ref >39)
LDL Chol Calc (NIH): 73 mg/dL (ref 0–99)
Triglycerides: 75 mg/dL (ref 0–149)
VLDL Cholesterol Cal: 15 mg/dL (ref 5–40)

## 2020-12-22 LAB — BAYER DCA HB A1C WAIVED: HB A1C (BAYER DCA - WAIVED): 5.8 % — ABNORMAL HIGH (ref 4.8–5.6)

## 2020-12-22 MED ORDER — OZEMPIC (0.25 OR 0.5 MG/DOSE) 2 MG/1.5ML ~~LOC~~ SOPN: 0.5000 mg | PEN_INJECTOR | SUBCUTANEOUS | 3 refills | Status: DC

## 2020-12-22 MED ORDER — HYDRALAZINE HCL 25 MG PO TABS: 25.0000 mg | ORAL_TABLET | Freq: Three times a day (TID) | ORAL | 1 refills | Status: DC

## 2020-12-22 MED ORDER — AMLODIPINE BESYLATE 10 MG PO TABS: 10.0000 mg | ORAL_TABLET | Freq: Every day | ORAL | 3 refills | Status: DC

## 2020-12-22 MED ORDER — LOSARTAN POTASSIUM 100 MG PO TABS: 100.0000 mg | ORAL_TABLET | Freq: Every day | ORAL | 1 refills | Status: DC

## 2020-12-22 MED ORDER — EZETIMIBE 10 MG PO TABS: 10.0000 mg | ORAL_TABLET | Freq: Every day | ORAL | 3 refills | Status: DC

## 2020-12-22 MED ORDER — ATORVASTATIN CALCIUM 10 MG PO TABS: 10.0000 mg | ORAL_TABLET | Freq: Every day | ORAL | 3 refills | Status: DC

## 2020-12-22 NOTE — Progress Notes (Signed)
Subjective:    Patient ID: Peter York, male    DOB: [birth year], 75 y.o.   MRN: [id number]  Chief Complaint: Medical Management of Chronic Issues    HPI:  1. Type 2 diabetes mellitus without complication, without long-term current use of insulin (HCC) Fasting blood sugars are running around 100-120. He denies any low blood sugars. Lab Results  Component Value Date   HGBA1C 6.1 06/20/2020     2. Mixed hyperlipidemia Does watch diet and stays very active. Lab Results  Component Value Date   CHOL 189 06/20/2020   HDL 40 06/20/2020   LDLCALC 129 (H) 06/20/2020   TRIG 112 06/20/2020   CHOLHDL 4.7 06/20/2020     3. Primary hypertension No c/o chest pain, sob or headache. Does  check blood pressure at home and it is always below 892 systolic. BP Readings from Last 3 Encounters:  12/22/20 (!) 151/82  11/10/20 (!) 108/56  08/23/20 130/70     4. Osteoporosis of vertebra Last dexascan was done 05/12/19. T score was -1.9. he rides horses a lot.  5. Late effect of cerebrovascular accident (CVA) No residual effects.    Outpatient Encounter Medications as of 12/22/2020  Medication Sig   acetaminophen (TYLENOL) 650 MG CR tablet Take 650 mg by mouth daily as needed for pain.   amLODipine (NORVASC) 10 MG tablet Take 1 tablet (10 mg total) by mouth daily.   atorvastatin (LIPITOR) 10 MG tablet Take 1 tablet (10 mg total) by mouth daily.   Calcium Carbonate-Vitamin D (CALCIUM-D PO) Take 1 tablet by mouth at bedtime.   colchicine 0.6 MG tablet Take 0.6 mg by mouth daily as needed.   ezetimibe (ZETIA) 10 MG tablet Take 1 tablet (10 mg total) by mouth daily.   fluticasone (FLONASE) 50 MCG/ACT nasal spray SPRAY 2 SPRAYS INTO EACH NOSTRIL EVERY DAY   glimepiride (AMARYL) 4 MG tablet TAKE 1 TABLET BY MOUTH DAILY WITH BREAKFAST   glucose blood (ONETOUCH ULTRA) test strip Check BS daily Dx E11.9   hydrALAZINE (APRESOLINE) 25 MG tablet TAKE 1 TABLET BY MOUTH THREE TIMES A DAY   losartan  (COZAAR) 100 MG tablet Take 1 tablet (100 mg total) by mouth daily.   Semaglutide,0.25 or 0.5MG/DOS, (OZEMPIC, 0.25 OR 0.5 MG/DOSE,) 2 MG/1.5ML SOPN Inject 0.5 mg into the skin once a week.   Specialty Vitamins Products (PROSTATE PO) Take 1 tablet by mouth 2 (two) times a day. Super Beta Prostate   vitamin E 400 UNIT capsule Take 1 capsule (400 Units total) by mouth daily.   No facility-administered encounter medications on file as of 12/22/2020.    Past Surgical History:  Procedure Laterality Date   ANAL FISTULECTOMY  10/02/2011   Procedure: FISTULECTOMY ANAL;  Surgeon: Joyice Faster. Cornett, MD;  Location: [facility];  Service: General;  Laterality: N/A;  excision perianal mass    APPENDECTOMY     COLONOSCOPY W/ POLYPECTOMY  12/31/2005   Dr. Silvano Rusk   UMBILICAL HERNIA REPAIR      Family History  Problem Relation Age of Onset   Colon cancer Father 56   Colon polyps Father    Cancer Father 67       Colon   Heart attack Maternal Uncle        X 4   Heart disease Maternal Uncle    Heart disease Maternal Uncle    Heart disease Maternal Uncle    Diabetes Daughter 24       Type  1   Transient ischemic attack Brother 30    New complaints: None today  Social history: Lives with wife and daughter  Controlled substance contract: n/a     Review of Systems  Constitutional:  Negative for diaphoresis.  Eyes:  Negative for pain.  Respiratory:  Negative for shortness of breath.   Cardiovascular:  Negative for chest pain, palpitations and leg swelling.  Gastrointestinal:  Negative for abdominal pain.  Endocrine: Negative for polydipsia.  Skin:  Negative for rash.  Neurological:  Negative for dizziness, weakness and headaches.  Hematological:  Does not bruise/bleed easily.  All other systems reviewed and are negative.     Objective:   Physical Exam Vitals and nursing note reviewed.  Constitutional:      Appearance: Normal appearance. He is well-developed.   HENT:     Head: Normocephalic.     Nose: Nose normal.  Eyes:     Pupils: Pupils are equal, round, and reactive to light.  Neck:     Thyroid: No thyroid mass or thyromegaly.     Vascular: No carotid bruit or JVD.     Trachea: Phonation normal.  Cardiovascular:     Rate and Rhythm: Normal rate and regular rhythm.  Pulmonary:     Effort: Pulmonary effort is normal. No respiratory distress.     Breath sounds: Normal breath sounds.  Abdominal:     General: Bowel sounds are normal.     Palpations: Abdomen is soft.     Tenderness: There is no abdominal tenderness.  Musculoskeletal:        General: Normal range of motion.     Cervical back: Normal range of motion and neck supple.  Lymphadenopathy:     Cervical: No cervical adenopathy.  Skin:    General: Skin is warm and dry.  Neurological:     Mental Status: He is alert and oriented to person, place, and time.  Psychiatric:        Behavior: Behavior normal.        Thought Content: Thought content normal.        Judgment: Judgment normal.    BP 130/80 Comment: at home  Pulse (!) 54   Temp (!) 96.8 F (36 C) (Temporal)   Resp 20   Ht 5' 6"  (1.676 m)   Wt 155 lb (70.3 kg)   SpO2 98%   BMI 25.02 kg/m    Hgba1c 5.8%    Assessment & Plan:  Peter York comes in today with chief complaint of Medical Management of Chronic Issues   Diagnosis and orders addressed:  1. Type 2 diabetes mellitus without complication, without long-term current use of insulin (HCC) Continue to check blood sugars daily Hold amaryl- let me know if blood sugars go above 140 consistently - Bayer DCA Hb A1c Waived - Semaglutide,0.25 or 0.5MG/DOS, (OZEMPIC, 0.25 OR 0.5 MG/DOSE,) 2 MG/1.5ML SOPN; Inject 0.5 mg into the skin once a week.  Dispense: 6 mL; Refill: 3  2. Mixed hyperlipidemia Low fat diet - Lipid panel - ezetimibe (ZETIA) 10 MG tablet; Take 1 tablet (10 mg total) by mouth daily.  Dispense: 90 tablet; Refill: 3 - atorvastatin (LIPITOR)  10 MG tablet; Take 1 tablet (10 mg total) by mouth daily.  Dispense: 90 tablet; Refill: 3 - amLODipine (NORVASC) 10 MG tablet; Take 1 tablet (10 mg total) by mouth daily.  Dispense: 90 tablet; Refill: 3 - losartan (COZAAR) 100 MG tablet; Take 1 tablet (100 mg total) by mouth daily.  Dispense:  90 tablet; Refill: 1  3. Primary hypertension Low sodium diet - CBC with Differential/Platelet - CMP14+EGFR  4.  Osteoporosis of vertebra Weight bearing exercise  5. Late effect of cerebrovascular accident (CVA) - hydrALAZINE (APRESOLINE) 25 MG tablet; Take 1 tablet (25 mg total) by mouth 3 (three) times daily.  Dispense: 90 tablet; Refill: 1   Labs pending Health Maintenance reviewed Diet and exercise encouraged  Follow up plan: 6 months   Mary-Margaret Hassell Done, FNP

## 2020-12-22 NOTE — Patient Instructions (Signed)

## 2020-12-24 ENCOUNTER — Other Ambulatory Visit: Payer: Self-pay | Admitting: Nurse Practitioner

## 2020-12-24 DIAGNOSIS — I10 Essential (primary) hypertension: Secondary | ICD-10-CM

## 2021-01-02 DIAGNOSIS — R972 Elevated prostate specific antigen [PSA]: Secondary | ICD-10-CM | POA: Diagnosis not present

## 2021-01-19 ENCOUNTER — Other Ambulatory Visit: Payer: Self-pay | Admitting: Nurse Practitioner

## 2021-01-19 DIAGNOSIS — I10 Essential (primary) hypertension: Secondary | ICD-10-CM

## 2021-01-30 ENCOUNTER — Ambulatory Visit (INDEPENDENT_AMBULATORY_CARE_PROVIDER_SITE_OTHER): Payer: PPO | Admitting: Nurse Practitioner

## 2021-01-30 ENCOUNTER — Other Ambulatory Visit: Payer: Self-pay

## 2021-01-30 ENCOUNTER — Ambulatory Visit (INDEPENDENT_AMBULATORY_CARE_PROVIDER_SITE_OTHER): Payer: PPO

## 2021-01-30 ENCOUNTER — Encounter: Payer: Self-pay | Admitting: Nurse Practitioner

## 2021-01-30 VITALS — BP 120/69 | HR 67 | Temp 98.0°F | Resp 20 | Ht 66.0 in | Wt 149.0 lb

## 2021-01-30 DIAGNOSIS — G8929 Other chronic pain: Secondary | ICD-10-CM | POA: Diagnosis not present

## 2021-01-30 DIAGNOSIS — M545 Low back pain, unspecified: Secondary | ICD-10-CM

## 2021-01-30 MED ORDER — CYCLOBENZAPRINE HCL 5 MG PO TABS
5.0000 mg | ORAL_TABLET | Freq: Three times a day (TID) | ORAL | 1 refills | Status: DC | PRN
Start: 2021-01-30 — End: 2022-01-25

## 2021-01-30 NOTE — Patient Instructions (Signed)
Spinal Compression Fracture A spinal compression fracture is a collapse of the bones that form the spine (vertebrae). With this type of fracture, the vertebrae become pushed (compressed) into a wedge shape. Most compression fractures happen in the middle or lower part of the spine. What are the causes? This condition may be caused by: Thinning and loss of density in the bones (osteoporosis). This is the most common cause. A fall. A car or motorcycle accident. Cancer. Trauma, such as a heavy, direct hit to the head or back. What increases the risk? You are more likely to develop this condition if: You are 60 years of age or older. You have osteoporosis. You have certain types of cancer, including: Multiple myeloma. Lymphoma. Prostate cancer. Lung cancer. Breast cancer. What are the signs or symptoms? Symptoms of this condition include: Severe pain with simple movements such as coughing or sneezing. Pain that gets worse over time. Pain that is worse when you stand, walk, sit, or bend. Sudden pain that is so bad that it is hard for you to move. Bending or humping of the spine. Gradual loss of height. Numbness, tingling, or weakness in the back and legs. Trouble walking. Your symptoms will depend on the cause of the fracture and how quickly it develops. How is this diagnosed? This condition may be diagnosed based on symptoms, medical history, and a physical exam. During the physical exam, your health care provider may tap along the length of your spine to check for tenderness. Tests may be done to confirm the diagnosis. They may include: A bone mineral density test to check for osteoporosis. Imaging tests, such as a spine X-ray, CT scan, or MRI. How is this treated? Treatment depends on the cause and severity of the condition. Some fractures may heal on their own with supportive care. Treatment may include: Pain medicine. Rest. A back brace. Physical therapy exercises. Medicine to  strengthen bone. Calcium and vitamin D supplements. Fractures that cause the back to become misshapen, cause nerve pain or weakness, or do not respond to other treatment may be treated with surgery. This may include: Vertebroplasty. Bone cement is injected into the collapsed vertebrae to stabilize them. Balloon kyphoplasty. The collapsed vertebrae are expanded with a balloon and then bone cement is injected into them. Spinal fusion. The collapsed vertebrae are connected (fused) to normal vertebrae. Follow these instructions at home: Medicines Take over-the-counter and prescription medicines only as told by your health care provider. Ask your health care provider if the medicine prescribed to you: Requires you to avoid driving or using machinery. Can cause constipation. You may need to take these actions to prevent or treat constipation: Drink enough fluid to keep your urine pale yellow. Take over-the-counter or prescription medicines. Eat foods that are high in fiber, such as beans, whole grains, and fresh fruits and vegetables. Limit foods that are high in fat and processed sugars, such as fried or sweet foods. If you have a brace: Wear the brace as told by your health care provider. Remove it only as told by your health care provider. Loosen the brace if your fingers or toes tingle, become numb, or turn cold and blue. Keep the brace clean. If the brace is not waterproof: Do not let it get wet. Cover it with a watertight covering when you take a bath or a shower. Managing pain, stiffness, and swelling  If directed, put ice on the injured area. To do this: If you have a removable brace, remove it as told   by your health care provider. Put ice in a plastic bag. Place a towel between your skin and the bag. Leave the ice on for 20 minutes, 2-3 times a day. Remove the ice if your skin turns bright red. This is very important. If you cannot feel pain, heat, or cold, you have a greater risk of  damage to the area. Activity Rest as told by your health care provider. Avoid sitting for a long time without moving. Get up to take short walks every 1-2 hours. This is important to improve blood flow and breathing. Ask for help if you feel weak or unsteady. Return to your normal activities as told by your health care provider. Ask what activities are safe for you. Do physical therapy exercises to improve movement and strength in your back, as recommended by your health care provider. Exercise regularly as directed by your health care provider. General instructions  Do not drink alcohol. Alcohol can interfere with your treatment. Do not use any products that contain nicotine or tobacco, such as cigarettes, e-cigarettes, and chewing tobacco. These can delay bone healing. If you need help quitting, ask your health care provider. Keep all follow-up visits. This is important. It can help to prevent permanent injury, disability, and long-lasting (chronic) pain. Contact a health care provider if: You have a fever. Your pain medicine is not helping. Your pain does not get better over time. You cannot return to your normal activities as planned or expected. Get help right away if: Your pain is very bad and it suddenly gets worse. You are unable to move any body part (paralysis) that is below the level of your injury. You have numbness, tingling, or weakness in any body part that is below the level of your injury. You cannot control your bladder or bowels. Summary A spinal compression fracture is a collapse of the bones that form the spine (vertebrae). With this type of fracture, the vertebrae become pushed (compressed) into a wedge shape. Your symptoms and treatment will depend on the cause and severity of the fracture and how quickly it develops. Some fractures may heal on their own with supportive care. Fractures that cause the back to become misshapen, cause nerve pain or weakness, or do not  respond to other treatment may be treated with surgery. This information is not intended to replace advice given to you by your health care provider. Make sure you discuss any questions you have with your health care provider. Document Revised: 07/01/2019 Document Reviewed: 07/01/2019 Elsevier Patient Education  2022 [employer]  

## 2021-01-30 NOTE — Progress Notes (Signed)
   Subjective:    Patient ID: Peter York, male    DOB: [birth year], 75 y.o.   MRN: [id number]   Chief Complaint: Back Pain (Low back pain/)   HPI Patient is in today c/o low back pain. He says he had an injury years ago and had some issues in lumbar area. Wants xray repeated to make sure better. He was riding his horse in a filed trial and back starting hurting. Rate spain5-6/10. Pain is intermittent. He says that pain feels like muscles are "getting in a ball". Pain is mainly on left side. Pain stays in one spot and does not radiate. He has not been takig any thing for pain.    Review of Systems  Constitutional:  Negative for diaphoresis.  Eyes:  Negative for pain.  Respiratory:  Negative for shortness of breath.   Cardiovascular:  Negative for chest pain, palpitations and leg swelling.  Gastrointestinal:  Negative for abdominal pain.  Endocrine: Negative for polydipsia.  Musculoskeletal:  Positive for back pain.  Skin:  Negative for rash.  Neurological:  Negative for dizziness, weakness and headaches.  Hematological:  Does not bruise/bleed easily.  All other systems reviewed and are negative.     Objective:   Physical Exam Nursing note reviewed.  Constitutional:      Appearance: Normal appearance.  Cardiovascular:     Rate and Rhythm: Regular rhythm.     Heart sounds: Normal heart sounds.  Pulmonary:     Effort: Pulmonary effort is normal.     Breath sounds: Normal breath sounds.  Musculoskeletal:     Comments: FROM of lumbar spine with pain sitting straight up (-) SLR bil Motor strength and sensation distally intact  Neurological:     Mental Status: He is alert.     Lumbar spine xray- possible fracture at L3-4- will wait on radiology report-Preliminary reading by Ronnald Collum, FNP  [facility]      Assessment & Plan:  Peter York in today with chief complaint of Back Pain (Low back pain/)   1. Chronic midline low back pain without sciatica Moist heat Rest Will  call patient with radiology report - DG Lumbar Spine 2-3 Views Meds ordered this encounter  Medications   cyclobenzaprine (FLEXERIL) 5 MG tablet    Sig: Take 1 tablet (5 mg total) by mouth 3 (three) times daily as needed for muscle spasms.    Dispense:  30 tablet    Refill:  1    Order Specific Question:   Supervising Provider    Answer:   Caryl Pina A [[id number]]      The above assessment and management plan was discussed with the patient. The patient verbalized understanding of and has agreed to the management plan. Patient is aware to call the clinic if symptoms persist or worsen. Patient is aware when to return to the clinic for a follow-up visit. Patient educated on when it is appropriate to go to the emergency department.   Mary-Margaret Hassell Done, FNP

## 2021-02-08 ENCOUNTER — Ambulatory Visit (INDEPENDENT_AMBULATORY_CARE_PROVIDER_SITE_OTHER): Payer: PPO

## 2021-02-08 VITALS — BP 130/80 | Ht 66.0 in | Wt 149.0 lb

## 2021-02-08 DIAGNOSIS — Z0000 Encounter for general adult medical examination without abnormal findings: Secondary | ICD-10-CM

## 2021-02-08 NOTE — Progress Notes (Signed)
Subjective:   Peter York is a 75 y.o. male who presents for Medicare Annual/Subsequent preventive examination.  Virtual Visit via Telephone Note  I connected with  Peter York on 02/08/21 at  2:00 PM EST by telephone and verified that I am speaking with the correct person using two identifiers.  Location: Patient: Home Provider: [facility] Persons participating in the virtual visit: patient/Nurse Health Advisor   I discussed the limitations, risks, security and privacy concerns of performing an evaluation and management service by telephone and the availability of in person appointments. The patient expressed understanding and agreed to proceed.  Interactive audio and video telecommunications were attempted between this nurse and patient, however failed, due to patient having technical difficulties OR patient did not have access to video capability.  We continued and completed visit with audio only.  Some vital signs may be absent or patient reported.   Liyla Radliff E Elison Worrel, LPN    Review of Systems     Cardiac Risk Factors include: advanced age (>28men, >54 women);diabetes mellitus;dyslipidemia;hypertension;male gender;Other (see comment), Risk factor comments: hx of CVA, arteriosclerosis     Objective:    Today's Vitals   02/08/21 1403  BP: 130/80  Weight: 149 lb (67.6 kg)  Height: 5\' 6"  (1.676 m)  PainSc: 3    Body mass index is 24.05 kg/m.  Advanced Directives 02/08/2021 02/08/2020 02/05/2019 07/01/2018 06/29/2018 07/22/2017 07/19/2016  Does Patient Have a Medical Advance Directive? No No No No No No No  Type of Advance Directive - - - - - - -  Would patient like information on creating a medical advance directive? No - Patient declined No - Patient declined No - Patient declined No - Patient declined - No - Patient declined Yes (MAU/Ambulatory/Procedural Areas - Information given)  Pre-existing out of facility DNR order (yellow form or pink MOST form) - - - - - - -    Current  Medications (verified) Outpatient Encounter Medications as of 02/08/2021  Medication Sig   acetaminophen (TYLENOL) 650 MG CR tablet Take 650 mg by mouth daily as needed for pain.   amLODipine (NORVASC) 10 MG tablet Take 1 tablet (10 mg total) by mouth daily.   atorvastatin (LIPITOR) 10 MG tablet Take 1 tablet (10 mg total) by mouth daily.   Calcium Carbonate-Vitamin D (CALCIUM-D PO) Take 1 tablet by mouth at bedtime.   colchicine 0.6 MG tablet Take 0.6 mg by mouth daily as needed.   cromolyn (OPTICROM) 4 % ophthalmic solution 1 drop 2 (two) times daily.   cyclobenzaprine (FLEXERIL) 5 MG tablet Take 1 tablet (5 mg total) by mouth 3 (three) times daily as needed for muscle spasms.   ezetimibe (ZETIA) 10 MG tablet Take 1 tablet (10 mg total) by mouth daily.   fluticasone (FLONASE) 50 MCG/ACT nasal spray SPRAY 2 SPRAYS INTO EACH NOSTRIL EVERY DAY   glucose blood (ONETOUCH ULTRA) test strip Check BS daily Dx E11.9   hydrALAZINE (APRESOLINE) 25 MG tablet TAKE 1 TABLET BY MOUTH THREE TIMES A DAY   losartan (COZAAR) 100 MG tablet Take 1 tablet (100 mg total) by mouth daily.   Semaglutide,0.25 or 0.5MG /DOS, (OZEMPIC, 0.25 OR 0.5 MG/DOSE,) 2 MG/1.5ML SOPN Inject 0.5 mg into the skin once a week.   Specialty Vitamins Products (PROSTATE PO) Take 1 tablet by mouth 2 (two) times a day. Super Beta Prostate   vitamin E 400 UNIT capsule Take 1 capsule (400 Units total) by mouth daily.   No facility-administered encounter medications on  file as of 02/08/2021.    Allergies (verified) Crestor [rosuvastatin calcium] and Benazepril   History: Past Medical History:  Diagnosis Date   Adenomatous colon polyp 12/31/2005   COVID-19 02/2020   Diabetes mellitus    Hypercholesterolemia    Hypertension    Intracranial hemorrhage ([place]) 2020   secondary to hypertensive crisis   Renal artery stenosis (HCC)    Left renal artery 1-59% by dopplers 10/2018   Past Surgical History:  Procedure Laterality Date   ANAL  FISTULECTOMY  10/02/2011   Procedure: FISTULECTOMY ANAL;  Surgeon: Joyice Faster. Cornett, MD;  Location: [facility];  Service: General;  Laterality: N/A;  excision perianal mass    APPENDECTOMY     COLONOSCOPY W/ POLYPECTOMY  12/31/2005   Dr. Silvano Rusk   UMBILICAL HERNIA REPAIR     Family History  Problem Relation Age of Onset   Colon cancer Father 6   Colon polyps Father    Cancer Father 79       Colon   Heart attack Maternal Uncle        X 4   Heart disease Maternal Uncle    Heart disease Maternal Uncle    Heart disease Maternal Uncle    Diabetes Daughter 63       Type 1   Transient ischemic attack Brother 9   Social History   Socioeconomic History   Marital status: Married    Spouse name: [name]   Number of children: 3   Years of education: 14   Highest education level: Some college, no degree  Occupational History   Occupation: retired  Tobacco Use   Smoking status: Former    Packs/day: 0.75    Years: 15.00    Pack years: 11.25    Types: Cigarettes    Quit date: 03/26/1978    Years since quitting: 42.9   Smokeless tobacco: Never  Vaping Use   Vaping Use: Never used  Substance and Sexual Activity   Alcohol use: Yes    Comment: cocktail once a month   Drug use: No   Sexual activity: Yes  Other Topics Concern   Not on file  Social History Narrative   Very active and independent - trains bird dogs, raises horses, etc   Retired Print production planner   Married 1 son, 2 daughters (son in [place])   grandkids and greatgrandkids   No EtOH, former smoker and no drugs   Social Determinants of Radio broadcast assistant Strain: Low Risk    Difficulty of Paying Living Expenses: Not hard at all  Food Insecurity: No Food Insecurity   Worried About Charity fundraiser in the Last Year: Never true   Arboriculturist in the Last Year: Never true  Transportation Needs: No Transportation Needs   Lack of Transportation (Medical): No   Lack of Transportation  (Non-Medical): No  Physical Activity: Sufficiently Active   Days of Exercise per Week: 7 days   Minutes of Exercise per Session: 90 min  Stress: No Stress Concern Present   Feeling of Stress : Not at all  Social Connections: Moderately Integrated   Frequency of Communication with Friends and Family: More than three times a week   Frequency of Social Gatherings with Friends and Family: More than three times a week   Attends Religious Services: Never   Marine scientist or Organizations: Yes   Attends Music therapist: More than 4 times per year  Marital Status: Married    Tobacco Counseling Counseling given: Not Answered   Clinical Intake:  Pre-visit preparation completed: Yes  Pain : 0-10 Pain Score: 3  Pain Type: Chronic pain Pain Location: Back Pain Descriptors / Indicators: Aching, Discomfort, Tightness, Spasm Pain Onset: More than a month ago Pain Frequency: Intermittent     BMI - recorded: 24.05 Nutritional Status: BMI of 19-24  Normal Nutritional Risks: None Diabetes: No  How often do you need to have someone help you when you read instructions, pamphlets, or other written materials from your doctor or pharmacy?: 1 - Never  Diabetic? Borderline Nutrition Risk Assessment:  Has the patient had any N/V/D within the last 2 months?  No  Does the patient have any non-healing wounds?  No  Has the patient had any unintentional weight loss or weight gain?  No   Diabetes:  Is the patient diabetic?  Yes  If diabetic, was a CBG obtained today?  No  Did the patient bring in their glucometer from home?  No  How often do you monitor your CBG's? Once daily usually.   Financial Strains and Diabetes Management:  Are you having any financial strains with the device, your supplies or your medication? No .  Does the patient want to be seen by Chronic Care Management for management of their diabetes?  No  Would the patient like to be referred to a  Nutritionist or for Diabetic Management?  No   Diabetic Exams:  Diabetic Eye Exam: Completed 11/2020.  Diabetic Foot Exam: Completed 12/22/2020. Pt has been advised about the importance in completing this exam. Pt is scheduled for diabetic foot exam on next year.    Interpreter Needed?: No  Information entered by :: Jazzalynn Rhudy, LPN   Activities of Daily Living In your present state of health, do you have any difficulty performing the following activities: 02/08/2021  Hearing? N  Vision? N  Difficulty concentrating or making decisions? N  Walking or climbing stairs? N  Dressing or bathing? N  Doing errands, shopping? N  Preparing Food and eating ? N  Using the Toilet? N  In the past six months, have you accidently leaked urine? N  Do you have problems with loss of bowel control? N  Managing your Medications? N  Managing your Finances? N  Housekeeping or managing your Housekeeping? N  Some recent data might be hidden    Patient Care Team: Chevis Pretty, FNP as PCP - General (Family Medicine) Sueanne Margarita, MD as PCP - Cardiology (Cardiology) Gatha Mayer, MD as Consulting Physician (Gastroenterology) Harlen Labs, MD as Referring Physician (Optometry)  Indicate any recent Medical Services you may have received from other than [facility] providers in the past year (date may be approximate).     Assessment:   This is a routine wellness examination for [place].  Hearing/Vision screen Hearing Screening - Comments:: Denies hearing difficulties  Vision Screening - Comments:: Wears rx glasses - up to date with annual eye exams with [facility]  Dietary issues and exercise activities discussed: Current Exercise Habits: Home exercise routine, Type of exercise: walking;strength training/weights;Other - see comments (trains bird dogs, raises horses, works on a farm, very active), Time (Minutes): 60, Frequency (Times/Week): 7, Weekly Exercise (Minutes/Week): 420,  Intensity: Moderate, Exercise limited by: None identified   Goals Addressed             This Visit's Progress    DIET - INCREASE WATER INTAKE   Not  on track    Try to drink 6-8 glasses of water daily     Exercise 150 min/wk Moderate Activity   On track      Depression Screen PHQ 2/9 Scores 02/08/2021 12/22/2020 06/20/2020 03/15/2020 02/08/2020 12/03/2019 08/20/2019  PHQ - 2 Score 0 0 0 0 0 0 0  PHQ- 9 Score 0 0 - - - - -    Fall Risk Fall Risk  02/08/2021 12/22/2020 06/20/2020 03/15/2020 02/08/2020  Falls in the past year? 0 0 1 0 0  Number falls in past yr: 0 - 0 - -  Injury with Fall? 0 - 0 - -  Risk for fall due to : Orthopedic patient - Other (Comment) - -  Risk for fall due to: Comment - - on ice - -  Follow up Falls prevention discussed - Education provided - Falls evaluation completed  Comment - - - - -    FALL RISK PREVENTION PERTAINING TO THE HOME:  Any stairs in or around the home? Yes  If so, are there any without handrails? No  Home free of loose throw rugs in walkways, pet beds, electrical cords, etc? Yes  Adequate lighting in your home to reduce risk of falls? Yes   ASSISTIVE DEVICES UTILIZED TO PREVENT FALLS:  Life alert? No  Use of a cane, walker or w/c? No  Grab bars in the bathroom? Yes  Shower chair or bench in shower? Yes  Elevated toilet seat or a handicapped toilet? No   TIMED UP AND GO:  Was the test performed? No . Telephonic visit  Cognitive Function: MMSE - Mini Mental State Exam 07/22/2017 07/19/2016  Orientation to time 5 5  Orientation to Place 5 5  Registration 3 3  Attention/ Calculation 4 5  Recall 2 0  Language- name 2 objects 2 2  Language- repeat 1 1  Language- follow 3 step command 3 3  Language- read & follow direction 1 1  Write a sentence 1 1  Copy design 1 1  Total score 28 27     6CIT Screen 02/08/2021 02/08/2020 02/05/2019  What Year? 0 points 0 points 0 points  What month? 0 points - 0 points  What time? 0 points  0 points 0 points  Count back from 20 0 points 0 points 0 points  Months in reverse 0 points - 0 points  Repeat phrase 0 points 0 points 0 points  Total Score 0 - 0    Immunizations Immunization History  Administered Date(s) Administered   Pneumococcal Conjugate-13 04/14/2013   Pneumococcal Polysaccharide-23 09/16/2014   Td 01/29/2015    TDAP status: Up to date  Flu Vaccine status: Declined, Education has been provided regarding the importance of this vaccine but patient still declined. Advised may receive this vaccine at local pharmacy or Health Dept. Aware to provide a copy of the vaccination record if obtained from local pharmacy or Health Dept. Verbalized acceptance and understanding.  Pneumococcal vaccine status: Up to date  Covid-19 vaccine status: Declined, Education has been provided regarding the importance of this vaccine but patient still declined. Advised may receive this vaccine at local pharmacy or Health Dept.or vaccine clinic. Aware to provide a copy of the vaccination record if obtained from local pharmacy or Health Dept. Verbalized acceptance and understanding.  Qualifies for Shingles Vaccine? Yes   Zostavax completed No   Shingrix Completed?: No.    Education has been provided regarding the importance of this vaccine. Patient has been advised to  call insurance company to determine out of pocket expense if they have not yet received this vaccine. Advised may also receive vaccine at local pharmacy or Health Dept. Verbalized acceptance and understanding.  Screening Tests Health Maintenance  Topic Date Due   COVID-19 Vaccine (1) Never done   Zoster Vaccines- Shingrix (1 of 2) 03/23/2021 (Originally 05/21/1995)   OPHTHALMOLOGY EXAM  04/21/2021 (Originally 12/07/2018)   HEMOGLOBIN A1C  03/23/2021   FOOT EXAM  12/22/2021   TETANUS/TDAP  01/28/2025   Pneumonia Vaccine 61+ Years old  Completed   Hepatitis C Screening  Completed   HPV VACCINES  Aged Out   INFLUENZA  VACCINE  Discontinued    Health Maintenance  Health Maintenance Due  Topic Date Due   COVID-19 Vaccine (1) Never done    Colorectal cancer screening: No longer required.   Lung Cancer Screening: (Low Dose CT Chest recommended if Age 61-80 years, 30 pack-year currently smoking OR have quit w/in 15years.) does not qualify.   Additional Screening:  Hepatitis C Screening: does qualify; Completed 03/15/2015  Vision Screening: Recommended annual ophthalmology exams for early detection of glaucoma and other disorders of the eye. Is the patient up to date with their annual eye exam?  Yes  Who is the provider or what is the name of the office in which the patient attends annual eye exams? [facility] If pt is not established with a provider, would they like to be referred to a provider to establish care? No .   Dental Screening: Recommended annual dental exams for proper oral hygiene  Community Resource Referral / Chronic Care Management: CRR required this visit?  No   CCM required this visit?  No      Plan:     I have personally reviewed and noted the following in the patient's chart:   Medical and social history Use of alcohol, tobacco or illicit drugs  Current medications and supplements including opioid prescriptions. Patient is not currently taking opioid prescriptions. Functional ability and status Nutritional status Physical activity Advanced directives List of other physicians Hospitalizations, surgeries, and ER visits in previous 12 months Vitals Screenings to include cognitive, depression, and falls Referrals and appointments  In addition, I have reviewed and discussed with patient certain preventive protocols, quality metrics, and best practice recommendations. A written personalized care plan for preventive services as well as general preventive health recommendations were provided to patient.     Sandrea Hammond, LPN   [id number]   Nurse Notes:  None

## 2021-02-08 NOTE — Patient Instructions (Signed)
Peter York , Thank you for taking time to come for your Medicare Wellness Visit. I appreciate your ongoing commitment to your health goals. Please review the following plan we discussed and let me know if I can assist you in the future.   Screening recommendations/referrals: Colonoscopy: done 11/10/2020 - no repeat required Recommended yearly ophthalmology/optometry visit for glaucoma screening and checkup Recommended yearly dental visit for hygiene and checkup  Vaccinations: Influenza vaccine: Declined Pneumococcal vaccine: Done 04/14/2013 & 09/16/2014 Tdap vaccine: Done 01/29/2015 - Repeat in 10 yearsD Shingles vaccine: due   Covid-19: declined  Advanced directives: Advance directive discussed with you today. Even though you declined this today, please call our office should you change your mind, and we can give you the proper paperwork for you to fill out.   Conditions/risks identified: Keep up the great work! Try the back exercises at the end of this summary.  Next appointment: Follow up in one year for your annual wellness visit.   Preventive Care 74 Years and Older, Male  Preventive care refers to lifestyle choices and visits with your health care provider that can promote health and wellness. What does preventive care include? A yearly physical exam. This is also called an annual well check. Dental exams once or twice a year. Routine eye exams. Ask your health care provider how often you should have your eyes checked. Personal lifestyle choices, including: Daily care of your teeth and gums. Regular physical activity. Eating a healthy diet. Avoiding tobacco and drug use. Limiting alcohol use. Practicing safe sex. Taking low doses of aspirin every day. Taking vitamin and mineral supplements as recommended by your health care provider. What happens during an annual well check? The services and screenings done by your health care provider during your annual well check will depend  on your age, overall health, lifestyle risk factors, and family history of disease. Counseling  Your health care provider may ask you questions about your: Alcohol use. Tobacco use. Drug use. Emotional well-being. Home and relationship well-being. Sexual activity. Eating habits. History of falls. Memory and ability to understand (cognition). Work and work Statistician. Screening  You may have the following tests or measurements: Height, weight, and BMI. Blood pressure. Lipid and cholesterol levels. These may be checked every 5 years, or more frequently if you are over 37 years old. Skin check. Lung cancer screening. You may have this screening every year starting at age 66 if you have a 30-pack-year history of smoking and currently smoke or have quit within the past 15 years. Fecal occult blood test (FOBT) of the stool. You may have this test every year starting at age 38. Flexible sigmoidoscopy or colonoscopy. You may have a sigmoidoscopy every 5 years or a colonoscopy every 10 years starting at age 74. Prostate cancer screening. Recommendations will vary depending on your family history and other risks. Hepatitis C blood test. Hepatitis B blood test. Sexually transmitted disease (STD) testing. Diabetes screening. This is done by checking your blood sugar (glucose) after you have not eaten for a while (fasting). You may have this done every 1-3 years. Abdominal aortic aneurysm (AAA) screening. You may need this if you are a current or former smoker. Osteoporosis. You may be screened starting at age 80 if you are at high risk. Talk with your health care provider about your test results, treatment options, and if necessary, the need for more tests. Vaccines  Your health care provider may recommend certain vaccines, such as: Influenza vaccine. This is recommended  every year. Tetanus, diphtheria, and acellular pertussis (Tdap, Td) vaccine. You may need a Td booster every 10  years. Zoster vaccine. You may need this after age 108. Pneumococcal 13-valent conjugate (PCV13) vaccine. One dose is recommended after age 66. Pneumococcal polysaccharide (PPSV23) vaccine. One dose is recommended after age 40. Talk to your health care provider about which screenings and vaccines you need and how often you need them. This information is not intended to replace advice given to you by your health care provider. Make sure you discuss any questions you have with your health care provider. Document Released: 04/08/2015 Document Revised: 11/30/2015 Document Reviewed: 01/11/2015 Elsevier Interactive Patient Education  2017 [place] Prevention in the Home Falls can cause injuries. They can happen to people of all ages. There are many things you can do to make your home safe and to help prevent falls. What can I do on the outside of my home? Regularly fix the edges of walkways and driveways and fix any cracks. Remove anything that might make you trip as you walk through a door, such as a raised step or threshold. Trim any bushes or trees on the path to your home. Use bright outdoor lighting. Clear any walking paths of anything that might make someone trip, such as rocks or tools. Regularly check to see if handrails are loose or broken. Make sure that both sides of any steps have handrails. Any raised decks and porches should have guardrails on the edges. Have any leaves, snow, or ice cleared regularly. Use sand or salt on walking paths during winter. Clean up any spills in your garage right away. This includes oil or grease spills. What can I do in the bathroom? Use night lights. Install grab bars by the toilet and in the tub and shower. Do not use towel bars as grab bars. Use non-skid mats or decals in the tub or shower. If you need to sit down in the shower, use a plastic, non-slip stool. Keep the floor dry. Clean up any water that spills on the floor as soon as it  happens. Remove soap buildup in the tub or shower regularly. Attach bath mats securely with double-sided non-slip rug tape. Do not have throw rugs and other things on the floor that can make you trip. What can I do in the bedroom? Use night lights. Make sure that you have a light by your bed that is easy to reach. Do not use any sheets or blankets that are too big for your bed. They should not hang down onto the floor. Have a firm chair that has side arms. You can use this for support while you get dressed. Do not have throw rugs and other things on the floor that can make you trip. What can I do in the kitchen? Clean up any spills right away. Avoid walking on wet floors. Keep items that you use a lot in easy-to-reach places. If you need to reach something above you, use a strong step stool that has a grab bar. Keep electrical cords out of the way. Do not use floor polish or wax that makes floors slippery. If you must use wax, use non-skid floor wax. Do not have throw rugs and other things on the floor that can make you trip. What can I do with my stairs? Do not leave any items on the stairs. Make sure that there are handrails on both sides of the stairs and use them. Fix handrails that are broken  or loose. Make sure that handrails are as long as the stairways. Check any carpeting to make sure that it is firmly attached to the stairs. Fix any carpet that is loose or worn. Avoid having throw rugs at the top or bottom of the stairs. If you do have throw rugs, attach them to the floor with carpet tape. Make sure that you have a light switch at the top of the stairs and the bottom of the stairs. If you do not have them, ask someone to add them for you. What else can I do to help prevent falls? Wear shoes that: Do not have high heels. Have rubber bottoms. Are comfortable and fit you well. Are closed at the toe. Do not wear sandals. If you use a stepladder: Make sure that it is fully opened.  Do not climb a closed stepladder. Make sure that both sides of the stepladder are locked into place. Ask someone to hold it for you, if possible. Clearly mark and make sure that you can see: Any grab bars or handrails. First and last steps. Where the edge of each step is. Use tools that help you move around (mobility aids) if they are needed. These include: Canes. Walkers. Scooters. Crutches. Turn on the lights when you go into a dark area. Replace any light bulbs as soon as they burn out. Set up your furniture so you have a clear path. Avoid moving your furniture around. If any of your floors are uneven, fix them. If there are any pets around you, be aware of where they are. Review your medicines with your doctor. Some medicines can make you feel dizzy. This can increase your chance of falling. Ask your doctor what other things that you can do to help prevent falls. This information is not intended to replace advice given to you by your health care provider. Make sure you discuss any questions you have with your health care provider. Document Released: 01/06/2009 Document Revised: 08/18/2015 Document Reviewed: 04/16/2014 Elsevier Interactive Patient Education  2017 [place].   Back Exercises These exercises help to make your trunk and back strong. They also help to keep the lower back flexible. Doing these exercises can help to prevent or lessen pain in your lower back. If you have back pain, try to do these exercises 2-3 times each day or as told by your doctor. As you get better, do the exercises once each day. Repeat the exercises more often as told by your doctor. To stop back pain from coming back, do the exercises once each day, or as told by your doctor. Do exercises exactly as told by your doctor. Stop right away if you feel sudden pain or your pain gets worse. Exercises Single knee to chest Do these steps 3-5 times in a row for each leg: Lie on your back on a firm bed or  the floor with your legs stretched out. Bring one knee to your chest. Grab your knee or thigh with both hands and hold it in place. Pull on your knee until you feel a gentle stretch in your lower back or butt. Keep doing the stretch for 10-30 seconds. Slowly let go of your leg and straighten it. Pelvic tilt Do these steps 5-10 times in a row: Lie on your back on a firm bed or the floor with your legs stretched out. Bend your knees so they point up to the ceiling. Your feet should be flat on the floor. Tighten your lower belly (abdomen)  muscles to press your lower back against the floor. This will make your tailbone point up to the ceiling instead of pointing down to your feet or the floor. Stay in this position for 5-10 seconds while you gently tighten your muscles and breathe evenly. Cat-cow Do these steps until your lower back bends more easily: Get on your hands and knees on a firm bed or the floor. Keep your hands under your shoulders, and keep your knees under your hips. You may put padding under your knees. Let your head hang down toward your chest. Tighten (contract) the muscles in your belly. Point your tailbone toward the floor so your lower back becomes rounded like the back of a cat. Stay in this position for 5 seconds. Slowly lift your head. Let the muscles of your belly relax. Point your tailbone up toward the ceiling so your back forms a sagging arch like the back of a cow. Stay in this position for 5 seconds.  Press-ups Do these steps 5-10 times in a row: Lie on your belly (face-down) on a firm bed or the floor. Place your hands near your head, about shoulder-width apart. While you keep your back relaxed and keep your hips on the floor, slowly straighten your arms to raise the top half of your body and lift your shoulders. Do not use your back muscles. You may change where you place your hands to make yourself more comfortable. Stay in this position for 5 seconds. Keep your  back relaxed. Slowly return to lying flat on the floor.  Bridges Do these steps 10 times in a row: Lie on your back on a firm bed or the floor. Bend your knees so they point up to the ceiling. Your feet should be flat on the floor. Your arms should be flat at your sides, next to your body. Tighten your butt muscles and lift your butt off the floor until your waist is almost as high as your knees. If you do not feel the muscles working in your butt and the back of your thighs, slide your feet 1-2 inches (2.5-5 cm) farther away from your butt. Stay in this position for 3-5 seconds. Slowly lower your butt to the floor, and let your butt muscles relax. If this exercise is too easy, try doing it with your arms crossed over your chest. Belly crunches Do these steps 5-10 times in a row: Lie on your back on a firm bed or the floor with your legs stretched out. Bend your knees so they point up to the ceiling. Your feet should be flat on the floor. Cross your arms over your chest. Tip your chin a little bit toward your chest, but do not bend your neck. Tighten your belly muscles and slowly raise your chest just enough to lift your shoulder blades a tiny bit off the floor. Avoid raising your body higher than that because it can put too much stress on your lower back. Slowly lower your chest and your head to the floor. Back lifts Do these steps 5-10 times in a row: Lie on your belly (face-down) with your arms at your sides, and rest your forehead on the floor. Tighten the muscles in your legs and your butt. Slowly lift your chest off the floor while you keep your hips on the floor. Keep the back of your head in line with the curve in your back. Look at the floor while you do this. Stay in this position for 3-5 seconds. Slowly  lower your chest and your face to the floor. Contact a doctor if: Your back pain gets a lot worse when you do an exercise. Your back pain does not get better within 2 hours  after you exercise. If you have any of these problems, stop doing the exercises. Do not do them again unless your doctor says it is okay. Get help right away if: You have sudden, very bad back pain. If this happens, stop doing the exercises. Do not do them again unless your doctor says it is okay. This information is not intended to replace advice given to you by your health care provider. Make sure you discuss any questions you have with your health care provider. Document Revised: 05/25/2020 Document Reviewed: 05/25/2020 Elsevier Patient Education  [place].

## 2021-02-10 ENCOUNTER — Other Ambulatory Visit: Payer: Self-pay | Admitting: Nurse Practitioner

## 2021-02-10 DIAGNOSIS — J309 Allergic rhinitis, unspecified: Secondary | ICD-10-CM

## 2021-02-22 DIAGNOSIS — S22000A Wedge compression fracture of unspecified thoracic vertebra, initial encounter for closed fracture: Secondary | ICD-10-CM | POA: Diagnosis not present

## 2021-02-22 DIAGNOSIS — R5383 Other fatigue: Secondary | ICD-10-CM | POA: Diagnosis not present

## 2021-02-22 DIAGNOSIS — E559 Vitamin D deficiency, unspecified: Secondary | ICD-10-CM | POA: Diagnosis not present

## 2021-02-22 DIAGNOSIS — M810 Age-related osteoporosis without current pathological fracture: Secondary | ICD-10-CM | POA: Diagnosis not present

## 2021-02-25 DIAGNOSIS — M546 Pain in thoracic spine: Secondary | ICD-10-CM | POA: Diagnosis not present

## 2021-03-01 DIAGNOSIS — M810 Age-related osteoporosis without current pathological fracture: Secondary | ICD-10-CM | POA: Diagnosis not present

## 2021-03-01 DIAGNOSIS — S22000D Wedge compression fracture of unspecified thoracic vertebra, subsequent encounter for fracture with routine healing: Secondary | ICD-10-CM | POA: Diagnosis not present

## 2021-04-04 DIAGNOSIS — S22000D Wedge compression fracture of unspecified thoracic vertebra, subsequent encounter for fracture with routine healing: Secondary | ICD-10-CM | POA: Diagnosis not present

## 2021-04-04 DIAGNOSIS — M810 Age-related osteoporosis without current pathological fracture: Secondary | ICD-10-CM | POA: Diagnosis not present

## 2021-06-20 ENCOUNTER — Encounter: Payer: Self-pay | Admitting: Nurse Practitioner

## 2021-06-20 ENCOUNTER — Ambulatory Visit (INDEPENDENT_AMBULATORY_CARE_PROVIDER_SITE_OTHER): Payer: PPO | Admitting: Nurse Practitioner

## 2021-06-20 VITALS — BP 134/80 | HR 57 | Temp 97.6°F | Resp 20 | Ht 66.0 in | Wt 151.0 lb

## 2021-06-20 DIAGNOSIS — I10 Essential (primary) hypertension: Secondary | ICD-10-CM | POA: Diagnosis not present

## 2021-06-20 DIAGNOSIS — I671 Cerebral aneurysm, nonruptured: Secondary | ICD-10-CM | POA: Diagnosis not present

## 2021-06-20 DIAGNOSIS — E119 Type 2 diabetes mellitus without complications: Secondary | ICD-10-CM

## 2021-06-20 DIAGNOSIS — Z125 Encounter for screening for malignant neoplasm of prostate: Secondary | ICD-10-CM | POA: Diagnosis not present

## 2021-06-20 DIAGNOSIS — I6930 Unspecified sequelae of cerebral infarction: Secondary | ICD-10-CM | POA: Diagnosis not present

## 2021-06-20 DIAGNOSIS — E782 Mixed hyperlipidemia: Secondary | ICD-10-CM

## 2021-06-20 DIAGNOSIS — M810 Age-related osteoporosis without current pathological fracture: Secondary | ICD-10-CM | POA: Diagnosis not present

## 2021-06-20 LAB — BAYER DCA HB A1C WAIVED: HB A1C (BAYER DCA - WAIVED): 5.5 % (ref 4.8–5.6)

## 2021-06-20 MED ORDER — LOSARTAN POTASSIUM 100 MG PO TABS: 100.0000 mg | ORAL_TABLET | Freq: Every day | ORAL | 1 refills | Status: DC

## 2021-06-20 MED ORDER — OZEMPIC (0.25 OR 0.5 MG/DOSE) 2 MG/1.5ML ~~LOC~~ SOPN: 0.5000 mg | PEN_INJECTOR | SUBCUTANEOUS | 3 refills | Status: DC

## 2021-06-20 MED ORDER — AMLODIPINE BESYLATE 10 MG PO TABS: 10.0000 mg | ORAL_TABLET | Freq: Every day | ORAL | 1 refills | Status: DC

## 2021-06-20 MED ORDER — HYDRALAZINE HCL 25 MG PO TABS: 25.0000 mg | ORAL_TABLET | Freq: Three times a day (TID) | ORAL | 1 refills | Status: DC

## 2021-06-20 MED ORDER — EZETIMIBE 10 MG PO TABS: 10.0000 mg | ORAL_TABLET | Freq: Every day | ORAL | 3 refills | Status: DC

## 2021-06-20 MED ORDER — ATORVASTATIN CALCIUM 10 MG PO TABS: 10.0000 mg | ORAL_TABLET | Freq: Every day | ORAL | 1 refills | Status: DC

## 2021-06-20 NOTE — Patient Instructions (Signed)
Bone Health ?Bones protect organs, store calcium, anchor muscles, and support the whole body. Keeping your bones strong is important, especially as you get older. You can take actions to help keep your bones strong and healthy. ?Why is keeping my bones healthy important? ?Keeping your bones healthy is important because your body constantly replaces bone cells. Cells get old, and new cells take their place. As we age, we lose bone cells because the body may not be able to make enough new cells to replace the old cells. The amount of bone cells and bone tissue you have is referred to as bone mass. The higher your bone mass, the stronger your bones. ?The aging process leads to an overall loss of bone mass in the body, which can increase the likelihood of: ?Broken bones. ?A condition in which the bones become weak and brittle (osteoporosis). ?A large decline in bone mass occurs in older adults. In women, it occurs about the time of menopause. ?What actions can I take to keep my bones healthy? ?Good health habits are important for maintaining healthy bones. This includes eating nutritious foods and exercising regularly. To have healthy bones, you need to get enough of the right minerals and vitamins. Most nutrition experts recommend getting these nutrients from the foods that you eat. In some cases, taking supplements may also be recommended. Doing certain types of exercise is also important for bone health. ?What are the nutritional recommendations for healthy bones? ?Eating a well-balanced diet with plenty of calcium and vitamin D will help to protect your bones. Nutritional recommendations vary from person to person. Ask your health care provider what is healthy for you. Here are some general guidelines. ?Get enough calcium ?Calcium is the most important (essential) mineral for bone health. Most people can get enough calcium from their diet, but supplements may be recommended for people who are at risk for  osteoporosis. Good sources of calcium include: ?Dairy products, such as low-fat or nonfat milk, cheese, and yogurt. ?Dark green leafy vegetables, such as bok choy and broccoli. ?Foods that have calcium added to them (are fortified). Foods that may be fortified with calcium include orange juice, cereal, bread, soy beverages, and tofu products. ?Nuts, such as almonds. ?Follow these recommended amounts for daily calcium intake: ?Infants, 0-6 months: 200 mg. ?Infants, 6-12 months: 260 mg. ?Children, age 871-3: 700 mg. ?Children, age 87-8: 1,000 mg. ?Children, age 64-13: 1,300 mg. ?Teens, age 65-18: 1,300 mg. ?Adults, age 54-50: 1,000 mg. ?Adults, age 25-70: ?Men: 1,000 mg. ?Women: 1,200 mg. ?Adults, age 64 or older: 1,200 mg. ?Pregnant and breastfeeding females: ?Teens: 1,300 mg. ?Adults: 1,000 mg. ?Get enough vitamin D ?Vitamin D is the most essential vitamin for bone health. It helps the body absorb calcium. Sunlight stimulates the skin to make vitamin D, so be sure to get enough sunlight. If you live in a cold climate or you do not get outside often, your health care provider may recommend that you take vitamin D supplements. Good sources of vitamin D in your diet include: ?Egg yolks. ?Saltwater fish. ?Milk and cereal fortified with vitamin D. ?Follow these recommended amounts for daily vitamin D intake: ?Infants, 0-12 months: 400 international units (IU). ?Children and teens, age 871-18: 37 international units. ?Adults, age 54 or younger: 12 international units. ?Adults, age 878 or older: 74-1,000 international units. ?Get other important nutrients ?Other nutrients that are important for bone health include: ?Phosphorus. This mineral is found in meat, poultry, dairy foods, nuts, and legumes. The recommended daily  intake for adult men and adult women is 700 mg. ?Magnesium. This mineral is found in seeds, nuts, dark green vegetables, and legumes. The recommended daily intake for adult men is 400-420 mg. For adult women,  it is 310-320 mg. ?Vitamin K. This vitamin is found in green leafy vegetables. The recommended daily intake is 120 mcg for adult men and 90 mcg for adult women. ?What type of physical activity is best for building and maintaining healthy bones? ?Weight-bearing and strength-building activities are important for building and maintaining healthy bones. Weight-bearing activities cause muscles and bones to work against gravity. Strength-building activities increase the strength of the muscles that support bones. Weight-bearing and muscle-building activities include: ?Walking and hiking. ?Jogging and running. ?Dancing. ?Gym exercises. ?Lifting weights. ?Tennis and racquetball. ?Climbing stairs. ?Aerobics. ?Adults should get at least 30 minutes of moderate physical activity on most days. Children should get at least 60 minutes of moderate physical activity on most days. Ask your health care provider what type of exercise is best for you. ?How can I find out if my bone mass is low? ?Bone mass can be measured with an X-ray test called a bone mineral density (BMD) test. This test is recommended for all women who are age 19 or older. It may also be recommended for: ?Men who are age 52 or older. ?People who are at risk for osteoporosis because of: ?Having a long-term disease that weakens bones, such as kidney disease or rheumatoid arthritis. ?Having menopause earlier than normal. ?Taking medicine that weakens bones, such as steroids, thyroid hormones, or hormone treatment for breast cancer or prostate cancer. ?Smoking. ?Drinking three or more alcoholic drinks a day. ?Being underweight. ?Sedentary lifestyle. ?If you find that you have a low bone mass, you may be able to prevent osteoporosis or further bone loss by changing your diet and lifestyle. ?Where can I find more information? ?Bone Health & Osteoporosis Foundation: [employer]fr ?[employer] of Health: www.bones.SouthExposed.es ?International Osteoporosis  Foundation: Administrator.iofbonehealth.org ?Summary ?The aging process leads to an overall loss of bone mass in the body, which can increase the likelihood of broken bones and osteoporosis. ?Eating a well-balanced diet with plenty of calcium and vitamin D will help to protect your bones. ?Weight-bearing and strength-building activities are also important for building and maintaining strong bones. ?Bone mass can be measured with an X-ray test called a bone mineral density (BMD) test. ?This information is not intended to replace advice given to you by your health care provider. Make sure you discuss any questions you have with your health care provider. ?Document Revised: 08/24/2020 Document Reviewed: 08/24/2020 ?Elsevier Patient Education ? [place]. ? ?

## 2021-06-20 NOTE — Progress Notes (Signed)
? ?Subjective:  ? ? Patient ID: Peter York, male    DOB: [birth year], 76 y.o.   MRN: [id number] ? ? ?Chief Complaint: medical management of chronic issues  ?  ? ?HPI: ? ?Peter York is a 76 y.o. who identifies as a male who was assigned male at birth.  ? 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 ? ? ?Comes in today for follow up of the following chronic medical issues: ? ?1. Primary hypertension ?No c/o chest pain, sob or headache. Does not check blood pressure at home. ?BP Readings from Last 3 Encounters:  ?02/08/21 130/80  ?01/30/21 120/69  ?12/22/20 130/80  ? ? ? ?2. Mixed hyperlipidemia ?Does try to wtahc iet and stays very active with his hunting dogs and horses. ?Lab Results  ?Component Value Date  ? CHOL 125 12/22/2020  ? HDL 37 (L) 12/22/2020  ? [place] 73 12/22/2020  ? TRIG 75 12/22/2020  ? CHOLHDL 3.4 12/22/2020  ? ?The ASCVD Risk score (Arnett DK, et al., 2019) failed to calculate for the following reasons: ?  The valid total cholesterol range is 130 to 320 mg/dL ? ? ?3. Cerebral aneurysm without rupture ?His last head CT was 08/19/20. Showed no change in size of aneurysm that heyu are watching. ? ?4. Type 2 diabetes mellitus without complication, without long-term current use of insulin ([place]) ?Fasting blood sugars run around 110-120. No low blood sugars. He only takes glimiperide when blood sugar is up. ? ?5. Late effect of cerebrovascular accident (CVA) ?Was a heorrhagic stroke. He has no permanent effects for stroke. ? ?6. Osteoporosis of vertebra ?Last dexascan was 05/12/19. His t score was -1.8. He has had fractures in the past. Riding horses causes a lot of strain on his lumbar spine. ? ? ?New complaints: ?None today. ? ?Allergies  ?Allergen Reactions  ? Crestor [Rosuvastatin Calcium] Other (See Comments)  ?  "like to killed me." joint pain, unable to get OOB  ? Benazepril Other (See Comments)  ?   hyperkalemia  ? ?Outpatient Encounter Medications as of 06/20/2021  ?Medication Sig  ? acetaminophen (TYLENOL) 650 MG CR tablet Take 650 mg by mouth daily as needed for pain.  ? amLODipine (NORVASC) 10 MG tablet Take 1 tablet (10 mg total) by mouth daily.  ? atorvastatin (LIPITOR) 10 MG tablet Take 1 tablet (10 mg total) by mouth daily.  ? Calcium Carbonate-Vitamin D (CALCIUM-D PO) Take 1 tablet by mouth at bedtime.  ? colchicine 0.6 MG tablet Take 0.6 mg by mouth daily as needed.  ? cromolyn (OPTICROM) 4 % ophthalmic solution 1 drop 2 (two) times daily.  ? cyclobenzaprine (FLEXERIL) 5 MG tablet Take 1 tablet (5 mg total) by mouth 3 (three) times daily as needed for muscle spasms.  ? ezetimibe (ZETIA) 10 MG tablet Take 1 tablet (10 mg total) by mouth daily.  ? fluticasone (FLONASE) 50 MCG/ACT nasal spray SPRAY 2 SPRAYS INTO EACH NOSTRIL EVERY DAY  ? glucose blood (ONETOUCH ULTRA) test strip Check BS daily Dx E11.9  ? hydrALAZINE (APRESOLINE) 25 MG tablet TAKE 1 TABLET BY MOUTH THREE TIMES A DAY  ? losartan (COZAAR) 100 MG tablet Take 1 tablet (100 mg total) by mouth daily.  ? Semaglutide,0.25 or 0.5MG/DOS, (OZEMPIC, 0.25 OR 0.5 MG/DOSE,) 2 MG/1.5ML SOPN Inject 0.5 mg into the skin once a week.  ? Specialty Vitamins Products (PROSTATE PO) Take 1 tablet by mouth  2 (two) times a day. Super Beta Prostate  ? vitamin E 400 UNIT capsule Take 1 capsule (400 Units total) by mouth daily.  ? ?No facility-administered encounter medications on file as of 06/20/2021.  ? ? ?Past Surgical History:  ?Procedure Laterality Date  ? ANAL FISTULECTOMY  10/02/2011  ? Procedure: FISTULECTOMY ANAL;  Surgeon: Joyice Faster. Cornett, MD;  Location: [facility];  Service: General;  Laterality: N/A;  excision perianal mass   ? APPENDECTOMY    ? COLONOSCOPY W/ POLYPECTOMY  12/31/2005  ? Dr. Silvano Rusk  ? UMBILICAL HERNIA REPAIR    ? ? ?Family History  ?Problem Relation Age of Onset  ? Colon cancer Father 58  ? Colon polyps Father   ?  Cancer Father 58  ?     Colon  ? Heart attack Maternal Uncle   ?     X 4  ? Heart disease Maternal Uncle   ? Heart disease Maternal Uncle   ? Heart disease Maternal Uncle   ? Diabetes Daughter 66  ?     Type 1  ? Transient ischemic attack Brother 53  ? ? ? ? ?Controlled substance contract: n/a ? ? ? ? ?Review of Systems  ?Constitutional:  Negative for diaphoresis.  ?Eyes:  Negative for pain.  ?Respiratory:  Negative for shortness of breath.   ?Cardiovascular:  Negative for chest pain, palpitations and leg swelling.  ?Gastrointestinal:  Negative for abdominal pain.  ?Endocrine: Negative for polydipsia.  ?Skin:  Negative for rash.  ?Neurological:  Negative for dizziness, weakness and headaches.  ?Hematological:  Does not bruise/bleed easily.  ?All other systems reviewed and are negative. ? ?   ?Objective:  ? Physical Exam ?Vitals and nursing note reviewed.  ?Constitutional:   ?   Appearance: Normal appearance. He is well-developed.  ?HENT:  ?   Head: Normocephalic.  ?   Nose: Nose normal.  ?   Mouth/Throat:  ?   Mouth: Mucous membranes are moist.  ?   Pharynx: Oropharynx is clear.  ?Eyes:  ?   Pupils: Pupils are equal, round, and reactive to light.  ?Neck:  ?   Thyroid: No thyroid mass or thyromegaly.  ?   Vascular: No carotid bruit or JVD.  ?   Trachea: Phonation normal.  ?Cardiovascular:  ?   Rate and Rhythm: Normal rate and regular rhythm.  ?Pulmonary:  ?   Effort: Pulmonary effort is normal. No respiratory distress.  ?   Breath sounds: Normal breath sounds.  ?Abdominal:  ?   General: Bowel sounds are normal.  ?   Palpations: Abdomen is soft.  ?   Tenderness: There is no abdominal tenderness.  ?Musculoskeletal:     ?   General: Normal range of motion.  ?   Cervical back: Normal range of motion and neck supple.  ?Lymphadenopathy:  ?   Cervical: No cervical adenopathy.  ?Skin: ?   General: Skin is warm and dry.  ?Neurological:  ?   Mental Status: He is alert and oriented to person, place, and time.  ?Psychiatric:      ?   Behavior: Behavior normal.     ?   Thought Content: Thought content normal.     ?   Judgment: Judgment normal.  ? ? ?BP 134/80   Pulse (!) 57   Temp 97.6 ?F (36.4 ?C) (Temporal)   Resp 20   Ht 5' 6"  (1.676 m)   Wt 151 lb (68.5 kg)   SpO2 99%  BMI 24.37 kg/m?  ? ?Hgba1c 5.5% ? ? ?   ?Assessment & Plan:  ?TRAVARIUS LANGE comes in today with chief complaint of Medical Management of Chronic Issues ? ? ?Diagnosis and orders addressed: ? ?1. Primary hypertension ?Low sodium diet ?- CBC with Differential/Platelet ?- CMP14+EGFR ?- hydrALAZINE (APRESOLINE) 25 MG tablet; Take 1 tablet (25 mg total) by mouth 3 (three) times daily.  Dispense: 90 tablet; Refill: 1 ? ?2. Mixed hyperlipidemia ?Low fat diet ?- Lipid panel ?- losartan (COZAAR) 100 MG tablet; Take 1 tablet (100 mg total) by mouth daily.  Dispense: 90 tablet; Refill: 1 ?- ezetimibe (ZETIA) 10 MG tablet; Take 1 tablet (10 mg total) by mouth daily.  Dispense: 90 tablet; Refill: 3 ?- amLODipine (NORVASC) 10 MG tablet; Take 1 tablet (10 mg total) by mouth daily.  Dispense: 90 tablet; Refill: 1 ?- atorvastatin (LIPITOR) 10 MG tablet; Take 1 tablet (10 mg total) by mouth daily.  Dispense: 90 tablet; Refill: 1 ? ?3. Cerebral aneurysm without rupture ?Will do follow up CT in a couple of months ? ?4. Type 2 diabetes mellitus without complication, without long-term current use of insulin ([place]) ?Continue to watch carbs in diet ?- Bayer DCA Hb A1c Waived ?- Semaglutide,0.25 or 0.5MG/DOS, (OZEMPIC, 0.25 OR 0.5 MG/DOSE,) 2 MG/1.5ML SOPN; Inject 0.5 mg into the skin once a week.  Dispense: 6 mL; Refill: 3 ? ?5. Late effect of cerebrovascular accident (CVA) ? ?6. Osteoporosis of vertebra ?Eight bearing exercise ?Continue to wear neck brace ? ?7. Screening for prostate cancer ?- PSA, total and free ? ? ?Labs pending ?Health Maintenance reviewed ?Diet and exercise encouraged ? ?Follow up plan: ?3 months ? ? ?Mary-Margaret Hassell Done, FNP ? ? ?

## 2021-06-21 LAB — CMP14+EGFR
ALT: 27 IU/L (ref 0–44)
AST: 21 IU/L (ref 0–40)
Albumin/Globulin Ratio: 1.4 (ref 1.2–2.2)
Albumin: 4.4 g/dL (ref 3.7–4.7)
Alkaline Phosphatase: 63 IU/L (ref 44–121)
BUN/Creatinine Ratio: 14 (ref 10–24)
BUN: 22 mg/dL (ref 8–27)
Bilirubin Total: 0.5 mg/dL (ref 0.0–1.2)
CO2: 23 mmol/L (ref 20–29)
Calcium: 9.9 mg/dL (ref 8.6–10.2)
Chloride: 106 mmol/L (ref 96–106)
Creatinine, Ser: 1.62 mg/dL — ABNORMAL HIGH (ref 0.76–1.27)
Globulin, Total: 3.1 g/dL (ref 1.5–4.5)
Glucose: 105 mg/dL — ABNORMAL HIGH (ref 70–99)
Potassium: 5.4 mmol/L — ABNORMAL HIGH (ref 3.5–5.2)
Sodium: 140 mmol/L (ref 134–144)
Total Protein: 7.5 g/dL (ref 6.0–8.5)
eGFR: 44 mL/min/{1.73_m2} — ABNORMAL LOW (ref >59)

## 2021-06-21 LAB — PSA, TOTAL AND FREE
PSA, Free Pct: 49.1 %
PSA, Free: 3.39 ng/mL
Prostate Specific Ag, Serum: 6.9 ng/mL — ABNORMAL HIGH (ref 0.0–4.0)

## 2021-06-21 LAB — CBC WITH DIFFERENTIAL/PLATELET
Basophils Absolute: 0.1 10*3/uL (ref 0.0–0.2)
Basos: 1 %
EOS (ABSOLUTE): 0.3 10*3/uL (ref 0.0–0.4)
Eos: 5 %
Hematocrit: 35.4 % — ABNORMAL LOW (ref 37.5–51.0)
Hemoglobin: 12.1 g/dL — ABNORMAL LOW (ref 13.0–17.7)
Immature Grans (Abs): 0 10*3/uL (ref 0.0–0.1)
Immature Granulocytes: 0 %
Lymphocytes Absolute: 1.2 10*3/uL (ref 0.7–3.1)
Lymphs: 21 %
MCH: 31.3 pg (ref 26.6–33.0)
MCHC: 34.2 g/dL (ref 31.5–35.7)
MCV: 92 fL (ref 79–97)
Monocytes Absolute: 0.4 10*3/uL (ref 0.1–0.9)
Monocytes: 7 %
Neutrophils Absolute: 3.9 10*3/uL (ref 1.4–7.0)
Neutrophils: 66 %
Platelets: 262 10*3/uL (ref 150–450)
RBC: 3.86 x10E6/uL — ABNORMAL LOW (ref 4.14–5.80)
RDW: 12.9 % (ref 11.6–15.4)
WBC: 6 10*3/uL (ref 3.4–10.8)

## 2021-06-21 LAB — LIPID PANEL
Chol/HDL Ratio: 2.9 ratio (ref 0.0–5.0)
Cholesterol, Total: 121 mg/dL (ref 100–199)
HDL: 42 mg/dL (ref >39)
LDL Chol Calc (NIH): 62 mg/dL (ref 0–99)
Triglycerides: 84 mg/dL (ref 0–149)
VLDL Cholesterol Cal: 17 mg/dL (ref 5–40)

## 2021-06-23 ENCOUNTER — Ambulatory Visit: Payer: PPO | Admitting: Nurse Practitioner

## 2021-07-03 DIAGNOSIS — R972 Elevated prostate specific antigen [PSA]: Secondary | ICD-10-CM | POA: Diagnosis not present

## 2021-07-10 DIAGNOSIS — R972 Elevated prostate specific antigen [PSA]: Secondary | ICD-10-CM | POA: Diagnosis not present

## 2021-07-10 DIAGNOSIS — R351 Nocturia: Secondary | ICD-10-CM | POA: Diagnosis not present

## 2021-07-10 DIAGNOSIS — N401 Enlarged prostate with lower urinary tract symptoms: Secondary | ICD-10-CM | POA: Diagnosis not present

## 2021-07-19 ENCOUNTER — Other Ambulatory Visit: Payer: Self-pay | Admitting: Nurse Practitioner

## 2021-07-19 DIAGNOSIS — I10 Essential (primary) hypertension: Secondary | ICD-10-CM

## 2021-08-02 ENCOUNTER — Other Ambulatory Visit: Payer: Self-pay | Admitting: Neurosurgery

## 2021-08-02 DIAGNOSIS — I671 Cerebral aneurysm, nonruptured: Secondary | ICD-10-CM

## 2021-08-08 DIAGNOSIS — Z7984 Long term (current) use of oral hypoglycemic drugs: Secondary | ICD-10-CM | POA: Diagnosis not present

## 2021-08-08 DIAGNOSIS — I671 Cerebral aneurysm, nonruptured: Secondary | ICD-10-CM | POA: Diagnosis not present

## 2021-08-08 DIAGNOSIS — N183 Chronic kidney disease, stage 3 unspecified: Secondary | ICD-10-CM | POA: Diagnosis not present

## 2021-08-08 DIAGNOSIS — I701 Atherosclerosis of renal artery: Secondary | ICD-10-CM | POA: Diagnosis not present

## 2021-08-08 DIAGNOSIS — E1122 Type 2 diabetes mellitus with diabetic chronic kidney disease: Secondary | ICD-10-CM | POA: Diagnosis not present

## 2021-08-11 ENCOUNTER — Other Ambulatory Visit: Payer: Self-pay | Admitting: Nurse Practitioner

## 2021-08-11 DIAGNOSIS — I10 Essential (primary) hypertension: Secondary | ICD-10-CM

## 2021-08-12 ENCOUNTER — Other Ambulatory Visit: Payer: Self-pay | Admitting: Nurse Practitioner

## 2021-08-12 DIAGNOSIS — I10 Essential (primary) hypertension: Secondary | ICD-10-CM

## 2021-08-28 ENCOUNTER — Ambulatory Visit
Admission: RE | Admit: 2021-08-28 | Discharge: 2021-08-28 | Disposition: A | Discharge status: Home / Self Care | Payer: PPO | Source: Ambulatory Visit | Attending: Neurosurgery | Admitting: Neurosurgery

## 2021-08-28 DIAGNOSIS — I671 Cerebral aneurysm, nonruptured: Secondary | ICD-10-CM | POA: Diagnosis not present

## 2021-08-28 DIAGNOSIS — I672 Cerebral atherosclerosis: Secondary | ICD-10-CM | POA: Diagnosis not present

## 2021-08-28 MED ORDER — IOPAMIDOL (ISOVUE-370) INJECTION 76%
60.0000 mL | Freq: Once | INTRAVENOUS | Status: AC | PRN
  Administered 2021-08-28: 60 mL via INTRAVENOUS

## 2021-08-29 DIAGNOSIS — T1502XA Foreign body in cornea, left eye, initial encounter: Secondary | ICD-10-CM | POA: Diagnosis not present

## 2021-08-29 DIAGNOSIS — H10013 Acute follicular conjunctivitis, bilateral: Secondary | ICD-10-CM | POA: Diagnosis not present

## 2021-08-29 DIAGNOSIS — S0502XA Injury of conjunctiva and corneal abrasion without foreign body, left eye, initial encounter: Secondary | ICD-10-CM | POA: Diagnosis not present

## 2021-09-04 DIAGNOSIS — H10013 Acute follicular conjunctivitis, bilateral: Secondary | ICD-10-CM | POA: Diagnosis not present

## 2021-09-04 DIAGNOSIS — T1512XA Foreign body in conjunctival sac, left eye, initial encounter: Secondary | ICD-10-CM | POA: Diagnosis not present

## 2021-09-17 ENCOUNTER — Other Ambulatory Visit: Payer: Self-pay | Admitting: Nurse Practitioner

## 2021-09-17 DIAGNOSIS — J309 Allergic rhinitis, unspecified: Secondary | ICD-10-CM

## 2021-09-19 DIAGNOSIS — I671 Cerebral aneurysm, nonruptured: Secondary | ICD-10-CM | POA: Diagnosis not present

## 2021-09-21 ENCOUNTER — Other Ambulatory Visit: Payer: Self-pay | Admitting: Nurse Practitioner

## 2021-09-28 ENCOUNTER — Encounter: Payer: Self-pay | Admitting: Nurse Practitioner

## 2021-09-28 ENCOUNTER — Ambulatory Visit (INDEPENDENT_AMBULATORY_CARE_PROVIDER_SITE_OTHER): Payer: PPO | Admitting: Nurse Practitioner

## 2021-09-28 VITALS — BP 131/73 | HR 56 | Temp 97.6°F | Resp 20 | Ht 66.0 in | Wt 151.0 lb

## 2021-09-28 DIAGNOSIS — E119 Type 2 diabetes mellitus without complications: Secondary | ICD-10-CM

## 2021-09-28 DIAGNOSIS — M810 Age-related osteoporosis without current pathological fracture: Secondary | ICD-10-CM | POA: Diagnosis not present

## 2021-09-28 DIAGNOSIS — I619 Nontraumatic intracerebral hemorrhage, unspecified: Secondary | ICD-10-CM

## 2021-09-28 DIAGNOSIS — I671 Cerebral aneurysm, nonruptured: Secondary | ICD-10-CM | POA: Diagnosis not present

## 2021-09-28 DIAGNOSIS — I10 Essential (primary) hypertension: Secondary | ICD-10-CM | POA: Diagnosis not present

## 2021-09-28 DIAGNOSIS — I6930 Unspecified sequelae of cerebral infarction: Secondary | ICD-10-CM | POA: Diagnosis not present

## 2021-09-28 DIAGNOSIS — E782 Mixed hyperlipidemia: Secondary | ICD-10-CM | POA: Diagnosis not present

## 2021-09-28 DIAGNOSIS — S22060D Wedge compression fracture of T7-T8 vertebra, subsequent encounter for fracture with routine healing: Secondary | ICD-10-CM

## 2021-09-28 LAB — BAYER DCA HB A1C WAIVED: HB A1C (BAYER DCA - WAIVED): 5.6 % (ref 4.8–5.6)

## 2021-09-28 MED ORDER — EZETIMIBE 10 MG PO TABS: 10.0000 mg | ORAL_TABLET | Freq: Every day | ORAL | 3 refills | Status: DC

## 2021-09-28 MED ORDER — LOSARTAN POTASSIUM 100 MG PO TABS: 100.0000 mg | ORAL_TABLET | Freq: Every day | ORAL | 1 refills | Status: DC

## 2021-09-28 MED ORDER — ATORVASTATIN CALCIUM 10 MG PO TABS: 10.0000 mg | ORAL_TABLET | Freq: Every day | ORAL | 1 refills | Status: DC

## 2021-09-28 MED ORDER — AMLODIPINE BESYLATE 10 MG PO TABS: 10.0000 mg | ORAL_TABLET | Freq: Every day | ORAL | 1 refills | Status: DC

## 2021-09-28 MED ORDER — GLIMEPIRIDE 4 MG PO TABS: 4.0000 mg | ORAL_TABLET | Freq: Every day | ORAL | 1 refills | Status: DC

## 2021-09-28 NOTE — Progress Notes (Signed)
Subjective:    Patient ID: Peter York, male    DOB: 02-Aug-1945, 76 y.o.   MRN: [id number]   Chief Complaint: medical management of chronic issues     HPI:  Peter York is a 76 y.o. who identifies as a male who was assigned male at birth.   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 Primary hypertension No c/o chest pain , sob or headache. Does not check blood pressure at home. BP Readings from Last 3 Encounters:  06/20/21 134/80  02/08/21 130/80  01/30/21 120/69     2. Mixed hyperlipidemia Does not watch appetite.stays very active with his horses and hurting dogs. Lab Results  Component Value Date   CHOL 121 06/20/2021   HDL 42 06/20/2021   LDLCALC 62 06/20/2021   TRIG 84 06/20/2021   CHOLHDL 2.9 06/20/2021     3. Type 2 diabetes mellitus without complication, without long-term current use of insulin ([facility]) Fasting blood sugars have been running around 90-120. No low blood sugars. Is now only on glimepiride 1/2 tablet. Lab Results  Component Value Date   HGBA1C 5.5 06/20/2021     4. Cerebral aneurysm without rupture 5. Hemorrhagic stroke ([place]) 6. Late effect of cerebrovascular accident (CVA) Is doing well. Has no permanent effects, and is doing well.  7. Osteoporosis of vertebra 8. Compression fracture of T7 vertebra with routine healing, subsequent encounter Patient has had compression fractures in his back. They think that riding horses has causes back problems. Last dexascan was done on 05/12/19. His t score was -2.0. he still has been riding horses a lot. Not a lot of weight bearing exercises.   New complaints: None today  Allergies  Allergen Reactions   Crestor [Rosuvastatin Calcium] Other (See Comments)    "like to killed me." joint pain, unable to get OOB   Benazepril Other (See Comments)    hyperkalemia   Outpatient  Encounter Medications as of 09/28/2021  Medication Sig   acetaminophen (TYLENOL) 650 MG CR tablet Take 650 mg by mouth daily as needed for pain.   amLODipine (NORVASC) 10 MG tablet Take 1 tablet (10 mg total) by mouth daily.   atorvastatin (LIPITOR) 10 MG tablet Take 1 tablet (10 mg total) by mouth daily.   Calcium Carbonate-Vitamin D (CALCIUM-D PO) Take 1 tablet by mouth at bedtime.   colchicine 0.6 MG tablet Take 0.6 mg by mouth daily as needed.   cromolyn (OPTICROM) 4 % ophthalmic solution 1 drop 2 (two) times daily.   cyclobenzaprine (FLEXERIL) 5 MG tablet Take 1 tablet (5 mg total) by mouth 3 (three) times daily as needed for muscle spasms.   ezetimibe (ZETIA) 10 MG tablet Take 1 tablet (10 mg total) by mouth daily.   fluticasone (FLONASE) 50 MCG/ACT nasal spray SPRAY 2 SPRAYS INTO EACH NOSTRIL EVERY DAY   glucose blood (ONETOUCH ULTRA) test strip CHECK BLOOD SUGAR DAILY DX E11.9   hydrALAZINE (APRESOLINE) 25 MG tablet TAKE 1 TABLET BY MOUTH THREE TIMES A DAY   losartan (COZAAR) 100 MG tablet Take 1 tablet (100 mg total) by mouth daily.   Semaglutide,0.25 or 0.5MG/DOS, (OZEMPIC, 0.25 OR 0.5 MG/DOSE,) 2 MG/1.5ML SOPN Inject 0.5 mg into the skin once a week.   Specialty Vitamins Products (PROSTATE PO) Take 1 tablet by mouth 2 (two) times a day. Super Beta Prostate   vitamin E  400 UNIT capsule Take 1 capsule (400 Units total) by mouth daily.   No facility-administered encounter medications on file as of 09/28/2021.    Past Surgical History:  Procedure Laterality Date   ANAL FISTULECTOMY  10/02/2011   Procedure: FISTULECTOMY ANAL;  Surgeon: Joyice Faster. Cornett, MD;  Location: [facility];  Service: General;  Laterality: N/A;  excision perianal mass    APPENDECTOMY     COLONOSCOPY W/ POLYPECTOMY  12/31/2005   Dr. Silvano Rusk   UMBILICAL HERNIA REPAIR      Family History  Problem Relation Age of Onset   Colon cancer Father 22   Colon polyps Father    Cancer Father 72        Colon   Heart attack Maternal Uncle        X 4   Heart disease Maternal Uncle    Heart disease Maternal Uncle    Heart disease Maternal Uncle    Diabetes Daughter 33       Type 1   Transient ischemic attack Brother 49      Controlled substance contract: n/a     Review of Systems  Constitutional:  Negative for diaphoresis.  Eyes:  Negative for pain.  Respiratory:  Negative for shortness of breath.   Cardiovascular:  Negative for chest pain, palpitations and leg swelling.  Gastrointestinal:  Negative for abdominal pain.  Endocrine: Negative for polydipsia.  Skin:  Negative for rash.  Neurological:  Negative for dizziness, weakness and headaches.  Hematological:  Does not bruise/bleed easily.  All other systems reviewed and are negative.      Objective:   Physical Exam Vitals and nursing note reviewed.  Constitutional:      Appearance: Normal appearance. He is well-developed.  HENT:     Head: Normocephalic.     Nose: Nose normal.     Mouth/Throat:     Mouth: Mucous membranes are moist.     Pharynx: Oropharynx is clear.  Eyes:     Pupils: Pupils are equal, round, and reactive to light.  Neck:     Thyroid: No thyroid mass or thyromegaly.     Vascular: No carotid bruit or JVD.     Trachea: Phonation normal.  Cardiovascular:     Rate and Rhythm: Normal rate and regular rhythm.  Pulmonary:     Effort: Pulmonary effort is normal. No respiratory distress.     Breath sounds: Normal breath sounds.  Abdominal:     General: Bowel sounds are normal.     Palpations: Abdomen is soft.     Tenderness: There is no abdominal tenderness.  Musculoskeletal:        General: Normal range of motion.     Cervical back: Normal range of motion and neck supple.  Lymphadenopathy:     Cervical: No cervical adenopathy.  Skin:    General: Skin is warm and dry.  Neurological:     Mental Status: He is alert and oriented to person, place, and time.  Psychiatric:        Behavior:  Behavior normal.        Thought Content: Thought content normal.        Judgment: Judgment normal.     BP 131/73   Pulse (!) 56   Temp 97.6 F (36.4 C) (Temporal)   Resp 20   Ht _0  (1.676 m)   Wt 151 lb (68.5 kg)   SpO2 99%   BMI 24.37 kg/m  HGBA1c 5.6%  Assessment & Plan:   Peter York comes in today with chief complaint of Medical Management of Chronic Issues   Diagnosis and orders addressed:  1. Primary hypertension Low sodium diet - CBC with Differential/Platelet - CMP14+EGFR  2. Mixed hyperlipidemia Low fat diet - Lipid panel - losartan (COZAAR) 100 MG tablet; Take 1 tablet (100 mg total) by mouth daily.  Dispense: 90 tablet; Refill: 1 - ezetimibe (ZETIA) 10 MG tablet; Take 1 tablet (10 mg total) by mouth daily.  Dispense: 90 tablet; Refill: 3 - atorvastatin (LIPITOR) 10 MG tablet; Take 1 tablet (10 mg total) by mouth daily.  Dispense: 90 tablet; Refill: 1 - amLODipine (NORVASC) 10 MG tablet; Take 1 tablet (10 mg total) by mouth daily.  Dispense: 90 tablet; Refill: 1  3. Type 2 diabetes mellitus without complication, without long-term current use of insulin ([facility]) Continue to watch carbs in diet - Bayer DCA Hb A1c Waived - Microalbumin / creatinine urine ratio - glimepiride (AMARYL) 4 MG tablet; Take 1 tablet (4 mg total) by mouth daily with breakfast.  Dispense: 90 tablet; Refill: 1  4. Cerebral aneurysm without rupture Report any headaches  5. Hemorrhagic stroke ([place])  6. Late effect of cerebrovascular accident (CVA)  7. Osteoporosis of vertebra Weight bearing exercises  8. Compression fracture of T7 vertebra with routine healing, subsequent encounter - DG [facility] DEXA   Labs pending Health Maintenance reviewed Diet and exercise encouraged  Follow up plan: 6 months   [place], FNP

## 2021-09-29 ENCOUNTER — Ambulatory Visit (INDEPENDENT_AMBULATORY_CARE_PROVIDER_SITE_OTHER): Payer: PPO

## 2021-09-29 DIAGNOSIS — S22060D Wedge compression fracture of T7-T8 vertebra, subsequent encounter for fracture with routine healing: Secondary | ICD-10-CM

## 2021-09-29 DIAGNOSIS — M8589 Other specified disorders of bone density and structure, multiple sites: Secondary | ICD-10-CM | POA: Diagnosis not present

## 2021-09-29 LAB — CBC WITH DIFFERENTIAL/PLATELET
Basophils Absolute: 0.1 10*3/uL (ref 0.0–0.2)
Basos: 1 %
EOS (ABSOLUTE): 0.4 10*3/uL (ref 0.0–0.4)
Eos: 6 %
Hematocrit: 34.4 % — ABNORMAL LOW (ref 37.5–51.0)
Hemoglobin: 11.6 g/dL — ABNORMAL LOW (ref 13.0–17.7)
Immature Grans (Abs): 0 10*3/uL (ref 0.0–0.1)
Immature Granulocytes: 0 %
Lymphocytes Absolute: 1.1 10*3/uL (ref 0.7–3.1)
Lymphs: 19 %
MCH: 31.3 pg (ref 26.6–33.0)
MCHC: 33.7 g/dL (ref 31.5–35.7)
MCV: 93 fL (ref 79–97)
Monocytes Absolute: 0.4 10*3/uL (ref 0.1–0.9)
Monocytes: 7 %
Neutrophils Absolute: 3.8 10*3/uL (ref 1.4–7.0)
Neutrophils: 67 %
Platelets: 243 10*3/uL (ref 150–450)
RBC: 3.71 x10E6/uL — ABNORMAL LOW (ref 4.14–5.80)
RDW: 12.5 % (ref 11.6–15.4)
WBC: 5.7 10*3/uL (ref 3.4–10.8)

## 2021-09-29 LAB — CMP14+EGFR
ALT: 18 IU/L (ref 0–44)
AST: 22 IU/L (ref 0–40)
Albumin/Globulin Ratio: 1.6 (ref 1.2–2.2)
Albumin: 4.4 g/dL (ref 3.7–4.7)
Alkaline Phosphatase: 63 IU/L (ref 44–121)
BUN/Creatinine Ratio: 15 (ref 10–24)
BUN: 29 mg/dL — ABNORMAL HIGH (ref 8–27)
Bilirubin Total: 0.6 mg/dL (ref 0.0–1.2)
CO2: 20 mmol/L (ref 20–29)
Calcium: 9.4 mg/dL (ref 8.6–10.2)
Chloride: 104 mmol/L (ref 96–106)
Creatinine, Ser: 1.98 mg/dL — ABNORMAL HIGH (ref 0.76–1.27)
Globulin, Total: 2.7 g/dL (ref 1.5–4.5)
Glucose: 106 mg/dL — ABNORMAL HIGH (ref 70–99)
Potassium: 4.9 mmol/L (ref 3.5–5.2)
Sodium: 138 mmol/L (ref 134–144)
Total Protein: 7.1 g/dL (ref 6.0–8.5)
eGFR: 34 mL/min/{1.73_m2} — ABNORMAL LOW (ref >59)

## 2021-09-29 LAB — LIPID PANEL
Chol/HDL Ratio: 3.1 ratio (ref 0.0–5.0)
Cholesterol, Total: 118 mg/dL (ref 100–199)
HDL: 38 mg/dL — ABNORMAL LOW (ref >39)
LDL Chol Calc (NIH): 65 mg/dL (ref 0–99)
Triglycerides: 71 mg/dL (ref 0–149)
VLDL Cholesterol Cal: 15 mg/dL (ref 5–40)

## 2021-09-29 LAB — MICROALBUMIN / CREATININE URINE RATIO
Creatinine, Urine: 106 mg/dL
Microalb/Creat Ratio: 25 mg/g creat (ref 0–29)
Microalbumin, Urine: 26.7 ug/mL

## 2021-10-05 DIAGNOSIS — E113293 Type 2 diabetes mellitus with mild nonproliferative diabetic retinopathy without macular edema, bilateral: Secondary | ICD-10-CM | POA: Diagnosis not present

## 2021-10-05 DIAGNOSIS — H0102A Squamous blepharitis right eye, upper and lower eyelids: Secondary | ICD-10-CM | POA: Diagnosis not present

## 2021-10-05 DIAGNOSIS — H1045 Other chronic allergic conjunctivitis: Secondary | ICD-10-CM | POA: Diagnosis not present

## 2021-11-23 ENCOUNTER — Encounter: Payer: Self-pay | Admitting: Cardiology

## 2021-11-23 ENCOUNTER — Ambulatory Visit: Payer: PPO | Attending: Cardiology | Admitting: Cardiology

## 2021-11-23 VITALS — BP 126/62 | HR 65 | Ht 66.0 in | Wt 151.6 lb

## 2021-11-23 DIAGNOSIS — I10 Essential (primary) hypertension: Secondary | ICD-10-CM

## 2021-11-23 DIAGNOSIS — N1831 Chronic kidney disease, stage 3a: Secondary | ICD-10-CM | POA: Diagnosis not present

## 2021-11-23 DIAGNOSIS — E782 Mixed hyperlipidemia: Secondary | ICD-10-CM | POA: Diagnosis not present

## 2021-11-23 NOTE — Patient Instructions (Signed)
Medication Instructions:  Your physician recommends that you continue on your current medications as directed. Please refer to the Current Medication list given to you today.  *If you need a refill on your cardiac medications before your next appointment, please call your pharmacy*  Follow-Up: At [facility], you and your health needs are our priority.  As part of our continuing mission to provide you with exceptional heart care, we have created designated Provider Care Teams.  These Care Teams include your primary Cardiologist (physician) and Advanced Practice Providers (APPs -  Physician Assistants and Nurse Practitioners) who all work together to provide you with the care you need, when you need it.  Your next appointment:   1 year(s)  The format for your next appointment:   In Person  Provider:   Traci Turner, MD     Important Information About Sugar       

## 2021-11-23 NOTE — Progress Notes (Signed)
Cardiology Office Note:    Date:  11/23/2021   ID:  Peter York, DOB 1946-03-06, MRN [id number]  PCP:  Peter Pretty, FNP  Cardiologist:  Peter Him, MD    Referring MD: Peter York, *   Chief Complaint  Patient presents with   Hypertension   Hyperlipidemia    History of Present Illness:    Peter York is a 76 y.o. male with a hx of HTN, DM and dyslipidemia.  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 His ASA was stopped and he has essentially regained all his deficits after rehab. He is here today for followup and is doing well.  He denies any chest pain or pressure, SOB, DOE, PND, orthopnea, LE edema, dizziness (except if he stands up too fast), palpitations or syncope. He is compliant with his meds and is tolerating meds with no SE.      Past Medical History:  Diagnosis Date   Adenomatous colon polyp 12/31/2005   COVID-19 02/2020   Diabetes mellitus    Hypercholesterolemia    Hypertension    Intracranial hemorrhage ([place]) 2020   secondary to hypertensive crisis   Renal artery stenosis (HCC)    Left renal artery 1-59% by dopplers 10/2018    Past Surgical History:  Procedure Laterality Date   ANAL FISTULECTOMY  10/02/2011   Procedure: FISTULECTOMY ANAL;  Surgeon: Joyice Faster. Cornett, MD;  Location: [place] [facility];  Service: General;  Laterality: N/A;  excision perianal mass    APPENDECTOMY     COLONOSCOPY W/ POLYPECTOMY  12/31/2005   Dr. Silvano Rusk   UMBILICAL HERNIA REPAIR      Current Medications: Current Meds  Medication Sig   acetaminophen (TYLENOL) 650 MG CR tablet Take 650 mg by mouth daily as needed for pain.   amLODipine (NORVASC) 10 MG tablet Take 1 tablet (10 mg total) by mouth daily.   atorvastatin (LIPITOR) 10 MG tablet Take 1 tablet (10 mg total) by mouth daily.   Calcium Carbonate-Vitamin D (CALCIUM-D PO) Take 1 tablet by mouth at bedtime.   colchicine 0.6 MG tablet Take 0.6 mg by mouth daily as needed.    cromolyn (OPTICROM) 4 % ophthalmic solution 1 drop 2 (two) times daily.   cyclobenzaprine (FLEXERIL) 5 MG tablet Take 1 tablet (5 mg total) by mouth 3 (three) times daily as needed for muscle spasms.   ezetimibe (ZETIA) 10 MG tablet Take 1 tablet (10 mg total) by mouth daily.   fluticasone (FLONASE) 50 MCG/ACT nasal spray SPRAY 2 SPRAYS INTO EACH NOSTRIL EVERY DAY   glimepiride (AMARYL) 4 MG tablet Take 1 tablet (4 mg total) by mouth daily with breakfast.   glucose blood (ONETOUCH ULTRA) test strip CHECK BLOOD SUGAR DAILY DX E11.9   hydrALAZINE (APRESOLINE) 25 MG tablet TAKE 1 TABLET BY MOUTH THREE TIMES A DAY   losartan (COZAAR) 100 MG tablet Take 1 tablet (100 mg total) by mouth daily.   Specialty Vitamins Products (PROSTATE PO) Take 1 tablet by mouth 2 (two) times a day. Super Beta Prostate   vitamin E 400 UNIT capsule Take 1 capsule (400 Units total) by mouth daily.     Allergies:   Crestor [rosuvastatin calcium] and Benazepril   Social History   Socioeconomic History   Marital status: Married    Spouse name: Peter York   Number of children: 3   Years of education: 14   Highest education level: Some college, no degree  Occupational History   Occupation:  retired  Tobacco Use   Smoking status: Former    Packs/day: 0.75    Years: 15.00    Total pack years: 11.25    Types: Cigarettes    Quit date: 03/26/1978    Years since quitting: 43.6   Smokeless tobacco: Never  Vaping Use   Vaping Use: Never used  Substance and Sexual Activity   Alcohol use: Yes    Comment: cocktail once a month   Drug use: No   Sexual activity: Yes  Other Topics Concern   Not on file  Social History Narrative   Very active and independent - trains bird dogs, raises horses, etc   Retired Print production planner   Married 1 son, 2 daughters (son in [place])   grandkids and greatgrandkids   No EtOH, former smoker and no drugs   Social Determinants of Health   Financial Resource Strain: Low Risk  (02/08/2021)    Overall Financial Resource Strain (CARDIA)    Difficulty of Paying Living Expenses: Not hard at all  Food Insecurity: No Food Insecurity (02/08/2021)   Hunger Vital Sign    Worried About Running Out of Food in the Last Year: Never true    [place] in the Last Year: Never true  Transportation Needs: No Transportation Needs (02/08/2021)   PRAPARE - Hydrologist (Medical): No    Lack of Transportation (Non-Medical): No  Physical Activity: Sufficiently Active (02/08/2021)   Exercise Vital Sign    Days of Exercise per Week: 7 days    Minutes of Exercise per Session: 90 min  Stress: No Stress Concern Present (02/08/2021)   [place]    Feeling of Stress : Not at all  Social Connections: Moderately Integrated (02/08/2021)   Social Connection and Isolation Panel [NHANES]    Frequency of Communication with Friends and Family: More than three times a week    Frequency of Social Gatherings with Friends and Family: More than three times a week    Attends Religious Services: Never    Marine scientist or Organizations: Yes    Attends Music therapist: More than 4 times per year    Marital Status: Married     Family History: The patient's family history includes Cancer (age of onset: 72) in his father; Colon cancer (age of onset: 71) in his father; Colon polyps in his father; Diabetes (age of onset: 68) in his daughter; Heart attack in his maternal uncle; Heart disease in his maternal uncle, maternal uncle, and maternal uncle; Transient ischemic attack (age of onset: 20) in his brother.  ROS:   Please see the history of present illness.    ROS  All other systems reviewed and negative.   EKGs/Labs/Other Studies Reviewed:    The following studies were reviewed today: EKG  EKG:  EKG is  ordered today.  The ekg ordered today demonstrates NSR with no ST changes  Recent  Labs: 09/28/2021: ALT 18; BUN 29; Creatinine, Ser 1.98; Hemoglobin 11.6; Platelets 243; Potassium 4.9; Sodium 138   Recent Lipid Panel    Component Value Date/Time   CHOL 118 09/28/2021 0801   CHOL 165 10/02/2012 0834   TRIG 71 09/28/2021 0801   TRIG 115 03/09/2014 0818   TRIG 95 10/02/2012 0834   HDL 38 (L) 09/28/2021 0801   HDL 43 03/09/2014 0818   HDL 51 10/02/2012 0834   CHOLHDL 3.1 09/28/2021 0801   CHOLHDL  5.3 06/29/2018 1401   VLDL 35 06/29/2018 1401   LDLCALC 65 09/28/2021 0801   LDLCALC 223 (H) 09/10/2013 0905   LDLCALC 95 10/02/2012 0834        Physical Exam:    VS:  BP 126/62   Pulse 65   Ht '5\' 6"'$  (1.676 m)   Wt 151 lb 9.6 oz (68.8 kg)   SpO2 98%   BMI 24.47 kg/m     Wt Readings from Last 3 Encounters:  11/23/21 151 lb 9.6 oz (68.8 kg)  09/28/21 151 lb (68.5 kg)  06/20/21 151 lb (68.5 kg)     GEN: Well nourished, well developed in no acute distress HEENT: Normal NECK: No JVD; No carotid bruits LYMPHATICS: No lymphadenopathy CARDIAC:RRR, no murmurs, rubs, gallops RESPIRATORY:  Clear to auscultation without rales, wheezing or rhonchi  ABDOMEN: Soft, non-tender, non-distended MUSCULOSKELETAL:  No edema; No deformity  SKIN: Warm and dry NEUROLOGIC:  Alert and oriented x 3 PSYCHIATRIC:  Normal affect   ASSESSMENT:    1. Benign essential HTN   2. Mixed hyperlipidemia   3. Stage 3a chronic kidney disease (HCC)    PLAN:    In order of problems listed above:  Hypertension  -BP is adequately controlled on exam today -Continue with drug management with amlodipine 10 mg daily, hydralazine 25 mg 2 times daily and losartan 100 mg daily with as needed refills -I have personally reviewed and interpreted outside labs performed by patient's PCP which showed serum creatinine 1.98 and potassium 4.9 on 09/28/2021  Hyperlipidemia  - his LDL goal is < 100.   -I have personally reviewed and interpreted outside labs performed by patient's PCP which showed LDL 65 and  HDL 38 on 09/28/2021.  ALT was normal at 18. -Continue prescription drug management with atorvastatin 10 mg daily and Zetia 10 mg daily with as needed refills  CKD stage 3a  -His last serum creatinine was 1.98 in July - this is followed by his PCP.     Medication Adjustments/Labs and Tests Ordered: Current medicines are reviewed at length with the patient today.  Concerns regarding medicines are outlined above.  Orders Placed This Encounter  Procedures   EKG 12-Lead   No orders of the defined types were placed in this encounter.   Signed, Peter Him, MD  11/23/2021 2:00 PM    [place]

## 2021-12-25 ENCOUNTER — Ambulatory Visit (INDEPENDENT_AMBULATORY_CARE_PROVIDER_SITE_OTHER): Payer: PPO

## 2021-12-25 ENCOUNTER — Ambulatory Visit (INDEPENDENT_AMBULATORY_CARE_PROVIDER_SITE_OTHER): Payer: PPO | Admitting: Nurse Practitioner

## 2021-12-25 ENCOUNTER — Encounter: Payer: Self-pay | Admitting: Nurse Practitioner

## 2021-12-25 VITALS — BP 127/65 | HR 55 | Temp 97.3°F | Resp 20 | Ht 66.0 in | Wt 149.0 lb

## 2021-12-25 DIAGNOSIS — R1084 Generalized abdominal pain: Secondary | ICD-10-CM | POA: Diagnosis not present

## 2021-12-25 DIAGNOSIS — R109 Unspecified abdominal pain: Secondary | ICD-10-CM | POA: Diagnosis not present

## 2021-12-25 NOTE — Patient Instructions (Signed)

## 2021-12-25 NOTE — Progress Notes (Signed)
   Subjective:    Patient ID: Peter York, male    DOB: 1945-12-02, 76 y.o.   MRN: [id number]  Pt seen for abdominal pain  Abdominal Pain This is a recurrent problem. The current episode started more than 1 month ago. The onset quality is gradual. The problem occurs intermittently. The most recent episode lasted 1 day (1-2 days). The problem has been unchanged. The pain is located in the LLQ and RLQ. The pain is at a severity of 5/10. The quality of the pain is cramping. The abdominal pain does not radiate. Associated symptoms include constipation. Pertinent negatives include no diarrhea, fever, nausea or vomiting. The pain is aggravated by certain positions and eating. The pain is relieved by Bowel movements and liquids (liquid diet x last several days). He has tried nothing for the symptoms. Prior workup: KUB. s/p appendectomy      Review of Systems  Constitutional:  Negative for chills and fever.  Respiratory:  Negative for chest tightness and shortness of breath.   Cardiovascular:  Negative for chest pain, palpitations and leg swelling.  Gastrointestinal:  Positive for abdominal pain and constipation. Negative for abdominal distention, blood in stool, diarrhea, nausea and vomiting.  Genitourinary:  Negative for difficulty urinating.  Neurological:  Negative for dizziness, weakness and light-headedness.  All other systems reviewed and are negative.      Objective:   Physical Exam Vitals and nursing note reviewed.  Constitutional:      General: He is not in acute distress.    Appearance: He is well-developed.  Pulmonary:     Effort: Pulmonary effort is normal.  Abdominal:     General: Bowel sounds are normal. There is no distension or abdominal bruit.     Palpations: Abdomen is soft. There is no hepatomegaly, splenomegaly or mass.     Tenderness: There is no abdominal tenderness.  Skin:    General: Skin is warm and dry.  Neurological:     Mental Status: He is alert.   BP  127/65   Pulse (!) 55   Temp (!) 97.3 F (36.3 C) (Temporal)   Resp 20   Ht '5\' 6"'$  (1.676 m)   Wt 149 lb (67.6 kg)   SpO2 99%   BMI 24.05 kg/m          Assessment & Plan:  Peter York in today with chief complaint of Abdominal Pain (Thinks it may be diverticulitis)   1. Generalized abdominal pain Milk of Magnesium and prune juice. Notify if pain redevelops; will follow up with a CT scan. - DG Abd 1 View KUB with moderate stool burden   The above assessment and management plan was discussed with the patient. The patient verbalized understanding of and has agreed to the management plan. Patient is aware to call the clinic if symptoms persist or worsen. Patient is aware when to return to the clinic for a follow-up visit. Patient educated on when it is appropriate to go to the emergency department.   Collene Leyden, FNP student  Peter York, [place]

## 2022-01-01 ENCOUNTER — Telehealth: Payer: Self-pay | Admitting: Nurse Practitioner

## 2022-01-01 DIAGNOSIS — R1084 Generalized abdominal pain: Secondary | ICD-10-CM

## 2022-01-01 NOTE — Telephone Encounter (Signed)
I'll  defer to PCP

## 2022-01-01 NOTE — Telephone Encounter (Signed)
Patient was seen on 10/2 and was told that if he continued to have pain that he may need a CT scan. He is still having pain and would like to talk to someone about having the CT scan ordered. Please call back and advise.

## 2022-01-02 ENCOUNTER — Ambulatory Visit (HOSPITAL_COMMUNITY)
Admission: RE | Admit: 2022-01-02 | Discharge: 2022-01-02 | Disposition: A | Discharge status: Home / Self Care | Payer: PPO | Source: Ambulatory Visit | Attending: Nurse Practitioner | Admitting: Nurse Practitioner

## 2022-01-02 DIAGNOSIS — R1084 Generalized abdominal pain: Secondary | ICD-10-CM | POA: Diagnosis not present

## 2022-01-02 DIAGNOSIS — N281 Cyst of kidney, acquired: Secondary | ICD-10-CM | POA: Diagnosis not present

## 2022-01-02 DIAGNOSIS — N3289 Other specified disorders of bladder: Secondary | ICD-10-CM | POA: Diagnosis not present

## 2022-01-02 DIAGNOSIS — R1030 Lower abdominal pain, unspecified: Secondary | ICD-10-CM | POA: Diagnosis not present

## 2022-01-02 LAB — POCT I-STAT CREATININE: Creatinine, Ser: 1.9 mg/dL — ABNORMAL HIGH (ref 0.61–1.24)

## 2022-01-02 MED ORDER — IOHEXOL 300 MG/ML  SOLN
100.0000 mL | Freq: Once | INTRAMUSCULAR | Status: AC | PRN
  Administered 2022-01-02: 100 mL via INTRAVENOUS

## 2022-01-25 ENCOUNTER — Encounter: Payer: Self-pay | Admitting: Surgery

## 2022-01-25 ENCOUNTER — Ambulatory Visit: Payer: PPO | Admitting: Surgery

## 2022-01-25 VITALS — BP 159/66 | HR 56 | Temp 98.1°F | Resp 14 | Ht 66.0 in | Wt 147.0 lb

## 2022-01-25 DIAGNOSIS — K4090 Unilateral inguinal hernia, without obstruction or gangrene, not specified as recurrent: Secondary | ICD-10-CM | POA: Diagnosis not present

## 2022-01-25 NOTE — Progress Notes (Signed)
[facility] History and Physical  Reason for Referral: Right inguinal hernia Referring Physician: Self-referral  Chief Complaint   New Patient (Initial Visit)     Peter York is a 76 y.o. male.  HPI: Patient presents for evaluation of a right inguinal hernia.  In March 2023, he felt and heard a pop when placing a saddle on a horse.  Since that time he has noted a reducible bulge in his right groin.  He has pain associated with activity, especially while riding his horses.  He is also noted he has had increased constipation over the last month.  He takes prune juice, and this alleviates his constipation.  He confirms passing flatus.  He denies any nausea or vomiting.  His surgical history is significant for an open appendectomy for perforated appendicitis and an open umbilical hernia repair when he was 76 years old.  His medical history significant for hemorrhagic stroke, diabetes, and hypertension.  He denies use of blood thinning medications.  He will occasionally drink alcohol.  He denies use of tobacco products and illicit drugs.  Past Medical History:  Diagnosis Date   Adenomatous colon polyp 12/31/2005   COVID-19 02/2020   Diabetes mellitus    Hypercholesterolemia    Hypertension    Intracranial hemorrhage ([place]) 2020   secondary to hypertensive crisis   Renal artery stenosis (HCC)    Left renal artery 1-59% by dopplers 10/2018    Past Surgical History:  Procedure Laterality Date   ANAL FISTULECTOMY  10/02/2011   Procedure: FISTULECTOMY ANAL;  Surgeon: Joyice Faster. Cornett, MD;  Location: [facility];  Service: General;  Laterality: N/A;  excision perianal mass    APPENDECTOMY     COLONOSCOPY W/ POLYPECTOMY  12/31/2005   Dr. Silvano Rusk   UMBILICAL HERNIA REPAIR      Family History  Problem Relation Age of Onset   Colon cancer Father 59   Colon polyps Father    Cancer Father 21       Colon   Heart attack Maternal Uncle        X 4   Heart  disease Maternal Uncle    Heart disease Maternal Uncle    Heart disease Maternal Uncle    Diabetes Daughter 64       Type 1   Transient ischemic attack Brother 22    Social History   Tobacco Use   Smoking status: Former    Packs/day: 0.75    Years: 15.00    Total pack years: 11.25    Types: Cigarettes    Quit date: 03/26/1978    Years since quitting: 43.8   Smokeless tobacco: Never  Vaping Use   Vaping Use: Never used  Substance Use Topics   Alcohol use: Yes    Comment: cocktail once a month   Drug use: No    Medications: I have reviewed the patient's current medications. Allergies as of 01/25/2022       Reactions   Crestor [rosuvastatin Calcium] Other (See Comments)   "like to killed me." joint pain, unable to get OOB   Benazepril Other (See Comments)   hyperkalemia        Medication List        Accurate as of January 25, 2022  1:54 PM. If you have any questions, ask your nurse or doctor.          STOP taking these medications    cyclobenzaprine 5 MG tablet Commonly known as: FLEXERIL  Stopped by: Flint Melter Emersyn Kotarski, DO       TAKE these medications    acetaminophen 650 MG CR tablet Commonly known as: TYLENOL Take 650 mg by mouth daily as needed for pain.   amLODipine 10 MG tablet Commonly known as: NORVASC Take 1 tablet (10 mg total) by mouth daily.   atorvastatin 10 MG tablet Commonly known as: LIPITOR Take 1 tablet (10 mg total) by mouth daily. What changed: when to take this   CALCIUM-D PO Take 1 tablet by mouth at bedtime.   colchicine 0.6 MG tablet Take 0.6 mg by mouth daily as needed.   cromolyn 4 % ophthalmic solution Commonly known as: OPTICROM 1 drop 2 (two) times daily.   ezetimibe 10 MG tablet Commonly known as: ZETIA Take 1 tablet (10 mg total) by mouth daily. What changed: when to take this   fluticasone 50 MCG/ACT nasal spray Commonly known as: FLONASE SPRAY 2 SPRAYS INTO EACH NOSTRIL EVERY DAY   glimepiride 4  MG tablet Commonly known as: AMARYL Take 1 tablet (4 mg total) by mouth daily with breakfast.   hydrALAZINE 25 MG tablet Commonly known as: APRESOLINE TAKE 1 TABLET BY MOUTH THREE TIMES A DAY   losartan 100 MG tablet Commonly known as: COZAAR Take 1 tablet (100 mg total) by mouth daily.   OneTouch Ultra test strip Generic drug: glucose blood CHECK BLOOD SUGAR DAILY DX E11.9   PROSTATE PO Take 1 tablet by mouth 2 (two) times a day. Super Beta Prostate   vitamin E 180 MG (400 UNITS) capsule Take 1 capsule (400 Units total) by mouth daily.         ROS:  Constitutional: negative for chills, fatigue, and fevers Eyes: negative for visual disturbance and pain Ears, nose, mouth, throat, and face: negative for ear drainage, sore throat, and sinus problems Respiratory: negative for cough, wheezing, and shortness of breath Cardiovascular: negative for chest pain and palpitations Gastrointestinal: positive for abdominal pain, negative for nausea, reflux symptoms, and vomiting Genitourinary:negative for dysuria, frequency, and urinary retention Integument/breast: negative for dryness and rash Hematologic/lymphatic: negative for bleeding and lymphadenopathy Musculoskeletal:negative for back pain and neck pain Neurological: negative for dizziness, tremors, and numbness Endocrine: negative for temperature intolerance  Blood pressure (!) 159/66, pulse (!) 56, temperature 98.1 F (36.7 C), temperature source Oral, resp. rate 14, height '5\' 6"'$  (1.676 m), weight 147 lb (66.7 kg), SpO2 97 %. Physical Exam Vitals reviewed.  Constitutional:      Appearance: Normal appearance.  HENT:     Head: Normocephalic and atraumatic.  Eyes:     Extraocular Movements: Extraocular movements intact.     Pupils: Pupils are equal, round, and reactive to light.  Cardiovascular:     Rate and Rhythm: Normal rate and regular rhythm.  Pulmonary:     Effort: Pulmonary effort is normal.     Breath sounds:  Normal breath sounds.  Abdominal:     Comments: Abdomen soft, nondistended, no percussion tenderness, nontender to palpation; no rigidity, guarding, rebound tenderness; soft and reducible right inguinal hernia  Musculoskeletal:        General: Normal range of motion.     Cervical back: Normal range of motion.  Skin:    General: Skin is warm and dry.  Neurological:     General: No focal deficit present.     Mental Status: He is alert and oriented to person, place, and time.  Psychiatric:        Mood and Affect: Mood  normal.        Behavior: Behavior normal.     Results: No results found for this or any previous visit (from the past 48 hour(s)).  No results found.   Assessment & Plan:  Peter York is a 76 y.o. male who presents for evaluation of right inguinal hernia.  -Explained the pathophysiology of hernias, and why we recommend surgical repair -The risk and benefits of open right inguinal hernia repair with mesh were discussed including but not limited to bleeding, infection, injury to surrounding structures, hernia recurrence, and need for additional procedures.  After careful consideration, Peter York has decided to proceed with open right inguinal hernia repair. -Patient tentatively scheduled for surgery on 11/13 -Information provided to the patient regarding hernias -Advised patient to present to the emergency department if he begins to have nausea, vomiting, worsening groin pain with nonreducible bulge, and obstipation  All questions were answered to the satisfaction of the patient.   Graciella Freer, DO [facility] [address] [place], [place] [address] [phone] (office)

## 2022-01-29 NOTE — H&P (Signed)
[facility] History and Physical  Reason for Referral: Right inguinal hernia Referring Physician: Self-referral  Chief Complaint   New Patient (Initial Visit)     Peter York is a 76 y.o. male.  HPI: Patient presents for evaluation of a right inguinal hernia.  In March 2023, he felt and heard a pop when placing a saddle on a horse.  Since that time he has noted a reducible bulge in his right groin.  He has pain associated with activity, especially while riding his horses.  He is also noted he has had increased constipation over the last month.  He takes prune juice, and this alleviates his constipation.  He confirms passing flatus.  He denies any nausea or vomiting.  His surgical history is significant for an open appendectomy for perforated appendicitis and an open umbilical hernia repair when he was 76 years old.  His medical history significant for hemorrhagic stroke, diabetes, and hypertension.  He denies use of blood thinning medications.  He will occasionally drink alcohol.  He denies use of tobacco products and illicit drugs.  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 Cornett, MD;  Location: [facility];  Service: General;  Laterality: N/A;  excision perianal mass    APPENDECTOMY     COLONOSCOPY W/ POLYPECTOMY  12/31/2005   Dr. Silvano Rusk   UMBILICAL HERNIA REPAIR      Family History  Problem Relation Age of Onset   Colon cancer Father 80   Colon polyps Father    Cancer Father 5       Colon   Heart attack Maternal Uncle        X 4   Heart  disease Maternal Uncle    Heart disease Maternal Uncle    Heart disease Maternal Uncle    Diabetes Daughter 40       Type 1   Transient ischemic attack Brother 64    Social History   Tobacco Use   Smoking status: Former    Packs/day: 0.75    Years: 15.00    Total pack years: 11.25    Types: Cigarettes    Quit date: 03/26/1978    Years since quitting: 43.8   Smokeless tobacco: Never  Vaping Use   Vaping Use: Never used  Substance Use Topics   Alcohol use: Yes    Comment: cocktail once a month   Drug use: No    Medications: I have reviewed the patient's current medications. Allergies as of 01/25/2022       Reactions   Crestor [rosuvastatin Calcium] Other (See Comments)   "like to killed me." joint pain, unable to get OOB   Benazepril Other (See Comments)   hyperkalemia        Medication List        Accurate as of January 25, 2022  1:54 PM. If you have any questions, ask your nurse or doctor.          STOP taking these medications    cyclobenzaprine 5 MG tablet Commonly known as: FLEXERIL  Stopped by: Flint Melter [employer] Null, DO       TAKE these medications    acetaminophen 650 MG CR tablet Commonly known as: TYLENOL Take 650 mg by mouth daily as needed for pain.   amLODipine 10 MG tablet Commonly known as: NORVASC Take 1 tablet (10 mg total) by mouth daily.   atorvastatin 10 MG tablet Commonly known as: LIPITOR Take 1 tablet (10 mg total) by mouth daily. What changed: when to take this   CALCIUM-D PO Take 1 tablet by mouth at bedtime.   colchicine 0.6 MG tablet Take 0.6 mg by mouth daily as needed.   cromolyn 4 % ophthalmic solution Commonly known as: OPTICROM 1 drop 2 (two) times daily.   ezetimibe 10 MG tablet Commonly known as: ZETIA Take 1 tablet (10 mg total) by mouth daily. What changed: when to take this   fluticasone 50 MCG/ACT nasal spray Commonly known as: FLONASE SPRAY 2 SPRAYS INTO EACH NOSTRIL EVERY DAY   glimepiride 4  MG tablet Commonly known as: AMARYL Take 1 tablet (4 mg total) by mouth daily with breakfast.   hydrALAZINE 25 MG tablet Commonly known as: APRESOLINE TAKE 1 TABLET BY MOUTH THREE TIMES A DAY   losartan 100 MG tablet Commonly known as: COZAAR Take 1 tablet (100 mg total) by mouth daily.   OneTouch Ultra test strip Generic drug: glucose blood CHECK BLOOD SUGAR DAILY DX E11.9   PROSTATE PO Take 1 tablet by mouth 2 (two) times a day. Super Beta Prostate   vitamin E 180 MG (400 UNITS) capsule Take 1 capsule (400 Units total) by mouth daily.         ROS:  Constitutional: negative for chills, fatigue, and fevers Eyes: negative for visual disturbance and pain Ears, nose, mouth, throat, and face: negative for ear drainage, sore throat, and sinus problems Respiratory: negative for cough, wheezing, and shortness of breath Cardiovascular: negative for chest pain and palpitations Gastrointestinal: positive for abdominal pain, negative for nausea, reflux symptoms, and vomiting Genitourinary:negative for dysuria, frequency, and urinary retention Integument/breast: negative for dryness and rash Hematologic/lymphatic: negative for bleeding and lymphadenopathy Musculoskeletal:negative for back pain and neck pain Neurological: negative for dizziness, tremors, and numbness Endocrine: negative for temperature intolerance  Blood pressure (!) 159/66, pulse (!) 56, temperature 98.1 F (36.7 C), temperature source Oral, resp. rate 14, height '5\' 6"'$  (1.676 m), weight 147 lb (66.7 kg), SpO2 97 %. Physical Exam Vitals reviewed.  Constitutional:      Appearance: Normal appearance.  HENT:     Head: Normocephalic and atraumatic.  Eyes:     Extraocular Movements: Extraocular movements intact.     Pupils: Pupils are equal, round, and reactive to light.  Cardiovascular:     Rate and Rhythm: Normal rate and regular rhythm.  Pulmonary:     Effort: Pulmonary effort is normal.     Breath sounds:  Normal breath sounds.  Abdominal:     Comments: Abdomen soft, nondistended, no percussion tenderness, nontender to palpation; no rigidity, guarding, rebound tenderness; soft and reducible right inguinal hernia  Musculoskeletal:        General: Normal range of motion.     Cervical back: Normal range of motion.  Skin:    General: Skin is warm and dry.  Neurological:     General: No focal deficit present.     Mental Status: He is alert and oriented to person, place, and time.  Psychiatric:        Mood and Affect: Mood  normal.        Behavior: Behavior normal.     Results: No results found for this or any previous visit (from the past 48 hour(s)).  No results found.   Assessment & Plan:  Peter York is a 76 y.o. male who presents for evaluation of right inguinal hernia.  -Explained the pathophysiology of hernias, and why we recommend surgical repair -The risk and benefits of open right inguinal hernia repair with mesh were discussed including but not limited to bleeding, infection, injury to surrounding structures, hernia recurrence, and need for additional procedures.  After careful consideration, Peter York has decided to proceed with open right inguinal hernia repair. -Patient tentatively scheduled for surgery on 11/13 -Information provided to the patient regarding hernias -Advised patient to present to the emergency department if he begins to have nausea, vomiting, worsening groin pain with nonreducible bulge, and obstipation  All questions were answered to the satisfaction of the patient.   Graciella Freer, DO [facility] [address] [place], [place] [address] [phone] (office)

## 2022-01-31 NOTE — Patient Instructions (Signed)
Peter York  01/31/2022     '@PREFPERIOPPHARMACY'$ @   Your procedure is scheduled on 02/05/2022.   Report to [facility] at  0800  A.M.   Call this number if you have problems the morning of surgery:  [phone]  If you experience any cold or flu symptoms such as cough, fever, chills, shortness of breath, etc. between now and your scheduled surgery, please notify us at the above number.   Remember:  Do not eat or drink after midnight.      Take these medicines the morning of surgery with A SIP OF WATER                                     amlodipine.    Do not wear jewelry, make-up or nail polish.  Do not wear lotions, powders, or perfumes, or deodorant.  Do not shave 48 hours prior to surgery.  Men may shave face and neck.  Do not bring valuables to the hospital.  [facility] is not responsible for any belongings or valuables.  Contacts, dentures or bridgework may not be worn into surgery.  Leave your suitcase in the car.  After surgery it may be brought to your room.  For patients admitted to the hospital, discharge time will be determined by your treatment team.  Patients discharged the day of surgery will not be allowed to drive home and must have someone with them for 24 hours.    Special instructions:   DO NOT smoke tobacco or vape for 24 hours before your procedure.  Please read over the following fact sheets that you were given. Coughing and Deep Breathing, Surgical Site Infection Prevention, Anesthesia Post-op Instructions, and Care and Recovery After Surgery      Open Hernia Repair, Adult, Care After What can I expect after the procedure? After the procedure, it is common to have: Mild discomfort. Slight bruising. Mild swelling. Pain in the belly (abdomen). A small amount of blood from the cut from surgery (incision). Follow these instructions at home: Your doctor may give you more specific instructions. If you have problems, call your  doctor. Medicines Take over-the-counter and prescription medicines only as told by your doctor. If told, take steps to prevent problems with pooping (constipation). You may need to: Drink enough fluid to keep your pee (urine) pale yellow. Take medicines. You will be told what medicines to take. Eat foods that are high in fiber. These include beans, whole grains, and fresh fruits and vegetables. Limit foods that are high in fat and sugar. These include fried or sweet foods. Ask your doctor if you should avoid driving or using machines while you are taking your medicine. Incision care  Follow instructions from your doctor about how to take care of your incision. Make sure you: Wash your hands with soap and water for at least 20 seconds before and after you change your bandage (dressing). If you cannot use soap and water, use hand sanitizer. Change your bandage. Leave stitches or skin glue in place for at least 2 weeks. Leave tape strips alone unless you are told to take them off. You may trim the edges of the tape strips if they curl up. Check your incision every day for signs of infection. Check for: More redness, swelling, or pain. More fluid or blood. Warmth. Pus or a bad smell. Wear loose,  soft clothing while your incision heals. Activity  Rest as told by your doctor. Do not lift anything that is heavier than 10 lb (4.5 kg), or the limit that you are told. Do not play contact sports until your doctor says that this is safe. If you were given a sedative during your procedure, do not drive or use machines until your doctor says that it is safe. A sedative is a medicine that helps you relax. Return to your normal activities when your doctor says that it is safe. General instructions Do not take baths, swim, or use a hot tub. Ask your doctor about taking showers or sponge baths. Hold a pillow over your belly when you cough or sneeze. This helps with pain. Do not smoke or use any  products that contain nicotine or tobacco. If you need help quitting, ask your doctor. Keep all follow-up visits. Contact a doctor if: You have any of these signs of infection in or around your incision: More redness, swelling, or pain. More fluid or blood. Warmth. Pus. A bad smell. You have a fever or chills. You have blood in your poop (stool). You have not pooped (had a bowel movement) in 2-3 days. Medicine does not help your pain. Get help right away if: You have chest pain, or you are short of breath. You feel faint or light-headed. You have very bad pain. You vomit and your pain is worse. You have pain, swelling, or redness in a leg. These symptoms may be an emergency. Get help right away. Call your local emergency services (911 in the [place] Do not wait to see if the symptoms will go away. Do not drive yourself to the hospital. Summary After this procedure, it is common to have mild discomfort, slight bruising, and mild swelling. Follow instructions from your doctor about how to take care of your cut from surgery (incision). Check every day for signs of infection. Do not lift heavy objects or play contact sports until your doctor says it is safe. Return to your normal activities as told by your doctor. This information is not intended to replace advice given to you by your health care provider. Make sure you discuss any questions you have with your health care provider. Document Revised: 10/26/2019 Document Reviewed: 10/26/2019 Elsevier Patient Education  [place] Anesthesia, Adult, Care After The following information offers guidance on how to care for yourself after your procedure. Your health care provider may also give you more specific instructions. If you have problems or questions, contact your health care provider. What can I expect after the procedure? After the procedure, it is common for people to: Have pain or discomfort at the IV site. Have  nausea or vomiting. Have a sore throat or hoarseness. Have trouble concentrating. Feel cold or chills. Feel weak, sleepy, or tired (fatigue). Have soreness and body aches. These can affect parts of the body that were not involved in surgery. Follow these instructions at home: For the time period you were told by your health care provider:  Rest. Do not participate in activities where you could fall or become injured. Do not drive or use machinery. Do not drink alcohol. Do not take sleeping pills or medicines that cause drowsiness. Do not make important decisions or sign legal documents. Do not take care of children on your own. General instructions Drink enough fluid to keep your urine pale yellow. If you have sleep apnea, surgery and certain medicines can increase your risk for breathing  problems. Follow instructions from your health care provider about wearing your sleep device: Anytime you are sleeping, including during daytime naps. While taking prescription pain medicines, sleeping medicines, or medicines that make you drowsy. Return to your normal activities as told by your health care provider. Ask your health care provider what activities are safe for you. Take over-the-counter and prescription medicines only as told by your health care provider. Do not use any products that contain nicotine or tobacco. These products include cigarettes, chewing tobacco, and vaping devices, such as e-cigarettes. These can delay incision healing after surgery. If you need help quitting, ask your health care provider. Contact a health care provider if: You have nausea or vomiting that does not get better with medicine. You vomit every time you eat or drink. You have pain that does not get better with medicine. You cannot urinate or have bloody urine. You develop a skin rash. You have a fever. Get help right away if: You have trouble breathing. You have chest pain. You vomit blood. These  symptoms may be an emergency. Get help right away. Call 911. Do not wait to see if the symptoms will go away. Do not drive yourself to the hospital. Summary After the procedure, it is common to have a sore throat, hoarseness, nausea, vomiting, or to feel weak, sleepy, or fatigue. For the time period you were told by your health care provider, do not drive or use machinery. Get help right away if you have difficulty breathing, have chest pain, or vomit blood. These symptoms may be an emergency. This information is not intended to replace advice given to you by your health care provider. Make sure you discuss any questions you have with your health care provider. Document Revised: 06/09/2021 Document Reviewed: 06/09/2021 Elsevier Patient Education  [place]. How to Use Chlorhexidine Before Surgery Chlorhexidine gluconate (CHG) is a germ-killing (antiseptic) solution that is used to clean the skin. It can get rid of the bacteria that normally live on the skin and can keep them away for about 24 hours. To clean your skin with CHG, you may be given: A CHG solution to use in the shower or as part of a sponge bath. A prepackaged cloth that contains CHG. Cleaning your skin with CHG may help lower the risk for infection: While you are staying in the intensive care unit of the hospital. If you have a vascular access, such as a central line, to provide short-term or long-term access to your veins. If you have a catheter to drain urine from your bladder. If you are on a ventilator. A ventilator is a machine that helps you breathe by moving air in and out of your lungs. After surgery. What are the risks? Risks of using CHG include: A skin reaction. Hearing loss, if CHG gets in your ears and you have a perforated eardrum. Eye injury, if CHG gets in your eyes and is not rinsed out. The CHG product catching fire. Make sure that you avoid smoking and flames after applying CHG to your skin. Do not  use CHG: If you have a chlorhexidine allergy or have previously reacted to chlorhexidine. On babies younger than 59 months of age. How to use CHG solution Use CHG only as told by your health care provider, and follow the instructions on the label. Use the full amount of CHG as directed. Usually, this is one bottle. During a shower Follow these steps when using CHG solution during a shower (unless your health  care provider gives you different instructions): Start the shower. Use your normal soap and shampoo to wash your face and hair. Turn off the shower or move out of the shower stream. Pour the CHG onto a clean washcloth. Do not use any type of brush or rough-edged sponge. Starting at your neck, lather your body down to your toes. Make sure you follow these instructions: If you will be having surgery, pay special attention to the part of your body where you will be having surgery. Scrub this area for at least 1 minute. Do not use CHG on your head or face. If the solution gets into your ears or eyes, rinse them well with water. Avoid your genital area. Avoid any areas of skin that have broken skin, cuts, or scrapes. Scrub your back and under your arms. Make sure to wash skin folds. Let the lather sit on your skin for 1-2 minutes or as long as told by your health care provider. Thoroughly rinse your entire body in the shower. Make sure that all body creases and crevices are rinsed well. Dry off with a clean towel. Do not put any substances on your body afterward--such as powder, lotion, or perfume--unless you are told to do so by your health care provider. Only use lotions that are recommended by the manufacturer. Put on clean clothes or pajamas. If it is the night before your surgery, sleep in clean sheets.  During a sponge bath Follow these steps when using CHG solution during a sponge bath (unless your health care provider gives you different instructions): Use your normal soap and shampoo  to wash your face and hair. Pour the CHG onto a clean washcloth. Starting at your neck, lather your body down to your toes. Make sure you follow these instructions: If you will be having surgery, pay special attention to the part of your body where you will be having surgery. Scrub this area for at least 1 minute. Do not use CHG on your head or face. If the solution gets into your ears or eyes, rinse them well with water. Avoid your genital area. Avoid any areas of skin that have broken skin, cuts, or scrapes. Scrub your back and under your arms. Make sure to wash skin folds. Let the lather sit on your skin for 1-2 minutes or as long as told by your health care provider. Using a different clean, wet washcloth, thoroughly rinse your entire body. Make sure that all body creases and crevices are rinsed well. Dry off with a clean towel. Do not put any substances on your body afterward--such as powder, lotion, or perfume--unless you are told to do so by your health care provider. Only use lotions that are recommended by the manufacturer. Put on clean clothes or pajamas. If it is the night before your surgery, sleep in clean sheets. How to use CHG prepackaged cloths Only use CHG cloths as told by your health care provider, and follow the instructions on the label. Use the CHG cloth on clean, dry skin. Do not use the CHG cloth on your head or face unless your health care provider tells you to. When washing with the CHG cloth: Avoid your genital area. Avoid any areas of skin that have broken skin, cuts, or scrapes. Before surgery Follow these steps when using a CHG cloth to clean before surgery (unless your health care provider gives you different instructions): Using the CHG cloth, vigorously scrub the part of your body where you will be having surgery.  Scrub using a back-and-forth motion for 3 minutes. The area on your body should be completely wet with CHG when you are done scrubbing. Do not rinse.  Discard the cloth and let the area air-dry. Do not put any substances on the area afterward, such as powder, lotion, or perfume. Put on clean clothes or pajamas. If it is the night before your surgery, sleep in clean sheets.  For general bathing Follow these steps when using CHG cloths for general bathing (unless your health care provider gives you different instructions). Use a separate CHG cloth for each area of your body. Make sure you wash between any folds of skin and between your fingers and toes. Wash your body in the following order, switching to a new cloth after each step: The front of your neck, shoulders, and chest. Both of your arms, under your arms, and your hands. Your stomach and groin area, avoiding the genitals. Your right leg and foot. Your left leg and foot. The back of your neck, your back, and your buttocks. Do not rinse. Discard the cloth and let the area air-dry. Do not put any substances on your body afterward--such as powder, lotion, or perfume--unless you are told to do so by your health care provider. Only use lotions that are recommended by the manufacturer. Put on clean clothes or pajamas. Contact a health care provider if: Your skin gets irritated after scrubbing. You have questions about using your solution or cloth. You swallow any chlorhexidine. Call your local poison control center (1-(765)446-6256 in the [place] Get help right away if: Your eyes itch badly, or they become very red or swollen. Your skin itches badly and is red or swollen. Your hearing changes. You have trouble seeing. You have swelling or tingling in your mouth or throat. You have trouble breathing. These symptoms may represent a serious problem that is an emergency. Do not wait to see if the symptoms will go away. Get medical help right away. Call your local emergency services (911 in the [place] Do not drive yourself to the hospital. Summary Chlorhexidine gluconate (CHG) is a germ-killing  (antiseptic) solution that is used to clean the skin. Cleaning your skin with CHG may help to lower your risk for infection. You may be given CHG to use for bathing. It may be in a bottle or in a prepackaged cloth to use on your skin. Carefully follow your health care provider's instructions and the instructions on the product label. Do not use CHG if you have a chlorhexidine allergy. Contact your health care provider if your skin gets irritated after scrubbing. This information is not intended to replace advice given to you by your health care provider. Make sure you discuss any questions you have with your health care provider. Document Revised: 07/10/2021 Document Reviewed: 05/23/2020 Elsevier Patient Education  [place].

## 2022-02-01 ENCOUNTER — Encounter (HOSPITAL_COMMUNITY)
Admission: RE | Admit: 2022-02-01 | Discharge: 2022-02-01 | Disposition: A | Discharge status: Home / Self Care | Payer: PPO | Source: Ambulatory Visit | Attending: Surgery | Admitting: Surgery

## 2022-02-01 ENCOUNTER — Encounter (HOSPITAL_COMMUNITY): Payer: Self-pay

## 2022-02-01 ENCOUNTER — Other Ambulatory Visit: Payer: Self-pay

## 2022-02-01 VITALS — BP 114/54 | HR 57 | Temp 98.2°F | Resp 18 | Ht 66.0 in | Wt 147.0 lb

## 2022-02-01 DIAGNOSIS — Z01812 Encounter for preprocedural laboratory examination: Secondary | ICD-10-CM | POA: Diagnosis not present

## 2022-02-01 DIAGNOSIS — E119 Type 2 diabetes mellitus without complications: Secondary | ICD-10-CM | POA: Insufficient documentation

## 2022-02-01 HISTORY — DX: Cerebral infarction, unspecified: I63.9

## 2022-02-01 LAB — BASIC METABOLIC PANEL
Anion gap: 8 (ref 5–15)
BUN: 32 mg/dL — ABNORMAL HIGH (ref 8–23)
CO2: 21 mmol/L — ABNORMAL LOW (ref 22–32)
Calcium: 8.7 mg/dL — ABNORMAL LOW (ref 8.9–10.3)
Chloride: 107 mmol/L (ref 98–111)
Creatinine, Ser: 1.74 mg/dL — ABNORMAL HIGH (ref 0.61–1.24)
GFR, Estimated: 40 mL/min — ABNORMAL LOW (ref >60)
Glucose, Bld: 257 mg/dL — ABNORMAL HIGH (ref 70–99)
Potassium: 3.9 mmol/L (ref 3.5–5.1)
Sodium: 136 mmol/L (ref 135–145)

## 2022-02-01 LAB — HEMOGLOBIN A1C
Hgb A1c MFr Bld: 6 % — ABNORMAL HIGH (ref 4.8–5.6)
Mean Plasma Glucose: 125.5 mg/dL

## 2022-02-05 ENCOUNTER — Other Ambulatory Visit: Payer: Self-pay

## 2022-02-05 ENCOUNTER — Ambulatory Visit (HOSPITAL_COMMUNITY)
Admission: RE | Admit: 2022-02-05 | Discharge: 2022-02-05 | Disposition: A | Discharge status: Home / Self Care | Payer: PPO | Attending: Surgery | Admitting: Surgery

## 2022-02-05 ENCOUNTER — Ambulatory Visit (HOSPITAL_COMMUNITY): Payer: PPO | Admitting: Anesthesiology

## 2022-02-05 ENCOUNTER — Ambulatory Visit (HOSPITAL_BASED_OUTPATIENT_CLINIC_OR_DEPARTMENT_OTHER): Payer: PPO | Admitting: Anesthesiology

## 2022-02-05 ENCOUNTER — Encounter (HOSPITAL_COMMUNITY)
Admission: RE | Disposition: A | Discharge status: Home / Self Care | Payer: Self-pay | Source: Home / Self Care | Attending: Surgery

## 2022-02-05 ENCOUNTER — Encounter (HOSPITAL_COMMUNITY): Payer: Self-pay | Admitting: Surgery

## 2022-02-05 DIAGNOSIS — E1151 Type 2 diabetes mellitus with diabetic peripheral angiopathy without gangrene: Secondary | ICD-10-CM

## 2022-02-05 DIAGNOSIS — K5900 Constipation, unspecified: Secondary | ICD-10-CM | POA: Insufficient documentation

## 2022-02-05 DIAGNOSIS — Z87891 Personal history of nicotine dependence: Secondary | ICD-10-CM | POA: Diagnosis not present

## 2022-02-05 DIAGNOSIS — Z7984 Long term (current) use of oral hypoglycemic drugs: Secondary | ICD-10-CM

## 2022-02-05 DIAGNOSIS — E119 Type 2 diabetes mellitus without complications: Secondary | ICD-10-CM | POA: Insufficient documentation

## 2022-02-05 DIAGNOSIS — K4090 Unilateral inguinal hernia, without obstruction or gangrene, not specified as recurrent: Secondary | ICD-10-CM | POA: Diagnosis not present

## 2022-02-05 DIAGNOSIS — E785 Hyperlipidemia, unspecified: Secondary | ICD-10-CM | POA: Diagnosis not present

## 2022-02-05 DIAGNOSIS — I10 Essential (primary) hypertension: Secondary | ICD-10-CM

## 2022-02-05 HISTORY — PX: INGUINAL HERNIA REPAIR: SHX194

## 2022-02-05 LAB — GLUCOSE, CAPILLARY
Glucose-Capillary: 131 mg/dL — ABNORMAL HIGH (ref 70–99)
Glucose-Capillary: 107 mg/dL — ABNORMAL HIGH (ref 70–99)

## 2022-02-05 SURGERY — REPAIR, HERNIA, INGUINAL, ADULT
Anesthesia: General | Site: Perineum | Laterality: Right

## 2022-02-05 MED ORDER — SODIUM CHLORIDE 0.9 % IR SOLN
Status: DC | PRN
  Administered 2022-02-05: 1000 mL

## 2022-02-05 MED ORDER — CEFAZOLIN SODIUM-DEXTROSE 2-4 GM/100ML-% IV SOLN
2.0000 g | INTRAVENOUS | Status: AC
Start: 2022-02-05 — End: 2022-02-05
  Administered 2022-02-05: 2 g via INTRAVENOUS

## 2022-02-05 MED ORDER — PROPOFOL 10 MG/ML IV BOLUS
INTRAVENOUS | Status: DC | PRN
  Administered 2022-02-05: 120 mg via INTRAVENOUS

## 2022-02-05 MED ORDER — OXYCODONE HCL 5 MG/5ML PO SOLN: 5.0000 mg | Freq: Once | ORAL | Status: DC | PRN

## 2022-02-05 MED ORDER — CHLORHEXIDINE GLUCONATE CLOTH 2 % EX PADS: 6.0000 | MEDICATED_PAD | Freq: Once | CUTANEOUS | Status: DC

## 2022-02-05 MED ORDER — OXYCODONE HCL 5 MG PO TABS: 5.0000 mg | ORAL_TABLET | Freq: Four times a day (QID) | ORAL | 0 refills | Status: AC | PRN

## 2022-02-05 MED ORDER — SUGAMMADEX SODIUM 500 MG/5ML IV SOLN
INTRAVENOUS | Status: DC | PRN
  Administered 2022-02-05: 200 mg via INTRAVENOUS

## 2022-02-05 MED ORDER — BUPIVACAINE HCL (300 MG DOSE) 3 X 100 MG IL IMPL
DRUG_IMPLANT | Status: DC | PRN
  Administered 2022-02-05: 300 mg

## 2022-02-05 MED ORDER — CEFAZOLIN SODIUM-DEXTROSE 2-4 GM/100ML-% IV SOLN
INTRAVENOUS | Status: AC
  Filled 2022-02-05: qty 100

## 2022-02-05 MED ORDER — FENTANYL CITRATE (PF) 100 MCG/2ML IJ SOLN
INTRAMUSCULAR | Status: AC
  Filled 2022-02-05: qty 2

## 2022-02-05 MED ORDER — GLYCOPYRROLATE 0.2 MG/ML IJ SOLN
INTRAMUSCULAR | Status: DC | PRN
  Administered 2022-02-05: .2 mg via INTRAVENOUS

## 2022-02-05 MED ORDER — LIDOCAINE HCL (CARDIAC) PF 100 MG/5ML IV SOSY
PREFILLED_SYRINGE | INTRAVENOUS | Status: DC | PRN
  Administered 2022-02-05: 100 mg via INTRAVENOUS

## 2022-02-05 MED ORDER — ACETAMINOPHEN ER 650 MG PO TBCR: 650.0000 mg | EXTENDED_RELEASE_TABLET | Freq: Four times a day (QID) | ORAL | 0 refills | Status: AC

## 2022-02-05 MED ORDER — BUPIVACAINE HCL (300 MG DOSE) 3 X 100 MG IL IMPL
DRUG_IMPLANT | Status: AC
  Filled 2022-02-05: qty 100

## 2022-02-05 MED ORDER — ONDANSETRON HCL 4 MG/2ML IJ SOLN
INTRAMUSCULAR | Status: DC | PRN
  Administered 2022-02-05: 4 mg via INTRAVENOUS

## 2022-02-05 MED ORDER — LACTATED RINGERS IV SOLN: INTRAVENOUS | Status: DC | PRN

## 2022-02-05 MED ORDER — PROPOFOL 10 MG/ML IV BOLUS
INTRAVENOUS | Status: AC
  Filled 2022-02-05: qty 20

## 2022-02-05 MED ORDER — ONDANSETRON HCL 4 MG/2ML IJ SOLN: 4.0000 mg | Freq: Once | INTRAMUSCULAR | Status: DC | PRN

## 2022-02-05 MED ORDER — ROCURONIUM BROMIDE 100 MG/10ML IV SOLN
INTRAVENOUS | Status: DC | PRN
  Administered 2022-02-05: 50 mg via INTRAVENOUS

## 2022-02-05 MED ORDER — DOCUSATE SODIUM 100 MG PO CAPS: 100.0000 mg | ORAL_CAPSULE | Freq: Two times a day (BID) | ORAL | 2 refills | Status: AC

## 2022-02-05 MED ORDER — CHLORHEXIDINE GLUCONATE 0.12 % MT SOLN
OROMUCOSAL | Status: AC
  Filled 2022-02-05: qty 15

## 2022-02-05 MED ORDER — FENTANYL CITRATE (PF) 100 MCG/2ML IJ SOLN
INTRAMUSCULAR | Status: DC | PRN
  Administered 2022-02-05: 100 ug via INTRAVENOUS
  Administered 2022-02-05 (×2): 50 ug via INTRAVENOUS

## 2022-02-05 MED ORDER — FENTANYL CITRATE PF 50 MCG/ML IJ SOSY: 25.0000 ug | PREFILLED_SYRINGE | INTRAMUSCULAR | Status: DC | PRN

## 2022-02-05 MED ORDER — DEXAMETHASONE SODIUM PHOSPHATE 10 MG/ML IJ SOLN
INTRAMUSCULAR | Status: DC | PRN
  Administered 2022-02-05: 6 mg via INTRAVENOUS

## 2022-02-05 MED ORDER — OXYCODONE HCL 5 MG PO TABS: 5.0000 mg | ORAL_TABLET | Freq: Once | ORAL | Status: DC | PRN

## 2022-02-05 MED ORDER — EPHEDRINE SULFATE (PRESSORS) 50 MG/ML IJ SOLN
INTRAMUSCULAR | Status: DC | PRN
  Administered 2022-02-05: 10 mg via INTRAVENOUS

## 2022-02-05 SURGICAL SUPPLY — 30 items
ADH SKN CLS APL DERMABOND .7 (GAUZE/BANDAGES/DRESSINGS) ×1
CLOTH BEACON ORANGE TIMEOUT ST (SAFETY) ×2 IMPLANT
COVER LIGHT HANDLE STERIS (MISCELLANEOUS) ×4 IMPLANT
DERMABOND ADVANCED .7 DNX12 (GAUZE/BANDAGES/DRESSINGS) ×2 IMPLANT
DRAIN PENROSE 0.5X18 (DRAIN) ×2 IMPLANT
ELECT REM PT RETURN 9FT ADLT (ELECTROSURGICAL) ×1
ELECTRODE REM PT RTRN 9FT ADLT (ELECTROSURGICAL) ×2 IMPLANT
GAUZE SPONGE 4X4 12PLY STRL (GAUZE/BANDAGES/DRESSINGS) ×2 IMPLANT
GLOVE BIOGEL PI IND STRL 6.5 (GLOVE) ×2 IMPLANT
GLOVE BIOGEL PI IND STRL 7.0 (GLOVE) ×4 IMPLANT
GLOVE ECLIPSE 6.5 STRL STRAW (GLOVE) IMPLANT
GLOVE SURG SS PI 6.5 STRL IVOR (GLOVE) ×4 IMPLANT
GLOVE SURG SS PI 7.0 STRL IVOR (GLOVE) IMPLANT
GOWN STRL REUS W/TWL LRG LVL3 (GOWN DISPOSABLE) ×6 IMPLANT
INST SET MINOR GENERAL (KITS) ×2 IMPLANT
KIT TURNOVER KIT A (KITS) ×2 IMPLANT
MANIFOLD NEPTUNE II (INSTRUMENTS) ×2 IMPLANT
MESH PRE-SHAPE KEYHOLE 1.8X4 (Mesh General) IMPLANT
NS IRRIG 1000ML POUR BTL (IV SOLUTION) ×2 IMPLANT
PACK MINOR (CUSTOM PROCEDURE TRAY) ×2 IMPLANT
PAD ARMBOARD 7.5X6 YLW CONV (MISCELLANEOUS) ×2 IMPLANT
SET BASIN LINEN APH (SET/KITS/TRAYS/PACK) ×2 IMPLANT
SOL PREP PROV IODINE SCRUB 4OZ (MISCELLANEOUS) ×2 IMPLANT
SUT MNCRL AB 4-0 PS2 18 (SUTURE) ×2 IMPLANT
SUT NOVA NAB GS-22 2 2-0 T-19 (SUTURE) IMPLANT
SUT SILK 2 0 FSL 18 (SUTURE) IMPLANT
SUT VIC AB 2-0 CT1 27 (SUTURE) ×1
SUT VIC AB 2-0 CT1 TAPERPNT 27 (SUTURE) IMPLANT
SUT VIC AB 3-0 SH 27 (SUTURE) ×3
SUT VIC AB 3-0 SH 27X BRD (SUTURE) ×2 IMPLANT

## 2022-02-05 NOTE — Discharge Instructions (Signed)
Ambulatory Surgery Discharge Instructions  General Anesthesia or Sedation Do not drive or operate heavy machinery for 24 hours.  Do not consume alcohol, tranquilizers, sleeping medications, or any non-prescribed medications for 24 hours. Do not make important decisions or sign any important papers in the next 24 hours. You should have someone with you tonight at home.  Activity  You are advised to go directly home from the hospital.  Restrict your activities and rest for a day.  Resume light activity tomorrow. No heavy lifting over 10 lbs or strenuous exercise.  Fluids and Diet Begin with clear liquids, bouillon, dry toast, soda crackers.  If not nauseated, you may go to a regular diet when you desire.  Greasy and spicy foods are not advised.  Medications  If you have not had a bowel movement in 24 hours, take 2 tablespoons over the counter Milk of mag.             You May resume your blood thinners tomorrow (Aspirin, coumadin, or other).  You are being discharged with prescriptions for Opioid/Narcotic Medications: There are some specific considerations for these medications that you should know. Opioid Meds have risks & benefits. Addiction to these meds is always a concern with prolonged use Take medication only as directed Do not drive while taking narcotic pain medication Do not crush tablets or capsules Do not use a different container than medication was dispensed in Lock the container of medication in a cool, dry place out of reach of children and pets. Opioid medication can cause addiction Do not share with anyone else (this is a felony) Do not store medications for future use. Dispose of them properly.     Disposal:  Find a  household drug take back site near you.  If you can't get to a drug take back site, use the recipe below as a last resort to dispose of expired, unused or unwanted drugs. Disposal  (Do not dispose chemotherapy drugs this way, talk to your  prescribing doctor instead.) Step 1: Mix drugs (do not crush) with dirt, kitty litter, or used coffee grounds and add a small amount of water to dissolve any solid medications. Step 2: Seal drugs in plastic bag. Step 3: Place plastic bag in trash. Step 4: Take prescription container and scratch out personal information, then recycle or throw away.  Operative Site  You have a liquid bandage over your incisions, this will begin to flake off in about a week. Ok to shower tomorrow. Keep wound clean and dry. No baths or swimming. No lifting more than 10 pounds.  Contact Information: If you have questions or concerns, please call our office, [phone], Monday- Thursday 8AM-5PM and Friday 8AM-12Noon.  If it is after hours or on the weekend, please call [facility] Main Number, [phone], and ask to speak to the surgeon on call for Dr. Toma Arts at [place].   SPECIFIC COMPLICATIONS TO WATCH FOR: Inability to urinate Fever over 101? F by mouth Nausea and vomiting lasting longer than 24 hours. Pain not relieved by medication ordered Swelling around the operative site Increased redness, warmth, hardness, around operative area Numbness, tingling, or cold fingers or toes Blood -soaked dressing, (small amounts of oozing may be normal) Increasing and progressive drainage from surgical area or exam site  

## 2022-02-05 NOTE — Transfer of Care (Signed)
Immediate Anesthesia Transfer of Care Note  Patient: Peter York  Procedure(s) Performed: HERNIA REPAIR INGUINAL ADULT W/ MESH (Right: Perineum)  Patient Location: PACU  Anesthesia Type:General  Level of Consciousness: awake, alert , oriented, and patient cooperative  Airway & Oxygen Therapy: Patient Spontanous Breathing and Patient connected to face mask oxygen  Post-op Assessment: Report given to RN, Post -op Vital signs reviewed and stable, and Patient moving all extremities X 4  Post vital signs: Reviewed and stable  Last Vitals:  Vitals Value Taken Time  BP 139/74 02/05/22 1340  Temp    Pulse 71 02/05/22 1344  Resp 18 02/05/22 1344  SpO2 98 % 02/05/22 1344  Vitals shown include unvalidated device data.  Last Pain:  Vitals:   02/05/22 0817  TempSrc: Oral  PainSc: 0-No pain         Complications: No notable events documented.

## 2022-02-05 NOTE — Anesthesia Preprocedure Evaluation (Signed)
Anesthesia Evaluation  Patient identified by MRN, date of birth, ID band Patient awake    Reviewed: Allergy & Precautions, H&P , NPO status , Patient's Chart, lab work & pertinent test results, reviewed documented beta blocker date and time   Airway Mallampati: II  TM Distance: >3 FB Neck ROM: full    Dental no notable dental hx.    Pulmonary neg pulmonary ROS, former smoker   Pulmonary exam normal breath sounds clear to auscultation       Cardiovascular Exercise Tolerance: Good hypertension, + Peripheral Vascular Disease   Rhythm:regular Rate:Normal     Neuro/Psych negative neurological ROS  negative psych ROS   GI/Hepatic negative GI ROS, Neg liver ROS,,,  Endo/Other  negative endocrine ROSdiabetes    Renal/GU negative Renal ROS  negative genitourinary   Musculoskeletal   Abdominal   Peds  Hematology negative hematology ROS (+)   Anesthesia Other Findings   Reproductive/Obstetrics negative OB ROS                             Anesthesia Physical Anesthesia Plan  ASA: 3  Anesthesia Plan: General and General ETT   Post-op Pain Management:    Induction:   PONV Risk Score and Plan: Ondansetron  Airway Management Planned:   Additional Equipment:   Intra-op Plan:   Post-operative Plan:   Informed Consent: I have reviewed the patients History and Physical, chart, labs and discussed the procedure including the risks, benefits and alternatives for the proposed anesthesia with the patient or authorized representative who has indicated his/her understanding and acceptance.     Dental Advisory Given  Plan Discussed with: CRNA  Anesthesia Plan Comments:        Anesthesia Quick Evaluation

## 2022-02-05 NOTE — Anesthesia Postprocedure Evaluation (Signed)
Anesthesia Post Note  Patient: Peter York  Procedure(s) Performed: HERNIA REPAIR INGUINAL ADULT W/ MESH (Right: Perineum)  Patient location during evaluation: Phase II Anesthesia Type: General Level of consciousness: awake Pain management: pain level controlled Vital Signs Assessment: post-procedure vital signs reviewed and stable Respiratory status: spontaneous breathing and respiratory function stable Cardiovascular status: blood pressure returned to baseline and stable Postop Assessment: no headache and no apparent nausea or vomiting Anesthetic complications: no Comments: Late entry   No notable events documented.   Last Vitals:  Vitals:   02/05/22 1400 02/05/22 1411  BP: 126/66 (!) 112/55  Pulse: 67 65  Resp: 14 16  Temp: 36.7 C (!) 36.3 C  SpO2: 93% 93%    Last Pain:  Vitals:   02/05/22 1411  TempSrc: Oral  PainSc: 0-No pain                 Louann Sjogren

## 2022-02-05 NOTE — Anesthesia Procedure Notes (Signed)
Procedure Name: Intubation Date/Time: 02/05/2022 12:04 PM  Performed by: Jonna Munro, CRNAPre-anesthesia Checklist: Patient identified, Emergency Drugs available, Suction available, Timeout performed and Patient being monitored Patient Re-evaluated:Patient Re-evaluated prior to induction Oxygen Delivery Method: Circle system utilized Preoxygenation: Pre-oxygenation with 100% oxygen Induction Type: IV induction Laryngoscope Size: Mac and 3 Grade View: Grade I Tube type: Oral Tube size: 7.0 mm Number of attempts: 1 Airway Equipment and Method: Stylet Placement Confirmation: ETT inserted through vocal cords under direct vision, positive ETCO2, CO2 detector and breath sounds checked- equal and bilateral Secured at: 22 cm Tube secured with: Tape Dental Injury: Teeth and Oropharynx as per pre-operative assessment

## 2022-02-05 NOTE — Interval H&P Note (Signed)
History and Physical Interval Note:  02/05/2022 9:49 AM  Peter York  has presented today for surgery, with the diagnosis of RIGHT INGUINAL HERNIA.  The various methods of treatment have been discussed with the patient and family. After consideration of risks, benefits and other options for treatment, the patient has consented to  Procedure(s): HERNIA REPAIR INGUINAL ADULT W/ MESH (Right) as a surgical intervention.  The patient's history has been reviewed, patient examined, no change in status, stable for surgery.  I have reviewed the patient's chart and labs.  Questions were answered to the patient's satisfaction.     [place]

## 2022-02-05 NOTE — Progress Notes (Signed)
Call to wife at [phone] to advise that patient continues to wait in preoperative unit.

## 2022-02-05 NOTE — Op Note (Signed)
[facility] Operative Note  02/05/22  Preoperative Diagnosis: Right inguinal hernia    Postoperative Diagnosis: Same   Procedure(s) Performed: Right inguinal hernia repair with mesh   Surgeon: Graciella Freer, DO    Assistants: Cecilio Asper, RNFA   Anesthesia: General endotracheal   Anesthesiologist: Louann Sjogren, MD    Specimens: None   Estimated Blood Loss: Minimal   Blood Replacement: None    Complications: None   Wound Class:  Clean   Operative Indications: Patient is a 76 year old male who presents for open right inguinal hernia repair with mesh.  He has had this hernia for 6 months, and would like to get it repaired at this time.  He is agreeable to surgery.  All risks and benefits of performing this procedure were discussed with the patient including pain, infection, bleeding, damage to the surrounding structures, and need for more procedures or surgery. The patient voiced understanding of the procedure, all questions were sought and answered, and consent was obtained.  Findings: Indirect right inguinal hernia with weakening of the inguinal floor   Procedure: The patient was taken to the operating room and placed supine. General endotracheal anesthesia was induced. Intravenous antibiotics were administered per protocol.  A time out was preformed verifying the correct patient, procedure, site, positioning and implants.  The right groin and scrotum were prepared and draped in the usual sterile fashion.   An incision was made in a natural skin crease between the pubic tubercle and the anterior superior iliac spine.  The incision was deepened with electrocautery through Scarpa's and Camper's fascia until the aponeurosis of the external oblique was encountered.  This was cleaned and the external ring was exposed.  An incision was made in the midportion of the external oblique aponeurosis in the direction of its fibers. The ilioinguinal nerve was not  identified.  Flaps of the external oblique were developed cephalad and inferiorly.    The cord was identified and it was gently dissented free at the pubic tubercle and encircled with a Penrose drain.  Attention was then directed at the anteromedial aspect of the cord, where an indirect hernia sac was identified.  The sac was carefully dissected free from the cord down to the level of the internal ring.  The vas and testicular vessels were identified and protected from harm.  Once the sac was dissected free from the cords, the Penrose was placed around the cord which was retracted inferiorly out of the field of view.  The sac was entered without any intra-abdominal contents noted.  The hernia sac was suture ligated closed. The hernia was reduced into the internal ring without difficulty.  Attention was then turned to the floor of the canal, which was grossly weakened without any defined defect or sac. The Bard Keyhole Mesh was sutured to the inguinal ligament inferiorly starting at the pubic tubercle using 2-0 Novafil interrupted sutures.  The mesh was sutured superiorly to the conjoint tendon using 2-0 Novafil interrupted sutures.  Care was taken to ensure the mesh was placed in a relaxed fashion to avoid excessive tension and no neurovascular structures were caught in the repair.  Laterally the tails of the mesh were crossed and the internal ring was recreated, allowing for passage of cords without tension.   Hemostasis was adequate.  The Penrose was removed.  The external oblique aponeurosis was closed with a 2-0 Vicryl suture in a running fashion, taking care to not catch the ilioinguinal nerve in the suture  line.  Scarpa's fashion was closed with 3-0 Vicryl suture in a running fashion.  The dermis was closed with interrupted 3-0 Vicryl sutures. The skin was closed with a subcuticular 4-0 Monocryl suture.  Dermabond was applied.  Xaracoll was inserted within all layers of closure.   The testis was gently  pulled down into its anatomic position in the scrotum.  The patient tolerated the procedure well and was taken to the PACU in stable condition. All counts were correct at the end of the case.     Graciella Freer, DO  [facility] [address] [place], [place] [address] [phone] (office)

## 2022-02-05 NOTE — Progress Notes (Signed)
[facility]  Spoke with the patient's daughter and wife in the consultation room.  Explained that he tolerated the procedure well and we were able to repair his hernia without issue.  He has dissolvable stitches under the skin with overlying skin glue.  The skin glue will flake off in 10 to 14 days.  He will be discharged home with a prescription for narcotic pain medication, which she should take as needed for pain.  If he is taking this, he should take a stool softener with it.  He should also take scheduled Tylenol for the next week.  He may ice his groin as needed for pain and swelling.  No lifting more than 10 pounds for the next 4 to 6 weeks.  He will follow-up with me in 2 weeks.  All questions were answered to their expressed satisfaction.  Graciella Freer, DO [facility] [address] [place], [place] [address] [phone] (office)

## 2022-02-06 DIAGNOSIS — K4090 Unilateral inguinal hernia, without obstruction or gangrene, not specified as recurrent: Secondary | ICD-10-CM

## 2022-02-12 ENCOUNTER — Encounter (HOSPITAL_COMMUNITY): Payer: Self-pay | Admitting: Surgery

## 2022-02-20 ENCOUNTER — Encounter: Payer: Self-pay | Admitting: Surgery

## 2022-02-20 ENCOUNTER — Ambulatory Visit (INDEPENDENT_AMBULATORY_CARE_PROVIDER_SITE_OTHER): Payer: PPO | Admitting: Surgery

## 2022-02-20 VITALS — BP 129/72 | HR 56 | Temp 98.1°F | Resp 14 | Ht 66.0 in | Wt 152.0 lb

## 2022-02-20 DIAGNOSIS — Z09 Encounter for follow-up examination after completed treatment for conditions other than malignant neoplasm: Secondary | ICD-10-CM

## 2022-02-20 NOTE — Progress Notes (Unsigned)
[facility] Note   HPI:  76 y.o. Male presents to clinic for post-op follow-up status post open right inguinal hernia repair with mesh on 11/13.  He states that he has been doing well since his surgery.  He initially had the pain a couple days after surgery, but that is subsequently resolved.  He is not currently taking anything for pain.  He is eating well without nausea and vomiting.  He is moving his bowels without difficulty.  He denies fevers and chills.  Review of Systems:  All other review of systems: otherwise negative   Vital Signs:  BP 129/72   Pulse (!) 56   Temp 98.1 F (36.7 C) (Oral)   Resp 14   Ht '5\' 6"'$  (1.676 m)   Wt 152 lb (68.9 kg)   SpO2 97%   BMI 24.53 kg/m    Physical Exam:  Physical Exam Vitals reviewed.  Constitutional:      Appearance: Normal appearance.  Genitourinary:    Comments: Right groin incision healing well with overlying Dermabond in place, minimal edema, no erythema; nontender Neurological:     Mental Status: He is alert.    Laboratory studies: None  Imaging:  None   Assessment:  76 y.o. yo Male who presents for follow-up status post open right inguinal hernia repair with mesh on 11/13  Plan:  -Patient has been doing very well since the surgery.  His pain is well controlled, tolerating a diet, moving his bowels -Advised him that the hardness under his incision site will continue to soften with time, as this is just scar tissue -He may use antibiotic ointment over his incision prior to showering to get off any remaining skin glue -Advised him that he should continue to hold off on heavy lifting for the next 2 to 4 weeks -Follow up as needed  All of the above recommendations were discussed with the patient, and all of patient's questions were answered to his expressed satisfaction.  Graciella Freer, DO [facility] [address] [place], [place] [address] [phone] (office)

## 2022-03-12 ENCOUNTER — Ambulatory Visit (INDEPENDENT_AMBULATORY_CARE_PROVIDER_SITE_OTHER): Payer: PPO

## 2022-03-12 VITALS — Ht 66.0 in | Wt 149.0 lb

## 2022-03-12 DIAGNOSIS — Z0000 Encounter for general adult medical examination without abnormal findings: Secondary | ICD-10-CM | POA: Diagnosis not present

## 2022-03-12 NOTE — Patient Instructions (Signed)
Peter York , Thank you for taking time to come for your Medicare Wellness Visit. I appreciate your ongoing commitment to your health goals. Please review the following plan we discussed and let me know if I can assist you in the future.   These are the goals we discussed:  Goals      Blood Pressure < 130/80     DIET - INCREASE WATER INTAKE     Try to drink 6-8 glasses of water daily     Eat more fruits and vegetables     Exercise 150 min/wk Moderate Activity     Patient Stated     02/08/2020 AWV Goal: Fall Prevention  Over the next year, patient will decrease their risk for falls by: Using assistive devices, such as a cane or walker, as needed Identifying fall risks within their home and correcting them by: Removing throw rugs Adding handrails to stairs or ramps Removing clutter and keeping a clear pathway throughout the home Increasing light, especially at night Adding shower handles/bars Raising toilet seat Identifying potential personal risk factors for falls: Medication side effects Incontinence/urgency Vestibular dysfunction Hearing loss Musculoskeletal disorders Neurological disorders Orthostatic hypotension          This is a list of the screening recommended for you and due dates:  Health Maintenance  Topic Date Due   COVID-19 Vaccine (1) Never done   Zoster (Shingles) Vaccine (1 of 2) Never done   Eye exam for diabetics  12/07/2018   Flu Shot  06/24/2022*   Hemoglobin A1C  05/04/2022   Yearly kidney health urinalysis for diabetes  09/29/2022   Complete foot exam   09/29/2022   Yearly kidney function blood test for diabetes  02/02/2023   Medicare Annual Wellness Visit  03/13/2023   DTaP/Tdap/Td vaccine (2 - Tdap) 01/28/2025   Pneumonia Vaccine  Completed   Hepatitis C Screening: USPSTF Recommendation to screen - Ages 51-79 yo.  Completed   HPV Vaccine  Aged Out   Colon Cancer Screening  Discontinued  *Topic was postponed. The date shown is not the  original due date.    Advanced directives: Please bring a copy of your health care power of attorney and living will to the office to be added to your chart at your convenience.   Conditions/risks identified: Aim for 30 minutes of exercise or brisk walking, 6-8 glasses of water, and 5 servings of fruits and vegetables each day.   Next appointment: Follow up in one year for your annual wellness visit.   Preventive Care 77 Years and Older, Male  Preventive care refers to lifestyle choices and visits with your health care provider that can promote health and wellness. What does preventive care include? A yearly physical exam. This is also called an annual well check. Dental exams once or twice a year. Routine eye exams. Ask your health care provider how often you should have your eyes checked. Personal lifestyle choices, including: Daily care of your teeth and gums. Regular physical activity. Eating a healthy diet. Avoiding tobacco and drug use. Limiting alcohol use. Practicing safe sex. Taking low doses of aspirin every day. Taking vitamin and mineral supplements as recommended by your health care provider. What happens during an annual well check? The services and screenings done by your health care provider during your annual well check will depend on your age, overall health, lifestyle risk factors, and family history of disease. Counseling  Your health care provider may ask you questions about your: Alcohol use.  Tobacco use. Drug use. Emotional well-being. Home and relationship well-being. Sexual activity. Eating habits. History of falls. Memory and ability to understand (cognition). Work and work Statistician. Screening  You may have the following tests or measurements: Height, weight, and BMI. Blood pressure. Lipid and cholesterol levels. These may be checked every 5 years, or more frequently if you are over 33 years old. Skin check. Lung cancer screening. You may have  this screening every year starting at age 68 if you have a 30-pack-year history of smoking and currently smoke or have quit within the past 15 years. Fecal occult blood test (FOBT) of the stool. You may have this test every year starting at age 80. Flexible sigmoidoscopy or colonoscopy. You may have a sigmoidoscopy every 5 years or a colonoscopy every 10 years starting at age 65. Prostate cancer screening. Recommendations will vary depending on your family history and other risks. Hepatitis C blood test. Hepatitis B blood test. Sexually transmitted disease (STD) testing. Diabetes screening. This is done by checking your blood sugar (glucose) after you have not eaten for a while (fasting). You may have this done every 1-3 years. Abdominal aortic aneurysm (AAA) screening. You may need this if you are a current or former smoker. Osteoporosis. You may be screened starting at age 69 if you are at high risk. Talk with your health care provider about your test results, treatment options, and if necessary, the need for more tests. Vaccines  Your health care provider may recommend certain vaccines, such as: Influenza vaccine. This is recommended every year. Tetanus, diphtheria, and acellular pertussis (Tdap, Td) vaccine. You may need a Td booster every 10 years. Zoster vaccine. You may need this after age 62. Pneumococcal 13-valent conjugate (PCV13) vaccine. One dose is recommended after age 61. Pneumococcal polysaccharide (PPSV23) vaccine. One dose is recommended after age 36. Talk to your health care provider about which screenings and vaccines you need and how often you need them. This information is not intended to replace advice given to you by your health care provider. Make sure you discuss any questions you have with your health care provider. Document Released: 04/08/2015 Document Revised: 11/30/2015 Document Reviewed: 01/11/2015 Elsevier Interactive Patient Education  2017 [employer] Prevention in the Home Falls can cause injuries. They can happen to people of all ages. There are many things you can do to make your home safe and to help prevent falls. What can I do on the outside of my home? Regularly fix the edges of walkways and driveways and fix any cracks. Remove anything that might make you trip as you walk through a door, such as a raised step or threshold. Trim any bushes or trees on the path to your home. Use bright outdoor lighting. Clear any walking paths of anything that might make someone trip, such as rocks or tools. Regularly check to see if handrails are loose or broken. Make sure that both sides of any steps have handrails. Any raised decks and porches should have guardrails on the edges. Have any leaves, snow, or ice cleared regularly. Use sand or salt on walking paths during winter. Clean up any spills in your garage right away. This includes oil or grease spills. What can I do in the bathroom? Use night lights. Install grab bars by the toilet and in the tub and shower. Do not use towel bars as grab bars. Use non-skid mats or decals in the tub or shower. If you need to sit down in  the shower, use a plastic, non-slip stool. Keep the floor dry. Clean up any water that spills on the floor as soon as it happens. Remove soap buildup in the tub or shower regularly. Attach bath mats securely with double-sided non-slip rug tape. Do not have throw rugs and other things on the floor that can make you trip. What can I do in the bedroom? Use night lights. Make sure that you have a light by your bed that is easy to reach. Do not use any sheets or blankets that are too big for your bed. They should not hang down onto the floor. Have a firm chair that has side arms. You can use this for support while you get dressed. Do not have throw rugs and other things on the floor that can make you trip. What can I do in the kitchen? Clean up any spills right  away. Avoid walking on wet floors. Keep items that you use a lot in easy-to-reach places. If you need to reach something above you, use a strong step stool that has a grab bar. Keep electrical cords out of the way. Do not use floor polish or wax that makes floors slippery. If you must use wax, use non-skid floor wax. Do not have throw rugs and other things on the floor that can make you trip. What can I do with my stairs? Do not leave any items on the stairs. Make sure that there are handrails on both sides of the stairs and use them. Fix handrails that are broken or loose. Make sure that handrails are as long as the stairways. Check any carpeting to make sure that it is firmly attached to the stairs. Fix any carpet that is loose or worn. Avoid having throw rugs at the top or bottom of the stairs. If you do have throw rugs, attach them to the floor with carpet tape. Make sure that you have a light switch at the top of the stairs and the bottom of the stairs. If you do not have them, ask someone to add them for you. What else can I do to help prevent falls? Wear shoes that: Do not have high heels. Have rubber bottoms. Are comfortable and fit you well. Are closed at the toe. Do not wear sandals. If you use a stepladder: Make sure that it is fully opened. Do not climb a closed stepladder. Make sure that both sides of the stepladder are locked into place. Ask someone to hold it for you, if possible. Clearly mark and make sure that you can see: Any grab bars or handrails. First and last steps. Where the edge of each step is. Use tools that help you move around (mobility aids) if they are needed. These include: Canes. Walkers. Scooters. Crutches. Turn on the lights when you go into a dark area. Replace any light bulbs as soon as they burn out. Set up your furniture so you have a clear path. Avoid moving your furniture around. If any of your floors are uneven, fix them. If there are any  pets around you, be aware of where they are. Review your medicines with your doctor. Some medicines can make you feel dizzy. This can increase your chance of falling. Ask your doctor what other things that you can do to help prevent falls. This information is not intended to replace advice given to you by your health care provider. Make sure you discuss any questions you have with your health care provider. Document Released:  01/06/2009 Document Revised: 08/18/2015 Document Reviewed: 04/16/2014 Elsevier Interactive Patient Education  2017 [employer].

## 2022-03-12 NOTE — Progress Notes (Signed)
Subjective:   Peter York is a 76 y.o. male who presents for Medicare Annual/Subsequent preventive examination.  I connected with  Peter York on 03/12/22 by a audio enabled telemedicine application and verified that I am speaking with the correct person using two identifiers.  Patient Location: Home  Provider Location: Home Office  I discussed the limitations of evaluation and management by telemedicine. The patient expressed understanding and agreed to proceed.  Review of Systems     Cardiac Risk Factors include: advanced age (>45mn, >>31women);dyslipidemia;diabetes mellitus;hypertension;male gender     Objective:    Today's Vitals   03/12/22 0824  Weight: 149 lb (67.6 kg)  Height: _0  (1.676 m)   Body mass index is 24.05 kg/m.     03/12/2022   12:39 PM 02/01/2022    2:06 PM 02/08/2021    2:07 PM 02/08/2020   10:17 AM 02/05/2019    3:49 PM 07/01/2018    5:01 PM 07/01/2018    4:33 PM  Advanced Directives  Does Patient Have a Medical Advance Directive? Yes No No No No  No  Type of Advance Directive Living will;Healthcare Power of Attorney        Does patient want to make changes to medical advance directive? No - Patient declined        Copy of [place] Chart? No - copy requested        Would patient like information on creating a medical advance directive?  No - Patient declined No - Patient declined No - Patient declined No - Patient declined No - Patient declined     Current Medications (verified) Outpatient Encounter Medications as of 03/12/2022  Medication Sig   amLODipine (NORVASC) 10 MG tablet Take 1 tablet (10 mg total) by mouth daily.   atorvastatin (LIPITOR) 10 MG tablet Take 1 tablet (10 mg total) by mouth daily. (Patient taking differently: Take 10 mg by mouth 3 (three) times a week.)   Calcium Carbonate-Vitamin D (CALCIUM-D PO) Take 1 tablet by mouth at bedtime.   colchicine 0.6 MG tablet Take 0.6 mg by mouth daily as needed  (gout pain).   docusate sodium (COLACE) 100 MG capsule Take 1 capsule (100 mg total) by mouth 2 (two) times daily.   ezetimibe (ZETIA) 10 MG tablet Take 1 tablet (10 mg total) by mouth daily. (Patient taking differently: Take 10 mg by mouth 2 (two) times a week.)   fluticasone (FLONASE) 50 MCG/ACT nasal spray SPRAY 2 SPRAYS INTO EACH NOSTRIL EVERY DAY   glimepiride (AMARYL) 4 MG tablet Take 1 tablet (4 mg total) by mouth daily with breakfast. (Patient taking differently: Take 4 mg by mouth once a week.)   glucose blood (ONETOUCH ULTRA) test strip CHECK BLOOD SUGAR DAILY DX E11.9   hydrALAZINE (APRESOLINE) 25 MG tablet TAKE 1 TABLET BY MOUTH THREE TIMES A DAY   losartan (COZAAR) 100 MG tablet Take 1 tablet (100 mg total) by mouth daily. (Patient taking differently: Take 50 mg by mouth daily.)   Specialty Vitamins Products (PROSTATE PO) Take 1 tablet by mouth daily. Super Beta Prostate   vitamin E 400 UNIT capsule Take 1 capsule (400 Units total) by mouth daily.   No facility-administered encounter medications on file as of 03/12/2022.    Allergies (verified) Crestor [rosuvastatin calcium] and Benazepril   History: Past Medical History:  Diagnosis Date   Adenomatous colon polyp 12/31/2005   COVID-19 02/2020   Diabetes mellitus    Hypercholesterolemia  Hypertension    Intracranial hemorrhage ([place]) 2020   secondary to hypertensive crisis   Renal artery stenosis (HCC)    Left renal artery 1-59% by dopplers 10/2018   Stroke [facility])    Past Surgical History:  Procedure Laterality Date   ANAL FISTULECTOMY  10/02/2011   Procedure: FISTULECTOMY ANAL;  Surgeon: Joyice Faster. Cornett, MD;  Location: [place];  Service: General;  Laterality: N/A;  excision perianal mass    APPENDECTOMY     COLONOSCOPY W/ POLYPECTOMY  12/31/2005   Dr. Silvano Rusk   HERNIA REPAIR     Repaired 02/05/2022   INGUINAL HERNIA REPAIR Right 02/05/2022   Procedure: HERNIA REPAIR INGUINAL ADULT W/ MESH;   Surgeon: Rusty Aus, DO;  Location: [facility];  Service: General;  Laterality: Right;   UMBILICAL HERNIA REPAIR     Family History  Problem Relation Age of Onset   Colon cancer Father 57   Colon polyps Father    Cancer Father 98       Colon   Heart attack Maternal Uncle        X 4   Heart disease Maternal Uncle    Heart disease Maternal Uncle    Heart disease Maternal Uncle    Diabetes Daughter 77       Type 1   Transient ischemic attack Brother 39   Social History   Socioeconomic History   Marital status: Married    Spouse name: [name]   Number of children: 3   Years of education: 14   Highest education level: Some college, no degree  Occupational History   Occupation: retired  Tobacco Use   Smoking status: Former    Packs/day: 0.75    Years: 15.00    Total pack years: 11.25    Types: Cigarettes    Quit date: 03/26/1978    Years since quitting: 43.9   Smokeless tobacco: Never  Vaping Use   Vaping Use: Never used  Substance and Sexual Activity   Alcohol use: Yes    Comment: cocktail once a month   Drug use: No   Sexual activity: Yes  Other Topics Concern   Not on file  Social History Narrative   Very active and independent - trains bird dogs, raises horses, etc   Retired Print production planner   Married 1 son, 2 daughters (son in [place])   grandkids and greatgrandkids   No EtOH, former smoker and no drugs   Social Determinants of Health   Financial Resource Strain: Low Risk  (03/12/2022)   Overall Financial Resource Strain (CARDIA)    Difficulty of Paying Living Expenses: Not hard at all  Food Insecurity: No Food Insecurity (03/12/2022)   Hunger Vital Sign    Worried About Running Out of Food in the Last Year: Never true    [place] in the Last Year: Never true  Transportation Needs: No Transportation Needs (03/12/2022)   PRAPARE - Hydrologist (Medical): No    Lack of Transportation (Non-Medical): No  Physical  Activity: Sufficiently Active (03/12/2022)   Exercise Vital Sign    Days of Exercise per Week: 6 days    Minutes of Exercise per Session: 60 min  Stress: No Stress Concern Present (03/12/2022)   [place]    Feeling of Stress : Not at all  Social Connections: [place] (03/12/2022)   Social Connection and Isolation Panel [NHANES]  Frequency of Communication with Friends and Family: More than three times a week    Frequency of Social Gatherings with Friends and Family: More than three times a week    Attends Religious Services: More than 4 times per year    Active Member of [employer] or Organizations: Yes    Attends Music therapist: More than 4 times per year    Marital Status: Married    Tobacco Counseling Counseling given: Not Answered   Clinical Intake:  Pre-visit preparation completed: Yes  Pain : No/denies pain  Diabetes: Yes CBG done?: No Did pt. bring in CBG monitor from home?: No  How often do you need to have someone help you when you read instructions, pamphlets, or other written materials from your doctor or pharmacy?: 1 - Never  Diabetic?Yes Nutrition Risk Assessment:  Has the patient had any N/V/D within the last 2 months?  No  Does the patient have any non-healing wounds?  No  Has the patient had any unintentional weight loss or weight gain?  No   Diabetes:  Is the patient diabetic?  Yes  If diabetic, was a CBG obtained today?  No  Did the patient bring in their glucometer from home?  Yes  How often do you monitor your CBG's? Daily .   Financial Strains and Diabetes Management:  Are you having any financial strains with the device, your supplies or your medication? No .  Does the patient want to be seen by Chronic Care Management for management of their diabetes?  No  Would the patient like to be referred to a Nutritionist or for Diabetic Management?  No    Diabetic Exams:  Diabetic Eye Exam: Overdue for diabetic eye exam. Pt has been advised about the importance in completing this exam. Patient advised to call and schedule an eye exam. Diabetic Foot Exam: Completed 09/28/21    Interpreter Needed?: No  Information entered by :: Denman George LPN   Activities of Daily Living    03/12/2022   12:39 PM 03/11/2022    9:05 AM  In your present state of health, do you have any difficulty performing the following activities:  Hearing? 0 0  Vision? 0 0  Difficulty concentrating or making decisions? 0 0  Walking or climbing stairs? 0 0  Dressing or bathing? 0 0  Doing errands, shopping? 0 0  Preparing Food and eating ? N N  Using the Toilet? N N  In the past six months, have you accidently leaked urine? N N  Do you have problems with loss of bowel control? N N  Managing your Medications? N N  Managing your Finances? N N  Housekeeping or managing your Housekeeping? N N    Patient Care Team: Chevis Pretty, FNP as PCP - General (Family Medicine) Sueanne Margarita, MD as PCP - Cardiology (Cardiology) Gatha Mayer, MD as Consulting Physician (Gastroenterology) Harlen Labs, MD as Referring Physician (Optometry) Calvert Cantor, MD as Consulting Physician (Ophthalmology) Ashok Pall, MD as Consulting Physician (Neurosurgery)  Indicate any recent Medical Services you may have received from other than [facility] providers in the past year (date may be approximate).     Assessment:   This is a routine wellness examination for Peter York.  Hearing/Vision screen Hearing Screening - Comments:: No concerns noted  Vision Screening - Comments:: Wears rx glasses - up to date with routine [facility] exams with [facility]    Dietary issues and exercise activities discussed: Current Exercise  Habits: Home exercise routine, Type of exercise: walking, Time (Minutes): 60, Frequency (Times/Week): 7, Weekly Exercise (Minutes/Week): 420,  Intensity: Mild   Goals Addressed             This Visit's Progress    DIET - INCREASE WATER INTAKE   On track    Try to drink 6-8 glasses of water daily      Depression Screen    03/12/2022   12:38 PM 12/25/2021    8:46 AM 09/28/2021    7:59 AM 02/08/2021    2:17 PM 12/22/2020    7:57 AM 06/20/2020    8:04 AM 03/15/2020    8:27 AM  PHQ 2/9 Scores  PHQ - 2 Score 0 0 0 0 0 0 0  PHQ- 9 Score  0 0 0 0      Fall Risk    03/12/2022    8:25 AM 03/11/2022    9:05 AM 01/25/2022    1:23 PM 12/25/2021    8:46 AM 09/28/2021    7:59 AM  Fall Risk   Falls in the past year? 0 0 0 0 0  Number falls in past yr: 0 0     Injury with Fall? 0 0     Risk for fall due to : No Fall Risks      Follow up Falls evaluation completed;Education provided;Falls prevention discussed  Falls evaluation completed      FALL RISK PREVENTION PERTAINING TO THE HOME:  Any stairs in or around the home? Yes  If so, are there any without handrails? No  Home free of loose throw rugs in walkways, pet beds, electrical cords, etc? Yes  Adequate lighting in your home to reduce risk of falls? Yes   ASSISTIVE DEVICES UTILIZED TO PREVENT FALLS:  Life alert? No  Use of a cane, walker or w/c? No  Grab bars in the bathroom? Yes  Shower chair or bench in shower? No  Elevated toilet seat or a handicapped toilet? Yes   TIMED UP AND GO:  Was the test performed? No .  Length of time to ambulate 10 feet: telephonic visit    Cognitive Function:    07/22/2017    9:06 AM 07/19/2016    2:16 PM  MMSE - Mini Mental State Exam  Orientation to time 5 5  Orientation to Place 5 5  Registration 3 3  Attention/ Calculation 4 5  Recall 2 0  Language- name 2 objects 2 2  Language- repeat 1 1  Language- follow 3 step command 3 3  Language- read & follow direction 1 1  Write a sentence 1 1  Copy design 1 1  Total score 28 27        03/12/2022   12:39 PM 02/08/2021    2:17 PM 02/08/2020   10:19 AM 02/05/2019     3:56 PM  6CIT Screen  What Year? 0 points 0 points 0 points 0 points  What month? 0 points 0 points  0 points  What time? 0 points 0 points 0 points 0 points  Count back from 20 0 points 0 points 0 points 0 points  Months in reverse 0 points 0 points  0 points  Repeat phrase 0 points 0 points 0 points 0 points  Total Score 0 points 0 points  0 points    Immunizations Immunization History  Administered Date(s) Administered   Pneumococcal Conjugate-13 04/14/2013   Pneumococcal Polysaccharide-23 09/16/2014   Td 01/29/2015    TDAP  status: Up to date  Flu Vaccine status: Declined, Education has been provided regarding the importance of this vaccine but patient still declined. Advised may receive this vaccine at local pharmacy or Health Dept. Aware to provide a copy of the vaccination record if obtained from local pharmacy or Health Dept. Verbalized acceptance and understanding.  Pneumococcal vaccine status: Up to date  Covid-19 vaccine status: Information provided on how to obtain vaccines.   Qualifies for Shingles Vaccine? Yes   Zostavax completed No   Shingrix Completed?: No.    Education has been provided regarding the importance of this vaccine. Patient has been advised to call insurance company to determine out of pocket expense if they have not yet received this vaccine. Advised may also receive vaccine at local pharmacy or Health Dept. Verbalized acceptance and understanding.  Screening Tests Health Maintenance  Topic Date Due   COVID-19 Vaccine (1) Never done   Zoster Vaccines- Shingrix (1 of 2) Never done   OPHTHALMOLOGY EXAM  12/07/2018   INFLUENZA VACCINE  06/24/2022 (Originally 10/24/2021)   HEMOGLOBIN A1C  05/04/2022   Diabetic kidney evaluation - Urine ACR  09/29/2022   FOOT EXAM  09/29/2022   Diabetic kidney evaluation - eGFR measurement  02/02/2023   Medicare Annual Wellness (AWV)  03/13/2023   DTaP/Tdap/Td (2 - Tdap) 01/28/2025   Pneumonia Vaccine 40+ Years old   Completed   Hepatitis C Screening  Completed   HPV VACCINES  Aged Out   COLONOSCOPY (Pts 45-55yr Insurance coverage will need to be confirmed)  Discontinued    Health Maintenance  Health Maintenance Due  Topic Date Due   COVID-19 Vaccine (1) Never done   Zoster Vaccines- Shingrix (1 of 2) Never done   OPHTHALMOLOGY EXAM  12/07/2018    Colorectal cancer screening: No longer required.   Lung Cancer Screening: (Low Dose CT Chest recommended if Age 76-80years, 30 pack-year currently smoking OR have quit w/in 15years.) does not qualify.   Lung Cancer Screening Referral: n/a  Additional Screening:  Hepatitis C Screening: does qualify; Completed 03/15/15  Vision Screening: Recommended annual ophthalmology exams for early detection of glaucoma and other disorders of the eye. Is the patient up to date with their annual eye exam?  No  Who is the provider or what is the name of the office in which the patient attends annual eye exams? [facility] If pt is not established with a provider, would they like to be referred to a provider to establish care? No .   Dental Screening: Recommended annual dental exams for proper oral hygiene  Community Resource Referral / Chronic Care Management: CRR required this visit?  No   CCM required this visit?  No      Plan:     I have personally reviewed and noted the following in the patient's chart:   Medical and social history Use of alcohol, tobacco or illicit drugs  Current medications and supplements including opioid prescriptions. Patient is not currently taking opioid prescriptions. Functional ability and status Nutritional status Physical activity Advanced directives List of other physicians Hospitalizations, surgeries, and ER visits in previous 12 months Vitals Screenings to include cognitive, depression, and falls Referrals and appointments  In addition, I have reviewed and discussed with  patient certain preventive protocols, quality metrics, and best practice recommendations. A written personalized care plan for preventive services as well as general preventive health recommendations were provided to patient.     SVanetta Mulders LPN  03/12/2022   Due to this being a virtual visit, the after visit summary with patients personalized plan was offered to patient via mail or my-chart. Patient would like to access on my-chart  Nurse Notes: No concerns

## 2022-03-13 ENCOUNTER — Other Ambulatory Visit: Payer: Self-pay | Admitting: Nurse Practitioner

## 2022-03-13 DIAGNOSIS — J309 Allergic rhinitis, unspecified: Secondary | ICD-10-CM

## 2022-03-27 ENCOUNTER — Ambulatory Visit: Payer: PPO | Admitting: Cardiology

## 2022-04-02 ENCOUNTER — Ambulatory Visit: Payer: PPO | Admitting: Nurse Practitioner

## 2022-04-10 ENCOUNTER — Ambulatory Visit (INDEPENDENT_AMBULATORY_CARE_PROVIDER_SITE_OTHER): Payer: PPO | Admitting: Nurse Practitioner

## 2022-04-10 ENCOUNTER — Encounter: Payer: Self-pay | Admitting: Nurse Practitioner

## 2022-04-10 VITALS — BP 129/75 | HR 60 | Temp 97.6°F | Resp 20 | Ht 66.0 in | Wt 153.0 lb

## 2022-04-10 DIAGNOSIS — E782 Mixed hyperlipidemia: Secondary | ICD-10-CM | POA: Diagnosis not present

## 2022-04-10 DIAGNOSIS — I701 Atherosclerosis of renal artery: Secondary | ICD-10-CM

## 2022-04-10 DIAGNOSIS — M810 Age-related osteoporosis without current pathological fracture: Secondary | ICD-10-CM | POA: Diagnosis not present

## 2022-04-10 DIAGNOSIS — I10 Essential (primary) hypertension: Secondary | ICD-10-CM | POA: Diagnosis not present

## 2022-04-10 DIAGNOSIS — I6930 Unspecified sequelae of cerebral infarction: Secondary | ICD-10-CM

## 2022-04-10 DIAGNOSIS — E119 Type 2 diabetes mellitus without complications: Secondary | ICD-10-CM

## 2022-04-10 DIAGNOSIS — S22060D Wedge compression fracture of T7-T8 vertebra, subsequent encounter for fracture with routine healing: Secondary | ICD-10-CM | POA: Diagnosis not present

## 2022-04-10 LAB — BAYER DCA HB A1C WAIVED: HB A1C (BAYER DCA - WAIVED): 6.3 % — ABNORMAL HIGH (ref 4.8–5.6)

## 2022-04-10 MED ORDER — AMLODIPINE BESYLATE 10 MG PO TABS: 10.0000 mg | ORAL_TABLET | Freq: Every day | ORAL | 1 refills | Status: DC

## 2022-04-10 MED ORDER — ATORVASTATIN CALCIUM 10 MG PO TABS: 10.0000 mg | ORAL_TABLET | Freq: Every day | ORAL | 1 refills | Status: DC

## 2022-04-10 MED ORDER — EZETIMIBE 10 MG PO TABS: 10.0000 mg | ORAL_TABLET | Freq: Every day | ORAL | 1 refills | Status: DC

## 2022-04-10 MED ORDER — GLIMEPIRIDE 4 MG PO TABS: 4.0000 mg | ORAL_TABLET | Freq: Every day | ORAL | 1 refills | Status: DC

## 2022-04-10 MED ORDER — LOSARTAN POTASSIUM 100 MG PO TABS: 100.0000 mg | ORAL_TABLET | Freq: Every day | ORAL | 1 refills | Status: DC

## 2022-04-10 NOTE — Progress Notes (Signed)
Subjective:    Patient ID: Peter York, male    DOB: July 11, 1945, 77 y.o.   MRN: [id number]   Chief Complaint: medical management of chronic issues     HPI:  Peter York is a 77 y.o. who identifies as a male who was assigned male at birth.   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 Primary hypertension No c/o chest pain, sob or headache. Does not check blood pressure at home. BP Readings from Last 3 Encounters:  02/20/22 129/72  02/05/22 (!) 112/55  02/01/22 (!) 114/54     2. Renal artery stenosis (HCC) Affects his blood pressure.  3. Type 2 diabetes mellitus without complication, without long-term current use of insulin (HCC) Fasting blood sugars are running around 120-130. Denies any low blood sugars. Lab Results  Component Value Date   HGBA1C 6.0 (H) 02/01/2022     4. Mixed hyperlipidemia Does try to watch diet and stays very active with his hunting dogs and horses. Lab Results  Component Value Date   CHOL 118 09/28/2021   HDL 38 (L) 09/28/2021   LDLCALC 65 09/28/2021   TRIG 71 09/28/2021   CHOLHDL 3.1 09/28/2021     5. Late effect of cerebrovascular accident (CVA) No permanent effects  6. Osteoporosis of vertebra Rides horse more then he does weight bearing exercise. His last dexa scan was done 10/02/21. T score was -2.2.  7. Compression fracture of T7 vertebra with routine healing, subsequent encounter This came from his osteroporosis and riding horses in an hunting dog competition   New complaints: None today  Allergies  Allergen Reactions   Crestor [Rosuvastatin Calcium] Other (See Comments)    "like to killed me." joint pain, unable to get OOB   Benazepril Other (See Comments)    hyperkalemia   Outpatient Encounter Medications as of 04/10/2022  Medication Sig   amLODipine (NORVASC) 10 MG tablet Take 1 tablet (10 mg total) by  mouth daily.   atorvastatin (LIPITOR) 10 MG tablet Take 1 tablet (10 mg total) by mouth daily. (Patient taking differently: Take 10 mg by mouth 3 (three) times a week.)   Calcium Carbonate-Vitamin D (CALCIUM-D PO) Take 1 tablet by mouth at bedtime.   colchicine 0.6 MG tablet Take 0.6 mg by mouth daily as needed (gout pain).   docusate sodium (COLACE) 100 MG capsule Take 1 capsule (100 mg total) by mouth 2 (two) times daily.   ezetimibe (ZETIA) 10 MG tablet Take 1 tablet (10 mg total) by mouth daily. (Patient taking differently: Take 10 mg by mouth 2 (two) times a week.)   fluticasone (FLONASE) 50 MCG/ACT nasal spray SPRAY 2 SPRAYS INTO EACH NOSTRIL EVERY DAY   glimepiride (AMARYL) 4 MG tablet Take 1 tablet (4 mg total) by mouth daily with breakfast. (Patient taking differently: Take 4 mg by mouth once a week.)   glucose blood (ONETOUCH ULTRA) test strip CHECK BLOOD SUGAR DAILY DX E11.9   hydrALAZINE (APRESOLINE) 25 MG tablet TAKE 1 TABLET BY MOUTH THREE TIMES A DAY   losartan (COZAAR) 100 MG tablet Take 1 tablet (100 mg total) by mouth daily. (Patient taking differently: Take 50 mg by mouth daily.)   Specialty Vitamins Products (PROSTATE PO) Take 1 tablet by mouth daily. Super Beta Prostate   vitamin E 400 UNIT capsule Take 1 capsule (400 Units total) by mouth daily.  No facility-administered encounter medications on file as of 04/10/2022.    Past Surgical History:  Procedure Laterality Date   ANAL FISTULECTOMY  10/02/2011   Procedure: FISTULECTOMY ANAL;  Surgeon: Joyice Faster. Cornett, MD;  Location: [place];  Service: General;  Laterality: N/A;  excision perianal mass    APPENDECTOMY     COLONOSCOPY W/ POLYPECTOMY  12/31/2005   Dr. Silvano Rusk   HERNIA REPAIR     Repaired 02/05/2022   INGUINAL HERNIA REPAIR Right 02/05/2022   Procedure: HERNIA REPAIR INGUINAL ADULT W/ MESH;  Surgeon: Rusty Aus, DO;  Location: [facility];  Service: General;  Laterality: Right;    UMBILICAL HERNIA REPAIR      Family History  Problem Relation Age of Onset   Colon cancer Father 80   Colon polyps Father    Cancer Father 66       Colon   Heart attack Maternal Uncle        X 4   Heart disease Maternal Uncle    Heart disease Maternal Uncle    Heart disease Maternal Uncle    Diabetes Daughter 80       Type 1   Transient ischemic attack Brother 75      Controlled substance contract: n/a     Review of Systems  Constitutional:  Negative for diaphoresis.  Eyes:  Negative for pain.  Respiratory:  Negative for shortness of breath.   Cardiovascular:  Negative for chest pain, palpitations and leg swelling.  Gastrointestinal:  Negative for abdominal pain.  Endocrine: Negative for polydipsia.  Skin:  Negative for rash.  Neurological:  Negative for dizziness, weakness and headaches.  Hematological:  Does not bruise/bleed easily.  All other systems reviewed and are negative.      Objective:   Physical Exam Vitals and nursing note reviewed.  Constitutional:      Appearance: Normal appearance. He is well-developed.  HENT:     Head: Normocephalic.     Nose: Nose normal.     Mouth/Throat:     Mouth: Mucous membranes are moist.     Pharynx: Oropharynx is clear.  Eyes:     Pupils: Pupils are equal, round, and reactive to light.  Neck:     Thyroid: No thyroid mass or thyromegaly.     Vascular: No carotid bruit or JVD.     Trachea: Phonation normal.  Cardiovascular:     Rate and Rhythm: Normal rate and regular rhythm.  Pulmonary:     Effort: Pulmonary effort is normal. No respiratory distress.     Breath sounds: Normal breath sounds.  Abdominal:     General: Bowel sounds are normal.     Palpations: Abdomen is soft.     Tenderness: There is no abdominal tenderness.  Musculoskeletal:        General: Normal range of motion.     Cervical back: Normal range of motion and neck supple.  Lymphadenopathy:     Cervical: No cervical adenopathy.  Skin:     General: Skin is warm and dry.  Neurological:     Mental Status: He is alert and oriented to person, place, and time.  Psychiatric:        Behavior: Behavior normal.        Thought Content: Thought content normal.        Judgment: Judgment normal.    BP 129/75   Pulse 60   Temp 97.6 F (36.4 C) (Temporal)   Resp 20  Ht '5\' 6"'$  (1.676 m)   Wt 153 lb (69.4 kg)   SpO2 100%   BMI 24.69 kg/m    Hgba1c 6.3%     Assessment & Plan:  Peter York comes in today with chief complaint of Medical Management of Chronic Issues   Diagnosis and orders addressed:  1. Primary hypertension Low sodium diet - CBC with Differential/Platelet - CMP14+EGFR  2. Renal artery stenosis ([place])  3. Type 2 diabetes mellitus without complication, without long-term current use of insulin (HCC) Continue to watch carbs in diet - Bayer DCA Hb A1c Waived - glimepiride (AMARYL) 4 MG tablet; Take 1 tablet (4 mg total) by mouth daily with breakfast.  Dispense: 90 tablet; Refill: 1  4. Mixed hyperlipidemia Low fat diet - Lipid panel - amLODipine (NORVASC) 10 MG tablet; Take 1 tablet (10 mg total) by mouth daily.  Dispense: 90 tablet; Refill: 1 - atorvastatin (LIPITOR) 10 MG tablet; Take 1 tablet (10 mg total) by mouth daily.  Dispense: 90 tablet; Refill: 1 - ezetimibe (ZETIA) 10 MG tablet; Take 1 tablet (10 mg total) by mouth daily.  Dispense: 90 tablet; Refill: 1 - losartan (COZAAR) 100 MG tablet; Take 1 tablet (100 mg total) by mouth daily.  Dispense: 90 tablet; Refill: 1  5. Late effect of cerebrovascular accident (CVA)  6. Osteoporosis of vertebra Weight bearing exercises  7. Compression fracture of T7 vertebra with routine healing, subsequent encounter   Labs pending Health Maintenance reviewed Diet and exercise encouraged  Follow up plan: 3 months   [place], FNP

## 2022-04-10 NOTE — Patient Instructions (Signed)

## 2022-04-11 LAB — CBC WITH DIFFERENTIAL/PLATELET
Basophils Absolute: 0.1 10*3/uL (ref 0.0–0.2)
Basos: 1 %
EOS (ABSOLUTE): 0.4 10*3/uL (ref 0.0–0.4)
Eos: 5 %
Hematocrit: 36 % — ABNORMAL LOW (ref 37.5–51.0)
Hemoglobin: 12.4 g/dL — ABNORMAL LOW (ref 13.0–17.7)
Immature Grans (Abs): 0 10*3/uL (ref 0.0–0.1)
Immature Granulocytes: 0 %
Lymphocytes Absolute: 1.2 10*3/uL (ref 0.7–3.1)
Lymphs: 16 %
MCH: 32.1 pg (ref 26.6–33.0)
MCHC: 34.4 g/dL (ref 31.5–35.7)
MCV: 93 fL (ref 79–97)
Monocytes Absolute: 0.5 10*3/uL (ref 0.1–0.9)
Monocytes: 7 %
Neutrophils Absolute: 5.3 10*3/uL (ref 1.4–7.0)
Neutrophils: 71 %
Platelets: 264 10*3/uL (ref 150–450)
RBC: 3.86 x10E6/uL — ABNORMAL LOW (ref 4.14–5.80)
RDW: 12.1 % (ref 11.6–15.4)
WBC: 7.4 10*3/uL (ref 3.4–10.8)

## 2022-04-11 LAB — CMP14+EGFR
ALT: 19 IU/L (ref 0–44)
AST: 18 IU/L (ref 0–40)
Albumin/Globulin Ratio: 1.4 (ref 1.2–2.2)
Albumin: 4.3 g/dL (ref 3.8–4.8)
Alkaline Phosphatase: 74 IU/L (ref 44–121)
BUN/Creatinine Ratio: 16 (ref 10–24)
BUN: 27 mg/dL (ref 8–27)
Bilirubin Total: 0.8 mg/dL (ref 0.0–1.2)
CO2: 23 mmol/L (ref 20–29)
Calcium: 9.4 mg/dL (ref 8.6–10.2)
Chloride: 103 mmol/L (ref 96–106)
Creatinine, Ser: 1.7 mg/dL — ABNORMAL HIGH (ref 0.76–1.27)
Globulin, Total: 3 g/dL (ref 1.5–4.5)
Glucose: 124 mg/dL — ABNORMAL HIGH (ref 70–99)
Potassium: 4.9 mmol/L (ref 3.5–5.2)
Sodium: 139 mmol/L (ref 134–144)
Total Protein: 7.3 g/dL (ref 6.0–8.5)
eGFR: 41 mL/min/{1.73_m2} — ABNORMAL LOW (ref >59)

## 2022-04-11 LAB — LIPID PANEL
Chol/HDL Ratio: 3.5 ratio (ref 0.0–5.0)
Cholesterol, Total: 133 mg/dL (ref 100–199)
HDL: 38 mg/dL — ABNORMAL LOW (ref >39)
LDL Chol Calc (NIH): 73 mg/dL (ref 0–99)
Triglycerides: 119 mg/dL (ref 0–149)
VLDL Cholesterol Cal: 22 mg/dL (ref 5–40)

## 2022-06-04 ENCOUNTER — Other Ambulatory Visit: Payer: Self-pay | Admitting: Nurse Practitioner

## 2022-06-04 DIAGNOSIS — H1045 Other chronic allergic conjunctivitis: Secondary | ICD-10-CM | POA: Diagnosis not present

## 2022-06-04 DIAGNOSIS — H52223 Regular astigmatism, bilateral: Secondary | ICD-10-CM | POA: Diagnosis not present

## 2022-06-04 DIAGNOSIS — E113293 Type 2 diabetes mellitus with mild nonproliferative diabetic retinopathy without macular edema, bilateral: Secondary | ICD-10-CM | POA: Diagnosis not present

## 2022-06-04 DIAGNOSIS — H5203 Hypermetropia, bilateral: Secondary | ICD-10-CM | POA: Diagnosis not present

## 2022-06-04 DIAGNOSIS — I10 Essential (primary) hypertension: Secondary | ICD-10-CM

## 2022-06-04 DIAGNOSIS — H524 Presbyopia: Secondary | ICD-10-CM | POA: Diagnosis not present

## 2022-06-04 DIAGNOSIS — H43813 Vitreous degeneration, bilateral: Secondary | ICD-10-CM | POA: Diagnosis not present

## 2022-06-04 DIAGNOSIS — H0102A Squamous blepharitis right eye, upper and lower eyelids: Secondary | ICD-10-CM | POA: Diagnosis not present

## 2022-06-04 LAB — HM DIABETES EYE EXAM

## 2022-07-10 DIAGNOSIS — R972 Elevated prostate specific antigen [PSA]: Secondary | ICD-10-CM | POA: Diagnosis not present

## 2022-07-12 ENCOUNTER — Encounter: Payer: Self-pay | Admitting: Nurse Practitioner

## 2022-07-12 ENCOUNTER — Ambulatory Visit (INDEPENDENT_AMBULATORY_CARE_PROVIDER_SITE_OTHER): Payer: PPO | Admitting: Nurse Practitioner

## 2022-07-12 VITALS — BP 126/66 | HR 75 | Temp 97.5°F | Resp 20 | Ht 66.0 in | Wt 148.0 lb

## 2022-07-12 DIAGNOSIS — E785 Hyperlipidemia, unspecified: Secondary | ICD-10-CM | POA: Diagnosis not present

## 2022-07-12 DIAGNOSIS — M810 Age-related osteoporosis without current pathological fracture: Secondary | ICD-10-CM

## 2022-07-12 DIAGNOSIS — J309 Allergic rhinitis, unspecified: Secondary | ICD-10-CM

## 2022-07-12 DIAGNOSIS — Z125 Encounter for screening for malignant neoplasm of prostate: Secondary | ICD-10-CM

## 2022-07-12 DIAGNOSIS — E1169 Type 2 diabetes mellitus with other specified complication: Secondary | ICD-10-CM | POA: Diagnosis not present

## 2022-07-12 DIAGNOSIS — I701 Atherosclerosis of renal artery: Secondary | ICD-10-CM | POA: Diagnosis not present

## 2022-07-12 DIAGNOSIS — I671 Cerebral aneurysm, nonruptured: Secondary | ICD-10-CM

## 2022-07-12 DIAGNOSIS — E119 Type 2 diabetes mellitus without complications: Secondary | ICD-10-CM | POA: Diagnosis not present

## 2022-07-12 DIAGNOSIS — Z7984 Long term (current) use of oral hypoglycemic drugs: Secondary | ICD-10-CM | POA: Diagnosis not present

## 2022-07-12 DIAGNOSIS — I10 Essential (primary) hypertension: Secondary | ICD-10-CM

## 2022-07-12 DIAGNOSIS — I6930 Unspecified sequelae of cerebral infarction: Secondary | ICD-10-CM | POA: Diagnosis not present

## 2022-07-12 DIAGNOSIS — S22060D Wedge compression fracture of T7-T8 vertebra, subsequent encounter for fracture with routine healing: Secondary | ICD-10-CM

## 2022-07-12 LAB — BAYER DCA HB A1C WAIVED: HB A1C (BAYER DCA - WAIVED): 6.1 % — ABNORMAL HIGH (ref 4.8–5.6)

## 2022-07-12 MED ORDER — AMLODIPINE BESYLATE 10 MG PO TABS: 10.0000 mg | ORAL_TABLET | Freq: Every day | ORAL | 1 refills | Status: DC

## 2022-07-12 MED ORDER — FLUTICASONE PROPIONATE 50 MCG/ACT NA SUSP: NASAL | 1 refills | Status: DC

## 2022-07-12 MED ORDER — GLIMEPIRIDE 4 MG PO TABS: 4.0000 mg | ORAL_TABLET | Freq: Every day | ORAL | 1 refills | Status: DC

## 2022-07-12 MED ORDER — ATORVASTATIN CALCIUM 10 MG PO TABS: 10.0000 mg | ORAL_TABLET | Freq: Every day | ORAL | 1 refills | Status: DC

## 2022-07-12 MED ORDER — EZETIMIBE 10 MG PO TABS: 10.0000 mg | ORAL_TABLET | Freq: Every day | ORAL | 1 refills | Status: DC

## 2022-07-12 MED ORDER — LOSARTAN POTASSIUM 100 MG PO TABS: 100.0000 mg | ORAL_TABLET | Freq: Every day | ORAL | 1 refills | Status: DC

## 2022-07-12 NOTE — Progress Notes (Signed)
Subjective:    Patient ID: Peter York, male    DOB: 1945-03-31, 77 y.o.   MRN: [id number]  Chief Complaint: medical management of chronic issues      HPI:  Peter York is a 77 y.o. who identifies as a male who was assigned male at birth.   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 Primary hypertension No c/o chest pain, sob or headache.does not check blood pressure at home. BP Readings from Last 3 Encounters:  04/10/22 129/75  02/20/22 129/72  02/05/22 (!) 112/55     2. Renal artery stenosis/ Last renal U/S was done on 10/30/18 was good  3. Cerebral aneurysm without rupture Denies any headaches or confusion.  4. Hyperlipidemia associated with type 2 diabetes mellitus Does try to watch diet and stays very active with his horses and hunting dogs. Lab Results  Component Value Date   CHOL 133 04/10/2022   HDL 38 (L) 04/10/2022   LDLCALC 73 04/10/2022   TRIG 119 04/10/2022   CHOLHDL 3.5 04/10/2022     5. Type 2 diabetes mellitus without complication, without long-term current use of insulin Fasting blood sugars are running round 100-130. Denies any low blood sugars. Lab Results  Component Value Date   HGBA1C 6.3 (H) 04/10/2022     6. Osteoporosis of vertebra Last dexascan was done on 09/29/21. Has t score was -1.8  7. Compression fracture of T7 vertebra with routine healing, subsequent encounter He has ad several  compression fractures from riding his horse so much  8. Late effect of cerebrovascular accident (CVA) No permanent effects   New complaints: None today  Allergies  Allergen Reactions   Crestor [Rosuvastatin Calcium] Other (See Comments)    "like to killed me." joint pain, unable to get OOB   Benazepril Other (See Comments)    hyperkalemia   Outpatient Encounter Medications as of 07/12/2022  Medication Sig    amLODipine (NORVASC) 10 MG tablet Take 1 tablet (10 mg total) by mouth daily.   atorvastatin (LIPITOR) 10 MG tablet Take 1 tablet (10 mg total) by mouth daily.   Calcium Carbonate-Vitamin D (CALCIUM-D PO) Take 1 tablet by mouth at bedtime.   colchicine 0.6 MG tablet Take 0.6 mg by mouth daily as needed (gout pain).   docusate sodium (COLACE) 100 MG capsule Take 1 capsule (100 mg total) by mouth 2 (two) times daily.   ezetimibe (ZETIA) 10 MG tablet Take 1 tablet (10 mg total) by mouth daily.   fluticasone (FLONASE) 50 MCG/ACT nasal spray SPRAY 2 SPRAYS INTO EACH NOSTRIL EVERY DAY   glimepiride (AMARYL) 4 MG tablet Take 1 tablet (4 mg total) by mouth daily with breakfast.   glucose blood (ONETOUCH ULTRA) test strip CHECK BLOOD SUGAR DAILY DX E11.9   hydrALAZINE (APRESOLINE) 25 MG tablet TAKE 1 TABLET BY MOUTH THREE TIMES A DAY   losartan (COZAAR) 100 MG tablet Take 1 tablet (100 mg total) by mouth daily.   Specialty Vitamins Products (PROSTATE PO) Take 1 tablet by mouth daily. Super Beta Prostate   vitamin E 400 UNIT capsule Take 1 capsule (400 Units total) by mouth daily.   No facility-administered encounter medications on file as of 07/12/2022.    Past Surgical History:  Procedure Laterality Date   ANAL FISTULECTOMY  10/02/2011   Procedure: FISTULECTOMY ANAL;  Surgeon: Clovis Pu. Cornett, MD;  Location: [facility];  Service: General;  Laterality: N/A;  excision perianal mass    APPENDECTOMY     COLONOSCOPY W/ POLYPECTOMY  12/31/2005   Dr. Stan Head   HERNIA REPAIR     Repaired 02/05/2022   INGUINAL HERNIA REPAIR Right 02/05/2022   Procedure: HERNIA REPAIR INGUINAL ADULT W/ MESH;  Surgeon: Lewie Chamber, DO;  Location: [facility];  Service: General;  Laterality: Right;   UMBILICAL HERNIA REPAIR      Family History  Problem Relation Age of Onset   Colon cancer Father 49   Colon polyps Father    Cancer Father 23       Colon   Heart attack Maternal Uncle         X 4   Heart disease Maternal Uncle    Heart disease Maternal Uncle    Heart disease Maternal Uncle    Diabetes Daughter 82       Type 1   Transient ischemic attack Brother 75      Controlled substance contract: n/a     Review of Systems  Constitutional:  Negative for diaphoresis.  Eyes:  Negative for pain.  Respiratory:  Negative for shortness of breath.   Cardiovascular:  Negative for chest pain, palpitations and leg swelling.  Gastrointestinal:  Negative for abdominal pain.  Endocrine: Negative for polydipsia.  Skin:  Negative for rash.  Neurological:  Negative for dizziness, weakness and headaches.  Hematological:  Does not bruise/bleed easily.  All other systems reviewed and are negative.      Objective:   Physical Exam Vitals and nursing note reviewed.  Constitutional:      Appearance: Normal appearance. He is well-developed.  HENT:     Head: Normocephalic.     Nose: Nose normal.     Mouth/Throat:     Mouth: Mucous membranes are moist.     Pharynx: Oropharynx is clear.  Eyes:     Pupils: Pupils are equal, round, and reactive to light.  Neck:     Thyroid: No thyroid mass or thyromegaly.     Vascular: No carotid bruit or JVD.     Trachea: Phonation normal.  Cardiovascular:     Rate and Rhythm: Normal rate and regular rhythm.  Pulmonary:     Effort: Pulmonary effort is normal. No respiratory distress.     Breath sounds: Normal breath sounds.  Abdominal:     General: Bowel sounds are normal.     Palpations: Abdomen is soft.     Tenderness: There is no abdominal tenderness.  Musculoskeletal:        General: Normal range of motion.     Cervical back: Normal range of motion and neck supple.  Lymphadenopathy:     Cervical: No cervical adenopathy.  Skin:    General: Skin is warm and dry.  Neurological:     Mental Status: He is alert and oriented to person, place, and time.  Psychiatric:        Behavior: Behavior normal.        Thought Content: Thought  content normal.        Judgment: Judgment normal.     BP 126/66   Pulse 75   Temp (!) 97.5 F (36.4 C) (Temporal)   Resp 20   Ht 5\' 6"  (1.676 m)   Wt 148 lb (67.1 kg)   SpO2 99%   BMI 23.89 kg/m    HGBA1c 6.1%   Peter York comes in today with  chief complaint of Medical Management of Chronic Issues   Diagnosis and orders addressed:  1. Primary hypertension Low sodium diet - CBC with Differential/Platelet - CMP14+EGFR - amLODipine (NORVASC) 10 MG tablet; Take 1 tablet (10 mg total) by mouth daily.  Dispense: 90 tablet; Refill: 1 - losartan (COZAAR) 100 MG tablet; Take 1 tablet (100 mg total) by mouth daily.  Dispense: 90 tablet; Refill: 1  2. Renal artery stenosis Will repeat U/S later in the month  3. Cerebral aneurysm without rupture Report any dizziness or personality changes  4. Hyperlipidemia associated with type 2 diabetes mellitus Low fat diet - Lipid panel - atorvastatin (LIPITOR) 10 MG tablet; Take 1 tablet (10 mg total) by mouth daily.  Dispense: 90 tablet; Refill: 1 - ezetimibe (ZETIA) 10 MG tablet; Take 1 tablet (10 mg total) by mouth daily.  Dispense: 90 tablet; Refill: 1  5. Type 2 diabetes mellitus without complication, without long-term current use of insulin Continue to watch carbs in diet - Bayer DCA Hb A1c Waived - glimepiride (AMARYL) 4 MG tablet; Take 1 tablet (4 mg total) by mouth daily with breakfast.  Dispense: 90 tablet; Refill: 1  6. Osteoporosis of vertebra Weight bearing exercises  7. Compression fracture of T7 vertebra with routine healing, subsequent encounter Avoid riding horse for greater then 2 hours at a time.  8. Late effect of cerebrovascular accident (CVA)  9. Screening for prostate cancer Labs pending - PSA, total and free  10. Allergic rhinitis, unspecified seasonality, unspecified trigger - fluticasone (FLONASE) 50 MCG/ACT nasal spray; SPRAY 2 SPRAYS INTO EACH NOSTRIL EVERY DAY  Dispense: 48 mL; Refill: 1   Labs  pending Health Maintenance reviewed Diet and exercise encouraged  Follow up plan: 6 month   Mary-Margaret Daphine Deutscher, FNP

## 2022-07-12 NOTE — Patient Instructions (Signed)
Exercising to Stay Healthy To become healthy and stay healthy, it is recommended that you do moderate-intensity and vigorous-intensity exercise. You can tell that you are exercising at a moderate intensity if your heart starts beating faster and you start breathing faster but can still hold a conversation. You can tell that you are exercising at a vigorous intensity if you are breathing much harder and faster and cannot hold a conversation while exercising. How can exercise benefit me? Exercising regularly is important. It has many health benefits, such as: Improving overall fitness, flexibility, and endurance. Increasing bone density. Helping with weight control. Decreasing body fat. Increasing muscle strength and endurance. Reducing stress and tension, anxiety, depression, or anger. Improving overall health. What guidelines should I follow while exercising? Before you start a new exercise program, talk with your health care provider. Do not exercise so much that you hurt yourself, feel dizzy, or get very short of breath. Wear comfortable clothes and wear shoes with good support. Drink plenty of water while you exercise to prevent dehydration or heat stroke. Work out until your breathing and your heartbeat get faster (moderate intensity). How often should I exercise? Choose an activity that you enjoy, and set realistic goals. Your health care provider can help you make an activity plan that is individually designed and works best for you. Exercise regularly as told by your health care provider. This may include: Doing strength training two times a week, such as: Lifting weights. Using resistance bands. Push-ups. Sit-ups. Yoga. Doing a certain intensity of exercise for a given amount of time. Choose from these options: A total of 150 minutes of moderate-intensity exercise every week. A total of 75 minutes of vigorous-intensity exercise every week. A mix of moderate-intensity and  vigorous-intensity exercise every week. Children, pregnant women, people who have not exercised regularly, people who are overweight, and older adults may need to talk with a health care provider about what activities are safe to perform. If you have a medical condition, be sure to talk with your health care provider before you start a new exercise program. What are some exercise ideas? Moderate-intensity exercise ideas include: Walking 1 mile (1.6 km) in about 15 minutes. Biking. Hiking. Golfing. Dancing. Water aerobics. Vigorous-intensity exercise ideas include: Walking 4.5 miles (7.2 km) or more in about 1 hour. Jogging or running 5 miles (8 km) in about 1 hour. Biking 10 miles (16.1 km) or more in about 1 hour. Lap swimming. Roller-skating or in-line skating. Cross-country skiing. Vigorous competitive sports, such as football, basketball, and soccer. Jumping rope. Aerobic dancing. What are some everyday activities that can help me get exercise? Yard work, such as: Pushing a lawn mower. Raking and bagging leaves. Washing your car. Pushing a stroller. Shoveling snow. Gardening. Washing windows or floors. How can I be more active in my day-to-day activities? Use stairs instead of an elevator. Take a walk during your lunch break. If you drive, park your car farther away from your work or school. If you take public transportation, get off one stop early and walk the rest of the way. Stand up or walk around during all of your indoor phone calls. Get up, stretch, and walk around every 30 minutes throughout the day. Enjoy exercise with a friend. Support to continue exercising will help you keep a regular routine of activity. Where to find more information You can find more information about exercising to stay healthy from: U.S. Department of Health and Human Services: www.hhs.gov Centers for Disease Control and Prevention (  CDC): www.cdc.gov Summary Exercising regularly is  important. It will improve your overall fitness, flexibility, and endurance. Regular exercise will also improve your overall health. It can help you control your weight, reduce stress, and improve your bone density. Do not exercise so much that you hurt yourself, feel dizzy, or get very short of breath. Before you start a new exercise program, talk with your health care provider. This information is not intended to replace advice given to you by your health care provider. Make sure you discuss any questions you have with your health care provider. Document Revised: 07/08/2020 Document Reviewed: 07/08/2020 Elsevier Patient Education  2023 Elsevier Inc.  

## 2022-07-13 LAB — CMP14+EGFR
ALT: 15 IU/L (ref 0–44)
AST: 18 IU/L (ref 0–40)
Albumin/Globulin Ratio: 1.6 (ref 1.2–2.2)
Albumin: 4.4 g/dL (ref 3.8–4.8)
Alkaline Phosphatase: 81 IU/L (ref 44–121)
BUN/Creatinine Ratio: 19 (ref 10–24)
BUN: 31 mg/dL — ABNORMAL HIGH (ref 8–27)
Bilirubin Total: 0.6 mg/dL (ref 0.0–1.2)
CO2: 20 mmol/L (ref 20–29)
Calcium: 9.6 mg/dL (ref 8.6–10.2)
Chloride: 103 mmol/L (ref 96–106)
Creatinine, Ser: 1.6 mg/dL — ABNORMAL HIGH (ref 0.76–1.27)
Globulin, Total: 2.8 g/dL (ref 1.5–4.5)
Glucose: 95 mg/dL (ref 70–99)
Potassium: 5.1 mmol/L (ref 3.5–5.2)
Sodium: 139 mmol/L (ref 134–144)
Total Protein: 7.2 g/dL (ref 6.0–8.5)
eGFR: 44 mL/min/{1.73_m2} — ABNORMAL LOW (ref >59)

## 2022-07-13 LAB — CBC WITH DIFFERENTIAL/PLATELET
Basophils Absolute: 0.1 10*3/uL (ref 0.0–0.2)
Basos: 1 %
EOS (ABSOLUTE): 0.3 10*3/uL (ref 0.0–0.4)
Eos: 4 %
Hematocrit: 35.8 % — ABNORMAL LOW (ref 37.5–51.0)
Hemoglobin: 12 g/dL — ABNORMAL LOW (ref 13.0–17.7)
Immature Grans (Abs): 0.1 10*3/uL (ref 0.0–0.1)
Immature Granulocytes: 1 %
Lymphocytes Absolute: 1.1 10*3/uL (ref 0.7–3.1)
Lymphs: 18 %
MCH: 31 pg (ref 26.6–33.0)
MCHC: 33.5 g/dL (ref 31.5–35.7)
MCV: 93 fL (ref 79–97)
Monocytes Absolute: 0.4 10*3/uL (ref 0.1–0.9)
Monocytes: 7 %
Neutrophils Absolute: 4.3 10*3/uL (ref 1.4–7.0)
Neutrophils: 69 %
Platelets: 263 10*3/uL (ref 150–450)
RBC: 3.87 x10E6/uL — ABNORMAL LOW (ref 4.14–5.80)
RDW: 12.6 % (ref 11.6–15.4)
WBC: 6.2 10*3/uL (ref 3.4–10.8)

## 2022-07-13 LAB — LIPID PANEL
Chol/HDL Ratio: 3.7 ratio (ref 0.0–5.0)
Cholesterol, Total: 146 mg/dL (ref 100–199)
HDL: 40 mg/dL (ref >39)
LDL Chol Calc (NIH): 93 mg/dL (ref 0–99)
Triglycerides: 67 mg/dL (ref 0–149)
VLDL Cholesterol Cal: 13 mg/dL (ref 5–40)

## 2022-07-13 LAB — PSA, TOTAL AND FREE
PSA, Free Pct: 48.2 %
PSA, Free: 3.13 ng/mL
Prostate Specific Ag, Serum: 6.5 ng/mL — ABNORMAL HIGH (ref 0.0–4.0)

## 2022-07-17 DIAGNOSIS — N401 Enlarged prostate with lower urinary tract symptoms: Secondary | ICD-10-CM | POA: Diagnosis not present

## 2022-07-17 DIAGNOSIS — R351 Nocturia: Secondary | ICD-10-CM | POA: Diagnosis not present

## 2022-07-17 DIAGNOSIS — R972 Elevated prostate specific antigen [PSA]: Secondary | ICD-10-CM | POA: Diagnosis not present

## 2022-08-09 ENCOUNTER — Other Ambulatory Visit: Payer: Self-pay | Admitting: Neurosurgery

## 2022-08-09 DIAGNOSIS — I671 Cerebral aneurysm, nonruptured: Secondary | ICD-10-CM

## 2022-09-01 ENCOUNTER — Other Ambulatory Visit: Payer: Self-pay | Admitting: Nurse Practitioner

## 2022-09-01 DIAGNOSIS — I10 Essential (primary) hypertension: Secondary | ICD-10-CM

## 2022-09-03 ENCOUNTER — Ambulatory Visit
Admission: RE | Admit: 2022-09-03 | Discharge: 2022-09-03 | Disposition: A | Discharge status: Home / Self Care | Payer: PPO | Source: Ambulatory Visit | Attending: Neurosurgery | Admitting: Neurosurgery

## 2022-09-03 DIAGNOSIS — I7121 Aneurysm of the ascending aorta, without rupture: Secondary | ICD-10-CM | POA: Diagnosis not present

## 2022-09-03 DIAGNOSIS — I671 Cerebral aneurysm, nonruptured: Secondary | ICD-10-CM

## 2022-09-03 MED ORDER — IOPAMIDOL (ISOVUE-300) INJECTION 61%
50.0000 mL | Freq: Once | INTRAVENOUS | Status: AC | PRN
  Administered 2022-09-03: 50 mL via INTRAVENOUS

## 2022-09-04 DIAGNOSIS — H40033 Anatomical narrow angle, bilateral: Secondary | ICD-10-CM | POA: Diagnosis not present

## 2022-09-04 DIAGNOSIS — E119 Type 2 diabetes mellitus without complications: Secondary | ICD-10-CM | POA: Diagnosis not present

## 2022-09-04 LAB — HM DIABETES EYE EXAM

## 2022-09-13 ENCOUNTER — Other Ambulatory Visit: Payer: Self-pay | Admitting: Nurse Practitioner

## 2022-09-13 ENCOUNTER — Other Ambulatory Visit: Payer: PPO

## 2022-09-20 DIAGNOSIS — I671 Cerebral aneurysm, nonruptured: Secondary | ICD-10-CM | POA: Diagnosis not present

## 2022-11-28 ENCOUNTER — Other Ambulatory Visit: Payer: Self-pay | Admitting: Pharmacist

## 2022-11-28 NOTE — Progress Notes (Signed)
Pharmacy Quality Measure Review  This patient is appearing on a report for being at risk of failing the adherence measure for cholesterol (statin) medications this calendar year.   Medication: atorvastatin 10 mg  Last fill date: 07/12/22 for 90 day supply  Patient reports muscle aches when taking atorvastatin daily. Reports he has been taking it only twice a week for about a year now, although RX is written for daily. Insurance report was not up to date. Will forward to CPP for follow up.   Adam Phenix, PharmD PGY-1 Pharmacy Resident

## 2022-11-30 ENCOUNTER — Other Ambulatory Visit: Payer: Self-pay | Admitting: Nurse Practitioner

## 2022-11-30 DIAGNOSIS — I10 Essential (primary) hypertension: Secondary | ICD-10-CM

## 2022-12-01 ENCOUNTER — Telehealth: Payer: Self-pay | Admitting: Pharmacist

## 2022-12-01 DIAGNOSIS — E1169 Type 2 diabetes mellitus with other specified complication: Secondary | ICD-10-CM

## 2022-12-01 NOTE — Telephone Encounter (Signed)
    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 He is currently taking statin twice weekly with ezetimibe, however statin RX is written for daily.  We will follow up with patient and address his needs in clinic if he is amenable.  [id number] placed for Pharmacy.   Kieth Brightly, PharmD, BCACP Clinical Pharmacist, [facility]

## 2022-12-03 ENCOUNTER — Telehealth: Payer: Self-pay | Admitting: Pharmacist

## 2022-12-03 ENCOUNTER — Telehealth: Payer: Self-pay

## 2022-12-03 NOTE — Progress Notes (Signed)
   Care Guide Note  12/03/2022 Name: [name] MRN: [id number] DOB: 1945/12/28  Referred by: Bennie Pierini, FNP Reason for referral : Care Coordination (Outreach to schedule with Pharm d )   Peter York is a 77 y.o. year old male who is a primary care patient of Bennie Pierini, FNP. FIRAS REEM was referred to the pharmacist for assistance related to HLD.    Successful contact was made with the patient to discuss pharmacy services. Patient declines engagement at this time. Contact information was provided to the patient should they wish to reach out for assistance at a later time.  Penne Lash, RMA Care Guide [facility]  [place], [place] [address] Direct Dial: [phone] [email or web address]

## 2022-12-03 NOTE — Telephone Encounter (Signed)
   This patient is appearing on a report for being at risk of failing the adherence measure for cholesterol (statin) medications this calendar year.   Medication: atorvastatin 10 mg  Last fill date: 07/12/22 for 90 day supply  [id number] was placed for HLD Pharmacy consult, however patient declined assistance from Pharmacy. LDL goal noted by cardiology is set at LDL<100, however patient has Type 2 Diabetes and would need to aim for LDL goal <70.  Routed concern to PCP.  No further follow up necessary from Pharmacy.   Kieth Brightly, PharmD, BCACP Clinical Pharmacist, [facility]

## 2023-01-15 DIAGNOSIS — R972 Elevated prostate specific antigen [PSA]: Secondary | ICD-10-CM | POA: Diagnosis not present

## 2023-01-18 ENCOUNTER — Encounter: Payer: Self-pay | Admitting: Nurse Practitioner

## 2023-01-18 ENCOUNTER — Ambulatory Visit (INDEPENDENT_AMBULATORY_CARE_PROVIDER_SITE_OTHER): Payer: PPO | Admitting: Nurse Practitioner

## 2023-01-18 VITALS — BP 149/76 | HR 54 | Temp 97.8°F | Resp 20 | Ht 66.0 in | Wt 151.0 lb

## 2023-01-18 DIAGNOSIS — I6930 Unspecified sequelae of cerebral infarction: Secondary | ICD-10-CM | POA: Diagnosis not present

## 2023-01-18 DIAGNOSIS — E1169 Type 2 diabetes mellitus with other specified complication: Secondary | ICD-10-CM | POA: Diagnosis not present

## 2023-01-18 DIAGNOSIS — E785 Hyperlipidemia, unspecified: Secondary | ICD-10-CM | POA: Diagnosis not present

## 2023-01-18 DIAGNOSIS — E119 Type 2 diabetes mellitus without complications: Secondary | ICD-10-CM

## 2023-01-18 DIAGNOSIS — I10 Essential (primary) hypertension: Secondary | ICD-10-CM | POA: Diagnosis not present

## 2023-01-18 LAB — BAYER DCA HB A1C WAIVED: HB A1C (BAYER DCA - WAIVED): 6.3 % — ABNORMAL HIGH (ref 4.8–5.6)

## 2023-01-18 MED ORDER — ATORVASTATIN CALCIUM 10 MG PO TABS: 10.0000 mg | ORAL_TABLET | Freq: Every day | ORAL | 1 refills | Status: DC

## 2023-01-18 MED ORDER — AMLODIPINE BESYLATE 10 MG PO TABS: 10.0000 mg | ORAL_TABLET | Freq: Every day | ORAL | 1 refills | Status: DC

## 2023-01-18 MED ORDER — GLIMEPIRIDE 4 MG PO TABS: 4.0000 mg | ORAL_TABLET | Freq: Every day | ORAL | 1 refills | Status: DC

## 2023-01-18 MED ORDER — LOSARTAN POTASSIUM 100 MG PO TABS: 100.0000 mg | ORAL_TABLET | Freq: Every day | ORAL | 1 refills | Status: DC

## 2023-01-18 MED ORDER — EZETIMIBE 10 MG PO TABS: 10.0000 mg | ORAL_TABLET | Freq: Every day | ORAL | 1 refills | Status: DC

## 2023-01-18 NOTE — Patient Instructions (Signed)

## 2023-01-18 NOTE — Progress Notes (Signed)
Subjective:    Patient ID: Peter York, male    DOB: 1945/10/18, 77 y.o.   MRN: [id number]   Chief Complaint: medical management of chronic issues     HPI:  Peter York is a 77 y.o. who identifies as a male who was assigned male at birth.   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 Hyperlipidemia associated with type 2 diabetes mellitus (HCC) Does wathc diet and does exercise daily. Lab Results  Component Value Date   CHOL 146 07/12/2022   HDL 40 07/12/2022   LDLCALC 93 07/12/2022   TRIG 67 07/12/2022   CHOLHDL 3.7 07/12/2022     2. Primary hypertension No c/o chest pain, sob or headache. Does not check  blood pressure very often at home. BP Readings from Last 3 Encounters:  07/12/22 126/66  04/10/22 129/75  02/20/22 129/72     3. Type 2 diabetes mellitus without complication, without long-term current use of insulin (HCC) Fasting blood sugars are running around 130 on average. No low blood sugars. Lab Results  Component Value Date   HGBA1C 6.1 (H) 07/12/2022     4. Late effect of cerebrovascular accident (CVA) No permanent effects   New complaints: None today  Allergies  Allergen Reactions   Crestor [Rosuvastatin Calcium] Other (See Comments)    "like to killed me." joint pain, unable to get OOB   Benazepril Other (See Comments)    hyperkalemia   Outpatient Encounter Medications as of 01/18/2023  Medication Sig   amLODipine (NORVASC) 10 MG tablet Take 1 tablet (10 mg total) by mouth daily.   atorvastatin (LIPITOR) 10 MG tablet Take 1 tablet (10 mg total) by mouth daily.   Calcium Carbonate-Vitamin D (CALCIUM-D PO) Take 1 tablet by mouth at bedtime.   colchicine 0.6 MG tablet Take 0.6 mg by mouth daily as needed (gout pain).   docusate sodium (COLACE) 100 MG capsule Take 1 capsule (100 mg total) by mouth 2 (two) times daily.   ezetimibe  (ZETIA) 10 MG tablet Take 1 tablet (10 mg total) by mouth daily.   fluticasone (FLONASE) 50 MCG/ACT nasal spray SPRAY 2 SPRAYS INTO EACH NOSTRIL EVERY DAY   glimepiride (AMARYL) 4 MG tablet Take 1 tablet (4 mg total) by mouth daily with breakfast.   hydrALAZINE (APRESOLINE) 25 MG tablet TAKE 1 TABLET BY MOUTH THREE TIMES A DAY   losartan (COZAAR) 100 MG tablet Take 1 tablet (100 mg total) by mouth daily.   ONETOUCH ULTRA TEST test strip CHECK BLOOD SUGAR DAILY DX E11.9   Specialty Vitamins Products (PROSTATE PO) Take 1 tablet by mouth daily. Super Beta Prostate   vitamin E 400 UNIT capsule Take 1 capsule (400 Units total) by mouth daily.   No facility-administered encounter medications on file as of 01/18/2023.    Past Surgical History:  Procedure Laterality Date   ANAL FISTULECTOMY  10/02/2011   Procedure: FISTULECTOMY ANAL;  Surgeon: Clovis Pu. Cornett, MD;  Location: [facility];  Service: General;  Laterality: N/A;  excision perianal mass    APPENDECTOMY     COLONOSCOPY W/ POLYPECTOMY  12/31/2005   Dr. Stan Head   HERNIA REPAIR     Repaired 02/05/2022   INGUINAL HERNIA REPAIR Right 02/05/2022   Procedure: HERNIA REPAIR INGUINAL ADULT W/ MESH;  Surgeon: Lewie Chamber, DO;  Location: [facility];  Service: General;  Laterality: Right;   UMBILICAL HERNIA REPAIR      Family History  Problem Relation Age of Onset   Colon cancer Father 38   Colon polyps Father    Cancer Father 38       Colon   Heart attack Maternal Uncle        X 4   Heart disease Maternal Uncle    Heart disease Maternal Uncle    Heart disease Maternal Uncle    Diabetes Daughter 88       Type 1   Transient ischemic attack Brother 75      Controlled substance contract: n/a     Review of Systems  Constitutional:  Negative for diaphoresis.  Eyes:  Negative for pain.  Respiratory:  Negative for shortness of breath.   Cardiovascular:  Negative for chest pain, palpitations and leg  swelling.  Gastrointestinal:  Negative for abdominal pain.  Endocrine: Negative for polydipsia.  Skin:  Negative for rash.  Neurological:  Negative for dizziness, weakness and headaches.  Hematological:  Does not bruise/bleed easily.  All other systems reviewed and are negative.      Objective:   Physical Exam Vitals and nursing note reviewed.  Constitutional:      Appearance: Normal appearance. He is well-developed.  HENT:     Head: Normocephalic.     Nose: Nose normal.     Mouth/Throat:     Mouth: Mucous membranes are moist.     Pharynx: Oropharynx is clear.  Eyes:     Pupils: Pupils are equal, round, and reactive to light.  Neck:     Thyroid: No thyroid mass or thyromegaly.     Vascular: No carotid bruit or JVD.     Trachea: Phonation normal.  Cardiovascular:     Rate and Rhythm: Normal rate and regular rhythm.  Pulmonary:     Effort: Pulmonary effort is normal. No respiratory distress.     Breath sounds: Normal breath sounds.  Abdominal:     General: Bowel sounds are normal.     Palpations: Abdomen is soft.     Tenderness: There is no abdominal tenderness.  Musculoskeletal:        General: Normal range of motion.     Cervical back: Normal range of motion and neck supple.  Lymphadenopathy:     Cervical: No cervical adenopathy.  Skin:    General: Skin is warm and dry.  Neurological:     Mental Status: He is alert and oriented to person, place, and time.  Psychiatric:        Behavior: Behavior normal.        Thought Content: Thought content normal.        Judgment: Judgment normal.    BP (!) 149/76   Pulse (!) 54   Temp 97.8 F (36.6 C) (Temporal)   Resp 20   Ht 5\' 6"  (1.676 m)   Wt 151 lb (68.5 kg)   SpO2 100%   BMI 24.37 kg/m   HGBA1c 6.3%       Assessment & Plan:   NURI BILLEY comes in today with chief complaint of Medical Management of Chronic Issues   Diagnosis and orders addressed:  1. Hyperlipidemia associated with type 2 diabetes  mellitus (HCC) Low fat diet - CBC with Differential/Platelet - CMP14+EGFR - Lipid panel - Microalbumin / creatinine urine ratio - Bayer DCA Hb A1c Waived - atorvastatin (LIPITOR) 10 MG tablet; Take 1 tablet (10 mg total) by mouth daily.  Dispense:  90 tablet; Refill: 1 - ezetimibe (ZETIA) 10 MG tablet; Take 1 tablet (10 mg total) by mouth daily.  Dispense: 90 tablet; Refill: 1  2. Primary hypertension Low sodium diet - CBC with Differential/Platelet - CMP14+EGFR - Lipid panel - Microalbumin / creatinine urine ratio - Bayer DCA Hb A1c Waived - amLODipine (NORVASC) 10 MG tablet; Take 1 tablet (10 mg total) by mouth daily.  Dispense: 90 tablet; Refill: 1 - losartan (COZAAR) 100 MG tablet; Take 1 tablet (100 mg total) by mouth daily.  Dispense: 90 tablet; Refill: 1  3. Type 2 diabetes mellitus without complication, without long-term current use of insulin (HCC) Continue to watch carbs in diet - CBC with Differential/Platelet - CMP14+EGFR - Lipid panel - Microalbumin / creatinine urine ratio - Bayer DCA Hb A1c Waived - glimepiride (AMARYL) 4 MG tablet; Take 1 tablet (4 mg total) by mouth daily with breakfast.  Dispense: 90 tablet; Refill: 1  4. Late effect of cerebrovascular accident (CVA)    Labs pending Health Maintenance reviewed Diet and exercise encouraged  Follow up plan: 6 months   Mary-Margaret Daphine Deutscher, FNP

## 2023-01-19 LAB — CMP14+EGFR
ALT: 19 IU/L (ref 0–44)
AST: 23 [IU]/L (ref 0–40)
Albumin: 4.7 g/dL (ref 3.8–4.8)
Alkaline Phosphatase: 68 IU/L (ref 44–121)
BUN/Creatinine Ratio: 16 (ref 10–24)
BUN: 28 mg/dL — ABNORMAL HIGH (ref 8–27)
Bilirubin Total: 0.7 mg/dL (ref 0.0–1.2)
CO2: 22 mmol/L (ref 20–29)
Calcium: 9.6 mg/dL (ref 8.6–10.2)
Chloride: 104 mmol/L (ref 96–106)
Creatinine, Ser: 1.76 mg/dL — ABNORMAL HIGH (ref 0.76–1.27)
Globulin, Total: 2.8 g/dL (ref 1.5–4.5)
Glucose: 106 mg/dL — ABNORMAL HIGH (ref 70–99)
Potassium: 4.6 mmol/L (ref 3.5–5.2)
Sodium: 141 mmol/L (ref 134–144)
Total Protein: 7.5 g/dL (ref 6.0–8.5)
eGFR: 39 mL/min/{1.73_m2} — ABNORMAL LOW (ref >59)

## 2023-01-19 LAB — MICROALBUMIN / CREATININE URINE RATIO
Creatinine, Urine: 88.3 mg/dL
Microalb/Creat Ratio: 90 mg/g{creat} — ABNORMAL HIGH (ref 0–29)
Microalbumin, Urine: 79.9 ug/mL

## 2023-01-19 LAB — LIPID PANEL
Chol/HDL Ratio: 3 ratio (ref 0.0–5.0)
Cholesterol, Total: 135 mg/dL (ref 100–199)
HDL: 45 mg/dL (ref >39)
LDL Chol Calc (NIH): 73 mg/dL (ref 0–99)
Triglycerides: 90 mg/dL (ref 0–149)
VLDL Cholesterol Cal: 17 mg/dL (ref 5–40)

## 2023-01-19 LAB — CBC WITH DIFFERENTIAL/PLATELET
Basophils Absolute: 0.1 10*3/uL (ref 0.0–0.2)
Basos: 1 %
EOS (ABSOLUTE): 0.4 10*3/uL (ref 0.0–0.4)
Eos: 7 %
Hematocrit: 39 % (ref 37.5–51.0)
Hemoglobin: 13.3 g/dL (ref 13.0–17.7)
Immature Grans (Abs): 0 10*3/uL (ref 0.0–0.1)
Immature Granulocytes: 0 %
Lymphocytes Absolute: 1.2 10*3/uL (ref 0.7–3.1)
Lymphs: 22 %
MCH: 32.5 pg (ref 26.6–33.0)
MCHC: 34.1 g/dL (ref 31.5–35.7)
MCV: 95 fL (ref 79–97)
Monocytes Absolute: 0.4 10*3/uL (ref 0.1–0.9)
Monocytes: 7 %
Neutrophils Absolute: 3.7 10*3/uL (ref 1.4–7.0)
Neutrophils: 63 %
Platelets: 266 10*3/uL (ref 150–450)
RBC: 4.09 x10E6/uL — ABNORMAL LOW (ref 4.14–5.80)
RDW: 12.5 % (ref 11.6–15.4)
WBC: 5.7 10*3/uL (ref 3.4–10.8)

## 2023-01-22 DIAGNOSIS — N401 Enlarged prostate with lower urinary tract symptoms: Secondary | ICD-10-CM | POA: Diagnosis not present

## 2023-01-22 DIAGNOSIS — R972 Elevated prostate specific antigen [PSA]: Secondary | ICD-10-CM | POA: Diagnosis not present

## 2023-01-22 DIAGNOSIS — R351 Nocturia: Secondary | ICD-10-CM | POA: Diagnosis not present

## 2023-03-06 ENCOUNTER — Other Ambulatory Visit: Payer: Self-pay | Admitting: Nurse Practitioner

## 2023-03-06 DIAGNOSIS — I10 Essential (primary) hypertension: Secondary | ICD-10-CM

## 2023-03-18 ENCOUNTER — Ambulatory Visit: Payer: PPO

## 2023-03-18 VITALS — Ht 66.0 in | Wt 151.0 lb

## 2023-03-18 DIAGNOSIS — Z0000 Encounter for general adult medical examination without abnormal findings: Secondary | ICD-10-CM

## 2023-03-18 NOTE — Progress Notes (Signed)
Subjective:   Peter York is a 77 y.o. male who presents for Medicare Annual/Subsequent preventive examination.  Visit Complete: Virtual I connected with  Albertha Ghee on 03/18/23 by a audio enabled telemedicine application and verified that I am speaking with the correct person using two identifiers.  Patient Location: Home  Provider Location: Home Office  I discussed the limitations of evaluation and management by telemedicine. The patient expressed understanding and agreed to proceed.  Vital Signs: Because this visit was a virtual/telehealth visit, some criteria may be missing or patient reported. Any vitals not documented were not able to be obtained and vitals that have been documented are patient reported.  Patient Medicare AWV questionnaire was completed by the patient on 03/17/23; I have confirmed that all information answered by patient is correct and no changes since this date.  Cardiac Risk Factors include: advanced age (>32men, >48 women);diabetes mellitus;dyslipidemia;hypertension;male gender     Objective:    Today's Vitals   03/18/23 0918  Weight: 151 lb (68.5 kg)  Height: 5\' 6"  (1.676 m)   Body mass index is 24.37 kg/m.     03/18/2023    9:27 AM 03/12/2022   12:39 PM 02/01/2022    2:06 PM 02/08/2021    2:07 PM 02/08/2020   10:17 AM 02/05/2019    3:49 PM 07/01/2018    5:01 PM  Advanced Directives  Does Patient Have a Medical Advance Directive? No Yes No No No No   Type of Advance Directive  Living will;Healthcare Power of Attorney       Does patient want to make changes to medical advance directive?  No - Patient declined       Copy of Healthcare Power of Attorney in Chart?  No - copy requested       Would patient like information on creating a medical advance directive? Yes (MAU/Ambulatory/Procedural Areas - Information given)  No - Patient declined No - Patient declined No - Patient declined No - Patient declined No - Patient declined    Current  Medications (verified) Outpatient Encounter Medications as of 03/18/2023  Medication Sig   amLODipine (NORVASC) 10 MG tablet Take 1 tablet (10 mg total) by mouth daily.   atorvastatin (LIPITOR) 10 MG tablet Take 1 tablet (10 mg total) by mouth daily.   Calcium Carbonate-Vitamin D (CALCIUM-D PO) Take 1 tablet by mouth at bedtime.   ezetimibe (ZETIA) 10 MG tablet Take 1 tablet (10 mg total) by mouth daily.   fluticasone (FLONASE) 50 MCG/ACT nasal spray SPRAY 2 SPRAYS INTO EACH NOSTRIL EVERY DAY   glimepiride (AMARYL) 4 MG tablet Take 1 tablet (4 mg total) by mouth daily with breakfast.   hydrALAZINE (APRESOLINE) 25 MG tablet TAKE 1 TABLET BY MOUTH THREE TIMES A DAY   losartan (COZAAR) 100 MG tablet Take 1 tablet (100 mg total) by mouth daily.   ONETOUCH ULTRA TEST test strip CHECK BLOOD SUGAR DAILY DX E11.9   Specialty Vitamins Products (PROSTATE PO) Take 1 tablet by mouth daily. Super Beta Prostate   vitamin E 400 UNIT capsule Take 1 capsule (400 Units total) by mouth daily.   colchicine 0.6 MG tablet Take 0.6 mg by mouth daily as needed (gout pain). (Patient not taking: Reported on 01/18/2023)   No facility-administered encounter medications on file as of 03/18/2023.    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 Cornett, MD;  Location: [facility];  Service: General;  Laterality: N/A;  excision perianal mass    APPENDECTOMY     COLONOSCOPY W/  POLYPECTOMY  12/31/2005   Dr. Stan Head   HERNIA REPAIR     Repaired 02/05/2022   INGUINAL HERNIA REPAIR Right 02/05/2022   Procedure: HERNIA REPAIR INGUINAL ADULT W/ MESH;  Surgeon: Lewie Chamber, DO;  Location: [facility];  Service: General;  Laterality: Right;   UMBILICAL HERNIA REPAIR     Family History  Problem Relation Age of Onset   Colon cancer Father 101   Colon polyps Father    Cancer Father 47       Colon   Heart attack Maternal Uncle        X 4   Heart disease Maternal Uncle    Heart disease Maternal Uncle    Heart disease Maternal Uncle    Diabetes Daughter 89       Type 1   Transient ischemic attack Brother 21   Social History   Socioeconomic History   Marital status: Married    Spouse name: Okey Regal   Number of children: 3   Years of education: 14   Highest education level: Some college, no degree  Occupational History   Occupation: retired  Tobacco Use   Smoking status: Former    Current packs/day: 0.00    Average packs/day: 0.8 packs/day for 18.8 years (15.1 ttl pk-yrs)    Types: Cigarettes    Start date: 03/27/1963    Quit date: 03/26/1978    Years since quitting: 45.0   Smokeless tobacco: Never  Vaping Use   Vaping status: Never Used  Substance and Sexual Activity   Alcohol use: Yes    Comment: cocktail once a month   Drug use: No   Sexual activity: Yes  Other Topics Concern   Not on file  Social History Narrative   Very active and independent - trains bird dogs, raises horses, etc   Retired Health and safety inspector   Married 1 son, 2 daughters (son in [place])   grandkids and greatgrandkids   No EtOH, former smoker and no drugs   Social Drivers of Corporate investment banker Strain: Low Risk  (03/18/2023)   Overall Financial Resource Strain (CARDIA)    Difficulty of Paying Living Expenses: Not hard at all  Food Insecurity: No Food Insecurity (03/18/2023)   Hunger Vital Sign    Worried About Running Out of Food in the Last Year: Never true     Ran Out of Food in the Last Year: Never true  Transportation Needs: No Transportation Needs (03/18/2023)   PRAPARE - Administrator, Civil Service (Medical): No    Lack of Transportation (Non-Medical): No  Physical Activity: Sufficiently Active (03/18/2023)   Exercise Vital Sign    Days of Exercise per Week: 7 days    Minutes of Exercise per Session: 30 min  Stress: No Stress Concern Present (03/18/2023)   [employer] of Occupational Health - Occupational Stress Questionnaire  Feeling of Stress : Not at all  Social Connections: Socially Integrated (03/18/2023)   Social Connection and Isolation Panel [NHANES]    Frequency of Communication with Friends and Family: More than three times a week    Frequency of Social Gatherings with Friends and Family: More than three times a week    Attends Religious Services: 1 to 4 times per year    Active Member of [employer] or Organizations: No    Attends Engineer, structural: More than 4 times per year    Marital Status: Married    Tobacco Counseling Counseling given: Not Answered   Clinical Intake:  Pre-visit preparation completed: Yes  Pain : No/denies pain  Diabetes: No  How often do you need to have someone help you when you read instructions, pamphlets, or other written materials from your doctor or pharmacy?: 1 - Never  Interpreter Needed?: No  Information entered by :: Kandis Fantasia LPN   Activities of Daily Living    03/17/2023    8:47 AM  In your present state of health, do you have any difficulty performing the following activities:  Hearing? 1  Vision? 0  Difficulty concentrating or making decisions? 0  Walking or climbing stairs? 0  Dressing or bathing? 0  Doing errands, shopping? 0  Preparing Food and eating ? N  Using the Toilet? N  In the past six months, have you accidently leaked urine? N  Do you have problems with loss of bowel control? N  Managing your Medications? N  Managing your  Finances? N  Housekeeping or managing your Housekeeping? N    Patient Care Team: Bennie Pierini, FNP as PCP - General (Family Medicine) Quintella Reichert, MD as PCP - Cardiology (Cardiology) Iva Boop, MD as Consulting Physician (Gastroenterology) Michaelle Copas, MD as Referring Physician (Optometry) Nelson Chimes, MD as Consulting Physician (Ophthalmology) Coletta Memos, MD as Consulting Physician (Neurosurgery)  Indicate any recent Medical Services you may have received from other than [facility] providers in the past year (date may be approximate).     Assessment:   This is a routine wellness examination for M[place]in[place].  Hearing/Vision screen Hearing Screening - Comments:: Denies hearing difficulties   Vision Screening - Comments:: Wears rx glasses - up to date with routine eye exams with Dr. Conley Rolls     Goals Addressed             This Visit's Progress    COMPLETED: Patient Stated       02/08/2020 AWV Goal: Fall Prevention  Over the next year, patient will decrease their risk for falls by: Using assistive devices, such as a cane or walker, as needed Identifying fall risks within their home and correcting them by: Removing throw rugs Adding handrails to stairs or ramps Removing clutter and keeping a clear pathway throughout the home Increasing light, especially at night Adding shower handles/bars Raising toilet seat Identifying potential personal risk factors for falls: Medication side effects Incontinence/urgency Vestibular dysfunction Hearing loss Musculoskeletal disorders Neurological disorders Orthostatic hypotension        Depression Screen    03/18/2023    9:26 AM 01/18/2023    8:02 AM 07/12/2022    8:21 AM 04/10/2022    8:22 AM 03/12/2022   12:38 PM 12/25/2021    8:46 AM 09/28/2021    7:59 AM  PHQ 2/9 Scores  PHQ - 2 Score 0 0 0 0 0 0 0  PHQ- 9 Score 0 0 0 0  0  0    Fall Risk    03/18/2023    9:27 AM 03/17/2023    8:47 AM 01/18/2023    8:02  AM 07/12/2022    8:21 AM 04/10/2022    8:21 AM  Fall Risk   Falls in the past year? 0 0 0 0 0  Number falls in past yr: 0      Injury with Fall? 0      Risk for fall due to : No Fall Risks      Follow up Falls prevention discussed;Education provided;Falls evaluation completed        MEDICARE RISK AT HOME: Medicare Risk at Home Any stairs in or around the home?: (Patient-Rptd) Yes If so, are there any without handrails?: (Patient-Rptd) No Home free of loose throw rugs in walkways, pet beds, electrical cords, etc?: (Patient-Rptd) Yes Adequate lighting in your home to reduce risk of falls?: (Patient-Rptd) Yes Life alert?: (Patient-Rptd) No Use of a cane, walker or w/c?: (Patient-Rptd) No Grab bars in the bathroom?: (Patient-Rptd) No Shower chair or bench in shower?: (Patient-Rptd) No Elevated toilet seat or a handicapped toilet?: (Patient-Rptd) No  TIMED UP AND GO:  Was the test performed?  No    Cognitive Function:    07/22/2017    9:06 AM 07/19/2016    2:16 PM  MMSE - Mini Mental State Exam  Orientation to time 5 5  Orientation to Place 5 5  Registration 3 3  Attention/ Calculation 4 5  Recall 2 0  Language- name 2 objects 2 2  Language- repeat 1 1  Language- follow 3 step command 3 3  Language- read & follow direction 1 1  Write a sentence 1 1  Copy design 1 1  Total score 28 27        03/18/2023    9:28 AM 03/12/2022   12:39 PM 02/08/2021    2:17 PM 02/08/2020   10:19 AM 02/05/2019    3:56 PM  6CIT Screen  What Year? 0 points 0 points 0 points 0 points 0 points  What month? 0 points 0 points 0 points  0 points  What time? 0 points 0 points 0 points 0 points 0 points  Count back from 20 0 points 0 points 0 points 0 points 0 points  Months in reverse 0 points 0 points 0 points  0 points  Repeat phrase 0 points 0 points 0 points 0 points 0 points  Total Score 0 points 0 points 0 points  0 points    Immunizations Immunization History  Administered Date(s)  Administered   Pneumococcal Conjugate-13 04/14/2013   Pneumococcal Polysaccharide-23 09/16/2014   Td 01/29/2015    TDAP status: Up to date  Flu Vaccine status: Declined, Education has been provided regarding the importance of this vaccine but patient still declined. Advised may receive this vaccine at local pharmacy or Health Dept. Aware to provide a copy of the vaccination record if obtained from local pharmacy or Health Dept. Verbalized acceptance and understanding.  Pneumococcal vaccine status: Up to date  Covid-19 vaccine status: Declined, Education has been provided regarding the importance of this vaccine but patient still declined. Advised may receive this vaccine at local pharmacy or Health Dept.or vaccine clinic. Aware to provide a copy of the vaccination record if obtained from local pharmacy or Health Dept. Verbalized acceptance and understanding.  Qualifies for Shingles Vaccine? Yes   Zostavax completed No   Shingrix Completed?: No.    Education has been provided regarding  the importance of this vaccine. Patient has been advised to call insurance company to determine out of pocket expense if they have not yet received this vaccine. Advised may also receive vaccine at local pharmacy or Health Dept. Verbalized acceptance and understanding.  Screening Tests Health Maintenance  Topic Date Due   COVID-19 Vaccine (1 - 2024-25 season) Never done   Zoster Vaccines- Shingrix (1 of 2) 04/20/2023 (Originally 05/21/1995)   INFLUENZA VACCINE  06/24/2023 (Originally 10/25/2022)   HEMOGLOBIN A1C  04/20/2023   OPHTHALMOLOGY EXAM  09/04/2023   Diabetic kidney evaluation - eGFR measurement  01/18/2024   Diabetic kidney evaluation - Urine ACR  01/18/2024   FOOT EXAM  01/18/2024   Medicare Annual Wellness (AWV)  03/17/2024   DTaP/Tdap/Td (2 - Tdap) 01/28/2025   Pneumonia Vaccine 43+ Years old  Completed   Hepatitis C Screening  Completed   HPV VACCINES  Aged Out   Colonoscopy  Discontinued     Health Maintenance  Health Maintenance Due  Topic Date Due   COVID-19 Vaccine (1 - 2024-25 season) Never done    Colorectal cancer screening: No longer required.   Lung Cancer Screening: (Low Dose CT Chest recommended if Age 8-80 years, 20 pack-year currently smoking OR have quit w/in 15years.) does not qualify.   Lung Cancer Screening Referral: n/a  Additional Screening:  Hepatitis C Screening: does qualify; Completed 03/15/15  Vision Screening: Recommended annual ophthalmology exams for early detection of glaucoma and other disorders of the eye. Is the patient up to date with their annual eye exam?  Yes  Who is the provider or what is the name of the office in which the patient attends annual eye exams? Dr. Conley Rolls  If pt is not established with a provider, would they like to be referred to a provider to establish care? No .   Dental Screening: Recommended annual dental exams for proper oral hygiene  Diabetic Foot Exam: Diabetic Foot Exam: Completed 01/18/23  Community Resource Referral / Chronic Care Management: CRR required this visit?  No   CCM required this visit?  No     Plan:     I have personally reviewed and noted the following in the patient's chart:   Medical and social history Use of alcohol, tobacco or illicit drugs  Current medications and supplements including opioid prescriptions. Patient is not currently taking opioid prescriptions. Functional ability and status Nutritional status Physical activity Advanced directives List of other physicians Hospitalizations, surgeries, and ER visits in previous 12 months Vitals Screenings to include cognitive, depression, and falls Referrals and appointments  In addition, I have reviewed and discussed with patient certain preventive protocols, quality metrics, and best practice recommendations. A written personalized care plan for preventive services as well as general preventive health recommendations were  provided to patient.     Kandis Fantasia [place], [place]   [id number]/[id number]   After Visit Summary: (MyChart) Due to this being a telephonic visit, the after visit summary with patients personalized plan was offered to patient via MyChart   Nurse Notes: No concerns at this time

## 2023-03-18 NOTE — Patient Instructions (Signed)
Mr. Peter York , Thank you for taking time to come for your Medicare Wellness Visit. I appreciate your ongoing commitment to your health goals. Please review the following plan we discussed and let me know if I can assist you in the future.   Referrals/Orders/Follow-Ups/Clinician Recommendations: Aim for 30 minutes of exercise or brisk walking, 6-8 glasses of water, and 5 servings of fruits and vegetables each day.  This is a list of the screening recommended for you and due dates:  Health Maintenance  Topic Date Due   COVID-19 Vaccine (1 - 2024-25 season) Never done   Zoster (Shingles) Vaccine (1 of 2) 04/20/2023*   Flu Shot  06/24/2023*   Hemoglobin A1C  04/20/2023   Eye exam for diabetics  09/04/2023   Yearly kidney function blood test for diabetes  01/18/2024   Yearly kidney health urinalysis for diabetes  01/18/2024   Complete foot exam   01/18/2024   Medicare Annual Wellness Visit  03/17/2024   DTaP/Tdap/Td vaccine (2 - Tdap) 01/28/2025   Pneumonia Vaccine  Completed   Hepatitis C Screening  Completed   HPV Vaccine  Aged Out   Colon Cancer Screening  Discontinued  *Topic was postponed. The date shown is not the original due date.    Advanced directives: (ACP Link)Information on Advanced Care Planning can be found at [facility] [place] Advance Health Care Directives Advance Health Care Directives ([email or web address]   Next Medicare Annual Wellness Visit scheduled for next year: Yes

## 2023-04-09 ENCOUNTER — Ambulatory Visit: Payer: PPO | Attending: Cardiology | Admitting: Cardiology

## 2023-04-09 ENCOUNTER — Encounter: Payer: Self-pay | Admitting: Cardiology

## 2023-04-09 VITALS — BP 138/76 | HR 55 | Ht 68.0 in | Wt 157.6 lb

## 2023-04-09 DIAGNOSIS — E1169 Type 2 diabetes mellitus with other specified complication: Secondary | ICD-10-CM | POA: Diagnosis not present

## 2023-04-09 DIAGNOSIS — E782 Mixed hyperlipidemia: Secondary | ICD-10-CM

## 2023-04-09 DIAGNOSIS — N1831 Chronic kidney disease, stage 3a: Secondary | ICD-10-CM

## 2023-04-09 DIAGNOSIS — Z79899 Other long term (current) drug therapy: Secondary | ICD-10-CM

## 2023-04-09 DIAGNOSIS — I10 Essential (primary) hypertension: Secondary | ICD-10-CM

## 2023-04-09 DIAGNOSIS — E785 Hyperlipidemia, unspecified: Secondary | ICD-10-CM

## 2023-04-09 MED ORDER — ATORVASTATIN CALCIUM 10 MG PO TABS
10.0000 mg | ORAL_TABLET | ORAL | 3 refills | Status: DC
Start: 2023-04-10 — End: 2023-07-22

## 2023-04-09 NOTE — Addendum Note (Signed)
 Addended by: Luellen Pucker on: 04/09/2023 10:37 AM   Modules accepted: Orders

## 2023-04-09 NOTE — Progress Notes (Signed)
 Cardiology Office Note:    Date:  04/09/2023   ID:  Peter York, DOB 1945-11-20, MRN [id number]  PCP:  Gladis Mustard, FNP  Cardiologist:  Wilbert Bihari, MD    Referring MD: Gladis Mustard, *   Chief Complaint  Patient presents with   Hypertension   Hyperlipidemia    History of Present Illness:    Peter York is a 78 y.o. male with a hx of HTN, DM and dyslipidemia.  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 His ASA was stopped and he has essentially regained all his deficits after rehab. He is here today for followup and is doing well.  He denies any chest pain or pressure, SOB, DOE, PND, orthopnea, LE edema, dizziness, palpitations or syncope. He is compliant with his meds and is tolerating meds with no SE.    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 Cornett, MD;  Location: [facility];  Service: General;  Laterality: N/A;  excision perianal mass    APPENDECTOMY     COLONOSCOPY W/ POLYPECTOMY  12/31/2005   Dr. Lupita Commander   HERNIA REPAIR     Repaired 02/05/2022   INGUINAL HERNIA REPAIR Right 02/05/2022   Procedure: HERNIA REPAIR INGUINAL ADULT W/ MESH;  Surgeon: Evonnie Dorothyann LABOR, DO;  Location: [facility];  Service: General;  Laterality: Right;   UMBILICAL HERNIA REPAIR      Current Medications: Current Meds  Medication Sig   amLODipine  (NORVASC ) 10 MG tablet Take 1 tablet (10 mg total) by mouth daily.   atorvastatin  (LIPITOR) 10 MG tablet Take 1 tablet (10 mg total) by  mouth daily.   Calcium  Carbonate-Vitamin D  (CALCIUM -D PO) Take 1 tablet by mouth at bedtime.   colchicine  0.6 MG tablet Take 0.6 mg by mouth daily as needed (gout pain).   ezetimibe  (ZETIA ) 10 MG tablet Take 1 tablet (10 mg total) by mouth daily.   fluticasone  (FLONASE ) 50 MCG/ACT nasal spray SPRAY 2 SPRAYS INTO EACH NOSTRIL EVERY DAY   glimepiride  (AMARYL ) 4 MG tablet Take 1 tablet (4 mg total) by mouth daily with breakfast.   hydrALAZINE  (APRESOLINE ) 25 MG tablet TAKE 1 TABLET BY MOUTH THREE TIMES A DAY   losartan  (COZAAR ) 100 MG tablet Take 1 tablet (100 mg total) by mouth daily.   ONETOUCH ULTRA TEST test strip CHECK BLOOD SUGAR DAILY DX E11.9   Specialty Vitamins Products (PROSTATE PO) Take 1 tablet by mouth daily. Super Beta Prostate   vitamin E  400 UNIT capsule Take 1 capsule (400 Units total) by mouth daily.     Allergies:   Crestor  [rosuvastatin  calcium ] and Benazepril    Social History   Socioeconomic History   Marital status: Married    Spouse name: Niels   Number of children: 3   Years of education: 14   Highest education level: Some college, no degree  Occupational History   Occupation: retired  Tobacco Use   Smoking status: Former    Current packs/day: 0.00  Average packs/day: 0.8 packs/day for 18.8 years (15.1 ttl pk-yrs)    Types: Cigarettes    Start date: 03/27/1963    Quit date: 03/26/1978    Years since quitting: 45.0   Smokeless tobacco: Never  Vaping Use   Vaping status: Never Used  Substance and Sexual Activity   Alcohol use: Yes    Comment: cocktail once a month   Drug use: No   Sexual activity: Yes  Other Topics Concern   Not on file  Social History Narrative   Very active and independent - trains bird dogs, raises horses, etc   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   Married 1 son, 2 daughters (son in [place])   grandkids and greatgrandkids   No EtOH, former smoker and no drugs   Social Drivers of Corporate Investment Banker Strain: Low Risk  (03/18/2023)    Overall Financial Resource Strain (CARDIA)    Difficulty of Paying Living Expenses: Not hard at all  Food Insecurity: No Food Insecurity (03/18/2023)   Hunger Vital Sign    Worried About Running Out of Food in the Last Year: Never true    Ran Out of Food in the Last Year: Never true  Transportation Needs: No Transportation Needs (03/18/2023)   PRAPARE - Administrator, Civil Service (Medical): No    Lack of Transportation (Non-Medical): No  Physical Activity: Sufficiently Active (03/18/2023)   Exercise Vital Sign    Days of Exercise per Week: 7 days    Minutes of Exercise per Session: 30 min  Stress: No Stress Concern Present (03/18/2023)   [employer] of Occupational Health - Occupational Stress Questionnaire    Feeling of Stress : Not at all  Social Connections: Socially Integrated (03/18/2023)   Social Connection and Isolation Panel [NHANES]    Frequency of Communication with Friends and Family: More than three times a week    Frequency of Social Gatherings with Friends and Family: More than three times a week    Attends Religious Services: 1 to 4 times per year    Active Member of [employer] or Organizations: No    Attends Engineer, Structural: More than 4 times per year    Marital Status: Married     Family History: The patient's family history includes Cancer (age of onset: 47) in his father; Colon cancer (age of onset: 33) in his father; Colon polyps in his father; Diabetes (age of onset: 16) in his daughter; Heart attack in his maternal uncle; Heart disease in his maternal uncle, maternal uncle, and maternal uncle; Transient ischemic attack (age of onset: 38) in his brother.  ROS:   Please see the history of present illness.    ROS  All other systems reviewed and negative.   EKGs/Labs/Other Studies Reviewed:    The following studies were reviewed today EKG Interpretation Date/Time:  Tuesday April 09 2023 10:19:07 EST Ventricular Rate:  56 PR  Interval:  164 QRS Duration:  88 QT Interval:  388 QTC Calculation: 374 R Axis:   10  Text Interpretation: Sinus bradycardia Minimal voltage criteria for LVH, may be normal variant ( R in aVL ) When compared with ECG of 29-Jun-2018 09:37, PREVIOUS ECG IS PRESENT Confirmed by Shlomo Corning ([id number]) on 04/09/2023 10:26:55 AM    Recent Labs: 01/18/2023: ALT 19; BUN 28; Creatinine, Ser 1.76; Hemoglobin 13.3; Platelets 266; Potassium 4.6; Sodium 141   Recent Lipid Panel    Physical Exam:    VS:  BP 138/76  Pulse (!) 55   Ht 5' 8 (1.727 m)   Wt 157 lb 9.6 oz (71.5 kg)   SpO2 99%   BMI 23.96 kg/m     Wt Readings from Last 3 Encounters:  04/09/23 157 lb 9.6 oz (71.5 kg)  03/18/23 151 lb (68.5 kg)  01/18/23 151 lb (68.5 kg)     GEN: Well nourished, well developed in no acute distress HEENT: Normal NECK: No JVD; No carotid bruits LYMPHATICS: No lymphadenopathy CARDIAC:RRR, no murmurs, rubs, gallops RESPIRATORY:  Clear to auscultation without rales, wheezing or rhonchi  ABDOMEN: Soft, non-tender, non-distended MUSCULOSKELETAL:  No edema; No deformity  SKIN: Warm and dry NEUROLOGIC:  Alert and oriented x 3 PSYCHIATRIC:  Normal affect  ASSESSMENT:    1. Primary hypertension   2. Mixed hyperlipidemia   3. Stage 3a chronic kidney disease (HCC)    PLAN:    In order of problems listed above:  Hypertension  Renal artery stenosis -Has a history of left renal artery stenosis 1 to 59% by Dopplers 2020 BP is controlled on exam today -Continue prescription drug managed with amlodipine  10 mg daily, hydralazine  25 mg 3 times daily, losartan  100 mg daily with PRN Refills -I have personally reviewed and interpreted outside labs performed by patient's PCP which showed serum creatinine 1.76 potassium 4.6 on 01/18/2023.  His creatinine is at baseline -he has some problems with throat clearing after meals and some mild cough>>suspect this is GERD and not the Losartan  since he is having a  lot of throat clearing particularly after meals>>encouraged him to talk with his PCP  Hyperlipidemia  - his LDL goal is <70 due to renal artery stenosis -I have personally reviewed and interpreted outside labs performed by patient's PCP which showed LDL 73, HDL 45 and triglycerides 90 on 01/18/2023 with ALT 19 -Continue prescription drug management  Zetia  10 mg daily with as needed refills  -he has only been taking his atorvastatin  10mg  2 days weekly so I asked him to increase to 3 times weekly  -Repeat FLP and ALT in 6 weeks  CKD stage 3a  -Serum creatinine baseline is around 1.9 -Serum creatinine was 1.76 on 01/18/2023 - this is followed by his PCP.    Coronary artery calcifications -noted on chest CT 11/2022 with [facility] -will check coronary Ca score to assess future risk   Medication Adjustments/Labs and Tests Ordered: Current medicines are reviewed at length with the patient today.  Concerns regarding medicines are outlined above.  Orders Placed This Encounter  Procedures   EKG 12-Lead   No orders of the defined types were placed in this encounter.   Signed, Wilbert Bihari, MD  04/09/2023 10:23 AM    [facility]

## 2023-04-09 NOTE — Patient Instructions (Signed)
 Medication Instructions:  Please start taking your 10 mg atorvastatin  3 times a week instead of 2 times a week.  *If you need a refill on your cardiac medications before your next appointment, please call your pharmacy*   Lab Work: Please complete a FASTING lipid panel and an ALT at any [facility] in late February 2025.  If you have labs (blood work) drawn today and your tests are completely normal, you will receive your results only by: MyChart Message (if you have MyChart) OR A paper copy in the mail If you have any lab test that is abnormal or we need to change your treatment, we will call you to review the results.   Testing/Procedures: Your physician has ordered a coronary calcium  score CT. This is a test that can provide early detection of coronary artery disease. Patient cost is $94-99.    Follow-Up: At [facility], you and your health needs are our priority.  As part of our continuing mission to provide you with exceptional heart care, we have created designated Provider Care Teams.  These Care Teams include your primary Cardiologist (physician) and Advanced Practice Providers (APPs -  Physician Assistants and Nurse Practitioners) who all work together to provide you with the care you need, when you need it.  We recommend signing up for the patient portal called MyChart.  Sign up information is provided on this After Visit Summary.  MyChart is used to connect with patients for Virtual Visits (Telemedicine).  Patients are able to view lab/test results, encounter notes, upcoming appointments, etc.  Non-urgent messages can be sent to your provider as well.   To learn more about what you can do with MyChart, go to [email or web address].    Your next appointment:   1 year(s)  Provider:   Wilbert Bihari, MD

## 2023-04-09 NOTE — Addendum Note (Signed)
 Addended by: Luellen Pucker on: 04/09/2023 10:41 AM   Modules accepted: Orders

## 2023-04-15 ENCOUNTER — Encounter: Payer: Self-pay | Admitting: Cardiology

## 2023-04-15 ENCOUNTER — Ambulatory Visit (HOSPITAL_BASED_OUTPATIENT_CLINIC_OR_DEPARTMENT_OTHER)
Admission: RE | Admit: 2023-04-15 | Discharge: 2023-04-15 | Disposition: A | Discharge status: Home / Self Care | Payer: Self-pay | Source: Ambulatory Visit | Attending: Cardiology | Admitting: Cardiology

## 2023-04-15 DIAGNOSIS — E782 Mixed hyperlipidemia: Secondary | ICD-10-CM

## 2023-04-15 DIAGNOSIS — E1169 Type 2 diabetes mellitus with other specified complication: Secondary | ICD-10-CM

## 2023-04-15 DIAGNOSIS — E785 Hyperlipidemia, unspecified: Secondary | ICD-10-CM | POA: Insufficient documentation

## 2023-04-15 DIAGNOSIS — R931 Abnormal findings on diagnostic imaging of heart and coronary circulation: Secondary | ICD-10-CM | POA: Insufficient documentation

## 2023-04-19 ENCOUNTER — Telehealth: Payer: Self-pay

## 2023-04-19 ENCOUNTER — Encounter: Payer: Self-pay | Admitting: Cardiology

## 2023-04-19 DIAGNOSIS — R931 Abnormal findings on diagnostic imaging of heart and coronary circulation: Secondary | ICD-10-CM

## 2023-04-19 NOTE — Telephone Encounter (Signed)
-----   Message from Armanda Magic sent at 04/15/2023  3:53 PM EST ----- Actually disregard order for FLP now - his statin was increased and should have FLp ordered in 6 weeks

## 2023-04-19 NOTE — Telephone Encounter (Signed)
Informed Consent   Shared Decision Making/Informed Consent unknown Hello yeah hey oh okay yeah it is fine no no The risks [chest pain, shortness of breath, cardiac arrhythmias, dizziness, blood pressure fluctuations, myocardial infarction, stroke/transient ischemic attack, nausea, vomiting, allergic reaction, radiation exposure, metallic taste sensation and life-threatening complications (estimated to be 1 in 10,000)], benefits (risk stratification, diagnosing coronary artery disease, treatment guidance) and alternatives of a cardiac PET stress test were discussed in detail with Peter York and he agrees to proceed.

## 2023-04-19 NOTE — Telephone Encounter (Signed)
Call to patient to explain coronary Ca score very high at 3673. Dr. Mayford Knife advises that heget a stress PET CT to rule out ischemia.  Instructions sent via MyChart. Patient verbalizes understanding and verifies his statin was recently increased. Patient questions starting ASA 81mg  daily due to his history of stroke, he says he was on 81 mg aspirin previously and was taken off it. Patient responses forwarded to Dr. Mayford Knife.

## 2023-04-22 ENCOUNTER — Telehealth (HOSPITAL_COMMUNITY): Payer: Self-pay | Admitting: *Deleted

## 2023-04-22 NOTE — Telephone Encounter (Signed)
Reaching out to patient to offer assistance regarding upcoming cardiac imaging study; pt verbalizes understanding of appt date/time, parking situation and where to check in, pre-test NPO status and verified current allergies; name and call back number provided for further questions should they arise  Larey Brick RN Navigator Cardiac Imaging [facility] [phone] office [phone] cell  Patient aware to avoid caffeine 12 hours prior to his cardiac PET scan.

## 2023-04-23 ENCOUNTER — Ambulatory Visit (HOSPITAL_COMMUNITY)
Admission: RE | Admit: 2023-04-23 | Discharge: 2023-04-23 | Disposition: A | Discharge status: Home / Self Care | Payer: PPO | Source: Ambulatory Visit | Attending: Cardiology | Admitting: Cardiology

## 2023-04-23 ENCOUNTER — Encounter: Payer: Self-pay | Admitting: Cardiology

## 2023-04-23 DIAGNOSIS — Z136 Encounter for screening for cardiovascular disorders: Secondary | ICD-10-CM | POA: Insufficient documentation

## 2023-04-23 DIAGNOSIS — H903 Sensorineural hearing loss, bilateral: Secondary | ICD-10-CM | POA: Diagnosis not present

## 2023-04-23 DIAGNOSIS — R931 Abnormal findings on diagnostic imaging of heart and coronary circulation: Secondary | ICD-10-CM | POA: Insufficient documentation

## 2023-04-23 DIAGNOSIS — I700 Atherosclerosis of aorta: Secondary | ICD-10-CM | POA: Diagnosis not present

## 2023-04-23 DIAGNOSIS — I2510 Atherosclerotic heart disease of native coronary artery without angina pectoris: Secondary | ICD-10-CM | POA: Insufficient documentation

## 2023-04-23 DIAGNOSIS — I517 Cardiomegaly: Secondary | ICD-10-CM | POA: Insufficient documentation

## 2023-04-23 LAB — NM PET CT CARDIAC PERFUSION MULTI W/ABSOLUTE BLOODFLOW
LV dias vol: 85 mL (ref 62–150)
LV sys vol: 20 mL
MBFR: 3.08
Nuc Rest EF: 59 %
Nuc Stress EF: 76 %
Peak HR: 104 {beats}/min
Rest HR: 58 {beats}/min
Rest MBF: 0.92 ml/g/min
Rest Nuclear Isotope Dose: 18.5 mCi
ST Depression (mm): 0 mm
Stress MBF: 2.83 ml/g/min
Stress Nuclear Isotope Dose: 18.5 mCi
TID: 0.74

## 2023-04-23 MED ORDER — REGADENOSON 0.4 MG/5ML IV SOLN
0.4000 mg | Freq: Once | INTRAVENOUS | Status: AC
  Administered 2023-04-23: 0.4 mg via INTRAVENOUS

## 2023-04-23 MED ORDER — RUBIDIUM RB82 GENERATOR (RUBYFILL)
18.5200 | PACK | Freq: Once | INTRAVENOUS | Status: AC
  Administered 2023-04-23: 18.52 via INTRAVENOUS

## 2023-04-23 MED ORDER — REGADENOSON 0.4 MG/5ML IV SOLN
INTRAVENOUS | Status: AC
  Filled 2023-04-23: qty 5

## 2023-04-23 MED ORDER — RUBIDIUM RB82 GENERATOR (RUBYFILL)
18.4500 | PACK | Freq: Once | INTRAVENOUS | Status: AC
  Administered 2023-04-23: 18.45 via INTRAVENOUS

## 2023-04-25 ENCOUNTER — Telehealth: Payer: Self-pay

## 2023-04-25 NOTE — Telephone Encounter (Signed)
-----   Message from Armanda Magic sent at 04/24/2023 12:14 PM EST ----- No ischemia on stress PET/CT

## 2023-04-25 NOTE — Telephone Encounter (Signed)
Call to patient to review noncardiac portion of stress PET/CT showed mild enlargement of the heart, coronary artery calcifications and calcified plaque in the aorta. Also reviewed stress portion that showed no ischemia. Patient verbalizes understanding.

## 2023-05-07 DIAGNOSIS — H903 Sensorineural hearing loss, bilateral: Secondary | ICD-10-CM | POA: Diagnosis not present

## 2023-05-16 ENCOUNTER — Encounter: Payer: Self-pay | Admitting: Nurse Practitioner

## 2023-05-16 ENCOUNTER — Ambulatory Visit: Payer: PPO | Admitting: Nurse Practitioner

## 2023-05-16 VITALS — BP 128/71 | HR 60 | Temp 98.2°F | Ht 68.0 in | Wt 155.4 lb

## 2023-05-16 DIAGNOSIS — R066 Hiccough: Secondary | ICD-10-CM

## 2023-05-16 DIAGNOSIS — K219 Gastro-esophageal reflux disease without esophagitis: Secondary | ICD-10-CM | POA: Diagnosis not present

## 2023-05-16 MED ORDER — BACLOFEN 10 MG PO TABS
10.0000 mg | ORAL_TABLET | Freq: Three times a day (TID) | ORAL | 0 refills | Status: DC
Start: 2023-05-16 — End: 2023-07-22

## 2023-05-16 MED ORDER — PANTOPRAZOLE SODIUM 40 MG PO TBEC
40.0000 mg | DELAYED_RELEASE_TABLET | Freq: Every day | ORAL | 3 refills | Status: DC
Start: 2023-05-16 — End: 2023-07-22

## 2023-05-16 NOTE — Patient Instructions (Signed)
Hiccups  A hiccup is the result of a sudden irritation of a muscle that is used for breathing (diaphragm). The diaphragm is located under your lungs and above your stomach. When the diaphragm gets irritated, it may quickly tighten without your control (have a spasm). The spasm causes you to quickly suck in air, and that causes your vocal cords to close together quickly. These reactions cause the hiccup sound. Hiccups usually last only a short amount of time (less than 48 hours). In unusual cases, they can last for days or months and require you to see your health care provider. Common causes of hiccups include: Eating too fast or eating too much food. Drinking alcohol or bubbly (carbonated) drinks. Eating or drinking hot or spicy foods and drinks. Swallowing extra air when sucking on candy or a straw or when chewing on gum. Feeling nervous, stressed, or excited. Having certain conditions that irritate the diaphragm nerves. Having metabolic or nervous system disorders. Follow these instructions at home: To prevent hiccups or lessen discomfort from hiccups: Eat and chew your food slowly. Eat small meals, and avoid overeating. If you drink alcohol: Limit how much you have to: 0-1 drink a day for women who are not pregnant. 0-2 drinks a day for men. Know how much alcohol is in a drink. In the [place] one drink equals one 12 oz bottle of beer (355 mL), one 5 oz glass of wine (148 mL), or one 1 oz glass of hard liquor (44 mL). Limit your drinking of carbonated or fizzy drinks, such as soda. Avoid eating or drinking hot or spicy foods and drinks. General instructions Watch for any changes in your hiccups. Take over-the-counter and prescription medicines only as told by your health care provider. Contact a health care provider if: Your hiccups last for more than 48 hours. Your hiccups do not improve with treatment. You cannot sleep or eat because of your hiccups. You have unexpected weight loss  because of your hiccups. You have numbness, tingling, or weakness. Get help right away if: You have trouble breathing or swallowing. You have severe pain in your abdomen. These symptoms may represent a serious problem that is an emergency. Do not wait to see if the symptoms will go away. Get medical help right away. Call your local emergency services (911 in the [place] Do not drive yourself to the hospital. Summary A hiccup is the result of a sudden irritation of a muscle that is used for breathing (diaphragm). Hiccups can be caused by many things, including eating too fast. Call your health care provider if your hiccups last for more than 48 hours. This information is not intended to replace advice given to you by your health care provider. Make sure you discuss any questions you have with your health care provider. Document Revised: 11/13/2019 Document Reviewed: 11/13/2019 Elsevier Patient Education  2024 [employer].

## 2023-05-16 NOTE — Progress Notes (Signed)
   Subjective:    Patient ID: Peter York, male    DOB: [birth year], 78 y.o.   MRN: [id number]   Chief Complaint: Hiccups (Hiccups for 3 days)   HPI  Patient come sin today c/o of hiccups for 3 ays. Says he thiks he over ate which started them. Causing some pain. 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  Eyes:  Negative for pain.  Respiratory:  Negative for shortness of breath.   Cardiovascular:  Negative for chest pain, palpitations and leg swelling.  Gastrointestinal:  Negative for abdominal pain.  Endocrine: Negative for polydipsia.  Skin:  Negative for rash.  Neurological:  Negative for dizziness, weakness and headaches.  Hematological:  Does not bruise/bleed easily.  All other systems reviewed and are negative.      Objective:   Physical Exam Constitutional:      Appearance: Normal appearance.  HENT:     Right Ear: Tympanic membrane normal.     Nose: Nose normal.  Cardiovascular:     Rate and Rhythm: Normal rate and regular rhythm.     Heart sounds: Murmur (3/6) heard.  Pulmonary:     Breath sounds: Normal breath sounds.     Comments: Hiccups throughout visit Skin:    General: Skin is warm.  Neurological:     General: No focal deficit present.     Mental Status: He is alert and oriented to  person, place, and time.  Psychiatric:        Mood and Affect: Mood normal.        Behavior: Behavior normal.    BP 128/71   Pulse 60   Temp 98.2 F (36.8 C) (Temporal)   Ht 5\' 8"  (1.727 m)   Wt 155 lb 6.4 oz (70.5 kg)   SpO2 100%   BMI 23.63 kg/m         Assessment & Plan:  Albertha Ghee in today with chief complaint of Hiccups (Hiccups for 3 days)   1. Hiccups (Primary) - baclofen (LIORESAL) 10 MG tablet; Take 1 tablet (10 mg total) by mouth 3 (three) times daily.  Dispense: 30 each; Refill: 0  2. Gastroesophageal reflux disease without esophagitis Avoid spicy foods Do not eat 2 hours prior to bedtime - pantoprazole (PROTONIX) 40 MG tablet; Take 1 tablet (40 mg total) by mouth daily.  Dispense: 30 tablet; Refill: 3    The above assessment and management plan was discussed with the patient. The patient verbalized understanding of and has agreed to the management plan. Patient is aware to call the clinic if symptoms persist or worsen. Patient is aware when to return to the clinic for a follow-up visit. Patient educated on when it is appropriate to go to the emergency department.   Mary-Margaret Daphine Deutscher, FNP

## 2023-05-20 DIAGNOSIS — R066 Hiccough: Secondary | ICD-10-CM | POA: Diagnosis not present

## 2023-05-21 ENCOUNTER — Other Ambulatory Visit: Payer: PPO

## 2023-05-21 DIAGNOSIS — E782 Mixed hyperlipidemia: Secondary | ICD-10-CM | POA: Diagnosis not present

## 2023-05-21 DIAGNOSIS — Z79899 Other long term (current) drug therapy: Secondary | ICD-10-CM | POA: Diagnosis not present

## 2023-05-21 DIAGNOSIS — E1169 Type 2 diabetes mellitus with other specified complication: Secondary | ICD-10-CM | POA: Diagnosis not present

## 2023-05-21 DIAGNOSIS — E785 Hyperlipidemia, unspecified: Secondary | ICD-10-CM | POA: Diagnosis not present

## 2023-05-22 LAB — ALT: ALT: 15 [IU]/L (ref 0–44)

## 2023-06-18 ENCOUNTER — Other Ambulatory Visit: Payer: Self-pay | Admitting: Nurse Practitioner

## 2023-06-18 DIAGNOSIS — J309 Allergic rhinitis, unspecified: Secondary | ICD-10-CM

## 2023-07-14 ENCOUNTER — Other Ambulatory Visit: Payer: Self-pay | Admitting: Nurse Practitioner

## 2023-07-14 DIAGNOSIS — I10 Essential (primary) hypertension: Secondary | ICD-10-CM

## 2023-07-22 ENCOUNTER — Encounter: Payer: Self-pay | Admitting: Nurse Practitioner

## 2023-07-22 ENCOUNTER — Ambulatory Visit (INDEPENDENT_AMBULATORY_CARE_PROVIDER_SITE_OTHER): Payer: PPO | Admitting: Nurse Practitioner

## 2023-07-22 VITALS — BP 128/65 | HR 58 | Temp 97.5°F | Ht 68.0 in | Wt 145.0 lb

## 2023-07-22 DIAGNOSIS — E119 Type 2 diabetes mellitus without complications: Secondary | ICD-10-CM | POA: Diagnosis not present

## 2023-07-22 DIAGNOSIS — K219 Gastro-esophageal reflux disease without esophagitis: Secondary | ICD-10-CM

## 2023-07-22 DIAGNOSIS — E785 Hyperlipidemia, unspecified: Secondary | ICD-10-CM | POA: Diagnosis not present

## 2023-07-22 DIAGNOSIS — I10 Essential (primary) hypertension: Secondary | ICD-10-CM | POA: Diagnosis not present

## 2023-07-22 DIAGNOSIS — E1169 Type 2 diabetes mellitus with other specified complication: Secondary | ICD-10-CM | POA: Diagnosis not present

## 2023-07-22 DIAGNOSIS — Z0000 Encounter for general adult medical examination without abnormal findings: Secondary | ICD-10-CM

## 2023-07-22 DIAGNOSIS — E782 Mixed hyperlipidemia: Secondary | ICD-10-CM

## 2023-07-22 DIAGNOSIS — Z0001 Encounter for general adult medical examination with abnormal findings: Secondary | ICD-10-CM | POA: Diagnosis not present

## 2023-07-22 LAB — CBC WITH DIFFERENTIAL/PLATELET
Basophils Absolute: 0.1 10*3/uL (ref 0.0–0.2)
Basos: 1 %
EOS (ABSOLUTE): 0.5 10*3/uL — ABNORMAL HIGH (ref 0.0–0.4)
Eos: 7 %
Hematocrit: 37.2 % — ABNORMAL LOW (ref 37.5–51.0)
Hemoglobin: 12.8 g/dL — ABNORMAL LOW (ref 13.0–17.7)
Immature Grans (Abs): 0 10*3/uL (ref 0.0–0.1)
Immature Granulocytes: 0 %
Lymphocytes Absolute: 1 10*3/uL (ref 0.7–3.1)
Lymphs: 15 %
MCH: 32.5 pg (ref 26.6–33.0)
MCHC: 34.4 g/dL (ref 31.5–35.7)
MCV: 94 fL (ref 79–97)
Monocytes Absolute: 0.4 10*3/uL (ref 0.1–0.9)
Monocytes: 6 %
Neutrophils Absolute: 4.9 10*3/uL (ref 1.4–7.0)
Neutrophils: 71 %
Platelets: 264 10*3/uL (ref 150–450)
RBC: 3.94 x10E6/uL — ABNORMAL LOW (ref 4.14–5.80)
RDW: 11.9 % (ref 11.6–15.4)
WBC: 6.9 10*3/uL (ref 3.4–10.8)

## 2023-07-22 LAB — CMP14+EGFR
ALT: 25 IU/L (ref 0–44)
AST: 20 IU/L (ref 0–40)
Albumin: 4.5 g/dL (ref 3.8–4.8)
Alkaline Phosphatase: 68 IU/L (ref 44–121)
BUN/Creatinine Ratio: 17 (ref 10–24)
BUN: 25 mg/dL (ref 8–27)
Bilirubin Total: 0.4 mg/dL (ref 0.0–1.2)
CO2: 21 mmol/L (ref 20–29)
Calcium: 9.7 mg/dL (ref 8.6–10.2)
Chloride: 105 mmol/L (ref 96–106)
Creatinine, Ser: 1.48 mg/dL — ABNORMAL HIGH (ref 0.76–1.27)
Globulin, Total: 2.7 g/dL (ref 1.5–4.5)
Glucose: 107 mg/dL — ABNORMAL HIGH (ref 70–99)
Potassium: 5.5 mmol/L — ABNORMAL HIGH (ref 3.5–5.2)
Sodium: 138 mmol/L (ref 134–144)
Total Protein: 7.2 g/dL (ref 6.0–8.5)
eGFR: 48 mL/min/{1.73_m2} — ABNORMAL LOW (ref >59)

## 2023-07-22 LAB — LIPID PANEL
Chol/HDL Ratio: 3.1 ratio (ref 0.0–5.0)
Cholesterol, Total: 126 mg/dL (ref 100–199)
HDL: 41 mg/dL (ref >39)
LDL Chol Calc (NIH): 70 mg/dL (ref 0–99)
Triglycerides: 72 mg/dL (ref 0–149)
VLDL Cholesterol Cal: 15 mg/dL (ref 5–40)

## 2023-07-22 LAB — BAYER DCA HB A1C WAIVED: HB A1C (BAYER DCA - WAIVED): 6.5 % — ABNORMAL HIGH (ref 4.8–5.6)

## 2023-07-22 MED ORDER — EZETIMIBE 10 MG PO TABS: 10.0000 mg | ORAL_TABLET | Freq: Every day | ORAL | 1 refills | Status: DC

## 2023-07-22 MED ORDER — PANTOPRAZOLE SODIUM 40 MG PO TBEC: 40.0000 mg | DELAYED_RELEASE_TABLET | Freq: Every day | ORAL | 3 refills | Status: DC

## 2023-07-22 MED ORDER — PANTOPRAZOLE SODIUM 40 MG PO TBEC
40.0000 mg | DELAYED_RELEASE_TABLET | Freq: Every day | ORAL | 1 refills | Status: DC
Start: 2023-07-22 — End: 2024-01-21

## 2023-07-22 MED ORDER — LOSARTAN POTASSIUM 100 MG PO TABS: 100.0000 mg | ORAL_TABLET | Freq: Every day | ORAL | 1 refills | Status: DC

## 2023-07-22 MED ORDER — GLIMEPIRIDE 4 MG PO TABS
4.0000 mg | ORAL_TABLET | Freq: Every day | ORAL | 1 refills | Status: DC
Start: 2023-07-22 — End: 2024-01-21

## 2023-07-22 MED ORDER — ATORVASTATIN CALCIUM 10 MG PO TABS
10.0000 mg | ORAL_TABLET | ORAL | 1 refills | Status: DC
Start: 2023-07-22 — End: 2024-01-21

## 2023-07-22 NOTE — Progress Notes (Signed)
 Subjective:    Patient ID: Peter York, male    DOB: 1945-10-12, 78 y.o.   MRN: [id number]   Chief Complaint: annual physical    HPI:  Peter York is a 78 y.o. who identifies as a male who was assigned male at birth.   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 Hyperlipidemia associated with type 2 diabetes mellitus (HCC) Does watch diet and does exercise daily. Lab Results  Component Value Date   CHOL 135 01/18/2023   HDL 45 01/18/2023   LDLCALC 73 01/18/2023   TRIG 90 01/18/2023   CHOLHDL 3.0 01/18/2023     2. Primary hypertension No c/o chest pain, sob or headache. Does not check  blood pressure very often at home. BP Readings from Last 3 Encounters:  05/16/23 128/71  04/23/23 (!) 108/44  04/09/23 138/76     3. Type 2 diabetes mellitus without complication, without long-term current use of insulin  (HCC) Fasting blood sugars are running around 130 on average. No low blood sugars. Lab Results  Component Value Date   HGBA1C 6.3 (H) 01/18/2023     4. History  of cerebrovascular accident (CVA) No permanent effects   New complaints: None today  Allergies  Allergen Reactions   Crestor  [Rosuvastatin  Calcium ] Other (See Comments)    "like to killed me." joint pain, unable to get OOB   Marrian Siva ]     Brain bleed on ASA   Benazepril  Other (See Comments)    hyperkalemia   Outpatient Encounter Medications as of 07/22/2023  Medication Sig   amLODipine  (NORVASC ) 10 MG tablet Take 1 tablet (10 mg total) by mouth daily.   atorvastatin  (LIPITOR) 10 MG tablet Take 1 tablet (10 mg total) by mouth 3 (three) times a week.   baclofen  (LIORESAL ) 10 MG tablet Take 1 tablet (10 mg total) by mouth 3 (three) times daily.   Calcium  Carbonate-Vitamin D  (CALCIUM -D PO) Take 1 tablet by mouth at bedtime.   colchicine  0.6 MG tablet Take 0.6 mg by mouth daily as needed  (gout pain).   ezetimibe  (ZETIA ) 10 MG tablet Take 1 tablet (10 mg total) by mouth daily.   fluticasone  (FLONASE ) 50 MCG/ACT nasal spray SPRAY 2 SPRAYS INTO EACH NOSTRIL EVERY DAY   glimepiride  (AMARYL ) 4 MG tablet Take 1 tablet (4 mg total) by mouth daily with breakfast.   hydrALAZINE  (APRESOLINE ) 25 MG tablet TAKE 1 TABLET BY MOUTH THREE TIMES A DAY   losartan  (COZAAR ) 100 MG tablet TAKE 1 TABLET BY MOUTH EVERY DAY   ONETOUCH ULTRA TEST test strip CHECK BLOOD SUGAR DAILY DX E11.9   pantoprazole  (PROTONIX ) 40 MG tablet Take 1 tablet (40 mg total) by mouth daily.   Specialty Vitamins Products (PROSTATE PO) Take 1 tablet by mouth daily. Super Beta Prostate   vitamin E  400 UNIT capsule Take 1 capsule (400 Units total) by mouth daily.   No facility-administered encounter medications on file as of 07/22/2023.    Past Surgical History:  Procedure Laterality Date   ANAL FISTULECTOMY  10/02/2011   Procedure: FISTULECTOMY ANAL;  Surgeon: Brandy Cal. Cornett, MD;  Location: [facility];  Service: General;  Laterality: N/A;  excision perianal mass    APPENDECTOMY     COLONOSCOPY W/ POLYPECTOMY  12/31/2005   Dr. Loy Ruff   HERNIA REPAIR     Repaired 02/05/2022   INGUINAL  HERNIA REPAIR Right 02/05/2022   Procedure: HERNIA REPAIR INGUINAL ADULT W/ MESH;  Surgeon: Marijo Shove, DO;  Location: [facility];  Service: General;  Laterality: Right;   UMBILICAL HERNIA REPAIR      Family History  Problem Relation Age of Onset   Colon cancer Father 43   Colon polyps Father    Cancer Father 39       Colon   Heart attack Maternal Uncle        X 4   Heart disease Maternal Uncle    Heart disease Maternal Uncle    Heart disease Maternal Uncle    Diabetes Daughter 41       Type 1   Transient ischemic attack Brother 75      Controlled substance contract: n/a     Review of Systems  Constitutional:  Negative for diaphoresis.  Eyes:  Negative for pain.  Respiratory:   Negative for shortness of breath.   Cardiovascular:  Negative for chest pain, palpitations and leg swelling.  Gastrointestinal:  Negative for abdominal pain.  Endocrine: Negative for polydipsia.  Skin:  Negative for rash.  Neurological:  Negative for dizziness, weakness and headaches.  Hematological:  Does not bruise/bleed easily.  All other systems reviewed and are negative.      Objective:   Physical Exam Vitals and nursing note reviewed.  Constitutional:      Appearance: Normal appearance. He is well-developed.  HENT:     Head: Normocephalic.     Nose: Nose normal.     Mouth/Throat:     Mouth: Mucous membranes are moist.     Pharynx: Oropharynx is clear.  Eyes:     Pupils: Pupils are equal, round, and reactive to light.  Neck:     Thyroid : No thyroid  mass or thyromegaly.     Vascular: No carotid bruit or JVD.     Trachea: Phonation normal.  Cardiovascular:     Rate and Rhythm: Normal rate and regular rhythm.  Pulmonary:     Effort: Pulmonary effort is normal. No respiratory distress.     Breath sounds: Normal breath sounds.  Abdominal:     General: Bowel sounds are normal.     Palpations: Abdomen is soft.     Tenderness: There is no abdominal tenderness.  Musculoskeletal:        General: Normal range of motion.     Cervical back: Normal range of motion and neck supple.  Lymphadenopathy:     Cervical: No cervical adenopathy.  Skin:    General: Skin is warm and dry.  Neurological:     Mental Status: He is alert and oriented to person, place, and time.  Psychiatric:        Behavior: Behavior normal.        Thought Content: Thought content normal.        Judgment: Judgment normal.    BP 128/65   Pulse (!) 58   Temp (!) 97.5 F (36.4 C) (Temporal)   Ht 5\' 8"  (1.727 m)   Wt 145 lb (65.8 kg)   SpO2 100%   BMI 22.05 kg/m     HGBA1c 6.5%       Assessment & Plan:   Peter York comes in today with chief complaint of annual physical  Diagnosis and  orders addressed:  1. Hyperlipidemia associated with type 2 diabetes mellitus (HCC) Low fat diet - CBC with Differential/Platelet - CMP14+EGFR - Lipid panel - Microalbumin / creatinine urine ratio - Bayer  DCA Hb A1c Waived - atorvastatin  (LIPITOR) 10 MG tablet; Take 1 tablet (10 mg total) by mouth daily.  Dispense: 90 tablet; Refill: 1 - ezetimibe  (ZETIA ) 10 MG tablet; Take 1 tablet (10 mg total) by mouth daily.  Dispense: 90 tablet; Refill: 1  2. Primary hypertension Low sodium diet - CBC with Differential/Platelet - CMP14+EGFR - Lipid panel - Microalbumin / creatinine urine ratio - Bayer DCA Hb A1c Waived - amLODipine  (NORVASC ) 10 MG tablet; Take 1 tablet (10 mg total) by mouth daily.  Dispense: 90 tablet; Refill: 1 - losartan  (COZAAR ) 100 MG tablet; Take 1 tablet (100 mg total) by mouth daily.  Dispense: 90 tablet; Refill: 1  3. Type 2 diabetes mellitus without complication, without long-term current use of insulin  (HCC) Continue to watch carbs in diet - CBC with Differential/Platelet - CMP14+EGFR - Lipid panel - Microalbumin / creatinine urine ratio - Bayer DCA Hb A1c Waived - glimepiride  (AMARYL ) 4 MG tablet; Take 1 tablet (4 mg total) by mouth daily with breakfast.  Dispense: 90 tablet; Refill: 1  4. Late effect of cerebrovascular accident (CVA)    Labs pending Health Maintenance reviewed Diet and exercise encouraged  Follow up plan: 6 months   Mary-Margaret Gaylyn Keas, FNP

## 2023-08-19 ENCOUNTER — Other Ambulatory Visit: Payer: Self-pay | Admitting: Nurse Practitioner

## 2023-08-19 DIAGNOSIS — I10 Essential (primary) hypertension: Secondary | ICD-10-CM

## 2023-10-10 ENCOUNTER — Other Ambulatory Visit: Payer: Self-pay | Admitting: Nurse Practitioner

## 2023-10-10 DIAGNOSIS — I10 Essential (primary) hypertension: Secondary | ICD-10-CM

## 2023-12-12 DIAGNOSIS — H40033 Anatomical narrow angle, bilateral: Secondary | ICD-10-CM | POA: Diagnosis not present

## 2023-12-12 DIAGNOSIS — E119 Type 2 diabetes mellitus without complications: Secondary | ICD-10-CM | POA: Diagnosis not present

## 2023-12-13 ENCOUNTER — Other Ambulatory Visit: Payer: Self-pay | Admitting: Nurse Practitioner

## 2023-12-13 DIAGNOSIS — J309 Allergic rhinitis, unspecified: Secondary | ICD-10-CM

## 2024-01-16 DIAGNOSIS — R972 Elevated prostate specific antigen [PSA]: Secondary | ICD-10-CM | POA: Diagnosis not present

## 2024-01-21 ENCOUNTER — Ambulatory Visit: Admitting: Nurse Practitioner

## 2024-01-21 ENCOUNTER — Encounter: Payer: Self-pay | Admitting: Nurse Practitioner

## 2024-01-21 VITALS — BP 128/65 | HR 54 | Temp 97.6°F | Ht 68.0 in | Wt 141.0 lb

## 2024-01-21 DIAGNOSIS — E1169 Type 2 diabetes mellitus with other specified complication: Secondary | ICD-10-CM | POA: Diagnosis not present

## 2024-01-21 DIAGNOSIS — K219 Gastro-esophageal reflux disease without esophagitis: Secondary | ICD-10-CM | POA: Diagnosis not present

## 2024-01-21 DIAGNOSIS — E1122 Type 2 diabetes mellitus with diabetic chronic kidney disease: Secondary | ICD-10-CM | POA: Diagnosis not present

## 2024-01-21 DIAGNOSIS — Z7984 Long term (current) use of oral hypoglycemic drugs: Secondary | ICD-10-CM | POA: Diagnosis not present

## 2024-01-21 DIAGNOSIS — Z8673 Personal history of transient ischemic attack (TIA), and cerebral infarction without residual deficits: Secondary | ICD-10-CM | POA: Diagnosis not present

## 2024-01-21 DIAGNOSIS — I10 Essential (primary) hypertension: Secondary | ICD-10-CM

## 2024-01-21 DIAGNOSIS — N183 Chronic kidney disease, stage 3 unspecified: Secondary | ICD-10-CM

## 2024-01-21 DIAGNOSIS — E785 Hyperlipidemia, unspecified: Secondary | ICD-10-CM

## 2024-01-21 LAB — CBC WITH DIFFERENTIAL/PLATELET
Basophils Absolute: 0.1 x10E3/uL (ref 0.0–0.2)
Basos: 2 %
EOS (ABSOLUTE): 0.4 x10E3/uL (ref 0.0–0.4)
Eos: 7 %
Hematocrit: 37.8 % (ref 37.5–51.0)
Hemoglobin: 12.4 g/dL — ABNORMAL LOW (ref 13.0–17.7)
Immature Grans (Abs): 0 x10E3/uL (ref 0.0–0.1)
Immature Granulocytes: 0 %
Lymphocytes Absolute: 1.2 x10E3/uL (ref 0.7–3.1)
Lymphs: 21 %
MCH: 31.4 pg (ref 26.6–33.0)
MCHC: 32.8 g/dL (ref 31.5–35.7)
MCV: 96 fL (ref 79–97)
Monocytes Absolute: 0.4 x10E3/uL (ref 0.1–0.9)
Monocytes: 6 %
Neutrophils Absolute: 3.5 x10E3/uL (ref 1.4–7.0)
Neutrophils: 64 %
Platelets: 288 x10E3/uL (ref 150–450)
RBC: 3.95 x10E6/uL — ABNORMAL LOW (ref 4.14–5.80)
RDW: 12.6 % (ref 11.6–15.4)
WBC: 5.6 x10E3/uL (ref 3.4–10.8)

## 2024-01-21 LAB — CMP14+EGFR
ALT: 19 IU/L (ref 0–44)
AST: 19 IU/L (ref 0–40)
Albumin: 4.5 g/dL (ref 3.8–4.8)
Alkaline Phosphatase: 63 IU/L (ref 47–123)
BUN/Creatinine Ratio: 18 (ref 10–24)
BUN: 30 mg/dL — ABNORMAL HIGH (ref 8–27)
Bilirubin Total: 0.7 mg/dL (ref 0.0–1.2)
CO2: 21 mmol/L (ref 20–29)
Calcium: 9.4 mg/dL (ref 8.6–10.2)
Chloride: 100 mmol/L (ref 96–106)
Creatinine, Ser: 1.71 mg/dL — ABNORMAL HIGH (ref 0.76–1.27)
Globulin, Total: 2.9 g/dL (ref 1.5–4.5)
Glucose: 120 mg/dL — ABNORMAL HIGH (ref 70–99)
Potassium: 4.8 mmol/L (ref 3.5–5.2)
Sodium: 136 mmol/L (ref 134–144)
Total Protein: 7.4 g/dL (ref 6.0–8.5)
eGFR: 40 mL/min/1.73 — ABNORMAL LOW (ref >59)

## 2024-01-21 LAB — LIPID PANEL
Chol/HDL Ratio: 3 ratio (ref 0.0–5.0)
Cholesterol, Total: 143 mg/dL (ref 100–199)
HDL: 47 mg/dL (ref >39)
LDL Chol Calc (NIH): 82 mg/dL (ref 0–99)
Triglycerides: 67 mg/dL (ref 0–149)
VLDL Cholesterol Cal: 14 mg/dL (ref 5–40)

## 2024-01-21 LAB — BAYER DCA HB A1C WAIVED: HB A1C (BAYER DCA - WAIVED): 6.3 % — ABNORMAL HIGH (ref 4.8–5.6)

## 2024-01-21 MED ORDER — EZETIMIBE 10 MG PO TABS
10.0000 mg | ORAL_TABLET | Freq: Every day | ORAL | 1 refills | Status: AC
Start: 2024-01-21 — End: ?

## 2024-01-21 MED ORDER — HYDRALAZINE HCL 25 MG PO TABS: 25.0000 mg | ORAL_TABLET | Freq: Three times a day (TID) | ORAL | 1 refills | Status: AC

## 2024-01-21 MED ORDER — LOSARTAN POTASSIUM 100 MG PO TABS: 100.0000 mg | ORAL_TABLET | Freq: Every day | ORAL | 1 refills | Status: AC

## 2024-01-21 MED ORDER — PANTOPRAZOLE SODIUM 40 MG PO TBEC: 40.0000 mg | DELAYED_RELEASE_TABLET | Freq: Every day | ORAL | 1 refills | Status: AC

## 2024-01-21 MED ORDER — AMLODIPINE BESYLATE 10 MG PO TABS: 10.0000 mg | ORAL_TABLET | Freq: Every day | ORAL | 1 refills | Status: AC

## 2024-01-21 MED ORDER — ATORVASTATIN CALCIUM 10 MG PO TABS: 10.0000 mg | ORAL_TABLET | ORAL | 1 refills | Status: AC

## 2024-01-21 MED ORDER — GLIMEPIRIDE 4 MG PO TABS: 4.0000 mg | ORAL_TABLET | Freq: Every day | ORAL | 1 refills | Status: AC

## 2024-01-21 NOTE — Progress Notes (Signed)
 Subjective:    Patient ID: Peter York, male    DOB: 08-22-1945, 78 y.o.   MRN: [id number]   Chief Complaint: medical management of chronic issues     HPI:  Peter York is a 78 y.o. who identifies as a male who was assigned male at birth.   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 Hyperlipidemia associated with type 2 diabetes mellitus (HCC) Does wathc diet and does exercise daily. Lab Results  Component Value Date   CHOL 126 07/22/2023   HDL 41 07/22/2023   LDLCALC 70 07/22/2023   TRIG 72 07/22/2023   CHOLHDL 3.1 07/22/2023     2. Primary hypertension No c/o chest pain, sob or headache. Doe check  blood pressure very often at home. Running around 110 systolic. He cuts losartan  1/2 cutrently. He says he has  to constantly has to clear his throat and wonders if itis coming fro losartan . BP Readings from Last 3 Encounters:  07/22/23 128/65  05/16/23 128/71  04/23/23 (!) 108/44     3. Type 2 diabetes mellitus without complication, without long-term current use of insulin  (HCC) Fasting blood sugars are running around 130 on average. He ha snot been taking glimiperide cause when he does he will have lows the next day. He has been off all diabetic meds for several months. Lab Results  Component Value Date   HGBA1C 6.5 (H) 07/22/2023     4. History of CVA without residual deficits(CVA) No permanent effects. Denis any new episodes of one sided weakness or slurred speech.  5. CKD stage 2 Last egfr was 48- lowest point was 39   New complaints: None today  Allergies  Allergen Reactions   Crestor  [Rosuvastatin  Calcium ] Other (See Comments)    like to killed me. joint pain, unable to get OOB   Dorethia Cary ]     Brain bleed on ASA   Benazepril  Other (See Comments)    hyperkalemia   Outpatient Encounter Medications as of 01/21/2024  Medication Sig    amLODipine  (NORVASC ) 10 MG tablet TAKE 1 TABLET BY MOUTH EVERY DAY   atorvastatin  (LIPITOR) 10 MG tablet Take 1 tablet (10 mg total) by mouth 3 (three) times a week.   Calcium  Carbonate-Vitamin D  (CALCIUM -D PO) Take 1 tablet by mouth at bedtime.   colchicine  0.6 MG tablet Take 0.6 mg by mouth daily as needed (gout pain).   ezetimibe  (ZETIA ) 10 MG tablet Take 1 tablet (10 mg total) by mouth daily.   fluticasone  (FLONASE ) 50 MCG/ACT nasal spray SPRAY 2 SPRAYS INTO EACH NOSTRIL EVERY DAY   glimepiride  (AMARYL ) 4 MG tablet Take 1 tablet (4 mg total) by mouth daily with breakfast.   hydrALAZINE  (APRESOLINE ) 25 MG tablet TAKE 1 TABLET BY MOUTH THREE TIMES A DAY   losartan  (COZAAR ) 100 MG tablet Take 1 tablet (100 mg total) by mouth daily.   ONETOUCH ULTRA TEST test strip CHECK BLOOD SUGAR DAILY DX E11.9   pantoprazole  (PROTONIX ) 40 MG tablet Take 1 tablet (40 mg total) by mouth daily.   Specialty Vitamins Products (PROSTATE PO) Take 1 tablet by mouth daily. Super Beta Prostate   vitamin E  400 UNIT capsule Take 1 capsule (400 Units total) by mouth daily.   No facility-administered encounter medications on file as of 01/21/2024.    Past Surgical History:  Procedure Laterality Date   ANAL FISTULECTOMY  10/02/2011   Procedure: FISTULECTOMY ANAL;  Surgeon: Debby LABOR. Cornett, MD;  Location: [facility];  Service: General;  Laterality: N/A;  excision perianal mass    APPENDECTOMY     COLONOSCOPY W/ POLYPECTOMY  12/31/2005   Dr. Lupita Commander   HERNIA REPAIR     Repaired 02/05/2022   INGUINAL HERNIA REPAIR Right 02/05/2022   Procedure: HERNIA REPAIR INGUINAL ADULT W/ MESH;  Surgeon: Evonnie Dorothyann LABOR, DO;  Location: [facility];  Service: General;  Laterality: Right;   UMBILICAL HERNIA REPAIR      Family History  Problem Relation Age of Onset   Colon cancer Father 61   Colon polyps Father    Cancer Father 61       Colon   Heart attack Maternal Uncle        X 4   Heart disease  Maternal Uncle    Heart disease Maternal Uncle    Heart disease Maternal Uncle    Diabetes Daughter 12       Type 1   Transient ischemic attack Brother 75      Controlled substance contract: n/a     Review of Systems  Constitutional:  Negative for diaphoresis.  Eyes:  Negative for pain.  Respiratory:  Negative for shortness of breath.   Cardiovascular:  Negative for chest pain, palpitations and leg swelling.  Gastrointestinal:  Negative for abdominal pain.  Endocrine: Negative for polydipsia.  Skin:  Negative for rash.  Neurological:  Negative for dizziness, weakness and headaches.  Hematological:  Does not bruise/bleed easily.  All other systems reviewed and are negative.      Objective:   Physical Exam Vitals and nursing note reviewed.  Constitutional:      Appearance: Normal appearance. He is well-developed.  HENT:     Head: Normocephalic.     Nose: Nose normal.     Mouth/Throat:     Mouth: Mucous membranes are moist.     Pharynx: Oropharynx is clear.  Eyes:     Pupils: Pupils are equal, round, and reactive to light.  Neck:     Thyroid : No thyroid  mass or thyromegaly.     Vascular: No carotid bruit or JVD.     Trachea: Phonation normal.  Cardiovascular:     Rate and Rhythm: Normal rate and regular rhythm.  Pulmonary:     Effort: Pulmonary effort is normal. No respiratory distress.     Breath sounds: Normal breath sounds.  Abdominal:     General: Bowel sounds are normal.     Palpations: Abdomen is soft.     Tenderness: There is no abdominal tenderness.  Musculoskeletal:        General: Normal range of motion.     Cervical back: Normal range of motion and neck supple.  Lymphadenopathy:     Cervical: No cervical adenopathy.  Skin:    General: Skin is warm and dry.  Neurological:     Mental Status: He is alert and oriented to person, place, and time.  Psychiatric:        Behavior: Behavior normal.        Thought Content: Thought content normal.         Judgment: Judgment normal.    BP 128/65   Pulse (!) 54   Temp 97.6 F (36.4 C) (Temporal)   Ht 5' 8 (1.727 m)   Wt 141 lb (64 kg)   SpO2 99%   BMI 21.44 kg/m    HGBA1c 6.3%  Assessment & Plan:  Peter York comes in today with chief complaint of Medical Management of Chronic Issues   Diagnosis and orders addressed:  1. Hyperlipidemia associated with type 2 diabetes mellitus (HCC) (Primary) Low fat diet - Lipid panel - ezetimibe  (ZETIA ) 10 MG tablet; Take 1 tablet (10 mg total) by mouth daily.  Dispense: 90 tablet; Refill: 1 - atorvastatin  (LIPITOR) 10 MG tablet; Take 1 tablet (10 mg total) by mouth 3 (three) times a week.  Dispense: 90 tablet; Refill: 1  2. Primary hypertension Dash diet Patient is going to hold losartan  and see if cough clears. Keep diary of blood pressure at home. If cough resloves  and blood pressure stays up- will have to change meds. - CBC with Differential/Platelet - CMP14+EGFR - amLODipine  (NORVASC ) 10 MG tablet; Take 1 tablet (10 mg total) by mouth daily.  Dispense: 90 tablet; Refill: 1 - hydrALAZINE  (APRESOLINE ) 25 MG tablet; Take 1 tablet (25 mg total) by mouth 3 (three) times daily.  Dispense: 270 tablet; Refill: 1 - losartan  (COZAAR ) 100 MG tablet; Take 1 tablet (100 mg total) by mouth daily.  Dispense: 90 tablet; Refill: 1  3. History of CVA (cerebrovascular accident) without residual deficits   4. Type 2 diabetes mellitus with other specified complication, without long-term current use of insulin  (HCC) Continue to watch carbs in diet - Bayer DCA Hb A1c Waived - Microalbumin / creatinine urine ratio - glimepiride  (AMARYL ) 4 MG tablet; Take 1 tablet (4 mg total) by mouth daily with breakfast.  Dispense: 90 tablet; Refill: 1  5. CKD stage 3 due to type 2 diabetes mellitus (HCC) Labs pending  6. Gastroesophageal reflux disease without esophagitis Avoid spicy foods Do not eat 2 hours prior to bedtime - pantoprazole  (PROTONIX )  40 MG tablet; Take 1 tablet (40 mg total) by mouth daily.  Dispense: 90 tablet; Refill: 1    Labs pending Health Maintenance reviewed Diet and exercise encouraged  Follow up plan: 6 months   Mary-Margaret Gladis, FNP

## 2024-01-21 NOTE — Patient Instructions (Signed)

## 2024-01-22 LAB — MICROALBUMIN / CREATININE URINE RATIO
Creatinine, Urine: 64.6 mg/dL
Microalb/Creat Ratio: 60 mg/g{creat} — ABNORMAL HIGH (ref 0–29)
Microalbumin, Urine: 38.7 ug/mL

## 2024-01-23 ENCOUNTER — Ambulatory Visit: Payer: Self-pay | Admitting: Nurse Practitioner

## 2024-02-03 ENCOUNTER — Other Ambulatory Visit: Payer: Self-pay | Admitting: Nurse Practitioner

## 2024-03-04 ENCOUNTER — Encounter: Payer: Self-pay | Admitting: Cardiology

## 2024-03-08 ENCOUNTER — Encounter (HOSPITAL_COMMUNITY): Payer: Self-pay

## 2024-03-08 ENCOUNTER — Emergency Department (HOSPITAL_COMMUNITY)

## 2024-03-08 ENCOUNTER — Emergency Department (HOSPITAL_COMMUNITY)
Admission: EM | Admit: 2024-03-08 | Discharge: 2024-03-08 | Disposition: A | Discharge status: Home / Self Care | Attending: Emergency Medicine | Admitting: Emergency Medicine

## 2024-03-08 DIAGNOSIS — D72829 Elevated white blood cell count, unspecified: Secondary | ICD-10-CM | POA: Diagnosis not present

## 2024-03-08 DIAGNOSIS — R110 Nausea: Secondary | ICD-10-CM | POA: Insufficient documentation

## 2024-03-08 DIAGNOSIS — K219 Gastro-esophageal reflux disease without esophagitis: Secondary | ICD-10-CM | POA: Insufficient documentation

## 2024-03-08 DIAGNOSIS — R079 Chest pain, unspecified: Secondary | ICD-10-CM | POA: Insufficient documentation

## 2024-03-08 DIAGNOSIS — N2889 Other specified disorders of kidney and ureter: Secondary | ICD-10-CM

## 2024-03-08 DIAGNOSIS — R1084 Generalized abdominal pain: Secondary | ICD-10-CM

## 2024-03-08 DIAGNOSIS — R1013 Epigastric pain: Secondary | ICD-10-CM | POA: Insufficient documentation

## 2024-03-08 LAB — CBC
HCT: 37.1 % — ABNORMAL LOW (ref 39.0–52.0)
Hemoglobin: 12.5 g/dL — ABNORMAL LOW (ref 13.0–17.0)
MCH: 32.2 pg (ref 26.0–34.0)
MCHC: 33.7 g/dL (ref 30.0–36.0)
MCV: 95.6 fL (ref 80.0–100.0)
Platelets: 243 K/uL (ref 150–400)
RBC: 3.88 MIL/uL — ABNORMAL LOW (ref 4.22–5.81)
RDW: 13.3 % (ref 11.5–15.5)
WBC: 16.6 K/uL — ABNORMAL HIGH (ref 4.0–10.5)
nRBC: 0 % (ref 0.0–0.2)

## 2024-03-08 LAB — COMPREHENSIVE METABOLIC PANEL WITH GFR
ALT: 21 U/L (ref 0–44)
AST: 30 U/L (ref 15–41)
Albumin: 4.6 g/dL (ref 3.5–5.0)
Alkaline Phosphatase: 63 U/L (ref 38–126)
Anion gap: 15 (ref 5–15)
BUN: 33 mg/dL — ABNORMAL HIGH (ref 8–23)
CO2: 19 mmol/L — ABNORMAL LOW (ref 22–32)
Calcium: 9.1 mg/dL (ref 8.9–10.3)
Chloride: 101 mmol/L (ref 98–111)
Creatinine, Ser: 1.93 mg/dL — ABNORMAL HIGH (ref 0.61–1.24)
GFR, Estimated: 35 mL/min — ABNORMAL LOW (ref >60)
Glucose, Bld: 167 mg/dL — ABNORMAL HIGH (ref 70–99)
Potassium: 4.6 mmol/L (ref 3.5–5.1)
Sodium: 136 mmol/L (ref 135–145)
Total Bilirubin: 0.5 mg/dL (ref 0.0–1.2)
Total Protein: 7.8 g/dL (ref 6.5–8.1)

## 2024-03-08 LAB — URINALYSIS, ROUTINE W REFLEX MICROSCOPIC
Bacteria, UA: NONE SEEN
Bilirubin Urine: NEGATIVE
Glucose, UA: NEGATIVE mg/dL
Hgb urine dipstick: NEGATIVE
Ketones, ur: NEGATIVE mg/dL
Leukocytes,Ua: NEGATIVE
Nitrite: NEGATIVE
Protein, ur: 30 mg/dL — AB
Specific Gravity, Urine: 1.019 (ref 1.005–1.030)
pH: 5 (ref 5.0–8.0)

## 2024-03-08 LAB — TROPONIN T, HIGH SENSITIVITY
Troponin T High Sensitivity: 17 ng/L (ref 0–19)
Troponin T High Sensitivity: 16 ng/L (ref 0–19)

## 2024-03-08 LAB — LIPASE, BLOOD: Lipase: 35 U/L (ref 11–51)

## 2024-03-08 MED ORDER — IOHEXOL 300 MG/ML  SOLN
80.0000 mL | Freq: Once | INTRAMUSCULAR | Status: AC | PRN
  Administered 2024-03-08: 80 mL via INTRAVENOUS

## 2024-03-08 NOTE — Discharge Instructions (Addendum)
 Please continue to adhere to a clear liquid diet over the next few days as discussed at the bedside.  Follow-up closely with your primary care doctor on an outpatient basis for reevaluation.  Return to emergency department immediately for any new or worsening symptoms as discussed at the bedside to include vomiting, not passing gas or stool, worsening abdominal pain.  Please ensure that you follow-up closely with your primary care doctor regarding the kidney nodule that was noted on CT scan for MRI.  You were provided with a copy of your CT scan report today.

## 2024-03-08 NOTE — ED Provider Notes (Signed)
 [place] EMERGENCY DEPARTMENT AT [facility] Provider Note   CSN: [id number] Arrival date & time: 03/08/24  1453     Patient presents with: Abdominal Pain   Peter York is a 78 y.o. male.   Patient is a 78 year old male who presents to the Emergency Department with a chief complaint of epigastric abdominal pain and chest pain which began early this morning and has been intermittent in nature.  He notes that he has been diagnosed with GERD in the past and has been placed on medications for this but notes that he has had no associated burning.  Patient denies any constipation, vomiting but had had some associated nausea.  He notes that the pain did radiate into his back.  He denies any recent falls or blunt trauma.  He has had no dysuria or hematuria.   Abdominal Pain      Prior to Admission medications  Medication Sig Start Date End Date Taking? Authorizing Provider  amLODipine  (NORVASC ) 10 MG tablet Take 1 tablet (10 mg total) by mouth daily. 01/21/24   Gladis, Mary-Margaret, FNP  atorvastatin  (LIPITOR) 10 MG tablet Take 1 tablet (10 mg total) by mouth 3 (three) times a week. 01/22/24 04/21/24  Gladis Mustard, FNP  Calcium  Carbonate-Vitamin D  (CALCIUM -D PO) Take 1 tablet by mouth at bedtime.    [provider]  colchicine  0.6 MG tablet Take 0.6 mg by mouth daily as needed (gout pain).    [provider]  ezetimibe  (ZETIA ) 10 MG tablet Take 1 tablet (10 mg total) by mouth daily. 01/21/24   Gladis Mustard, FNP  fluticasone  (FLONASE ) 50 MCG/ACT nasal spray SPRAY 2 SPRAYS INTO EACH NOSTRIL EVERY DAY 12/13/23   Gladis Mustard, FNP  glimepiride  (AMARYL ) 4 MG tablet Take 1 tablet (4 mg total) by mouth daily with breakfast. 01/21/24   Gladis Mustard, FNP  hydrALAZINE  (APRESOLINE ) 25 MG tablet Take 1 tablet (25 mg total) by mouth 3 (three) times daily. 01/21/24   Gladis, Mary-Margaret, FNP  losartan  (COZAAR ) 100 MG tablet Take 1 tablet  (100 mg total) by mouth daily. 01/21/24   Gladis Mustard, FNP  [facility] ULTRA test strip CHECK BLOOD SUGAR DAILY DX E11.9 02/04/24   Gladis Mustard, FNP  pantoprazole  (PROTONIX ) 40 MG tablet Take 1 tablet (40 mg total) by mouth daily. 01/21/24   Gladis Mustard, FNP  Specialty Vitamins Products (PROSTATE PO) Take 1 tablet by mouth daily. Super Beta Prostate    [provider]  vitamin E  400 UNIT capsule Take 1 capsule (400 Units total) by mouth daily. 07/09/18   Angiulli, Toribio PARAS, PA-C    Allergies: Crestor  [rosuvastatin  calcium ], Asa [aspirin ], and Benazepril     Review of Systems  Gastrointestinal:  Positive for abdominal pain.  All other systems reviewed and are negative.   Updated Vital Signs BP 124/66 (BP Location: Right Arm)   Pulse (!) 52   Temp 97.8 F (36.6 C) (Oral)   Resp 18   Ht 5' 8 (1.727 m)   Wt 63.5 kg   SpO2 99%   BMI 21.29 kg/m   Physical Exam Vitals and nursing note reviewed.  Constitutional:      General: He is not in acute distress.    Appearance: Normal appearance. He is not ill-appearing.  HENT:     Head: Normocephalic and atraumatic.     Nose: Nose normal.     Mouth/Throat:     Mouth: Mucous membranes are moist.  Eyes:     Extraocular Movements: Extraocular  movements intact.     Conjunctiva/sclera: Conjunctivae normal.     Pupils: Pupils are equal, round, and reactive to light.  Cardiovascular:     Rate and Rhythm: Normal rate and regular rhythm.     Pulses: Normal pulses.     Heart sounds: Normal heart sounds. No murmur heard.    No gallop.  Pulmonary:     Effort: Pulmonary effort is normal. No respiratory distress.     Breath sounds: Normal breath sounds. No stridor. No wheezing, rhonchi or rales.  Abdominal:     General: Abdomen is flat. Bowel sounds are normal. There is no distension.     Palpations: Abdomen is soft.     Tenderness: There is no abdominal tenderness.     Hernia: No hernia is present.   Musculoskeletal:        General: Normal range of motion.     Cervical back: Normal range of motion and neck supple.  Skin:    General: Skin is warm and dry.  Neurological:     General: No focal deficit present.     Mental Status: He is alert and oriented to person, place, and time. Mental status is at baseline.     Cranial Nerves: No cranial nerve deficit.     Motor: No weakness.  Psychiatric:        Mood and Affect: Mood normal.        Behavior: Behavior normal.        Thought Content: Thought content normal.        Judgment: Judgment normal.     (all labs ordered are listed, but only abnormal results are displayed) Labs Reviewed  LIPASE, BLOOD  COMPREHENSIVE METABOLIC PANEL WITH GFR  CBC  URINALYSIS, ROUTINE W REFLEX MICROSCOPIC  TROPONIN T, HIGH SENSITIVITY    EKG: None  Radiology: No results found.   Procedures   Medications Ordered in the ED - No data to display                                  Medical Decision Making Amount and/or Complexity of Data Reviewed Labs: ordered. Radiology: ordered.  Risk Prescription drug management.   This patient presents to the ED for concern of abdominal pain, chest pain differential diagnosis includes acute appendicitis, cholecystitis, as well obstruction, diverticulitis, pyelonephritis, kidney stone, pancreatitis, mesenteric ischemia, ACS, pulmonary embolus, pericarditis, myocarditis, endocarditis, aortic aneurysm or dissection, pneumonia, pneumothorax, hemothorax    Additional history obtained:  Additional history obtained from records External records from outside source obtained and reviewed including medical records   Lab Tests:  I Ordered, and personally interpreted labs.  The pertinent results include: Leukocytosis, anemia at baseline, creatinine at baseline, normal electrolytes, normal liver function, unremarkable urinalysis, negative serial troponins   Imaging Studies ordered:  I ordered imaging  studies including CT scan abdomen and pelvis I independently visualized and interpreted imaging which showed possible partial or early obstruction within the small bowel, renal nodule I agree with the radiologist interpretation    Problem List / ED Course:  Patient is doing well at this time and is stable for discharge home.  CT scan findings are concerning for possible developing versus partial small bowel obstruction.  He still has active bowel sounds throughout and is still passing gas and stool without difficulty.  He has no nausea or vomiting and is tolerating p.o. intake without difficulty.  Do not suspect full obstruction at  this point.  Did discuss this with the patient as well as admission to the hospital versus discharge at this point.  Patient notes that he does wish to be discharged home and will adhere to a clear liquid diet and will return to the emergency department for any new or worsening symptoms.  Symptoms of small bowel obstruction were discussed to look for at home strict return precautions were provided and patient notes that he will return immediately for any signs of this.  No other acute surgical process was noted.  He was made aware of the kidney nodule and the need for follow-up with his PCP for MRI.  They were provided with a copy of the CT scan report as well.  Patient and wife voiced understanding to the plan at this time and had no additional questions.  Patient was fully evaluated by attending physician who is in agreement to plan at this time.   Social Determinants of Health:  None        Final diagnoses:  None    ED Discharge Orders     None          Daralene Lonni JONETTA DEVONNA 03/08/24 1803    Cleotilde Rogue, MD 03/08/24 2215

## 2024-03-08 NOTE — ED Notes (Signed)
 Patient transported to CT

## 2024-03-08 NOTE — ED Triage Notes (Signed)
 Pt c/o intermittent epigastric pain radiating into mid back starting this morning.  Currently, denies pain.  Pt reports movement relieves the pain a little.  Pt was diagnosed w/ GERD in Oct but reports he was diagnosed w/ it because he couldn't clear this throat.

## 2024-04-14 ENCOUNTER — Ambulatory Visit

## 2024-04-14 VITALS — BP 128/70 | Ht 68.0 in | Wt 148.6 lb

## 2024-04-14 DIAGNOSIS — Z0000 Encounter for general adult medical examination without abnormal findings: Secondary | ICD-10-CM | POA: Diagnosis not present

## 2024-04-14 NOTE — Progress Notes (Signed)
 "  Chief Complaint  Patient presents with   Medicare Wellness     Subjective:   Peter York is a 79 y.o. male who presents for a Medicare Annual Wellness Visit.  Visit info / Clinical Intake: Living arrangements:: (Patient-Rptd) lives with spouse/significant other Patient's Overall Health Status Rating: (Patient-Rptd) very good Typical amount of pain: (Patient-Rptd) none Does pain affect daily life?: (Patient-Rptd) no  Dietary Habits and Nutritional Risks How many meals a day?: (Patient-Rptd) 3 Eats fruit and vegetables daily?: (Patient-Rptd) yes Most meals are obtained by: (Patient-Rptd) preparing own meals  Functional Status Activities of Daily Living (to include ambulation/medication): (Patient-Rptd) Independent Ambulation: (Patient-Rptd) Independent Medication Administration: (Patient-Rptd) Independent Home Management (perform basic housework or laundry): (Patient-Rptd) Independent Manage your own finances?: (Patient-Rptd) yes Primary transportation is: (Patient-Rptd) driving  Fall Screening Falls in the past year?: (Patient-Rptd) 0 Patient Fall Risk Level: High fall risk  Home and Transportation Safety: All rugs have non-skid backing?: (!) (Patient-Rptd) no All stairs or steps have railings?: (Patient-Rptd) yes Grab bars in the bathtub or shower?: (Patient-Rptd) yes Have non-skid surface in bathtub or shower?: (Patient-Rptd) yes Good home lighting?: (Patient-Rptd) yes Regular seat belt use?: (Patient-Rptd) yes Hospital stays in the last year:: (Patient-Rptd) no  Cognitive Assessment Difficulty concentrating, remembering, or making decisions? : (Patient-Rptd) no  Advance Directives (For Healthcare) Does Patient Have a Medical Advance Directive?: Yes Does patient want to make changes to medical advance directive?: No - Patient declined Type of Advance Directive: Living will; Healthcare Power of Attorney Copy of Healthcare Power of Attorney in Chart?: No - copy  requested Copy of Living Will in Chart?: No - copy requested    Allergies (verified) Crestor  [rosuvastatin  calcium ], Asa [aspirin ], and Benazepril    Current Medications (verified) Outpatient Encounter Medications as of 04/14/2024  Medication Sig   amLODipine  (NORVASC ) 10 MG tablet Take 1 tablet (10 mg total) by mouth daily.   atorvastatin  (LIPITOR) 10 MG tablet Take 1 tablet (10 mg total) by mouth 3 (three) times a week.   Calcium  Carbonate-Vitamin D  (CALCIUM -D PO) Take 1 tablet by mouth at bedtime.   colchicine  0.6 MG tablet Take 0.6 mg by mouth daily as needed (gout pain).   ezetimibe  (ZETIA ) 10 MG tablet Take 1 tablet (10 mg total) by mouth daily.   fluticasone  (FLONASE ) 50 MCG/ACT nasal spray SPRAY 2 SPRAYS INTO EACH NOSTRIL EVERY DAY   glimepiride  (AMARYL ) 4 MG tablet Take 1 tablet (4 mg total) by mouth daily with breakfast.   hydrALAZINE  (APRESOLINE ) 25 MG tablet Take 1 tablet (25 mg total) by mouth 3 (three) times daily.   losartan  (COZAAR ) 100 MG tablet Take 1 tablet (100 mg total) by mouth daily.   ONETOUCH ULTRA test strip CHECK BLOOD SUGAR DAILY DX E11.9   pantoprazole  (PROTONIX ) 40 MG tablet Take 1 tablet (40 mg total) by mouth daily.   Specialty Vitamins Products (PROSTATE PO) Take 1 tablet by mouth daily. Super Beta Prostate   vitamin E  400 UNIT capsule Take 1 capsule (400 Units total) by mouth daily.   No facility-administered encounter medications on file as of 04/14/2024.    History: Past Medical History:  Diagnosis Date   Adenomatous colon polyp 12/31/2005   Agatston coronary artery calcium  score greater than 400    cor cal score 3673 on 03/2023   Aortic atherosclerosis    COVID-19 02/2020   Diabetes mellitus    Gastroesophageal reflux disease without esophagitis 01/21/2024   Hypercholesterolemia    Hypertension    Intracranial  hemorrhage (HCC) 2020   secondary to hypertensive crisis   Osteoporosis of vertebra 04/11/2016   Renal artery stenosis    Left renal  artery 1-59% by dopplers 10/2018   Stroke [facility])    Past Surgical History:  Procedure Laterality Date   ANAL FISTULECTOMY  10/02/2011   Procedure: FISTULECTOMY ANAL;  Surgeon: Debby LABOR. Cornett, MD;  Location: [facility];  Service: General;  Laterality: N/A;  excision perianal mass    APPENDECTOMY     COLONOSCOPY W/ POLYPECTOMY  12/31/2005   Dr. Lupita Commander   HERNIA REPAIR     Repaired 02/05/2022   INGUINAL HERNIA REPAIR Right 02/05/2022   Procedure: HERNIA REPAIR INGUINAL ADULT W/ MESH;  Surgeon: Evonnie Dorothyann LABOR, DO;  Location: [facility];  Service: General;  Laterality: Right;   UMBILICAL HERNIA REPAIR     Family History  Problem Relation Age of Onset   Colon cancer Father 45   Colon polyps Father    Cancer Father 75       Colon   Heart attack Maternal Uncle        X 4   Heart disease Maternal Uncle    Heart disease Maternal Uncle    Heart disease Maternal Uncle    Diabetes Daughter 52       Type 1   Transient ischemic attack Brother 58   Social History   Occupational History   Occupation: retired  Tobacco Use   Smoking status: Former    Current packs/day: 0.00    Average packs/day: 0.8 packs/day for 18.9 years (15.1 ttl pk-yrs)    Types: Cigarettes    Start date: 03/27/1963    Quit date: 03/26/1978    Years since quitting: 46.0   Smokeless tobacco: Never  Vaping Use   Vaping status: Never Used  Substance and Sexual Activity   Alcohol use: Yes    Comment: cocktail once a month   Drug use: Never   Sexual activity: Yes   Tobacco Counseling Counseling given: Not Answered  SDOH Screenings   Food Insecurity: No Food Insecurity (01/19/2024)  Housing: Low Risk (01/19/2024)  Transportation Needs: No Transportation Needs (01/19/2024)  Utilities: Not At Risk (03/18/2023)  Alcohol Screen: Low Risk (01/19/2024)  Depression (PHQ2-9): Low Risk (01/21/2024)  Financial Resource Strain: Low Risk (01/19/2024)  Physical Activity: Sufficiently Active  (01/19/2024)  Social Connections: Socially Integrated (01/19/2024)  Stress: No Stress Concern Present (01/19/2024)  Tobacco Use: Medium Risk (03/08/2024)  Health Literacy: Adequate Health Literacy (03/18/2023)   See flowsheets for full screening details  Depression Screen PHQ 2 & 9 Depression Scale- Over the past 2 weeks, how often have you been bothered by any of the following problems? Little interest or pleasure in doing things: 0 Feeling down, depressed, or hopeless (PHQ Adolescent also includes...irritable): 0 PHQ-2 Total Score: 0     Goals Addressed             This Visit's Progress    Eat more fruits and vegetables   On track    Maintain health and independence   On track            Objective:    Today's Vitals   04/14/24 0859  Weight: 148 lb 9.6 oz (67.4 kg)  Height: 5' 8 (1.727 m)   Body mass index is 22.59 kg/m.  Hearing/Vision screen No results found. Immunizations and Health Maintenance Health Maintenance  Topic Date Due   COVID-19 Vaccine (1) Never done   OPHTHALMOLOGY EXAM  09/04/2023   Zoster Vaccines- Shingrix (1 of 2) 04/22/2024 (Originally 05/20/1964)   Influenza Vaccine  06/23/2024 (Originally 10/25/2023)   HEMOGLOBIN A1C  04/22/2024   Diabetic kidney evaluation - Urine ACR  07/21/2024   FOOT EXAM  01/20/2025   DTaP/Tdap/Td (2 - Tdap) 01/28/2025   Diabetic kidney evaluation - eGFR measurement  03/08/2025   Medicare Annual Wellness (AWV)  04/14/2025   Pneumococcal Vaccine: 50+ Years  Completed   Hepatitis C Screening  Completed   Meningococcal B Vaccine  Aged Out   Colonoscopy  Discontinued        Assessment/Plan:  This is a routine wellness examination for Peter York.  Patient Care Team: Gladis Mustard, FNP as PCP - General (Family Medicine) Shlomo Wilbert SAUNDERS, MD as PCP - Cardiology (Cardiology) Avram Lupita BRAVO, MD as Consulting Physician (Gastroenterology) Ladora Ross Lacy Phebe, MD as Referring Physician (Optometry) Camillo Golas, MD  as Consulting Physician (Ophthalmology) Gillie Duncans, MD as Consulting Physician (Neurosurgery)  I have personally reviewed and noted the following in the patients chart:   Medical and social history Use of alcohol, tobacco or illicit drugs  Current medications and supplements including opioid prescriptions. Functional ability and status Nutritional status Physical activity Advanced directives List of other physicians Hospitalizations, surgeries, and ER visits in previous 12 months Vitals Screenings to include cognitive, depression, and falls Referrals and appointments  No orders of the defined types were placed in this encounter.  In addition, I have reviewed and discussed with patient certain preventive protocols, quality metrics, and best practice recommendations. A written personalized care plan for preventive services as well as general preventive health recommendations were provided to patient.   Lavelle Charmaine Browner, LPN   [id number]/79/7973   Return in 1 year (on 04/14/2025).  After Visit Summary: (In Person-Printed) AVS printed and given to the patient  Nurse Notes: HM Addressed: Diabetic eye exam notes requested   I have reviewed and agree with the above AWV documentation.   Mary-Margaret Gladis, FNP  "

## 2024-04-14 NOTE — Patient Instructions (Addendum)
 Peter York,  Thank you for taking the time for your Medicare Wellness Visit. I appreciate your continued commitment to your health goals. Please review the care plan we discussed, and feel free to reach out if I can assist you further.  Please note that Annual Wellness Visits do not include a physical exam. Some assessments may be limited, especially if the visit was conducted virtually. If needed, we may recommend an in-person follow-up with your provider.  Ongoing Care Seeing your primary care provider every 3 to 6 months helps us  monitor your health and provide consistent, personalized care.   Referrals If a referral was made during today's visit and you haven't received any updates within two weeks, please contact the referred provider directly to check on the status.  Recommended Screenings:  Health Maintenance  Topic Date Due   COVID-19 Vaccine (1) Never done   Eye exam for diabetics  09/04/2023   Zoster (Shingles) Vaccine (1 of 2) 04/22/2024*   Flu Shot  06/23/2024*   Hemoglobin A1C  04/22/2024   Kidney health urinalysis for diabetes  07/21/2024   Complete foot exam   01/20/2025   DTaP/Tdap/Td vaccine (2 - Tdap) 01/28/2025   Yearly kidney function blood test for diabetes  03/08/2025   Medicare Annual Wellness Visit  04/14/2025   Pneumococcal Vaccine for age over 62  Completed   Hepatitis C Screening  Completed   Meningitis B Vaccine  Aged Out   Colon Cancer Screening  Discontinued  *Topic was postponed. The date shown is not the original due date.       03/08/2024    3:09 PM  Advanced Directives  Does Patient Have a Medical Advance Directive? Yes  Type of Advance Directive Living will;Healthcare Power of Attorney  Does patient want to make changes to medical advance directive? No - Patient declined  Copy of Healthcare Power of Attorney in Chart? No - copy requested   Information on Advanced Care Planning can be found at [place]  [employer] of [facility] Advance Health  Care Directives Advance Health Care Directives ([email or web address]   Vision: Annual vision screenings are recommended for early detection of glaucoma, cataracts, and diabetic retinopathy. These exams can also reveal signs of chronic conditions such as diabetes and high blood pressure.  Dental: Annual dental screenings help detect early signs of oral cancer, gum disease, and other conditions linked to overall health, including heart disease and diabetes.  Please see the attached documents for additional preventive care recommendations.

## 2024-04-26 NOTE — Progress Notes (Unsigned)
 " Cardiology Office Note:    Date:  04/28/2024   ID:  Peter York, DOB 12-21-1945, MRN [id number]  PCP:  Gladis Mustard, FNP   [facility] Providers Cardiologist:  Wilbert Bihari, MD     Referring MD: Gladis Mustard, FNP   Chief complaint: Annual follow-up of chronic cardiovascular conditions     History of Present Illness:   Peter York is a 79 y.o. male with a hx of HTN, DM, hemorrhagic CVA, and dyslipidemia.    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 His ASA was stopped and he has essentially regained all his deficits after rehab.  He has been followed by Dr. Bihari with our cardiology services since 2017.  Most recently in January 2025, he was without cardiovascular complaints at that time.  CT calcium  scoring performed due to calcifications observed on chest CT from [facility] for further risk stratification.  CAC score on 04/15/2023: 3673, 94th percentile, aortic atherosclerosis.  PET scan performed due to elevated calcium  score with left main involvement, 04/23/2023: Normal, low risk, no evidence of ischemia or infarction, resting EF 59%, no TID.   Presents independently, doing well from a cardiovascular standpoint. He denies chest pain, palpitations, dyspnea, orthopnea, n, v,  dark/tarry/bloody stools, hematuria, dizziness, syncope, edema, weight gain.  States he does have a history of whitecoat hypertension.  Reports his BPs typically average <130 systolic at home, he is very cognizant regarding his blood pressure due to previous history of ICH.  He retired from [employer] station working for [employer] when he was 56.  In his retirement he enjoys playing golf, raising and training bird dogs, and tending to his horses.  Recently returned from a trip to [place]  where he was out horseback riding for 7 hours of the day for an entire week.  No cardiovascular complaints.  He reports he has had some difficulties with needing to swallow frequently due to  postnasal drip and the sensation of a lump in the back of his throat.   ROS:   Please see the history of present illness.    All other systems reviewed and are negative.     Past Medical History:  Diagnosis Date   Adenomatous colon polyp 12/31/2005   Agatston coronary artery calcium  score greater than 400    cor cal score 3673 on 03/2023   Aortic atherosclerosis    COVID-19 02/2020   Diabetes mellitus    Gastroesophageal reflux disease without esophagitis 01/21/2024   Hypercholesterolemia    Hypertension    Intracranial hemorrhage (HCC) 2020   secondary to hypertensive crisis   Osteoporosis of vertebra 04/11/2016   Renal artery stenosis    Left renal artery 1-59% by dopplers 10/2018   Stroke [facility])     Past Surgical History:  Procedure Laterality Date   ANAL FISTULECTOMY  10/02/2011   Procedure: FISTULECTOMY ANAL;  Surgeon: Debby LABOR. Cornett, MD;  Location: [facility];  Service: General;  Laterality: N/A;  excision perianal mass    APPENDECTOMY     COLONOSCOPY W/ POLYPECTOMY  12/31/2005   Dr. Lupita Commander   HERNIA REPAIR     Repaired 02/05/2022   INGUINAL HERNIA REPAIR Right 02/05/2022   Procedure: HERNIA REPAIR INGUINAL ADULT W/ MESH;  Surgeon: Evonnie Dorothyann LABOR, DO;  Location: [facility];  Service: General;  Laterality: Right;   UMBILICAL HERNIA REPAIR      Current Medications: Active Medications[1]   Allergies:   Crestor  [rosuvastatin  calcium ],  Dorethia Hollow ], and Benazepril    Social History   Socioeconomic History   Marital status: Married    Spouse name: Niels   Number of children: 3   Years of education: 14   Highest education level: 12th grade  Occupational History   Occupation: retired  Tobacco Use   Smoking status: Former    Current packs/day: 0.00    Average packs/day: 0.8 packs/day for 18.9 years (15.1 ttl pk-yrs)    Types: Cigarettes    Start date: 03/27/1963    Quit date: 03/26/1978    Years since quitting: 46.1   Smokeless  tobacco: Never  Vaping Use   Vaping status: Never Used  Substance and Sexual Activity   Alcohol use: Yes    Comment: cocktail once a month   Drug use: Never   Sexual activity: Yes  Other Topics Concern   Not on file  Social History Narrative   Very active and independent - trains bird dogs, raises horses, etc   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   Married 1 son, 2 daughters (son in [place])   grandkids and greatgrandkids   No EtOH, former smoker and no drugs   Social Drivers of Health   Tobacco Use: Medium Risk (04/28/2024)   Patient History    Smoking Tobacco Use: Former    Smokeless Tobacco Use: Never    Passive Exposure: Not on Actuary Strain: Low Risk (01/19/2024)   Overall Financial Resource Strain (CARDIA)    Difficulty of Paying Living Expenses: Not hard at all  Food Insecurity: No Food Insecurity (04/14/2024)   Epic    Worried About Radiation Protection Practitioner of Food in the Last Year: Never true    Ran Out of Food in the Last Year: Never true  Transportation Needs: No Transportation Needs (04/14/2024)   Epic    Lack of Transportation (Medical): No    Lack of Transportation (Non-Medical): No  Physical Activity: Sufficiently Active (04/14/2024)   Exercise Vital Sign    Days of Exercise per Week: 7 days    Minutes of Exercise per Session: 30 min  Stress: No Stress Concern Present (04/14/2024)   [employer] of Occupational Health - Occupational Stress Questionnaire    Feeling of Stress: Not at all  Social Connections: Socially Integrated (04/14/2024)   Social Connection and Isolation Panel    Frequency of Communication with Friends and Family: More than three times a week    Frequency of Social Gatherings with Friends and Family: More than three times a week    Attends Religious Services: 1 to 4 times per year    Active Member of Clubs or Organizations: Yes    Attends Banker Meetings: More than 4 times per year    Marital Status: Married  Depression (PHQ2-9): Low  Risk (04/14/2024)   Depression (PHQ2-9)    PHQ-2 Score: 0  Alcohol Screen: Low Risk (01/19/2024)   Alcohol Screen    Last Alcohol Screening Score (AUDIT): 1  Housing: Low Risk (04/14/2024)   Epic    Unable to Pay for Housing in the Last Year: No    Number of Times Moved in the Last Year: 0    Homeless in the Last Year: No  Utilities: Not At Risk (04/14/2024)   Epic    Threatened with loss of utilities: No  Health Literacy: Adequate Health Literacy (04/14/2024)   B1300 Health Literacy    Frequency of need for help with medical instructions: Never     Family History: The  patient's family history includes Cancer (age of onset: 34) in his father; Colon cancer (age of onset: 74) in his father; Colon polyps in his father; Diabetes (age of onset: 61) in his daughter; Heart attack in his maternal uncle; Heart disease in his maternal uncle, maternal uncle, and maternal uncle; Transient ischemic attack (age of onset: 35) in his brother.  EKGs/Labs/Other Studies Reviewed:    The following studies were reviewed today:  EKG Interpretation Date/Time:  Tuesday April 28 2024 08:32:24 EST Ventricular Rate:  52 PR Interval:  166 QRS Duration:  80 QT Interval:  398 QTC Calculation: 370 R Axis:   8  Text Interpretation: Sinus bradycardia  No significant change from prior studies Confirmed by Blakeleigh Domek [phone]) on 04/28/2024 8:34:10 AM    Recent Labs: 03/08/2024: ALT 21; BUN 33; Creatinine, Ser 1.93; Hemoglobin 12.5; Platelets 243; Potassium 4.6; Sodium 136  Recent Lipid Panel    Component Value Date/Time   CHOL 143 01/21/2024 0803   CHOL 165 10/02/2012 0834   TRIG 67 01/21/2024 0803   TRIG 115 03/09/2014 0818   TRIG 95 10/02/2012 0834   HDL 47 01/21/2024 0803   HDL 43 03/09/2014 0818   HDL 51 10/02/2012 0834   CHOLHDL 3.0 01/21/2024 0803   CHOLHDL 5.3 06/29/2018 1401   VLDL 35 06/29/2018 1401   LDLCALC 82 01/21/2024 0803   LDLCALC 223 (H) 09/10/2013 0905   LDLCALC 95 10/02/2012  0834     Risk Assessment/Calculations:      HYPERTENSION CONTROL Vitals:   04/28/24 0805 04/28/24 0820  BP: (!) 172/77 (!) 161/79    The patient's blood pressure is elevated above target today.  In order to address the patient's elevated BP: The blood pressure is usually elevated in clinic.  Blood pressures monitored at home have been optimal.; Blood pressure will be monitored at home to determine if medication changes need to be made.            Physical Exam:    VS:  BP (!) 161/79 (BP Location: Left Arm)   Pulse (!) 58   Resp 14   Wt 148 lb (67.1 kg)   SpO2 98%   BMI 22.50 kg/m        Wt Readings from Last 3 Encounters:  04/28/24 148 lb (67.1 kg)  04/14/24 148 lb 9.6 oz (67.4 kg)  03/08/24 140 lb (63.5 kg)     GEN:  Well nourished, well developed in no acute distress HEENT: Normal NECK:  No carotid bruits CARDIAC:  S1-S2 normal, RRR, no murmurs, rubs, gallops RESPIRATORY:  Clear to auscultation without rales, wheezing or rhonchi  MUSCULOSKELETAL:  No edema; No deformity  SKIN: Warm and dry NEUROLOGIC:  Alert and oriented x 3 PSYCHIATRIC:  Normal affect       Assessment & Plan Coronary artery disease involving native coronary artery of native heart without angina pectoris CAC score on 04/15/2023: 3673, 94th percentile, aortic atherosclerosis.   PET scan performed due to elevated calcium  score with left main involvement, 04/23/2023: Normal, low risk, no evidence of ischemia or infarction, resting EF 59%, no TID. EKG: Sinus bradycardia, 52 bpm, no significant change from prior studies He denies chest pain, shortness of breath, near-syncope No ASA 2/2 ICH Hx Continue with risk factor management, continue to monitor for new symptoms Continue atorvastatin  10 mg 3 times per week, Zetia  10 mg on alternating days Primary hypertension Checks his BP regularly throughout the week, typically averaging <130 systolic Reports a history of whitecoat  hypertension, BP  elevated on arrival, improving throughout visit, also states he had a cup of coffee directly before visit. Continue amlodipine  10 mg daily, hydralazine  25 mg 3 times daily, losartan  100 mg daily Kidney function recently appears to have declined at ED visit 1 month prior, will recheck BMP today Patient is advised to keep a blood pressure log and notify the office for BPs consistently averaging above goal of <130/80 Hyperlipidemia, unspecified hyperlipidemia type Noted to be previously statin intolerant, able to tolerate low doses throughout the week Patient is taking his atorvastatin  2-3 times per week, with Zetia  on alternating days. States if he takes either drug more frequently he begins to get muscle aches, but can tolerate this regimen LDL has historically ranged from 70-80, most recently 82 in October 2025 Continue meds as prescribed Dysphagia, unspecified type Reports a history of frequent swallowing, sensation of lump in his throat, postnasal drip, that has previously been attributed to his GERD.  Advised patient to follow-up with PCP to consider ENT or GI referral for possible swallow study or further imaging if necessary.  Disposition: Follow-up in 1 year with Dr. Shlomo, or sooner if needed.  Notify the office for blood pressures consistently over goal as stated above.  Proceed to the emergency department with any new or worsening symptoms.            Medication Adjustments/Labs and Tests Ordered: Current medicines are reviewed at length with the patient today.  Concerns regarding medicines are outlined above.  Orders Placed This Encounter  Procedures   Basic Metabolic Panel (BMET)   EKG 12-Lead   No orders of the defined types were placed in this encounter.   Patient Instructions  Medication Instructions:  Your physician recommends that you continue on your current medications as directed. Please refer to the Current Medication list given to you today.  *If you need a  refill on your cardiac medications before your next appointment, please call your pharmacy*  Lab Work: BMP Today If you have labs (blood work) drawn today and your tests are completely normal, you will receive your results only by: MyChart Message (if you have MyChart) OR A paper copy in the mail If you have any lab test that is abnormal or we need to change your treatment, we will call you to review the results.  Testing/Procedures: NONE ORDERED  Follow-Up: At [facility], you and your health needs are our priority.  As part of our continuing mission to provide you with exceptional heart care, our providers are all part of one team.  This team includes your primary Cardiologist (physician) and Advanced Practice Providers or APPs (Physician Assistants and Nurse Practitioners) who all work together to provide you with the care you need, when you need it.  Your next appointment:   1 year(s)  Provider:   Wilbert Shlomo, MD    We recommend signing up for the patient portal called MyChart.  Sign up information is provided on this After Visit Summary.  MyChart is used to connect with patients for Virtual Visits (Telemedicine).  Patients are able to view lab/test results, encounter notes, upcoming appointments, etc.  Non-urgent messages can be sent to your provider as well.   To learn more about what you can do with MyChart, go to [email or web address].   Other Instructions Monitor blood pressure             Signed, Cinnamon Morency E Karyss Frese, NP  04/28/2024 8:35 AM    [facility]     [  1]  No outpatient medications have been marked as taking for the 04/28/24 encounter (Office Visit) with Tomoki Lucken E, NP.   "

## 2024-04-28 ENCOUNTER — Encounter: Payer: Self-pay | Admitting: Emergency Medicine

## 2024-04-28 ENCOUNTER — Ambulatory Visit: Admitting: Emergency Medicine

## 2024-04-28 VITALS — BP 161/79 | HR 52 | Resp 14 | Ht 68.0 in | Wt 148.0 lb

## 2024-04-28 DIAGNOSIS — I10 Essential (primary) hypertension: Secondary | ICD-10-CM | POA: Diagnosis not present

## 2024-04-28 DIAGNOSIS — I2510 Atherosclerotic heart disease of native coronary artery without angina pectoris: Secondary | ICD-10-CM | POA: Diagnosis not present

## 2024-04-28 DIAGNOSIS — R1310 Dysphagia, unspecified: Secondary | ICD-10-CM | POA: Diagnosis not present

## 2024-04-28 DIAGNOSIS — E785 Hyperlipidemia, unspecified: Secondary | ICD-10-CM

## 2024-04-28 LAB — BASIC METABOLIC PANEL WITH GFR
BUN/Creatinine Ratio: 19 (ref 10–24)
BUN: 35 mg/dL — ABNORMAL HIGH (ref 8–27)
CO2: 21 mmol/L (ref 20–29)
Calcium: 9.6 mg/dL (ref 8.6–10.2)
Chloride: 103 mmol/L (ref 96–106)
Creatinine, Ser: 1.86 mg/dL — ABNORMAL HIGH (ref 0.76–1.27)
Glucose: 137 mg/dL — ABNORMAL HIGH (ref 70–99)
Potassium: 4.7 mmol/L (ref 3.5–5.2)
Sodium: 139 mmol/L (ref 134–144)
eGFR: 37 mL/min/{1.73_m2} — ABNORMAL LOW

## 2024-04-28 NOTE — Assessment & Plan Note (Signed)
 Checks his BP regularly throughout the week, typically averaging <130 systolic Reports a history of whitecoat hypertension, BP elevated on arrival, improving throughout visit, also states he had a cup of coffee directly before visit. Continue amlodipine  10 mg daily, hydralazine  25 mg 3 times daily, losartan  100 mg daily Kidney function recently appears to have declined at ED visit 1 month prior, will recheck BMP today Patient is advised to keep a blood pressure log and notify the office for BPs consistently averaging above goal of <130/80

## 2024-04-29 ENCOUNTER — Ambulatory Visit: Payer: Self-pay | Admitting: Emergency Medicine

## 2024-07-21 ENCOUNTER — Ambulatory Visit: Payer: Self-pay | Admitting: Nurse Practitioner
